# Patient Record
Sex: Female | Born: 1970
Health system: Southern US, Community
[De-identification: ages and names within clinical notes are randomized; demographics above are authoritative.]

## PROBLEM LIST (undated history)

## (undated) DIAGNOSIS — N3281 Overactive bladder: Secondary | ICD-10-CM

## (undated) DIAGNOSIS — K760 Fatty (change of) liver, not elsewhere classified: Secondary | ICD-10-CM

## (undated) DIAGNOSIS — M5136 Other intervertebral disc degeneration, lumbar region: Secondary | ICD-10-CM

## (undated) DIAGNOSIS — K449 Diaphragmatic hernia without obstruction or gangrene: Secondary | ICD-10-CM

## (undated) DIAGNOSIS — J45909 Unspecified asthma, uncomplicated: Secondary | ICD-10-CM

## (undated) DIAGNOSIS — D649 Anemia, unspecified: Secondary | ICD-10-CM

## (undated) DIAGNOSIS — M51369 Other intervertebral disc degeneration, lumbar region without mention of lumbar back pain or lower extremity pain: Secondary | ICD-10-CM

## (undated) DIAGNOSIS — G43909 Migraine, unspecified, not intractable, without status migrainosus: Secondary | ICD-10-CM

## (undated) DIAGNOSIS — Z9289 Personal history of other medical treatment: Secondary | ICD-10-CM

## (undated) DIAGNOSIS — E559 Vitamin D deficiency, unspecified: Secondary | ICD-10-CM

## (undated) DIAGNOSIS — N719 Inflammatory disease of uterus, unspecified: Secondary | ICD-10-CM

## (undated) DIAGNOSIS — F3281 Premenstrual dysphoric disorder: Secondary | ICD-10-CM

## (undated) DIAGNOSIS — M719 Bursopathy, unspecified: Secondary | ICD-10-CM

## (undated) DIAGNOSIS — T7840XA Allergy, unspecified, initial encounter: Secondary | ICD-10-CM

## (undated) DIAGNOSIS — D069 Carcinoma in situ of cervix, unspecified: Secondary | ICD-10-CM

## (undated) DIAGNOSIS — E785 Hyperlipidemia, unspecified: Secondary | ICD-10-CM

## (undated) DIAGNOSIS — N12 Tubulo-interstitial nephritis, not specified as acute or chronic: Secondary | ICD-10-CM

## (undated) DIAGNOSIS — C801 Malignant (primary) neoplasm, unspecified: Secondary | ICD-10-CM

## (undated) DIAGNOSIS — K219 Gastro-esophageal reflux disease without esophagitis: Secondary | ICD-10-CM

## (undated) DIAGNOSIS — Z973 Presence of spectacles and contact lenses: Secondary | ICD-10-CM

## (undated) DIAGNOSIS — Z01419 Encounter for gynecological examination (general) (routine) without abnormal findings: Secondary | ICD-10-CM

## (undated) HISTORY — DX: Gastro-esophageal reflux disease without esophagitis: K21.9

## (undated) HISTORY — DX: Premenstrual dysphoric disorder: F32.81

## (undated) HISTORY — DX: Fatty (change of) liver, not elsewhere classified: K76.0

## (undated) HISTORY — DX: Personal history of other medical treatment: Z92.89

## (undated) HISTORY — DX: Migraine, unspecified, not intractable, without status migrainosus: G43.909

## (undated) HISTORY — PX: LASIK: SHX215

## (undated) HISTORY — PX: FOOT SURGERY: SHX648

## (undated) HISTORY — DX: Overactive bladder: N32.81

## (undated) HISTORY — DX: Presence of spectacles and contact lenses: Z97.3

## (undated) HISTORY — DX: Anemia, unspecified: D64.9

## (undated) HISTORY — DX: Encounter for gynecological examination (general) (routine) without abnormal findings: Z01.419

## (undated) HISTORY — DX: Diaphragmatic hernia without obstruction or gangrene: K44.9

## (undated) HISTORY — PX: CHOLECYSTECTOMY: SHX55

## (undated) HISTORY — DX: Vitamin D deficiency, unspecified: E55.9

## (undated) HISTORY — PX: WISDOM TOOTH EXTRACTION: SHX21

## (undated) HISTORY — PX: LUMBAR DISC SURGERY: SHX700

## (undated) HISTORY — DX: Tubulo-interstitial nephritis, not specified as acute or chronic: N12

## (undated) HISTORY — DX: Unspecified asthma, uncomplicated: J45.909

## (undated) HISTORY — DX: Allergy, unspecified, initial encounter: T78.40XA

## (undated) HISTORY — DX: Other intervertebral disc degeneration, lumbar region without mention of lumbar back pain or lower extremity pain: M51.369

## (undated) HISTORY — DX: Inflammatory disease of uterus, unspecified: N71.9

## (undated) HISTORY — DX: Bursopathy, unspecified: M71.9

## (undated) HISTORY — DX: Hyperlipidemia, unspecified: E78.5

## (undated) HISTORY — DX: Other intervertebral disc degeneration, lumbar region: M51.36

## (undated) HISTORY — PX: NOVASURE ABLATION: SHX5394

---

## 1997-10-18 ENCOUNTER — Other Ambulatory Visit: Admission: RE | Admit: 1997-10-18 | Discharge: 1997-10-18 | Payer: Self-pay | Admitting: Obstetrics and Gynecology

## 1997-12-14 ENCOUNTER — Ambulatory Visit (HOSPITAL_COMMUNITY): Admission: RE | Admit: 1997-12-14 | Discharge: 1997-12-14 | Payer: Self-pay | Admitting: Obstetrics and Gynecology

## 1998-03-01 ENCOUNTER — Ambulatory Visit (HOSPITAL_COMMUNITY): Admission: RE | Admit: 1998-03-01 | Discharge: 1998-03-01 | Payer: Self-pay | Admitting: Obstetrics and Gynecology

## 1998-04-04 ENCOUNTER — Inpatient Hospital Stay (HOSPITAL_COMMUNITY): Admission: AD | Admit: 1998-04-04 | Discharge: 1998-04-06 | Payer: Self-pay | Admitting: Obstetrics and Gynecology

## 1998-04-06 ENCOUNTER — Inpatient Hospital Stay (HOSPITAL_COMMUNITY): Admission: AD | Admit: 1998-04-06 | Discharge: 1998-04-06 | Payer: Self-pay | Admitting: Obstetrics and Gynecology

## 1998-04-27 ENCOUNTER — Inpatient Hospital Stay (HOSPITAL_COMMUNITY): Admission: AD | Admit: 1998-04-27 | Discharge: 1998-04-27 | Payer: Self-pay

## 1998-05-01 ENCOUNTER — Inpatient Hospital Stay (HOSPITAL_COMMUNITY): Admission: AD | Admit: 1998-05-01 | Discharge: 1998-05-01 | Payer: Self-pay | Admitting: Obstetrics and Gynecology

## 1998-05-02 ENCOUNTER — Inpatient Hospital Stay (HOSPITAL_COMMUNITY): Admission: AD | Admit: 1998-05-02 | Discharge: 1998-05-02 | Payer: Self-pay | Admitting: Obstetrics and Gynecology

## 1998-05-02 ENCOUNTER — Inpatient Hospital Stay (HOSPITAL_COMMUNITY): Admission: AD | Admit: 1998-05-02 | Discharge: 1998-05-04 | Payer: Self-pay | Admitting: Obstetrics and Gynecology

## 1998-05-06 ENCOUNTER — Encounter (HOSPITAL_COMMUNITY): Admission: RE | Admit: 1998-05-06 | Discharge: 1998-08-04 | Payer: Self-pay | Admitting: Obstetrics and Gynecology

## 1999-02-06 ENCOUNTER — Ambulatory Visit (HOSPITAL_COMMUNITY): Admission: RE | Admit: 1999-02-06 | Discharge: 1999-02-06 | Payer: Self-pay | Admitting: Family Medicine

## 1999-02-07 ENCOUNTER — Encounter: Payer: Self-pay | Admitting: Family Medicine

## 1999-05-30 ENCOUNTER — Encounter: Payer: Self-pay | Admitting: Emergency Medicine

## 1999-05-30 ENCOUNTER — Emergency Department (HOSPITAL_COMMUNITY): Admission: EM | Admit: 1999-05-30 | Discharge: 1999-05-30 | Payer: Self-pay | Admitting: Emergency Medicine

## 2001-03-29 ENCOUNTER — Other Ambulatory Visit: Admission: RE | Admit: 2001-03-29 | Discharge: 2001-03-29 | Payer: Self-pay | Admitting: Obstetrics and Gynecology

## 2001-06-20 ENCOUNTER — Ambulatory Visit (HOSPITAL_COMMUNITY): Admission: RE | Admit: 2001-06-20 | Discharge: 2001-06-20 | Payer: Self-pay | Admitting: Neurological Surgery

## 2001-06-20 ENCOUNTER — Encounter: Payer: Self-pay | Admitting: Neurological Surgery

## 2001-08-15 ENCOUNTER — Ambulatory Visit (HOSPITAL_COMMUNITY): Admission: RE | Admit: 2001-08-15 | Discharge: 2001-08-15 | Payer: Self-pay | Admitting: Neurological Surgery

## 2001-08-15 ENCOUNTER — Encounter: Payer: Self-pay | Admitting: Neurological Surgery

## 2001-08-25 ENCOUNTER — Ambulatory Visit (HOSPITAL_COMMUNITY): Admission: RE | Admit: 2001-08-25 | Discharge: 2001-08-25 | Payer: Self-pay | Admitting: Neurological Surgery

## 2001-08-25 ENCOUNTER — Encounter: Payer: Self-pay | Admitting: Neurological Surgery

## 2002-01-16 ENCOUNTER — Other Ambulatory Visit: Admission: RE | Admit: 2002-01-16 | Discharge: 2002-01-16 | Payer: Self-pay | Admitting: Obstetrics and Gynecology

## 2003-01-18 ENCOUNTER — Other Ambulatory Visit: Admission: RE | Admit: 2003-01-18 | Discharge: 2003-01-18 | Payer: Self-pay | Admitting: Obstetrics and Gynecology

## 2003-07-17 ENCOUNTER — Emergency Department (HOSPITAL_COMMUNITY): Admission: EM | Admit: 2003-07-17 | Discharge: 2003-07-17 | Payer: Self-pay | Admitting: Emergency Medicine

## 2004-03-12 ENCOUNTER — Other Ambulatory Visit: Admission: RE | Admit: 2004-03-12 | Discharge: 2004-03-12 | Payer: Self-pay | Admitting: Obstetrics and Gynecology

## 2005-01-15 ENCOUNTER — Other Ambulatory Visit: Admission: RE | Admit: 2005-01-15 | Discharge: 2005-01-15 | Payer: Self-pay | Admitting: Obstetrics and Gynecology

## 2006-01-28 ENCOUNTER — Other Ambulatory Visit: Admission: RE | Admit: 2006-01-28 | Discharge: 2006-01-28 | Payer: Self-pay | Admitting: Obstetrics and Gynecology

## 2007-06-04 ENCOUNTER — Ambulatory Visit (HOSPITAL_COMMUNITY): Admission: RE | Admit: 2007-06-04 | Discharge: 2007-06-04 | Payer: Self-pay | Admitting: Neurology

## 2008-03-07 ENCOUNTER — Emergency Department (HOSPITAL_COMMUNITY): Admission: EM | Admit: 2008-03-07 | Discharge: 2008-03-07 | Payer: Self-pay | Admitting: Family Medicine

## 2008-03-17 ENCOUNTER — Ambulatory Visit (HOSPITAL_COMMUNITY): Admission: RE | Admit: 2008-03-17 | Discharge: 2008-03-17 | Payer: Self-pay | Admitting: Family Medicine

## 2008-08-07 ENCOUNTER — Emergency Department (HOSPITAL_COMMUNITY): Admission: EM | Admit: 2008-08-07 | Discharge: 2008-08-07 | Payer: Self-pay | Admitting: Emergency Medicine

## 2008-11-13 ENCOUNTER — Ambulatory Visit: Payer: Self-pay | Admitting: Sports Medicine

## 2008-11-13 DIAGNOSIS — IMO0002 Reserved for concepts with insufficient information to code with codable children: Secondary | ICD-10-CM | POA: Insufficient documentation

## 2008-11-13 DIAGNOSIS — M5137 Other intervertebral disc degeneration, lumbosacral region: Secondary | ICD-10-CM | POA: Insufficient documentation

## 2008-11-14 ENCOUNTER — Ambulatory Visit (HOSPITAL_COMMUNITY): Admission: RE | Admit: 2008-11-14 | Discharge: 2008-11-14 | Payer: Self-pay | Admitting: Sports Medicine

## 2008-11-14 ENCOUNTER — Telehealth: Payer: Self-pay | Admitting: Sports Medicine

## 2008-11-15 ENCOUNTER — Telehealth: Payer: Self-pay | Admitting: Sports Medicine

## 2008-11-27 ENCOUNTER — Encounter: Admission: RE | Admit: 2008-11-27 | Discharge: 2008-11-27 | Payer: Self-pay | Admitting: Internal Medicine

## 2008-12-10 ENCOUNTER — Emergency Department (HOSPITAL_COMMUNITY): Admission: EM | Admit: 2008-12-10 | Discharge: 2008-12-10 | Payer: Self-pay | Admitting: Family Medicine

## 2009-01-30 ENCOUNTER — Ambulatory Visit (HOSPITAL_COMMUNITY): Admission: RE | Admit: 2009-01-30 | Discharge: 2009-01-30 | Payer: Self-pay | Admitting: Family Medicine

## 2009-02-19 ENCOUNTER — Ambulatory Visit (HOSPITAL_COMMUNITY): Admission: RE | Admit: 2009-02-19 | Discharge: 2009-02-19 | Payer: Self-pay | Admitting: Gastroenterology

## 2009-02-28 ENCOUNTER — Ambulatory Visit (HOSPITAL_COMMUNITY): Admission: RE | Admit: 2009-02-28 | Discharge: 2009-02-28 | Payer: Self-pay | Admitting: Gastroenterology

## 2009-04-02 ENCOUNTER — Ambulatory Visit (HOSPITAL_COMMUNITY): Admission: RE | Admit: 2009-04-02 | Discharge: 2009-04-02 | Payer: Self-pay | Admitting: Gastroenterology

## 2009-05-27 ENCOUNTER — Ambulatory Visit (HOSPITAL_COMMUNITY): Admission: RE | Admit: 2009-05-27 | Discharge: 2009-05-27 | Payer: Self-pay | Admitting: Surgery

## 2009-06-01 HISTORY — PX: FOOT SURGERY: SHX648

## 2009-06-01 HISTORY — PX: CHOLECYSTECTOMY: SHX55

## 2009-07-11 ENCOUNTER — Ambulatory Visit (HOSPITAL_COMMUNITY): Admission: RE | Admit: 2009-07-11 | Discharge: 2009-07-11 | Payer: Self-pay | Admitting: Family Medicine

## 2009-08-09 ENCOUNTER — Ambulatory Visit (HOSPITAL_COMMUNITY): Admission: RE | Admit: 2009-08-09 | Discharge: 2009-08-09 | Payer: Self-pay | Admitting: Orthopedic Surgery

## 2009-08-30 ENCOUNTER — Emergency Department (HOSPITAL_COMMUNITY): Admission: EM | Admit: 2009-08-30 | Discharge: 2009-08-30 | Payer: Self-pay | Admitting: Family Medicine

## 2010-02-25 ENCOUNTER — Ambulatory Visit: Payer: Self-pay | Admitting: Sports Medicine

## 2010-06-01 DIAGNOSIS — E785 Hyperlipidemia, unspecified: Secondary | ICD-10-CM

## 2010-06-01 HISTORY — DX: Hyperlipidemia, unspecified: E78.5

## 2010-07-03 NOTE — Assessment & Plan Note (Signed)
Summary: BACK PAIN,MC   History of Present Illness:  Here for follow up of Low back.  Seen over a year ago.  Has DDD of L3-L5.   for past month, has had worsening R sided low back pain with mild redicular symptoms  - worse with sitting for prolonged periods  - also, biggest problem is sleeping at night.  laying down causes a lot of pain.  only getting about 4-5 hours a night.    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    - no trauma or injury at onset of this pain.   Problems Prior to Update: 1)  Degenerative Disc Disease, Lumbar Spine  (ICD-722.52) 2)  Back Pain With Radiculopathy  (ICD-729.2)  Physical Exam  Msk:     Detailed Back/Spine Exam  Lumbosacral Exam:  Inspection-deformity:    Normal Palpation-spinal tenderness:     paraspinal tenderness on R lumbar Squatting:      squatting slowed and painful.  Lying Straight Leg Raise:    Right:  negative    Left:  negative Sitting Straight Leg Raise:    Right:  negative    Left:  negative Reverse Straight Leg Raise:    Right:  negative    Left:  negative Contralateral Straight Leg Raise:    Right:  negative    Left:  negative Toe Walking:    Right:  normal    Left:  normal Heel Walking:    Right:  normal    Left:  normal Fabere Test:    Right:  positive    Left:  negative     restricted ROM on r side with decreased Faber, hamstring flexibility, internal / external rotation.   Reflexes - 2+ S1 bilaterally     Deminished / non-existant L4 (patellar) bilaterally.  slightly flicker on R knee, none on L knee.     Impression & Recommendations:  Problem # 1:  BACK PAIN WITH RADICULOPATHY (ICD-729.2) Assessment Deteriorated 40 yo F with degenerative changes of L3-L5.  New flare of Redicular back pain for past 4-6 weeks.  Patient with no weakness or incontinence.  Strenght testing is appropriate, but deminished  patellar reflexes.  Discussed surgical consideration, and patient would like to pursue further medical management.     Given her muscular restrictions, would like to refer to PT for strenghtening and flexibility of hips, back.  Tramadol for pain, and will start amitryptiline at bedtime for sleep and muscle relaxation.    we should reck after about 8 PT sessions  Complete Medication List: 1)  Tramadol Hcl 50 Mg Tabs (Tramadol hcl) .[name].. 1 by mouth qid as needed pain 2)  Amitriptyline Hcl 10 Mg Tabs (Amitriptyline hcl) .[name].. 1 by mouth at bedtime x 3 days; then 2 by mouth qhs Prescriptions: AMITRIPTYLINE HCL 10 MG TABS (AMITRIPTYLINE HCL) 1 by mouth at bedtime x 3 days; then 2 by mouth qhs  #60 x 2   Entered and Authorized by:   Enid Baas MD   Signed by:   Enid Baas MD on 02/25/2010   Method used:   Electronically to        [facility]* (retail)       [address]       [address] Shipping[address]mailing       [place], [place]  [address]       Ph: [phone]  Fax: [phone]   RxID:   [id number] TRAMADOL HCL 50 MG TABS (TRAMADOL HCL) 1 by mouth qid as needed pain  #100 x 2   Entered and Authorized by:   Enid Baas MD   Signed by:   Enid Baas MD on 02/25/2010   Method used:   Electronically to        [facility]* (retail)       [address]       [address]mail[address]       [place], [place]  [address]       Ph: [phone]       Fax: [phone]   RxID:   [id number]    Referral faxed to [facility] on [address] Rochele Pages, RN

## 2010-08-20 ENCOUNTER — Other Ambulatory Visit (HOSPITAL_COMMUNITY): Payer: Self-pay | Admitting: Orthopedic Surgery

## 2010-08-20 DIAGNOSIS — M79672 Pain in left foot: Secondary | ICD-10-CM

## 2010-08-26 ENCOUNTER — Ambulatory Visit (HOSPITAL_COMMUNITY)
Admission: RE | Admit: 2010-08-26 | Discharge: 2010-08-26 | Disposition: A | Discharge status: Home / Self Care | Payer: Commercial Managed Care - PPO | Source: Ambulatory Visit | Attending: Orthopedic Surgery | Admitting: Orthopedic Surgery

## 2010-08-26 DIAGNOSIS — M79672 Pain in left foot: Secondary | ICD-10-CM

## 2010-08-26 DIAGNOSIS — M79609 Pain in unspecified limb: Secondary | ICD-10-CM | POA: Insufficient documentation

## 2010-08-26 DIAGNOSIS — R609 Edema, unspecified: Secondary | ICD-10-CM | POA: Insufficient documentation

## 2010-09-01 LAB — DIFFERENTIAL
Basophils Absolute: 0 10*3/uL (ref 0.0–0.1)
Eosinophils Absolute: 0 10*3/uL (ref 0.0–0.7)
Eosinophils Relative: 1 % (ref 0–5)
Lymphocytes Relative: 28 % (ref 12–46)
Lymphs Abs: 1.5 10*3/uL (ref 0.7–4.0)
Monocytes Absolute: 0.6 10*3/uL (ref 0.1–1.0)
Neutro Abs: 3.3 10*3/uL (ref 1.7–7.7)
Neutrophils Relative %: 61 % (ref 43–77)

## 2010-09-01 LAB — CBC
MCHC: 34.5 g/dL (ref 30.0–36.0)
Platelets: 209 10*3/uL (ref 150–400)
RDW: 12.6 % (ref 11.5–15.5)

## 2010-09-01 LAB — COMPREHENSIVE METABOLIC PANEL
ALT: 24 U/L (ref 0–35)
Alkaline Phosphatase: 73 U/L (ref 39–117)
BUN: 9 mg/dL (ref 6–23)
GFR calc non Af Amer: >60 mL/min (ref >60)
Total Bilirubin: 0.8 mg/dL (ref 0.3–1.2)
Total Protein: 7.5 g/dL (ref 6.0–8.3)

## 2010-09-01 LAB — URINALYSIS, ROUTINE W REFLEX MICROSCOPIC
Bilirubin Urine: NEGATIVE
Glucose, UA: NEGATIVE mg/dL
Nitrite: NEGATIVE
Protein, ur: NEGATIVE mg/dL
Urobilinogen, UA: 0.2 mg/dL (ref 0.0–1.0)
pH: 6.5 (ref 5.0–8.0)

## 2010-09-01 LAB — URINE MICROSCOPIC-ADD ON

## 2010-09-23 ENCOUNTER — Encounter: Payer: Self-pay | Admitting: *Deleted

## 2010-11-12 ENCOUNTER — Ambulatory Visit (HOSPITAL_COMMUNITY)
Admission: RE | Admit: 2010-11-12 | Discharge: 2010-11-12 | Disposition: A | Discharge status: Home / Self Care | Payer: 59 | Source: Ambulatory Visit | Attending: Orthopedic Surgery | Admitting: Orthopedic Surgery

## 2010-11-12 ENCOUNTER — Encounter (HOSPITAL_COMMUNITY)
Admission: RE | Admit: 2010-11-12 | Discharge: 2010-11-12 | Disposition: A | Discharge status: Home / Self Care | Payer: 59 | Source: Ambulatory Visit | Attending: Orthopedic Surgery | Admitting: Orthopedic Surgery

## 2010-11-12 ENCOUNTER — Other Ambulatory Visit (HOSPITAL_COMMUNITY): Payer: Self-pay | Admitting: Orthopedic Surgery

## 2010-11-12 DIAGNOSIS — R059 Cough, unspecified: Secondary | ICD-10-CM | POA: Insufficient documentation

## 2010-11-12 DIAGNOSIS — Z01810 Encounter for preprocedural cardiovascular examination: Secondary | ICD-10-CM | POA: Insufficient documentation

## 2010-11-12 DIAGNOSIS — R05 Cough: Secondary | ICD-10-CM | POA: Insufficient documentation

## 2010-11-12 DIAGNOSIS — Z01812 Encounter for preprocedural laboratory examination: Secondary | ICD-10-CM | POA: Insufficient documentation

## 2010-11-12 DIAGNOSIS — Z01811 Encounter for preprocedural respiratory examination: Secondary | ICD-10-CM | POA: Insufficient documentation

## 2010-11-12 DIAGNOSIS — G5760 Lesion of plantar nerve, unspecified lower limb: Secondary | ICD-10-CM | POA: Insufficient documentation

## 2010-11-12 DIAGNOSIS — R0789 Other chest pain: Secondary | ICD-10-CM | POA: Insufficient documentation

## 2010-11-12 LAB — COMPREHENSIVE METABOLIC PANEL
ALT: 30 U/L (ref 0–35)
Albumin: 3.7 g/dL (ref 3.5–5.2)
Alkaline Phosphatase: 91 U/L (ref 39–117)
Calcium: 9.1 mg/dL (ref 8.4–10.5)
GFR calc Af Amer: >60 mL/min (ref >60)
Potassium: 4.1 mEq/L (ref 3.5–5.1)
Sodium: 138 mEq/L (ref 135–145)
Total Protein: 6.7 g/dL (ref 6.0–8.3)

## 2010-11-12 LAB — CBC
HCT: 38 % (ref 36.0–46.0)
MCH: 29.9 pg (ref 26.0–34.0)
RBC: 4.38 MIL/uL (ref 3.87–5.11)
RDW: 12.7 % (ref 11.5–15.5)

## 2010-11-12 LAB — SURGICAL PCR SCREEN: Staphylococcus aureus: POSITIVE — AB

## 2010-11-14 ENCOUNTER — Ambulatory Visit (HOSPITAL_COMMUNITY)
Admission: RE | Admit: 2010-11-14 | Discharge: 2010-11-14 | Disposition: A | Discharge status: Home / Self Care | Payer: 59 | Source: Ambulatory Visit | Attending: Orthopedic Surgery | Admitting: Orthopedic Surgery

## 2010-11-14 DIAGNOSIS — Z01812 Encounter for preprocedural laboratory examination: Secondary | ICD-10-CM | POA: Insufficient documentation

## 2010-11-14 DIAGNOSIS — Z01810 Encounter for preprocedural cardiovascular examination: Secondary | ICD-10-CM | POA: Insufficient documentation

## 2010-11-14 DIAGNOSIS — G5760 Lesion of plantar nerve, unspecified lower limb: Secondary | ICD-10-CM | POA: Insufficient documentation

## 2010-12-10 NOTE — Op Note (Signed)
  NAMESHILO, PHILIPSON NO.:  [id number]  MEDICAL RECORD NO.:  [id number]  LOCATION:  [facility]                         FACILITY:  [facility]  PHYSICIAN:  Nadara Mustard, MD     DATE OF BIRTH:  02/26/1971  DATE OF PROCEDURE:  11/14/2010 DATE OF DISCHARGE:                              OPERATIVE REPORT   PREOPERATIVE DIAGNOSIS:  Morton neuroma, left foot third webspace.  POSTOPERATIVE DIAGNOSIS:  Morton neuroma, left foot third webspace.  PROCEDURE:  Excision of Morton neuroma, left foot third webspace.  SURGEON:  Nadara Mustard, MD  ANESTHESIA:  Popliteal block.  ESTIMATED BLOOD LOSS:  Minimal.  ANTIBIOTICS:  2 g of Kefzol.  DRAINS:  None.  COMPLICATIONS:  None.  TOURNIQUET TIME:  None.  DISPOSITION:  To PACU in stable condition.  INDICATIONS FOR PROCEDURE:  The patient is a 40 year old woman with a large neuroma confirmed by MRI scan of left foot third webspace.  She has failed conservative treatment including injections, shoe wear modification, has pain with activities of daily living and presents at this time for surgical intervention.  Risks and benefits were discussed including infection, neurovascular injury, recurrent pain, need for additional surgery.  The patient states she understands and wished to proceed at this time.  DESCRIPTION OF PROCEDURE:  The patient underwent a popliteal block in the holding area and then was brought to OR room 10.  After adequate level of anesthesia was obtained, the patient's left lower extremity was prepped using DuraPrep and draped into a sterile field.  An incision was made dorsally over the third webspace.  Blunt dissection was carried down.  The inner metatarsal ligament was incised.  The neuroma was delivered to the surgical field.  This was grasped with a retractor and the proximal limbs and distal limbs were dissected out and cut.  The patient had a rather large neuroma that was 5 mm in diameter.  The  wound was irrigated with normal saline. Hemostasis was obtained.  The incision was closed using a 3-0 nylon with modified vertical mattress suture.  The wounds were covered with Adaptic, orthopedic sponges, Webril, and Coban.  The patient was then taken to PACU in stable condition.  Prescription for Vicodin for pain. Follow up in the office in 2 weeks.     Nadara Mustard, MD     MVD/MEDQ  D:  11/14/2010  T:  11/15/2010  Job:  [id number]  Electronically Signed by Aldean Baker MD on 12/10/2010 09:38:32 AM

## 2010-12-19 IMAGING — NM NM HEPATO W/GB/PHARM/[PERSON_NAME]
2 series · 7 of 7 positions shown · non-contrast
Comparison: 01/30/2009 study

CLINICAL DATA: History of abdominal pain and nausea.

NUCLEAR MEDICINE HEPATOBILIARY IMAGING WITH GALLBLADDER EF
TECHNIQUE: Sequential images of the abdomen were obtained [DATE]
minutes following intravenous administration of
radiopharmaceutical. After slow intravenous infusion of 1.8 uCg
Cholecystokinin, gallbladder ejection fraction was determined.
Radiopharmaceutical: 5 mCi 8c-YYm Choletec

[he hepatobiliary · 3.43mm/px · 6 of 54 frames shown (1 of 2)]
[frame 5/54]
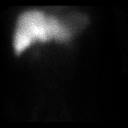
[frame 14/54]
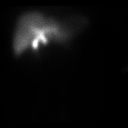
[frame 23/54]
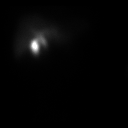
[frame 32/54]
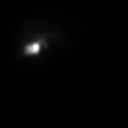
[frame 41/54]
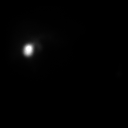
[frame 50/54]
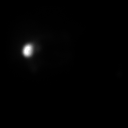

[he hepatobiliary · 1 of 1 slices shown (2 of 2)]
[im 1/1]
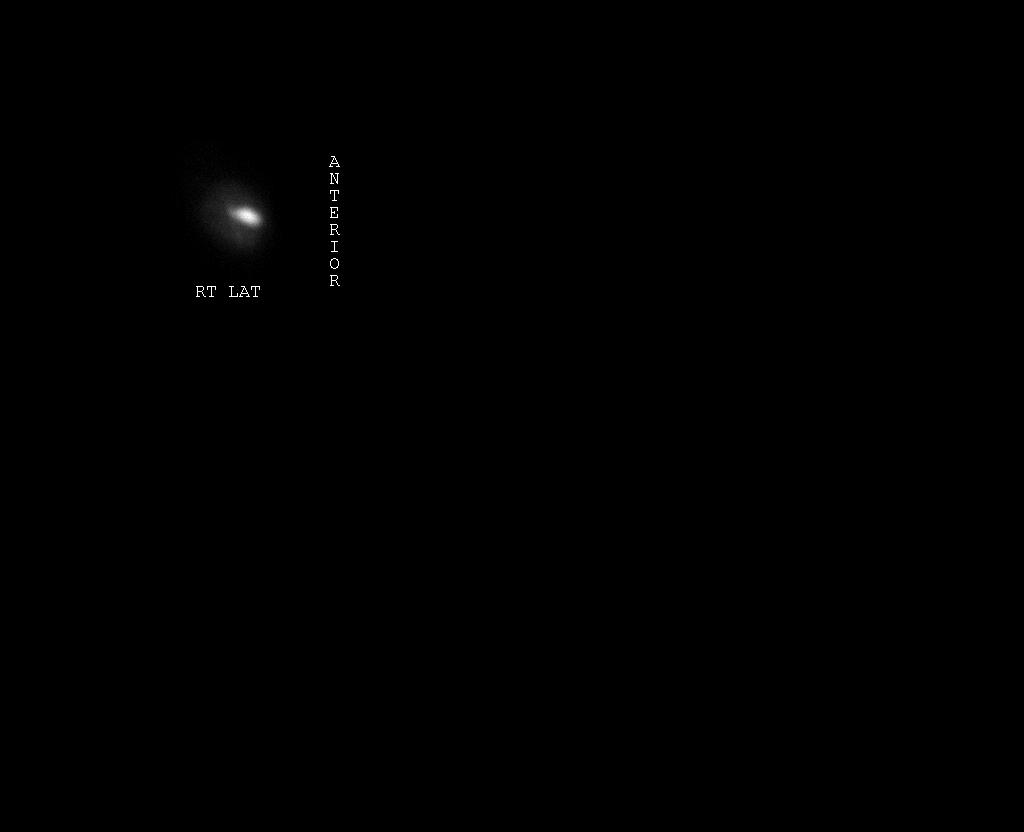

[7 of 7 positions shown; findings below may reference images not displayed]

FINDINGS: There is prompt visualization of hepatic activity. There
is prompt visualization of the common bile duct. Subsequently
intestinal activity was identified.

The gallbladder began to visualize at 15 minutes.

During CCK infusion the gallbladder contracted 15%. 30% or greater
is normal range.

During CCK infusion the patient reported upper abdominal pain.
IMPRESSION: There is demonstration of patency of the common bile duct and the
cystic duct. There is no evidence of cholecystitis.

During CCK infusion the gallbladder contracted 15%.  This is
abnormally low. 30% or greater is normal range.

During CCK infusion the patient reported upper abdominal pain.

## 2010-12-28 IMAGING — CR DG UGI W/ SMALL BOWEL HIGH DENSITY
1 series · 1 of 1 positions shown · IV contrast (agent unspecified)
Comparison: None

CLINICAL DATA: History given of right upper quadrant abdominal pain.
History given of pain in the mid back.  History given of nausea,
vomiting, constipation, and diarrhea.

UPPER GI W/ SMALL BOWEL HIGH DENSITY
TECHNIQUE: Upper GI series performed with high density barium and
effervescent agent. Thin barium also used. Subsequently, serial
images of the small bowel were obtained including spot views of the
terminal ileum.
Fluoroscopy Time: 2.5 minutes
Contrast: Described under technique above .

[t abdomen supine]
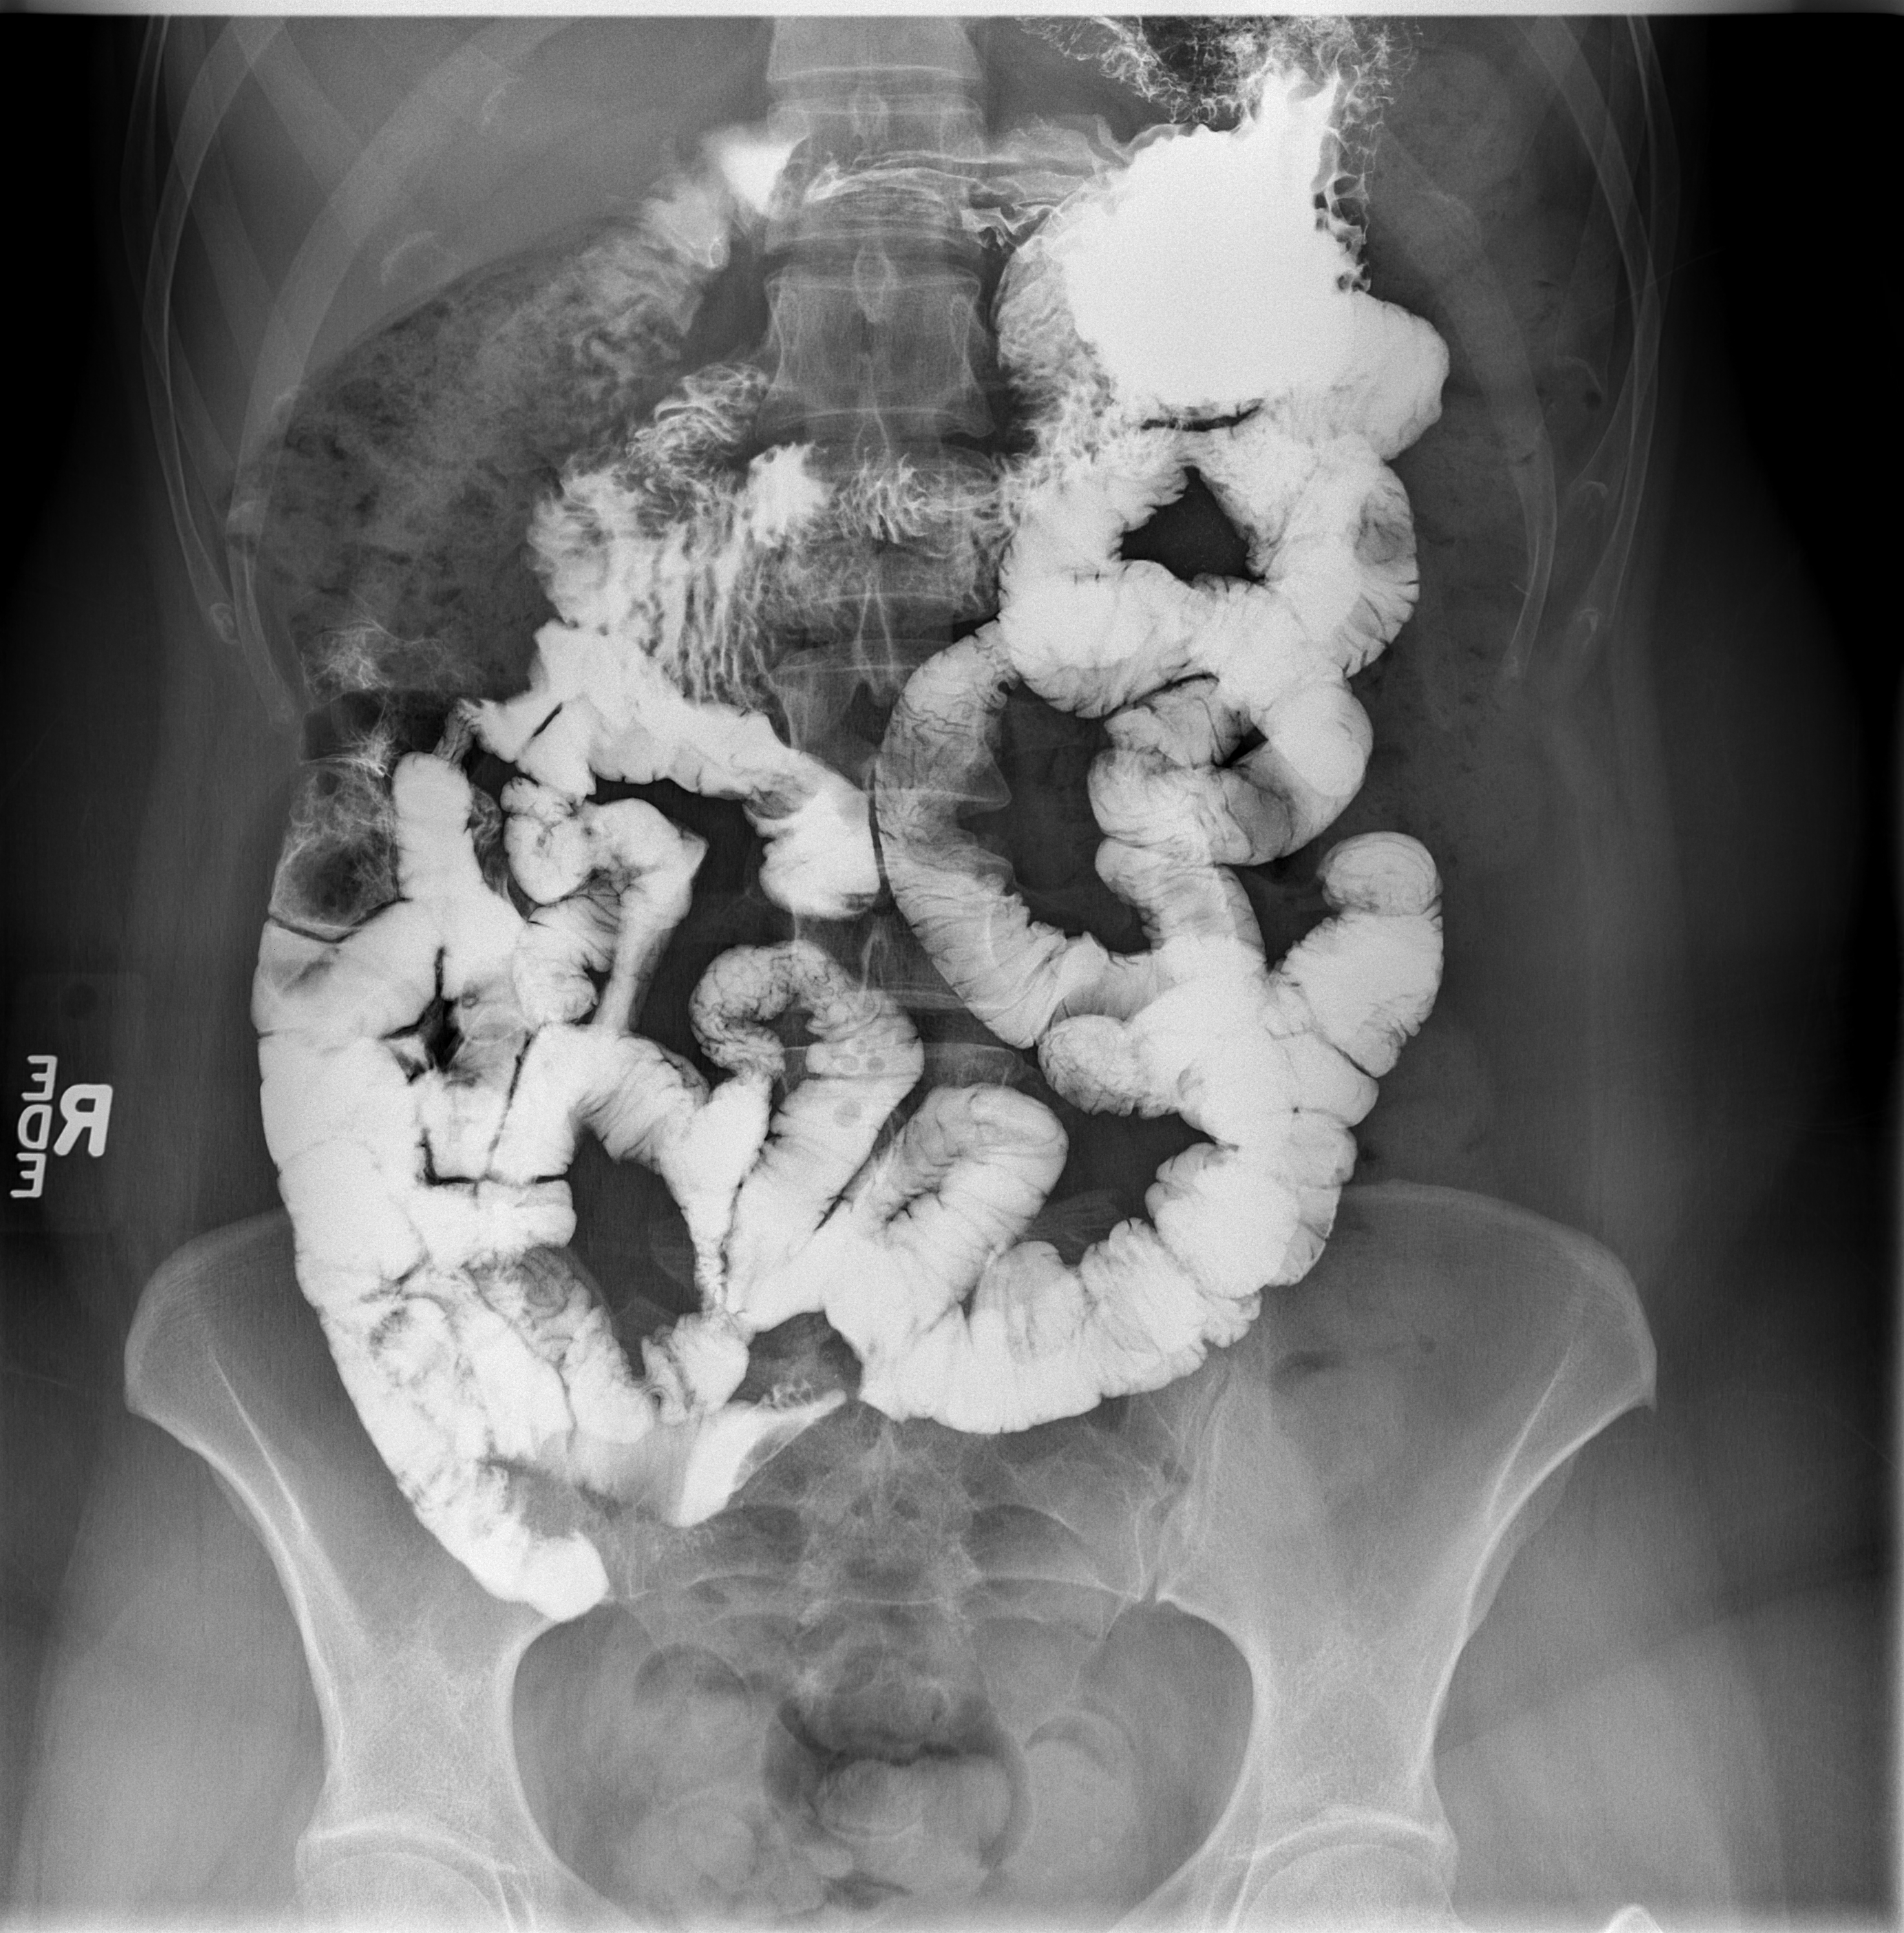

[1 of 1 positions shown; findings below may reference images not displayed]

FINDINGS: The patient had no difficulty initiating swallowing.  No
aspiration occurred.  Esophageal motility appeared normal.  No
esophageal mass, obstruction, hiatal hernia, stricture, reflux, or
mucosal changes of esophagitis were evident.

Stomach and duodenum appeared normal.  No mass, ulceration,
obstruction, or other lesion was seen.

Small bowel transit time was relatively rapid with contrast
reaching the colon at 30 minutes.  Small bowel mucosa appeared
normal.  No small-bowel mass, obstruction, fistula, inflammation,
or other lesion was seen.  The terminal ileum appeared normal.
IMPRESSION: Normal upper GI series.  Rapid small bowel transit time at 30
minutes.  Normal appearance of small bowel.  Normal appearance of
terminal ileum.

## 2011-02-20 ENCOUNTER — Other Ambulatory Visit: Payer: Self-pay | Admitting: Gastroenterology

## 2011-02-20 ENCOUNTER — Ambulatory Visit (HOSPITAL_COMMUNITY)
Admission: RE | Admit: 2011-02-20 | Discharge: 2011-02-20 | Disposition: A | Discharge status: Home / Self Care | Payer: 59 | Source: Ambulatory Visit | Attending: Gastroenterology | Admitting: Gastroenterology

## 2011-02-20 DIAGNOSIS — K449 Diaphragmatic hernia without obstruction or gangrene: Secondary | ICD-10-CM | POA: Insufficient documentation

## 2011-02-20 DIAGNOSIS — R198 Other specified symptoms and signs involving the digestive system and abdomen: Secondary | ICD-10-CM | POA: Insufficient documentation

## 2011-03-03 LAB — POCT RAPID STREP A: Streptococcus, Group A Screen (Direct): NEGATIVE

## 2011-03-24 NOTE — Op Note (Signed)
  NAMEALANNA, STORTI NO.:  [id number]  MEDICAL RECORD NO.:  [id number]  LOCATION:  [facility]                         FACILITY:  [facility]  PHYSICIAN:  Petra Kuba, M.D.    DATE OF BIRTH:  [birth year]  DATE OF PROCEDURE:  02/20/2011 DATE OF DISCHARGE:                              OPERATIVE REPORT   PROCEDURE:  Esophagogastroduodenoscopy with biopsy.  INDICATION:  Upper tract symptoms, not responding to the usual medicines.  Consent was signed.  After risks, benefits, methods, options thoroughly discussed prior to any sedation and in the office multiple times.  MEDICINES USED:  Fentanyl 100 mcg, Versed 10 mg.  PROCEDURE:  Video endoscope was inserted by direct vision.  The proximal midesophagus was normal and distal esophagus with a small hiatal hernia, very minimal reflux, inflammation and possibly one very short segment of Barrett's, which was biopsied as was the distal esophagus just above the GE junction.  The scope was inserted into the stomach and advanced through a normal antrum, normal pylorus into a normal duodenal bulb and around the C-loop to a normal second portion of the duodenum.  Scope was withdrawn back to the bulb and a good look there ruled out abnormalities in that locations.  Scope was withdrawn back to the stomach and retroflexed.  High in the cardia, the hiatal hernia was confirmed.  The fundus, angularis, lesser and greater curve were normal on retroflex visualization.  Straight visualization of the stomach did not reveal any additional findings.  Scope was then slowly withdrawn, confirming above esophageal findings.  The biopsies at GE junction were obtained at this time.  Air was suctioned.  Scope slowly withdrawn.  Again, a good look at the remainder of the esophagus was normal.  Scope was removed.  The patient tolerated the procedure adequately.  There was no obvious immediate complication.  ENDOSCOPIC DIAGNOSES: 1. Small hiatal  hernia. 2. Minimal distal esophagitis and questionably one very short segment     of Barrett's status post biopsy. 3. Otherwise normal esophagogastroduodenoscopy.  PLAN:  Await pathology and increase pump inhibitors to b.i.d.  Call p.r.n., otherwise follow up in 6 weeks to recheck symptoms and make sure no further workup plans are needed.          ______________________________ Petra Kuba, M.D.     MEM/MEDQ  D:  02/20/2011  T:  02/20/2011  Job:  [id number]  Electronically Signed by Vida Rigger M.D. on 03/24/2011 02:25:52 PM

## 2012-06-01 HISTORY — PX: ESOPHAGOGASTRODUODENOSCOPY: SHX1529

## 2012-06-01 HISTORY — PX: COLONOSCOPY: SHX174

## 2012-08-05 ENCOUNTER — Other Ambulatory Visit: Payer: Self-pay | Admitting: Gastroenterology

## 2012-11-03 ENCOUNTER — Encounter: Payer: Self-pay | Admitting: *Deleted

## 2012-11-03 NOTE — Telephone Encounter (Signed)
This encounter was created in error - please disregard.

## 2012-12-14 ENCOUNTER — Ambulatory Visit (HOSPITAL_COMMUNITY)
Admission: RE | Admit: 2012-12-14 | Discharge: 2012-12-14 | Disposition: A | Discharge status: Home / Self Care | Payer: BC Managed Care – PPO | Source: Ambulatory Visit | Attending: Physician Assistant | Admitting: Physician Assistant

## 2012-12-14 ENCOUNTER — Other Ambulatory Visit (HOSPITAL_COMMUNITY): Payer: Self-pay | Admitting: Physician Assistant

## 2012-12-14 DIAGNOSIS — M546 Pain in thoracic spine: Secondary | ICD-10-CM

## 2012-12-14 DIAGNOSIS — R079 Chest pain, unspecified: Secondary | ICD-10-CM | POA: Insufficient documentation

## 2013-01-24 ENCOUNTER — Other Ambulatory Visit (HOSPITAL_COMMUNITY): Payer: Self-pay | Admitting: Obstetrics and Gynecology

## 2013-01-24 ENCOUNTER — Other Ambulatory Visit (HOSPITAL_COMMUNITY): Payer: Self-pay | Admitting: Nurse Practitioner

## 2013-01-24 DIAGNOSIS — Z1231 Encounter for screening mammogram for malignant neoplasm of breast: Secondary | ICD-10-CM

## 2013-01-25 ENCOUNTER — Ambulatory Visit (HOSPITAL_COMMUNITY)
Admission: RE | Admit: 2013-01-25 | Discharge: 2013-01-25 | Disposition: A | Discharge status: Home / Self Care | Payer: BC Managed Care – PPO | Source: Ambulatory Visit | Attending: Nurse Practitioner | Admitting: Nurse Practitioner

## 2013-01-25 DIAGNOSIS — Z1231 Encounter for screening mammogram for malignant neoplasm of breast: Secondary | ICD-10-CM | POA: Insufficient documentation

## 2013-02-11 ENCOUNTER — Ambulatory Visit (INDEPENDENT_AMBULATORY_CARE_PROVIDER_SITE_OTHER): Payer: BC Managed Care – PPO | Admitting: Emergency Medicine

## 2013-02-11 VITALS — BP 128/80 | HR 106 | Temp 99.6°F | Resp 17 | Ht 70.0 in | Wt 226.0 lb

## 2013-02-11 DIAGNOSIS — J0100 Acute maxillary sinusitis, unspecified: Secondary | ICD-10-CM

## 2013-02-11 MED ORDER — FLUCONAZOLE 150 MG PO TABS: 150.0000 mg | ORAL_TABLET | Freq: Once | ORAL | Status: DC

## 2013-02-11 MED ORDER — AMOXICILLIN-POT CLAVULANATE ER 1000-62.5 MG PO TB12: 2.0000 | ORAL_TABLET | Freq: Two times a day (BID) | ORAL | Status: DC

## 2013-02-11 MED ORDER — PSEUDOEPHEDRINE-GUAIFENESIN ER 60-600 MG PO TB12: 1.0000 | ORAL_TABLET | Freq: Two times a day (BID) | ORAL | Status: DC

## 2013-02-11 NOTE — Addendum Note (Signed)
Addended by: Eddie Candle on: 02/11/2013 05:35 PM   Modules accepted: Orders

## 2013-02-11 NOTE — Patient Instructions (Signed)

## 2013-02-11 NOTE — Progress Notes (Signed)
[facility] [address] [place] [address] [phone]  Date:  02/11/2013   Name:  Michelle Griffin   DOB:  December 24, 1970   MRN:  [id number]  PCP:  Bobbie Stack, MD    Chief Complaint: Sinusitis   History of Present Illness:  Michelle Griffin is a 42 y.o. very pleasant female patient who presents with the following:  Ill with purulent nasal drainage and post nasal drip.  No fever or chills. No wheezing or shortness of breath.  No cough, nausea or vomiting.  Pain behind left eye.  No improvement with over the counter medications or other home remedies. Denies other complaint or health concern today.   Patient Active Problem List   Diagnosis Date Noted  . DEGENERATIVE DISC DISEASE, LUMBAR SPINE 11/13/2008  . BACK PAIN WITH RADICULOPATHY 11/13/2008    No past medical history on file.  No past surgical history on file.  History  Substance Use Topics  . Smoking status: Never Smoker   . Smokeless tobacco: Not on file  . Alcohol Use: Not on file    No family history on file.  Allergies  Allergen Reactions  . Sulfa Antibiotics Hives    Medication list has been reviewed and updated.  Current Outpatient Prescriptions on File Prior to Visit  Medication Sig Dispense Refill  . amitriptyline (ELAVIL) 10 MG tablet Take 10 mg by mouth. At bedtime x 3 days; then 2 by mouth qhs       . traMADol (ULTRAM) 50 MG tablet Take 50 mg by mouth. QID as needed pain        No current facility-administered medications on file prior to visit.    Review of Systems:  As per HPI, otherwise negative.    Physical Examination: Filed Vitals:   02/11/13 1536  BP: 128/80  Pulse: 106  Temp: 99.6 F (37.6 C)  Resp: 17   Filed Vitals:   02/11/13 1536  Height: 5\' 10"  (1.778 m)  Weight: 226 lb (102.513 kg)   Body mass index is 32.43 kg/(m^2). Ideal Body Weight: Weight in (lb) to have BMI = 25: 173.9  GEN: WDWN, NAD, Non-toxic, A & O x 3 HEENT: Atraumatic, Normocephalic.  Neck supple. No masses, No LAD. Ears and Nose: No external deformity.  Purulent nasal drainage CV: RRR, No M/G/R. No JVD. No thrill. No extra heart sounds. PULM: CTA B, no wheezes, crackles, rhonchi. No retractions. No resp. distress. No accessory muscle use. ABD: S, NT, ND, +BS. No rebound. No HSM. EXTR: No c/c/e NEURO Normal gait.  PSYCH: Normally interactive. Conversant. Not depressed or anxious appearing.  Calm demeanor.    Assessment and Plan: Maxillary sinusitis mucinex d augmentin Continue nasonex   Signed,  Phillips Odor, MD

## 2013-03-01 DIAGNOSIS — Z9289 Personal history of other medical treatment: Secondary | ICD-10-CM

## 2013-03-01 HISTORY — DX: Personal history of other medical treatment: Z92.89

## 2013-03-16 ENCOUNTER — Other Ambulatory Visit: Payer: Self-pay | Admitting: Emergency Medicine

## 2013-06-01 DIAGNOSIS — N719 Inflammatory disease of uterus, unspecified: Secondary | ICD-10-CM

## 2013-06-01 HISTORY — PX: NOVASURE ABLATION: SHX5394

## 2013-06-01 HISTORY — DX: Inflammatory disease of uterus, unspecified: N71.9

## 2013-06-09 ENCOUNTER — Encounter: Payer: Self-pay | Admitting: Medical

## 2013-06-09 ENCOUNTER — Ambulatory Visit (INDEPENDENT_AMBULATORY_CARE_PROVIDER_SITE_OTHER): Payer: BC Managed Care – PPO | Admitting: Medical

## 2013-06-09 VITALS — BP 118/80 | HR 82 | Temp 98.0°F | Resp 18 | Wt 230.0 lb

## 2013-06-09 DIAGNOSIS — Z20828 Contact with and (suspected) exposure to other viral communicable diseases: Secondary | ICD-10-CM

## 2013-06-09 DIAGNOSIS — R05 Cough: Secondary | ICD-10-CM

## 2013-06-09 DIAGNOSIS — R059 Cough, unspecified: Secondary | ICD-10-CM

## 2013-06-09 DIAGNOSIS — Z79899 Other long term (current) drug therapy: Secondary | ICD-10-CM

## 2013-06-09 DIAGNOSIS — J029 Acute pharyngitis, unspecified: Secondary | ICD-10-CM

## 2013-06-09 DIAGNOSIS — R51 Headache: Secondary | ICD-10-CM

## 2013-06-09 LAB — POC INFLUENZA A&B (BINAX/QUICKVUE)
INFLUENZA A, POC: NEGATIVE
Influenza B, POC: NEGATIVE

## 2013-06-09 LAB — POCT RAPID STREP A (OFFICE): Rapid Strep A Screen: NEGATIVE

## 2013-06-09 MED ORDER — OSELTAMIVIR PHOSPHATE 75 MG PO CAPS: 75.0000 mg | ORAL_CAPSULE | Freq: Two times a day (BID) | ORAL | Status: DC

## 2013-06-09 MED ORDER — VENLAFAXINE HCL 75 MG PO TABS: 75.0000 mg | ORAL_TABLET | Freq: Two times a day (BID) | ORAL | Status: DC

## 2013-06-09 NOTE — Patient Instructions (Signed)
Take Effexor once daily every other day for a week, then every third day for a week, then twice a week then stop.  Begin [place], rest, hydrate well, call or return if worse or not improving.   Influenza, Adult Influenza ("the flu") is a viral infection of the respiratory tract. It causes chills, fever, cough, headache, body aches, and sore throat. Influenza in general will make you feel sicker than when you have a common cold. Symptoms of the illness typically last a few days. Cough and fatigue may continue for as long as 7 to 10 days. Influenza is highly contagious. It spreads easily to others in the droplets from coughs and sneezes. People frequently become infected by touching something that was recently contaminated with the virus and then touch their mouth, nose or eyes. This infection is caused by a virus. Symptoms will not be reduced or improved by taking an antibiotic. Antibiotics are medications that kill bacteria, not viruses. DIAGNOSIS  Diagnosis of influenza is often made based on the history and physical examination as well as the presence of influenza reports occurring in your community. Testing can be done if the diagnosis is not certain. TREATMENT  Since influenza is caused by a virus, antibiotics are not helpful. Your caregiver may prescribe antiviral medicines to shorten the illness and lessen the severity. Your caregiver may also recommend influenza vaccination and/or antiviral medicines for your family members in order to prevent the spread of influenza to them. HOME CARE INSTRUCTIONS  DO NOT GIVE ASPIRIN TO PERSONS WITH INFLUENZA WHO ARE UNDER 36 YEARS OF AGE. This could lead to brain and liver damage (Reye's syndrome). Read the label on over-the-counter medicines.   Stay home from work or school if at all possible until most of your symptoms are gone.   Only take over-the-counter or prescription medicines for pain, discomfort, or fever as directed by your caregiver.   Use a  cool mist humidifier to increase air moisture. This will make breathing easier.   Rest until your temperature is nearly normal: 98.6 F (37 C). This usually takes 3 to 4 days. Be sure you get plenty of rest.   Drink at least eight, eight-ounce glasses of fluids per day. Fluids include water, juice, broth, gelatin, or lemonade.   Cover your mouth and nose when coughing or sneezing and wash your hands often to prevent the spread of this virus to other persons.  PREVENTION  Annual influenza vaccination (flu shots) is the best way to avoid getting influenza. An annual flu shot is now routinely recommended for all adults in the [place] IF:   You develop shortness of breath while resting.   You have a deep cough with production of mucous or chest pain.   You develop nausea (feeling sick to your stomach), vomiting, or diarrhea.  SEEK IMMEDIATE MEDICAL CARE IF:   You have difficulty breathing, become short of breath, or your skin or nails turn bluish.   You develop severe neck pain or stiffness.   You develop a severe headache, facial pain, or earache.   You have a fever.   You develop nausea or vomiting that cannot be controlled.  Document Released: 05/15/2000 Document Revised: 01/28/2011 Document Reviewed: 03/20/2009 [facility] Patient Information 2012 [place].

## 2013-06-09 NOTE — Progress Notes (Signed)
Subjective: Here as a new patient today.  accompanied by her husband today who is a patient here.  Started feeling bad yesterday with cough, hot and cold feeling, headache, sore throat, yellow sputum with cough, sneezing, head congestion.  Feels bad all over.  Has had some effexor Monday.   No fever, no chills.   No NVD.  Does have + flu contact.      Was on Effexor daily for PMS per prior gynecologist. However her NP left that practice, and the other provider they gave her to see wouldn't refill the effexor, deferred to primary care, thus she ran out of it this week.  Past Medical History  Diagnosis Date  . Allergy   . Asthma    ROS as in subjective   Objective: BP 118/80  Pulse 82  Temp(Src) 98 F (36.7 C) (Oral)  Resp 18  Wt 230 lb (104.327 kg)   General: Ill-appearing, well-developed, well-nourished Skin: Hot, dry HEENT: Nose inflamed and congested, clear conjunctiva, TMs pearly, no sinus tenderness, pharynx with erythema, no exudates Neck: Supple, nontender, shotty cervical adenopathy Heart: Regular rate and rhythm, normal S1, S2, no murmurs Lungs: Clear to auscultation bilaterally, no wheezes, rales, rhonchi Abdomen: Nontender non distended Extremities: Mild generalized tenderness   Assessment and Plan: Encounter Diagnoses  Name Primary?  . Cough Yes  . Sore throat   . Exposure to influenza   . Headache(784.0)   . Encounter for long-term (current) use of other medications    Given her symptoms and exam, likely influenza.    Discussed diagnosis of influenza.  prescription given for Tamiflu, discussed risks/benefits of medication.  Discussed supportive care including rest, hydration, OTC Tylenol or NSAID for fever, aches, and malaise.  Discussed period of contagion, self quarantine at home away from others to avoid spread of disease, discussed means of transmission, and possible complications including pneumonia.  If worse or not improving within the next 4-5 days, then  call or return.  Gave note for work.   She was trying to wean off Effexor but given miscommunication at her prior office, ran out of Effexor abruptly. Advised she shouldn't stop Tamiflu cold [place].   Take Effexor once daily every other day for a week, then every third day for a week, then twice a week then stop.

## 2013-06-09 NOTE — Addendum Note (Signed)
Addended by: Armanda Magic on: 06/09/2013 03:46 PM   Modules accepted: Orders

## 2013-06-28 ENCOUNTER — Other Ambulatory Visit (HOSPITAL_COMMUNITY): Payer: Self-pay | Admitting: Physical Medicine and Rehabilitation

## 2013-06-28 ENCOUNTER — Other Ambulatory Visit (HOSPITAL_COMMUNITY): Payer: Self-pay | Admitting: Orthopedic Surgery

## 2013-06-28 ENCOUNTER — Ambulatory Visit: Payer: BC Managed Care – PPO | Admitting: Medical

## 2013-06-28 DIAGNOSIS — M5416 Radiculopathy, lumbar region: Secondary | ICD-10-CM

## 2013-06-29 ENCOUNTER — Ambulatory Visit (HOSPITAL_COMMUNITY)
Admission: RE | Admit: 2013-06-29 | Discharge: 2013-06-29 | Disposition: A | Discharge status: Home / Self Care | Payer: BC Managed Care – PPO | Source: Ambulatory Visit | Attending: Physical Medicine and Rehabilitation | Admitting: Physical Medicine and Rehabilitation

## 2013-06-30 ENCOUNTER — Ambulatory Visit (HOSPITAL_COMMUNITY)
Admit: 2013-06-30 | Discharge: 2013-06-30 | Disposition: A | Discharge status: Home / Self Care | Payer: BC Managed Care – PPO | Attending: Physical Medicine and Rehabilitation | Admitting: Physical Medicine and Rehabilitation

## 2013-06-30 ENCOUNTER — Emergency Department (HOSPITAL_COMMUNITY)
Admission: EM | Admit: 2013-06-30 | Discharge: 2013-06-30 | Disposition: A | Discharge status: Home / Self Care | Payer: BC Managed Care – PPO | Attending: Emergency Medicine | Admitting: Emergency Medicine

## 2013-06-30 ENCOUNTER — Encounter (HOSPITAL_COMMUNITY): Payer: Self-pay | Admitting: Emergency Medicine

## 2013-06-30 DIAGNOSIS — IMO0002 Reserved for concepts with insufficient information to code with codable children: Secondary | ICD-10-CM | POA: Insufficient documentation

## 2013-06-30 DIAGNOSIS — M5416 Radiculopathy, lumbar region: Secondary | ICD-10-CM

## 2013-06-30 DIAGNOSIS — R110 Nausea: Secondary | ICD-10-CM | POA: Insufficient documentation

## 2013-06-30 DIAGNOSIS — M5126 Other intervertebral disc displacement, lumbar region: Secondary | ICD-10-CM | POA: Insufficient documentation

## 2013-06-30 DIAGNOSIS — J45909 Unspecified asthma, uncomplicated: Secondary | ICD-10-CM | POA: Insufficient documentation

## 2013-06-30 DIAGNOSIS — M549 Dorsalgia, unspecified: Secondary | ICD-10-CM

## 2013-06-30 DIAGNOSIS — Z79899 Other long term (current) drug therapy: Secondary | ICD-10-CM | POA: Insufficient documentation

## 2013-06-30 MED ORDER — KETOROLAC TROMETHAMINE 15 MG/ML IJ SOLN
15.0000 mg | Freq: Once | INTRAMUSCULAR | Status: AC
  Administered 2013-06-30: 15 mg via INTRAMUSCULAR
  Filled 2013-06-30: qty 1

## 2013-06-30 MED ORDER — METHOCARBAMOL 500 MG PO TABS: 500.0000 mg | ORAL_TABLET | Freq: Two times a day (BID) | ORAL | Status: DC

## 2013-06-30 MED ORDER — OXYCODONE-ACETAMINOPHEN 5-325 MG PO TABS: 2.0000 | ORAL_TABLET | Freq: Four times a day (QID) | ORAL | Status: DC | PRN

## 2013-06-30 MED ORDER — OXYCODONE-ACETAMINOPHEN 5-325 MG PO TABS
2.0000 | ORAL_TABLET | Freq: Once | ORAL | Status: AC
  Administered 2013-06-30: 2 via ORAL
  Filled 2013-06-30: qty 2

## 2013-06-30 MED ORDER — DIAZEPAM 5 MG PO TABS
5.0000 mg | ORAL_TABLET | Freq: Once | ORAL | Status: AC
  Administered 2013-06-30: 5 mg via ORAL
  Filled 2013-06-30: qty 1

## 2013-06-30 NOTE — ED Provider Notes (Signed)
CSN: [id number]     Arrival date & time 06/30/13  0912 History  This chart was scribed for non-physician practitioner, Montine Circle, PA-C working with Varney Biles, MD by Frederich Balding, ED scribe. This patient was seen in room TR09C/TR09C and the patient's care was started at 9:43 AM.   Chief Complaint  Patient presents with  . Back Pain   The history is provided by the patient. No language interpreter was used.   HPI Comments: Michelle Griffin is a 43 y.o. female who presents to the Emergency Department complaining of worsening back pain that started 2 days ago after she sneezed. This is not a new problem, this it the 3rd time she has had it "flare up." The pain radiates into both legs and she has some subjective numbness in her left foot. She states she is also having nausea due to pain. Pt was seen by ortho and prescribed prednisone, robaxin and tramadol with temporary relief. She has an MRI scheduled for tomorrow. Denies fever, chills, emesis, bowel or bladder incontinence.   Past Medical History  Diagnosis Date  . Allergy   . Asthma    Past Surgical History  Procedure Laterality Date  . Cholecystectomy    . Foot surgery Left    No family history on file. History  Substance Use Topics  . Smoking status: Never Smoker   . Smokeless tobacco: Not on file  . Alcohol Use: Yes   OB History   Grav Para Term Preterm Abortions TAB SAB Ect Mult Living                 Review of Systems  Constitutional: Negative for fever and chills.  Gastrointestinal: Positive for nausea. Negative for vomiting.  Genitourinary:       Negative for bowel or bladder incontinence.  Musculoskeletal: Positive for back pain and myalgias.  Neurological: Positive for numbness.   Allergies  Sulfa antibiotics  Home Medications   Current Outpatient Rx  Name  Route  Sig  Dispense  Refill  . esomeprazole (NEXIUM) 40 MG capsule   Oral   Take 40 mg by mouth daily before breakfast.         .  levocetirizine (XYZAL) 5 MG tablet   Oral   Take 5 mg by mouth every evening.         . loratadine (CLARITIN) 10 MG tablet   Oral   Take 10 mg by mouth daily.         . montelukast (SINGULAIR) 10 MG tablet   Oral   Take 10 mg by mouth at bedtime.         [name] oseltamivir (TAMIFLU) 75 MG capsule   Oral   Take 1 capsule (75 mg total) by mouth 2 (two) times daily.   10 capsule   0   . pravastatin (PRAVACHOL) 40 MG tablet   Oral   Take 40 mg by mouth daily.         [name] tolterodine (DETROL) 2 MG tablet   Oral   Take 2 mg by mouth 2 (two) times daily.         [name] venlafaxine (EFFEXOR) 75 MG tablet   Oral   Take 1 tablet (75 mg total) by mouth 2 (two) times daily.   20 tablet   0    BP 138/84  Pulse 102  Temp(Src) 97.9 F (36.6 C) (Oral)  Resp 20  Ht 5\' 10"  (1.778 m)  Wt 200 lb (90.719 kg)  BMI 28.70 kg/m2  SpO2 100%  LMP 06/09/2013  Physical Exam  Nursing note and vitals reviewed. Constitutional: She is oriented to person, place, and time. She appears well-developed and well-nourished. No distress.  HENT:  Head: Normocephalic and atraumatic.  Eyes: Conjunctivae and EOM are normal. Right eye exhibits no discharge. Left eye exhibits no discharge. No scleral icterus.  Neck: Normal range of motion. Neck supple. No tracheal deviation present.  Cardiovascular: Normal rate, regular rhythm and normal heart sounds.  Exam reveals no gallop and no friction rub.   No murmur heard. Pulmonary/Chest: Effort normal and breath sounds normal. No stridor. No respiratory distress. She has no wheezes.  Abdominal: Soft. She exhibits no distension. There is no tenderness.  Musculoskeletal: Normal range of motion. She exhibits no edema.  Lumbar paraspinal muscles tender to palpation, no bony tenderness, step-offs, or gross abnormality or deformity of spine, patient is able to ambulate, moves all extremities  Bilateral great toe extension intact Bilateral plantar/dorsiflexion intact   Neurological: She is alert and oriented to person, place, and time. She has normal reflexes. No cranial nerve deficit.  Sensation and strength intact bilaterally Symmetrical reflexes  Skin: Skin is warm and dry. She is not diaphoretic.  Psychiatric: She has a normal mood and affect. Her behavior is normal. Judgment and thought content normal.    ED Course  Procedures (including critical care time)  DIAGNOSTIC STUDIES: Oxygen Saturation is 100% on RA, normal by my interpretation.    COORDINATION OF CARE: 9:49 AM-Discussed treatment plan which includes pain medication with pt at bedside and pt agreed to plan.   Labs Review Labs Reviewed - No data to display Imaging Review No results found.  EKG Interpretation   None       MDM   1. Back pain   2. Lumbar radiculopathy    Patient with back pain.  No neurological deficits and normal neuro exam.  Patient can walk but states is painful.  No loss of bowel or bladder control.  No concern for cauda equina.  No fever, night sweats, weight loss, h/o cancer, IVDU.  RICE protocol and pain medicine indicated and discussed with patient.    Patient with back pain. Radiates to left leg, with subjective numbness and tingling of the left leg. This is subjective only. Strength and range of motion is 5/5. She is able to ambulate. Reflexes are normal. No signs of cauda equina. This is not a new problem. She's had this several times in the past.  Patient is requesting that an MRI be done today. I discussed this with Dr. Kathrynn Humble, who states that in the absence of any neurologic deficit or signs of cauda equina, she can followup tomorrow morning and have her MRI done on an outpatient basis tomorrow. She has friends work and MRI, and has called them. They say that they're able to get her into today. I will discharge her from the ED, and send her to radiology for the MRI. Patient understands and agrees with the plan. Return precautions are given. Patient  is stable and ready for discharge.   Patient asks if she will get the results back today, I told her that I was uncertain, but that I would watch for them and call if they come back.  I personally performed the services described in this documentation, which was scribed in my presence. The recorded information has been reviewed and is accurate.  1:54 PM Reviewed the MRI results.  No central cord compression.  MRI  results are unchanged from prior in 2011.  I called the patient and left a message informing her of these results.  Encouraged her to follow-up with her orthopedic/spine doctor.  Montine Circle, PA-C 06/30/13 [place], PA-C 06/30/13 1356

## 2013-06-30 NOTE — ED Notes (Signed)
Pt standing in room, crying. States she started 2 days ago with severe lower back pain after sneezing. Went to orthopedist, put on Prednisone and Ultram. States started today with "severe burning" down left leg. Hx of DDD.

## 2013-06-30 NOTE — ED Notes (Signed)
Pt c/o back pain that occurred when she sneezed on Wednesday. Pt seen by ortho Dr and given prescribed medications without relief. Pt has MRI scheduled for tomorrow.

## 2013-06-30 NOTE — Discharge Instructions (Signed)
Back Pain, Adult Low back pain is very common. About 1 in 5 people have back pain.The cause of low back pain is rarely dangerous. The pain often gets better over time.About half of people with a sudden onset of back pain feel better in just 2 weeks. About 8 in 10 people feel better by 6 weeks.  CAUSES Some common causes of back pain include:  Strain of the muscles or ligaments supporting the spine.  Wear and tear (degeneration) of the spinal discs.  Arthritis.  Direct injury to the back. DIAGNOSIS Most of the time, the direct cause of low back pain is not known.However, back pain can be treated effectively even when the exact cause of the pain is unknown.Answering your caregiver's questions about your overall health and symptoms is one of the most accurate ways to make sure the cause of your pain is not dangerous. If your caregiver needs more information, he or she may order lab work or imaging tests (X-rays or MRIs).However, even if imaging tests show changes in your back, this usually does not require surgery. HOME CARE INSTRUCTIONS For many people, back pain returns.Since low back pain is rarely dangerous, it is often a condition that people can learn to manageon their own.   Remain active. It is stressful on the back to sit or stand in one place. Do not sit, drive, or stand in one place for more than 30 minutes at a time. Take short walks on level surfaces as soon as pain allows.Try to increase the length of time you walk each day.  Do not stay in bed.Resting more than 1 or 2 days can delay your recovery.  Do not avoid exercise or work.Your body is made to move.It is not dangerous to be active, even though your back may hurt.Your back will likely heal faster if you return to being active before your pain is gone.  Pay attention to your body when you bend and lift. Many people have less discomfortwhen lifting if they bend their knees, keep the load close to their bodies,and  avoid twisting. Often, the most comfortable positions are those that put less stress on your recovering back.  Find a comfortable position to sleep. Use a firm mattress and lie on your side with your knees slightly bent. If you lie on your back, put a pillow under your knees.  Only take over-the-counter or prescription medicines as directed by your caregiver. Over-the-counter medicines to reduce pain and inflammation are often the most helpful.Your caregiver may prescribe muscle relaxant drugs.These medicines help dull your pain so you can more quickly return to your normal activities and healthy exercise.  Put ice on the injured area.  Put ice in a plastic bag.  Place a towel between your skin and the bag.  Leave the ice on for 15-20 minutes, 03-04 times a day for the first 2 to 3 days. After that, ice and heat may be alternated to reduce pain and spasms.  Ask your caregiver about trying back exercises and gentle massage. This may be of some benefit.  Avoid feeling anxious or stressed.Stress increases muscle tension and can worsen back pain.It is important to recognize when you are anxious or stressed and learn ways to manage it.Exercise is a great option. SEEK MEDICAL CARE IF:  You have pain that is not relieved with rest or medicine.  You have pain that does not improve in 1 week.  You have new symptoms.  You are generally not feeling well. SEEK   IMMEDIATE MEDICAL CARE IF:   You have pain that radiates from your back into your legs.  You develop new bowel or bladder control problems.  You have unusual weakness or numbness in your arms or legs.  You develop nausea or vomiting.  You develop abdominal pain.  You feel faint. Document Released: 05/18/2005 Document Revised: 11/17/2011 Document Reviewed: 10/06/2010 Exit[employer] Patient Information 2014 [employer].  

## 2013-07-01 ENCOUNTER — Ambulatory Visit (HOSPITAL_BASED_OUTPATIENT_CLINIC_OR_DEPARTMENT_OTHER): Payer: BC Managed Care – PPO

## 2013-07-01 NOTE — ED Provider Notes (Signed)
Medical screening examination/treatment/procedure(s) were performed by non-physician practitioner and as supervising physician I was immediately available for consultation/collaboration.  EKG Interpretation   None        Varney Biles, MD 07/01/13 [phone]

## 2013-07-02 HISTORY — PX: LUMBAR DISC SURGERY: SHX700

## 2013-07-11 ENCOUNTER — Ambulatory Visit (HOSPITAL_COMMUNITY): Admission: RE | Admit: 2013-07-11 | Payer: BC Managed Care – PPO | Source: Ambulatory Visit

## 2013-07-12 ENCOUNTER — Encounter: Payer: BC Managed Care – PPO | Admitting: Medical

## 2013-08-09 ENCOUNTER — Ambulatory Visit (INDEPENDENT_AMBULATORY_CARE_PROVIDER_SITE_OTHER): Payer: BC Managed Care – PPO | Admitting: Medical

## 2013-08-09 ENCOUNTER — Encounter: Payer: Self-pay | Admitting: Medical

## 2013-08-09 VITALS — BP 122/80 | HR 72 | Temp 97.8°F | Resp 16 | Ht 71.0 in | Wt 227.0 lb

## 2013-08-09 DIAGNOSIS — E785 Hyperlipidemia, unspecified: Secondary | ICD-10-CM

## 2013-08-09 DIAGNOSIS — E669 Obesity, unspecified: Secondary | ICD-10-CM

## 2013-08-09 DIAGNOSIS — Z0000 Encounter for general adult medical examination without abnormal findings: Secondary | ICD-10-CM

## 2013-08-09 DIAGNOSIS — F3281 Premenstrual dysphoric disorder: Secondary | ICD-10-CM

## 2013-08-09 DIAGNOSIS — N943 Premenstrual tension syndrome: Secondary | ICD-10-CM

## 2013-08-09 LAB — POCT URINALYSIS DIPSTICK
Bilirubin, UA: NEGATIVE
GLUCOSE UA: NEGATIVE
Ketones, UA: NEGATIVE
Nitrite, UA: NEGATIVE
SPEC GRAV UA: 1.02
Urobilinogen, UA: NEGATIVE
pH, UA: 5

## 2013-08-09 LAB — CBC
HCT: 38.3 % (ref 36.0–46.0)
Hemoglobin: 12.5 g/dL (ref 12.0–15.0)
MCH: 25.4 pg — ABNORMAL LOW (ref 26.0–34.0)
MCHC: 32.6 g/dL (ref 30.0–36.0)
MCV: 77.8 fL — AB (ref 78.0–100.0)
PLATELETS: 324 10*3/uL (ref 150–400)
RBC: 4.92 MIL/uL (ref 3.87–5.11)
RDW: 16.5 % — AB (ref 11.5–15.5)
WBC: 5.9 10*3/uL (ref 4.0–10.5)

## 2013-08-09 LAB — COMPREHENSIVE METABOLIC PANEL
ALK PHOS: 90 U/L (ref 39–117)
ALT: 39 U/L — ABNORMAL HIGH (ref 0–35)
AST: 30 U/L (ref 0–37)
Albumin: 4.2 g/dL (ref 3.5–5.2)
BUN: 12 mg/dL (ref 6–23)
CALCIUM: 9.4 mg/dL (ref 8.4–10.5)
CO2: 28 mEq/L (ref 19–32)
Chloride: 106 mEq/L (ref 96–112)
Creat: 0.82 mg/dL (ref 0.50–1.10)
GLUCOSE: 76 mg/dL (ref 70–99)
POTASSIUM: 4.1 meq/L (ref 3.5–5.3)
SODIUM: 141 meq/L (ref 135–145)
Total Bilirubin: 0.3 mg/dL (ref 0.2–1.2)
Total Protein: 7.1 g/dL (ref 6.0–8.3)

## 2013-08-09 LAB — LIPID PANEL
CHOL/HDL RATIO: 3.7 ratio
Cholesterol: 230 mg/dL — ABNORMAL HIGH (ref 0–200)
HDL: 63 mg/dL (ref >39)
LDL CALC: 143 mg/dL — AB (ref 0–99)
TRIGLYCERIDES: 119 mg/dL (ref <150)
VLDL: 24 mg/dL (ref 0–40)

## 2013-08-09 MED ORDER — SERTRALINE HCL 25 MG PO TABS: 25.0000 mg | ORAL_TABLET | Freq: Every day | ORAL | Status: DC

## 2013-08-09 MED ORDER — ALBUTEROL SULFATE HFA 108 (90 BASE) MCG/ACT IN AERS: 2.0000 | INHALATION_SPRAY | Freq: Four times a day (QID) | RESPIRATORY_TRACT | Status: DC | PRN

## 2013-08-09 NOTE — Patient Instructions (Signed)

## 2013-08-09 NOTE — Progress Notes (Signed)
Subjective:   HPI  Michelle Griffin is a 43 y.o. female who presents for a complete physical.  Medical care team includes:  Dr. Willis Modena - [facility]  [name], Allergist  Dr. Jolayne Panther, Dentist  [name] - Back specialist/Orthopedist  Dorothea Ogle, PA-C here for primary care   Preventative care: Last ophthalmology visit:yes Dr. Joaquim Lai Last dental visit:yes Dr. Jolayne Panther Last colonoscopy:few years ago Last mammogram:03/2013 Last gynecological exam:2012/2013 Last EKG:yes Last labs:?  Prior vaccinations: TD or Tdap:5 years ago Influenza:03/2013 Pneumococcal:n/a Shingles/Zostavax:n/a  Advanced directive:n/a Health care power of attorney:n/a Living will:n/a  Concerns: After last visit weaned off Effexor.  Hx/o PMDD - in the past has been on Effexor, Sertraline, Prozac.  Was curoius about [place].    Obesity - wants help with weight loss.  Recently had back surgery, has gained back some weight.  Reviewed their medical, surgical, family, social, medication, and allergy history and updated chart as appropriate.  Past Medical History  Diagnosis Date  . Allergy   . Asthma   . Hyperlipidemia 2012    was on pravastatin prior  . Anemia     hx/o iron deficiency due to heavy periods  . Migraine     prior eval with neurologist, Dr. Melton Alar  . GERD (gastroesophageal reflux disease)   . Pyelonephritis     age 29yo  . DDD (degenerative disc disease), lumbar   . Hiatal hernia     small per EGD, [name]  . H/O mammogram 03/2013  . Routine gynecological examination     pap 2013, [facility]    Past Surgical History  Procedure Laterality Date  . Cholecystectomy    . Foot surgery Left     morton's neuroma  . Lasik      [name]  . Wisdom tooth extraction    . Lumbar disc surgery  07/2013    due to herniated disc, [name]  . Colonoscopy  2014    [name], baseline  . Esophagogastroduodenoscopy      [name]    History    Social History  . Marital Status: Married    Spouse Name: N/A    Number of Children: N/A  . Years of Education: N/A   Occupational History  . Not on file.   Social History Main Topics  . Smoking status: Never Smoker   . Smokeless tobacco: Not on file  . Alcohol Use: Yes     Comment: socially  . Drug Use: No  . Sexual Activity: Not on file   Other Topics Concern  . Not on file   Social History Narrative   Married, 2 children 15yo and 20yo, dog, Darrick Meigs, involved in a motorcycle club;  Xray tech at [employer]    Family History  Problem Relation Age of Onset  . Lupus Mother   . Hypertension Mother   . Hyperlipidemia Mother   . Migraines Mother   . Hypertension Father   . Diabetes Father   . Hyperlipidemia Father   . Obesity Sister   . Hypertension Sister   . Diabetes Sister   . Heart disease Paternal Uncle     CABG  . Hyperlipidemia Paternal Uncle   . Cancer Neg Hx   . Stroke Neg Hx     Current outpatient prescriptions:EPINEPHrine (EPI-PEN) 0.3 mg/0.3 mL SOAJ injection, Inject 0.3 mg into the muscle once., Disp: , Rfl: ;  esomeprazole (NEXIUM) 40 MG capsule, Take 40 mg by mouth daily before breakfast.,  Disp: , Rfl: ;  ibuprofen (ADVIL,MOTRIN) 200 MG tablet, Take 400 mg by mouth every 6 (six) hours as needed for moderate pain., Disp: , Rfl: ;  levocetirizine (XYZAL) 5 MG tablet, Take 5 mg by mouth every evening., Disp: , Rfl:  loratadine (CLARITIN) 10 MG tablet, Take 10 mg by mouth daily., Disp: , Rfl: ;  Melatonin 10 MG CAPS, Take by mouth., Disp: , Rfl: ;  montelukast (SINGULAIR) 10 MG tablet, Take 10 mg by mouth at bedtime., Disp: , Rfl: ;  tolterodine (DETROL) 2 MG tablet, Take 2 mg by mouth at bedtime. , Disp: , Rfl:   Allergies  Allergen Reactions  . Sulfa Antibiotics Hives       Review of Systems Constitutional: -fever, -chills, -sweats, +unexpected weight change, -decreased appetite, -fatigue Allergy: -sneezing, -itching, -congestion Dermatology:  -changing moles, --rash, -lumps ENT: -runny nose, -ear pain, -sore throat, -hoarseness, -sinus pain, -teeth pain, - ringing in ears, -hearing loss, -nosebleeds Cardiology: -chest pain, -palpitations, -swelling, -difficulty breathing when lying flat, -waking up short of breath Respiratory: -cough, -shortness of breath, -difficulty breathing with exercise or exertion, -wheezing, -coughing up blood Gastroenterology: -abdominal pain, -nausea, -vomiting, -diarrhea, -constipation, -blood in stool, -changes in bowel movement, -difficulty swallowing or eating Hematology: -bleeding, -bruising  Musculoskeletal: -joint aches, -muscle aches, -joint swelling, -back pain, -neck pain, -cramping, -changes in gait Ophthalmology: denies vision changes, eye redness, itching, discharge Urology: -burning with urination, -difficulty urinating, -blood in urine, -urinary frequency, -urgency, -incontinence Neurology: -headache, -weakness, -tingling, -numbness, -memory loss, -falls, -dizziness Psychology: -depressed mood, -agitation, -sleep problems     Objective:   Physical Exam  BP 122/80  Pulse 72  Temp(Src) 97.8 F (36.6 C) (Oral)  Resp 16  Ht 5\' 11"  (1.803 m)  Wt 227 lb (102.967 kg)  BMI 31.67 kg/m2  General appearance: alert, no distress, WD/WN, white female Skin: scattered benign appearing macules, no worrisome lesions HEENT: normocephalic, conjunctiva/corneas normal, sclerae anicteric, PERRLA, EOMi, nares patent, no discharge or erythema, pharynx normal Oral cavity: MMM, tongue normal, teeth in good repair Neck: supple, no lymphadenopathy, no thyromegaly, no masses, normal ROM, no bruits Chest: non tender, normal shape and expansion Heart: RRR, normal S1, S2, no murmurs Lungs: CTA bilaterally, no wheezes, rhonchi, or rales Abdomen: +bs, soft, few small RUQ surgical scars, non tender, non distended, no masses, no hepatomegaly, no splenomegaly, no bruits Back: non tender, lumbar surgical scar, new,  healing appropriately, still in mild pain from recent surgery Musculoskeletal: upper extremities non tender, no obvious deformity, normal ROM throughout, lower extremities non tender, no obvious deformity, normal ROM throughout Extremities: no edema, no cyanosis, no clubbing Pulses: 2+ symmetric, upper and lower extremities, normal cap refill Neurological: alert, oriented x 3, CN2-12 intact, strength normal upper extremities and lower extremities, sensation normal throughout, DTRs 2+ throughout, no cerebellar signs, gait normal Psychiatric: normal affect, behavior normal, pleasant  Breast/gyn/rectal - deferred to gynecology   Assessment and Plan :    Encounter Diagnoses  Name Primary?  . Routine general medical examination at a health care facility Yes  . PMDD (premenstrual dysphoric disorder)   . Obesity, unspecified   . Hyperlipidemia      Physical exam - discussed healthy lifestyle, diet, exercise, preventative care, vaccinations, and addressed their concerns.  Handout given. PMDD - discussed concerns, prior medications, begin back on Zoloft 25mg  daily.  discussed risks/benefits of medication Obesity - work on healthy lifestyle, f/u pending labs hyperlipidemia - not currently on medication, been out.  Labs today. Follow-up pending  labs.

## 2013-08-10 LAB — TSH: TSH: 2.697 u[IU]/mL (ref 0.350–4.500)

## 2013-08-14 NOTE — Progress Notes (Signed)
LMOM TO CB. CLS 

## 2013-08-15 ENCOUNTER — Other Ambulatory Visit: Payer: Self-pay | Admitting: Family Medicine

## 2013-08-15 ENCOUNTER — Telehealth: Payer: Self-pay | Admitting: Family Medicine

## 2013-08-15 MED ORDER — PRAVASTATIN SODIUM 40 MG PO TABS: 40.0000 mg | ORAL_TABLET | Freq: Every day | ORAL | Status: DC

## 2013-08-15 NOTE — Telephone Encounter (Signed)
The question is whether she really needs to be on medication or not?  Current lipid panel shows high total cholesterol but based on risk factors and rest of labs, I'm on the fence of whether benefits of the medication outweighs the risks and costs.    If she has been on it, tolerated well, then its reasonable to go back on the medication indefinitely if insurer won't cover the NMR test.  See what she wants to do?

## 2013-08-15 NOTE — Telephone Encounter (Signed)
I spoke with the patient and the patient states that she will go back on the medication. She said she will try it for 6 months and come back in for a follow up to have her lab work done again. She said her daughter is 41 years old and she has high cholesterol. I look on her old medication list and saw that she was on Pravastatin 40 mg so I sent the medication in to the pharmacy with refills. CLS

## 2013-08-15 NOTE — Telephone Encounter (Signed)
Patient called and said that she check with her insurance company and insurance will not cover NMR lipoprotein panel. She said if you need her to start back on pravastatin to just send it to her pharmacy. CLS

## 2013-08-16 ENCOUNTER — Other Ambulatory Visit: Payer: Self-pay | Admitting: Medical

## 2013-08-25 ENCOUNTER — Telehealth: Payer: Self-pay | Admitting: Medical

## 2013-08-25 ENCOUNTER — Encounter: Payer: Self-pay | Admitting: Medical

## 2013-08-25 NOTE — Telephone Encounter (Signed)
Have her use a food diary for 3 days, or log these into My fitness PAL or  [employer] on the Smart phone, right now what exercise she is doing, and have her come in for visit to discuss the medication options and a plan.

## 2013-08-28 NOTE — Telephone Encounter (Signed)
Advised pt of Shane's instructions and pt made follow up appt for [facility] 4/2

## 2013-08-31 ENCOUNTER — Ambulatory Visit (INDEPENDENT_AMBULATORY_CARE_PROVIDER_SITE_OTHER): Payer: BC Managed Care – PPO | Admitting: Medical

## 2013-08-31 ENCOUNTER — Encounter: Payer: Self-pay | Admitting: Medical

## 2013-08-31 VITALS — BP 116/70 | HR 72 | Temp 98.1°F | Resp 18 | Ht 71.0 in | Wt 236.0 lb

## 2013-08-31 DIAGNOSIS — E669 Obesity, unspecified: Secondary | ICD-10-CM

## 2013-08-31 MED ORDER — PHENTERMINE-TOPIRAMATE ER 3.75-23 MG PO CP24: 1.0000 | ORAL_CAPSULE | ORAL | Status: DC

## 2013-08-31 MED ORDER — PHENTERMINE-TOPIRAMATE ER 7.5-46 MG PO CP24: 1.0000 | ORAL_CAPSULE | ORAL | Status: DC

## 2013-08-31 NOTE — Progress Notes (Signed)
   Subjective:   Michelle Griffin is a 43 y.o. female presenting on 08/31/2013 with concern for obesity  Up until the last few years she has never had a problem with her weight.  When she hit 37 things seem to change.  She is currently the heaviest she has ever been. In recent months she did undergo back surgery.  That went well but in the last couple weeks she has been having neck pain and is currently on a course of prednisone which has caused her to gain 10 pounds.  We started her on 25 mg of Zoloft recently which she seems to be tolerating okay.  Currently she is walking for exercise.  She is trying to use careful diet. However in the last week she did have one cheeseburger and fries meal, she went to eat [place] food today and overdid it a little.  Her weaknesses have been bread and diet [facility].  Her sister is morbidly obese, over 400lb.    Prior treatment for obesity has included Adipex for a few months without side effects . She lost a lot of weight with this.  Until her recent back surgery she was walking regularly. She plans to get a Physiological scientist.  No other complaint.  Review of Systems ROS as in subjective      Objective:     Filed Vitals:   08/31/13 1430  BP: 116/70  Pulse: 72  Temp: 98.1 F (36.7 C)  Resp: 18    General appearance: alert, no distress, WD/WN Heart: RRR, normal S1, S2, no murmurs Lungs: CTA bilaterally, no wheezes, rhonchi, or rales Pulses: 2+ symmetric, upper and lower extremities, normal cap refill Ext: no edema      Assessment: Encounter Diagnosis  Name Primary?  . Obesity, unspecified Yes     Plan: We discussed her symptoms, concerns, medication options, discussed risk and benefits, and she will begin Qsymia.  We discussed diet changes, exercise, limiting to 1800 calories per day. Recheck in one month  Michelle Griffin was seen today for weight loss.  Diagnoses and associated orders for this visit:  Obesity, unspecified  Other  Orders - Phentermine-Topiramate (QSYMIA) 3.75-23 MG CP24; Take 1 capsule by mouth every morning. - Phentermine-Topiramate (QSYMIA) 7.5-46 MG CP24; Take 1 capsule by mouth every morning.     Return 1-86mo.

## 2013-08-31 NOTE — Patient Instructions (Signed)
  Thank you for giving me the opportunity to serve you today.    Your diagnosis today includes: Encounter Diagnosis  Name Primary?  . Obesity, unspecified Yes     Specific recommendations today include:  Begin Qsymia low dose once daily in the morning for 2 weeks  Then increase to the higher dose of Qsymia once daily in the morning  Use My Fitness Pal or Livestrong app for the smartphone to track your calories, exercise and progress  Exercise most days of the week for 45-60 minutes such as combination of walking, elliptical machine, swimming, calisthenics, circuit training  We can refer to nutritionist if desired  Try and limit your calories to 1500-1800 per day  Return 1-89mo.    I have included other useful information below for your review.   I recommend a healthy diet.    Do's:   whole grains such as whole grain pasta, rice, whole grains breads and whole grain cereals.  Use small quantities such as 1/2 cup per serving or 2 slices of bread per serving.    Eat 3-5 fruits daily  Eat beans at least once daily  Eat almonds in small quantities at least 3 days per week    If they eat meat, I recommend small portions of lean meats such as chicken, fish, and [place].  Eat as much NON potato vegetables as they like, particularly raw or steamed  Drink several large glasses of water daily  Cautions:  Limit red meat  Limit corn and potatoes  Limit sweets, cake, pie, candy  Limit beer and alcohol  Avoid fried food, fast food, large portions  Avoid sugary drinks such as regular soda and sweet tea

## 2013-09-11 ENCOUNTER — Ambulatory Visit (HOSPITAL_COMMUNITY): Payer: BC Managed Care – PPO

## 2013-09-12 ENCOUNTER — Ambulatory Visit (HOSPITAL_COMMUNITY)
Admission: RE | Admit: 2013-09-12 | Discharge: 2013-09-12 | Disposition: A | Discharge status: Home / Self Care | Payer: BC Managed Care – PPO | Source: Ambulatory Visit | Attending: Orthopedic Surgery | Admitting: Orthopedic Surgery

## 2013-09-12 DIAGNOSIS — M542 Cervicalgia: Secondary | ICD-10-CM | POA: Insufficient documentation

## 2013-09-12 DIAGNOSIS — M5416 Radiculopathy, lumbar region: Secondary | ICD-10-CM

## 2013-09-12 DIAGNOSIS — M5412 Radiculopathy, cervical region: Secondary | ICD-10-CM | POA: Insufficient documentation

## 2013-10-10 ENCOUNTER — Telehealth: Payer: Self-pay | Admitting: Medical

## 2013-10-11 ENCOUNTER — Other Ambulatory Visit: Payer: Self-pay | Admitting: Medical

## 2013-10-11 MED ORDER — SERTRALINE HCL 25 MG PO TABS: 25.0000 mg | ORAL_TABLET | Freq: Every day | ORAL | Status: DC

## 2013-10-11 NOTE — Telephone Encounter (Signed)
done

## 2013-10-31 ENCOUNTER — Encounter: Payer: Self-pay | Admitting: Medical

## 2013-10-31 ENCOUNTER — Ambulatory Visit (INDEPENDENT_AMBULATORY_CARE_PROVIDER_SITE_OTHER): Payer: BC Managed Care – PPO | Admitting: Medical

## 2013-10-31 VITALS — BP 122/80 | HR 80 | Temp 98.6°F | Resp 16 | Wt 228.0 lb

## 2013-10-31 DIAGNOSIS — E669 Obesity, unspecified: Secondary | ICD-10-CM

## 2013-10-31 DIAGNOSIS — N943 Premenstrual tension syndrome: Secondary | ICD-10-CM

## 2013-10-31 DIAGNOSIS — F3281 Premenstrual dysphoric disorder: Secondary | ICD-10-CM

## 2013-10-31 MED ORDER — SERTRALINE HCL 100 MG PO TABS: 100.0000 mg | ORAL_TABLET | Freq: Every day | ORAL | Status: DC

## 2013-10-31 MED ORDER — PHENTERMINE HCL 37.5 MG PO TABS: 37.5000 mg | ORAL_TABLET | Freq: Every day | ORAL | Status: DC

## 2013-10-31 NOTE — Progress Notes (Signed)
  Subjective:   Michelle Griffin is a 43 y.o. female presenting on 10/31/2013 for recheck.  Here for recheck on PMDD.  Taking zoloft 25mg  with no change, used husband's zoloft 50mg  x 2+ weeks with no improvement.  Prior did ok on Effexor, but thought this causing too much fatigue.   In general gets depressed mood during menstrual periods, horrible cramping, irritability.     Here for recheck on obesity.  Did the first 2wk of Qsymia, but can't afford the regular dose.  Up until the last few years she has never had a problem with her weight.  When she hit 37 things seem to change.  She is currently the heaviest she has ever been. In recent months she did undergo back surgery.   Currently she is walking for exercise.  She is trying to use careful diet. Her weaknesses have been bread and diet [facility].  Her sister is morbidly obese, over 400lb.  Prior treatment for obesity has included Adipex for a few months without side effects . She lost a lot of weight with this.  Until her recent back surgery she was walking regularly. She plans to get a Physiological scientist.  No other complaint.  Review of Systems ROS as in subjective      Objective:    Filed Vitals:   10/31/13 1531  BP: 122/80  Pulse: 80  Temp: 98.6 F (37 C)  Resp: 16    General appearance: alert, no distress, WD/WN Heart: RRR, normal S1, S2, no murmurs Lungs: CTA bilaterally, no wheezes, rhonchi, or rales Pulses: 2+ symmetric, upper and lower extremities, normal cap refill Ext: no edema      Assessment: Encounter Diagnoses  Name Primary?  [name] PMDD (premenstrual dysphoric disorder) Yes  . Obesity, unspecified      Plan: PMDD - increase to Zoloft 100mg .  Had already failed 25mg  and husband's 50mg  x last 3 weeks.  If not improving in 2-3 wk, change back to Effexor.  Obesity - unable to afford Qsymia, but through diet and exercise has lost weight since last visit.   Begin Phentermine, discussed risk and benefits, c/t healthy  diet, exercise, limiting to 1800 calories per day. Recheck in 1-69mo.  Michelle Griffin was seen today for follow-up.  Diagnoses and associated orders for this visit:  PMDD (premenstrual dysphoric disorder)  Obesity, unspecified  Other Orders - phentermine (ADIPEX-P) 37.5 MG tablet; Take 1 tablet (37.5 mg total) by mouth daily before breakfast. - sertraline (ZOLOFT) 100 MG tablet; Take 1 tablet (100 mg total) by mouth daily.    Return 1-2 mo.

## 2013-12-20 ENCOUNTER — Telehealth: Payer: Self-pay | Admitting: Medical

## 2013-12-20 ENCOUNTER — Ambulatory Visit (INDEPENDENT_AMBULATORY_CARE_PROVIDER_SITE_OTHER): Payer: BC Managed Care – PPO | Admitting: Medical

## 2013-12-20 ENCOUNTER — Encounter: Payer: Self-pay | Admitting: Medical

## 2013-12-20 VITALS — BP 122/80 | HR 80 | Temp 98.1°F | Resp 16 | Wt 219.0 lb

## 2013-12-20 DIAGNOSIS — R109 Unspecified abdominal pain: Secondary | ICD-10-CM

## 2013-12-20 DIAGNOSIS — M5020 Other cervical disc displacement, unspecified cervical region: Secondary | ICD-10-CM

## 2013-12-20 DIAGNOSIS — E669 Obesity, unspecified: Secondary | ICD-10-CM

## 2013-12-20 DIAGNOSIS — F3281 Premenstrual dysphoric disorder: Secondary | ICD-10-CM

## 2013-12-20 DIAGNOSIS — E785 Hyperlipidemia, unspecified: Secondary | ICD-10-CM

## 2013-12-20 DIAGNOSIS — N943 Premenstrual tension syndrome: Secondary | ICD-10-CM

## 2013-12-20 LAB — LIPID PANEL
CHOLESTEROL: 177 mg/dL (ref 0–200)
HDL: 85 mg/dL (ref >39)
LDL Cholesterol: 71 mg/dL (ref 0–99)
Total CHOL/HDL Ratio: 2.1 Ratio
Triglycerides: 105 mg/dL (ref <150)
VLDL: 21 mg/dL (ref 0–40)

## 2013-12-20 LAB — POCT URINALYSIS DIPSTICK
Bilirubin, UA: NEGATIVE
Glucose, UA: NEGATIVE
KETONES UA: NEGATIVE
Nitrite, UA: NEGATIVE
PH UA: 7
Protein, UA: NEGATIVE
Spec Grav, UA: <=1.005
Urobilinogen, UA: NEGATIVE

## 2013-12-20 LAB — HEPATIC FUNCTION PANEL
ALBUMIN: 4.2 g/dL (ref 3.5–5.2)
ALT: 62 U/L — ABNORMAL HIGH (ref 0–35)
AST: 49 U/L — ABNORMAL HIGH (ref 0–37)
Alkaline Phosphatase: 105 U/L (ref 39–117)
Bilirubin, Direct: 0.1 mg/dL (ref 0.0–0.3)
Indirect Bilirubin: 0.2 mg/dL (ref 0.2–1.2)
TOTAL PROTEIN: 7.2 g/dL (ref 6.0–8.3)
Total Bilirubin: 0.3 mg/dL (ref 0.2–1.2)

## 2013-12-20 LAB — BASIC METABOLIC PANEL
BUN: 9 mg/dL (ref 6–23)
CHLORIDE: 104 meq/L (ref 96–112)
CO2: 27 meq/L (ref 19–32)
Calcium: 9 mg/dL (ref 8.4–10.5)
Creat: 0.71 mg/dL (ref 0.50–1.10)
GLUCOSE: 75 mg/dL (ref 70–99)
POTASSIUM: 4.4 meq/L (ref 3.5–5.3)
Sodium: 140 mEq/L (ref 135–145)

## 2013-12-20 LAB — CK: CK TOTAL: 87 U/L (ref 7–177)

## 2013-12-20 MED ORDER — METHYLPREDNISOLONE (PAK) 4 MG PO TABS: ORAL_TABLET | ORAL | Status: DC

## 2013-12-20 MED ORDER — PHENTERMINE HCL 37.5 MG PO TABS: 37.5000 mg | ORAL_TABLET | Freq: Every day | ORAL | Status: DC

## 2013-12-20 NOTE — Telephone Encounter (Signed)
I called out the Phentermine to her pharmacy per Chana Bode PAC. CLS

## 2013-12-20 NOTE — Progress Notes (Signed)
q 

## 2013-12-20 NOTE — Progress Notes (Signed)
   Subjective:    Patient ID: Basil H Alfred, female    DOB: Cheryll 29, 1972, 43 y.o.   MRN: [id number]  HPI  Patient is presenting for follow up on weight loss and PMDD from 2 months ago.  PMDD - taking Zoloft 100mg  daily. Denies mood swings, n/v, decreased libido. Reports that she's doing well with Zoloft.  Obesity - started Adipex 2 months ago. She has noticed intermittent RUQ pain since starting phentermine. Has had a lap chole 0623, without complications. Cannot recall any precipitating factors apart from having the pain in the afternoon and evenings. Pain not associated with meals. She is concerned about her RUQ pain and would like to have her lft's checked. She also thought this may be related to her cholesterol med, pravastatin, which she has been taking since 07/2013. Diet consists of more fruit, water, less bread, opts for whole grains. Is trying to exercise more, has done walking/treadmill 3x a week. Is taking a 1/2 pill of phentermine, feels jumpy when she takes the whole pill. Otherwise denies sob, heart racing, palpitations. No vaginal or urinary c/o.   DDD - patients states she was scheduled to have her first steroid injection this week for cervical DDD but cancelled this d/t changing locations with her work. Would like to have a dose pack in the meantime just in case she has flare up of neck pain/inflammation.  Going out of town, wants medrol Dosepak in case neck flares up.  No other aggravating or relieving factors.  Review of Systems As in subjective.    Objective:   Physical Exam  BP 122/80  Pulse 80  Temp(Src) 98.1 F (36.7 C) (Oral)  Resp 16  Wt 219 lb (99.338 kg)  Wt Readings from Last 3 Encounters:  12/20/13 219 lb (99.338 kg)  10/31/13 228 lb (103.42 kg)  08/31/13 236 lb (107.049 kg)   General appearance: alert, no distress, WD/WN, overweight. Heart: RRR, normal S1, s2, no murmurs Lungs clear abdomen +bs, soft, nontender, no mass, no organomegaly Back  nontender Ext: no edema MSK: nontender UE and LE     Assessment & Plan:   Encounter Diagnoses  Name Primary?  [name] PMDD (premenstrual dysphoric disorder) Yes  . Obesity, unspecified   . Flank pain   . Displacement of intervertebral disc of cervical region   . Hyperlipidemia    PMDD - doing better on Zoloft 100mg  daily. C/t same medicsation  Obesity - c/t to see improvements, c/t 1/2 tablet daily c/t exercise and healthy diet  Flank pain - labs and UA today  Medrol dose pak sent in the event of C spine flare up while on vacation  hyperlipidemia - recheck labs today.  F/u pending labs.   Patient was seen in conjunction with PA student Jaynee Eagles, and I have also evaluated and examined patient, agree with student's notes, student supervised by me.

## 2013-12-20 NOTE — Telephone Encounter (Signed)
pls call out phentermine refill and results UA

## 2014-01-23 ENCOUNTER — Other Ambulatory Visit: Payer: BC Managed Care – PPO

## 2014-01-23 DIAGNOSIS — E785 Hyperlipidemia, unspecified: Secondary | ICD-10-CM

## 2014-01-24 LAB — HEPATIC FUNCTION PANEL
ALT: 41 U/L — ABNORMAL HIGH (ref 0–35)
AST: 36 U/L (ref 0–37)
Albumin: 4.4 g/dL (ref 3.5–5.2)
Alkaline Phosphatase: 93 U/L (ref 39–117)
BILIRUBIN TOTAL: 0.3 mg/dL (ref 0.2–1.2)
Bilirubin, Direct: 0.1 mg/dL (ref 0.0–0.3)
Indirect Bilirubin: 0.2 mg/dL (ref 0.2–1.2)
Total Protein: 7.1 g/dL (ref 6.0–8.3)

## 2014-01-26 ENCOUNTER — Other Ambulatory Visit: Payer: Self-pay | Admitting: Family Medicine

## 2014-01-26 DIAGNOSIS — R1011 Right upper quadrant pain: Secondary | ICD-10-CM

## 2014-01-26 DIAGNOSIS — R748 Abnormal levels of other serum enzymes: Secondary | ICD-10-CM

## 2014-01-30 ENCOUNTER — Ambulatory Visit (HOSPITAL_COMMUNITY)
Admission: RE | Admit: 2014-01-30 | Discharge: 2014-01-30 | Disposition: A | Discharge status: Home / Self Care | Payer: BC Managed Care – PPO | Source: Ambulatory Visit | Attending: Medical | Admitting: Medical

## 2014-01-30 DIAGNOSIS — K7689 Other specified diseases of liver: Secondary | ICD-10-CM | POA: Diagnosis not present

## 2014-01-30 DIAGNOSIS — R1011 Right upper quadrant pain: Secondary | ICD-10-CM | POA: Diagnosis not present

## 2014-01-30 DIAGNOSIS — Z9089 Acquired absence of other organs: Secondary | ICD-10-CM | POA: Insufficient documentation

## 2014-01-30 DIAGNOSIS — R748 Abnormal levels of other serum enzymes: Secondary | ICD-10-CM | POA: Insufficient documentation

## 2014-02-01 ENCOUNTER — Other Ambulatory Visit: Payer: Self-pay

## 2014-02-02 ENCOUNTER — Other Ambulatory Visit (INDEPENDENT_AMBULATORY_CARE_PROVIDER_SITE_OTHER): Payer: BC Managed Care – PPO

## 2014-02-02 ENCOUNTER — Other Ambulatory Visit: Payer: Self-pay | Admitting: Medical

## 2014-02-02 DIAGNOSIS — R109 Unspecified abdominal pain: Secondary | ICD-10-CM

## 2014-02-02 LAB — POCT URINALYSIS DIPSTICK
BILIRUBIN UA: NEGATIVE
Blood, UA: NEGATIVE
GLUCOSE UA: NEGATIVE
Ketones, UA: NEGATIVE
Leukocytes, UA: NEGATIVE
NITRITE UA: NEGATIVE
SPEC GRAV UA: 1.01
Urobilinogen, UA: NEGATIVE
pH, UA: 5

## 2014-02-03 LAB — AFP TUMOR MARKER: AFP-Tumor Marker: 4.6 ng/mL (ref <6.1)

## 2014-02-03 LAB — HEPATITIS PANEL, ACUTE
HCV AB: NEGATIVE
HEP A IGM: NONREACTIVE
HEP B C IGM: NONREACTIVE
HEP B S AG: NEGATIVE

## 2014-02-03 LAB — IRON AND TIBC
%SAT: 6 % — ABNORMAL LOW (ref 20–55)
Iron: 23 ug/dL — ABNORMAL LOW (ref 42–145)
TIBC: 403 ug/dL (ref 250–470)
UIBC: 380 ug/dL (ref 125–400)

## 2014-02-13 ENCOUNTER — Telehealth: Payer: Self-pay | Admitting: Medical

## 2014-02-13 NOTE — Telephone Encounter (Signed)
I spoke to patient.  She will begin OTC iron and multivitamin.  She will return in October regarding low iron, repeat CBC, obesity /phentermine f/u, flank pain, and elevated LFTs.

## 2014-02-28 ENCOUNTER — Telehealth: Payer: Self-pay | Admitting: Internal Medicine

## 2014-02-28 ENCOUNTER — Other Ambulatory Visit: Payer: Self-pay | Admitting: Medical

## 2014-02-28 MED ORDER — SERTRALINE HCL 100 MG PO TABS: 100.0000 mg | ORAL_TABLET | Freq: Every day | ORAL | Status: DC

## 2014-02-28 NOTE — Telephone Encounter (Signed)
Refill request for sertraline 100mg  to [facility]

## 2014-04-09 ENCOUNTER — Encounter (HOSPITAL_BASED_OUTPATIENT_CLINIC_OR_DEPARTMENT_OTHER): Payer: Self-pay | Admitting: *Deleted

## 2014-04-09 ENCOUNTER — Emergency Department (HOSPITAL_BASED_OUTPATIENT_CLINIC_OR_DEPARTMENT_OTHER): Payer: BC Managed Care – PPO

## 2014-04-09 ENCOUNTER — Emergency Department (HOSPITAL_BASED_OUTPATIENT_CLINIC_OR_DEPARTMENT_OTHER)
Admission: EM | Admit: 2014-04-09 | Discharge: 2014-04-09 | Disposition: A | Discharge status: Home / Self Care | Payer: BC Managed Care – PPO | Attending: Emergency Medicine | Admitting: Emergency Medicine

## 2014-04-09 DIAGNOSIS — Z8739 Personal history of other diseases of the musculoskeletal system and connective tissue: Secondary | ICD-10-CM | POA: Insufficient documentation

## 2014-04-09 DIAGNOSIS — R079 Chest pain, unspecified: Secondary | ICD-10-CM | POA: Insufficient documentation

## 2014-04-09 DIAGNOSIS — Z862 Personal history of diseases of the blood and blood-forming organs and certain disorders involving the immune mechanism: Secondary | ICD-10-CM | POA: Diagnosis not present

## 2014-04-09 DIAGNOSIS — G43909 Migraine, unspecified, not intractable, without status migrainosus: Secondary | ICD-10-CM | POA: Insufficient documentation

## 2014-04-09 DIAGNOSIS — K219 Gastro-esophageal reflux disease without esophagitis: Secondary | ICD-10-CM | POA: Insufficient documentation

## 2014-04-09 DIAGNOSIS — J45909 Unspecified asthma, uncomplicated: Secondary | ICD-10-CM | POA: Insufficient documentation

## 2014-04-09 DIAGNOSIS — E785 Hyperlipidemia, unspecified: Secondary | ICD-10-CM | POA: Insufficient documentation

## 2014-04-09 DIAGNOSIS — Z87448 Personal history of other diseases of urinary system: Secondary | ICD-10-CM | POA: Diagnosis not present

## 2014-04-09 DIAGNOSIS — Z79899 Other long term (current) drug therapy: Secondary | ICD-10-CM | POA: Insufficient documentation

## 2014-04-09 DIAGNOSIS — R0789 Other chest pain: Secondary | ICD-10-CM

## 2014-04-09 DIAGNOSIS — Z7952 Long term (current) use of systemic steroids: Secondary | ICD-10-CM | POA: Insufficient documentation

## 2014-04-09 LAB — CBC WITH DIFFERENTIAL/PLATELET
Basophils Absolute: 0.1 10*3/uL (ref 0.0–0.1)
Basophils Relative: 1 % (ref 0–1)
EOS ABS: 0 10*3/uL (ref 0.0–0.7)
Eosinophils Relative: 0 % (ref 0–5)
HCT: 37.8 % (ref 36.0–46.0)
HEMOGLOBIN: 12.1 g/dL (ref 12.0–15.0)
Lymphocytes Relative: 20 % (ref 12–46)
Lymphs Abs: 1.4 10*3/uL (ref 0.7–4.0)
MCH: 25.4 pg — AB (ref 26.0–34.0)
MCHC: 32 g/dL (ref 30.0–36.0)
MCV: 79.2 fL (ref 78.0–100.0)
MONO ABS: 0.8 10*3/uL (ref 0.1–1.0)
MONOS PCT: 12 % (ref 3–12)
NEUTROS ABS: 4.8 10*3/uL (ref 1.7–7.7)
Neutrophils Relative %: 67 % (ref 43–77)
Platelets: 296 10*3/uL (ref 150–400)
RBC: 4.77 MIL/uL (ref 3.87–5.11)
RDW: 15.6 % — ABNORMAL HIGH (ref 11.5–15.5)
WBC: 7.1 10*3/uL (ref 4.0–10.5)

## 2014-04-09 LAB — BASIC METABOLIC PANEL
ANION GAP: 14 (ref 5–15)
BUN: 13 mg/dL (ref 6–23)
CO2: 26 meq/L (ref 19–32)
CREATININE: 0.8 mg/dL (ref 0.50–1.10)
Calcium: 9.5 mg/dL (ref 8.4–10.5)
Chloride: 100 mEq/L (ref 96–112)
GFR calc non Af Amer: >90 mL/min (ref >90)
Glucose, Bld: 110 mg/dL — ABNORMAL HIGH (ref 70–99)
POTASSIUM: 4.2 meq/L (ref 3.7–5.3)
Sodium: 140 mEq/L (ref 137–147)

## 2014-04-09 LAB — TROPONIN I
Troponin I: <0.3 ng/mL (ref <0.30)
Troponin I: <0.3 ng/mL (ref <0.30)

## 2014-04-09 MED ORDER — TRAMADOL HCL 50 MG PO TABS: 50.0000 mg | ORAL_TABLET | Freq: Four times a day (QID) | ORAL | Status: DC | PRN

## 2014-04-09 NOTE — ED Provider Notes (Signed)
5:00 PM Patient signed out to me at shift change. Patient was evaluated for short lived chest pain. EKG and chest x-ray were normal. First troponin was negative. Patient has a history of elevated cholesterol. She is currently awaiting delta troponin that is to be drawn at 5:45 PM. Patient is currently chest pain-free. Previous provider did not suspect PE, PERC negative/low risk Wells.  6:37 PM patient seen, informed of results. She has not had return of symptoms. She is feeling well and is ready to go home. She states that she is able to follow-up with PCP and will call tomorrow. She appears well and wants to go home. No SOB.   6:38 PM Patient was counseled to return with severe chest pain, especially if the pain is crushing or pressure-like and spreads to the arms, back, neck, or jaw, or if they have sweating, nausea, or shortness of breath with the pain. They were encouraged to call 911 with these symptoms.   They were also told to return if their chest pain gets worse and does not go away with rest, they have an attack of chest pain lasting longer than usual despite rest and treatment with the medications their caregiver has prescribed, if they wake from sleep with chest pain or shortness of breath, if they feel dizzy or faint, if they have chest pain not typical of their usual pain, or if they have any other emergent concerns regarding their health.  The patient verbalized understanding and agreed.     Carlisle Cater, PA-C 04/09/14 [place], MD 04/12/14 1728

## 2014-04-09 NOTE — ED Notes (Signed)
MD at bedside discussing plan of care.  Pt will be staying for second troponin.  Pt OK with this plan.

## 2014-04-09 NOTE — ED Notes (Signed)
Patient transported to X-ray 

## 2014-04-09 NOTE — Discharge Instructions (Signed)
Please read and follow all provided instructions.  Your diagnoses today include:  1. Atypical chest pain   2. Chest pain     Tests performed today include:  An EKG of your heart  A chest x-ray  Cardiac enzymes - a blood test for heart muscle damage  Blood counts and electrolytes  Vital signs. See below for your results today.   Medications prescribed:   Tramadol - narcotic-like pain medication  DO NOT drive or perform any activities that require you to be awake and alert because this medicine can make you drowsy.   Take any prescribed medications only as directed.  Follow-up instructions: Please follow-up with your primary care provider as soon as you can for further evaluation of your symptoms.   Return instructions:  SEEK IMMEDIATE MEDICAL ATTENTION IF:  You have severe chest pain, especially if the pain is crushing or pressure-like and spreads to the arms, back, neck, or jaw, or if you have sweating, nausea (feeling sick to your stomach), or shortness of breath. THIS IS AN EMERGENCY. Don't wait to see if the pain will go away. Get medical help at once. Call 911 or [phone] (operator). DO NOT drive yourself to the hospital.   Your chest pain gets worse and does not go away with rest.   You have an attack of chest pain lasting longer than usual, despite rest and treatment with the medications your caregiver has prescribed.   You wake from sleep with chest pain or shortness of breath.  You feel dizzy or faint.  You have chest pain not typical of your usual pain for which you originally saw your caregiver.   You have any other emergent concerns regarding your health.  Additional Information: Chest pain comes from many different causes. Your caregiver has diagnosed you as having chest pain that is not specific for one problem, but does not require admission.  You are at low risk for an acute heart condition or other serious illness.   Your vital signs today were: BP 133/65  mmHg   Pulse 100   Temp(Src) 98.2 F (36.8 C) (Oral)   Resp 16   Ht 5\' 10"  (7.322 m)   Wt 219 lb (99.338 kg)   BMI 31.42 kg/m2   SpO2 98%   LMP 04/05/2014 If your blood pressure (BP) was elevated above 135/85 this visit, please have this repeated by your doctor within one month. --------------

## 2014-04-09 NOTE — ED Notes (Signed)
Pain in the center of her chest into the left side of her neck while at work today. States her BP has been elevated for the past few weeks.

## 2014-04-09 NOTE — ED Provider Notes (Signed)
CSN: [id number]     Arrival date & time 04/09/14  1327 History   First MD Initiated Contact with Patient 04/09/14 1447     Chief Complaint  Patient presents with  . Chest Pain     (Consider location/radiation/quality/duration/timing/severity/associated sxs/prior Treatment) HPI Comments: Pt comes in today with substernal cp that started about 2 hours ago. Pt states that the pain has radiated up the left and right side of the neck intermittently. Pt states that the pain has decreased. No sob. Pt was crushing initially and is now dull. Was associated with diaphoresis and nausea. Has a history of elevated cholesterol.  The history is provided by the patient. No language interpreter was used.    Past Medical History  Diagnosis Date  . Allergy   . Asthma   . Hyperlipidemia 2012    was on pravastatin prior  . Anemia     hx/o iron deficiency due to heavy periods  . Migraine     prior eval with neurologist, Dr. Melton Alar  . GERD (gastroesophageal reflux disease)   . Pyelonephritis     age 86yo  . DDD (degenerative disc disease), lumbar   . Hiatal hernia     small per EGD, [name]  . H/O mammogram 03/2013  . Routine gynecological examination     pap 2013, [facility]  . Overactive bladder   . PMDD (premenstrual dysphoric disorder)    Past Surgical History  Procedure Laterality Date  . Cholecystectomy    . Foot surgery Left     morton's neuroma  . Lasik      [name]  . Wisdom tooth extraction    . Lumbar disc surgery  07/2013    due to herniated disc, [name]  . Colonoscopy  2014    [name], baseline  . Esophagogastroduodenoscopy      [name]   Family History  Problem Relation Age of Onset  . Lupus Mother   . Hypertension Mother   . Hyperlipidemia Mother   . Migraines Mother   . Hypertension Father   . Diabetes Father   . Hyperlipidemia Father   . Obesity Sister   . Hypertension Sister   . Diabetes Sister   . Heart disease Paternal Uncle    CABG  . Hyperlipidemia Paternal Uncle   . Cancer Neg Hx   . Stroke Neg Hx    History  Substance Use Topics  . Smoking status: Never Smoker   . Smokeless tobacco: Not on file  . Alcohol Use: Yes     Comment: socially   OB History    No data available     Review of Systems  All other systems reviewed and are negative.     Allergies  Sulfa antibiotics  Home Medications   Prior to Admission medications   Medication Sig Start Date End Date Taking? Authorizing Provider  albuterol (PROVENTIL HFA;VENTOLIN HFA) 108 (90 BASE) MCG/ACT inhaler Inhale 2 puffs into the lungs every 6 (six) hours as needed for wheezing or shortness of breath. 08/09/13   Camelia Eng Tysinger, PA-C  azelastine (ASTELIN) 137 MCG/SPRAY nasal spray  07/03/13   Historical Provider, MD  diazepam (VALIUM) 5 MG tablet  07/25/13   Historical Provider, MD  EPINEPHrine (EPI-PEN) 0.3 mg/0.3 mL SOAJ injection Inject 0.3 mg into the muscle once.    Historical Provider, MD  esomeprazole (NEXIUM) 40 MG capsule Take 40 mg by mouth daily before breakfast.    Historical Provider, MD  fluticasone Asencion Islam)  50 MCG/ACT nasal spray Place into both nostrils daily.    Historical Provider, MD  ibuprofen (ADVIL,MOTRIN) 200 MG tablet Take 400 mg by mouth every 6 (six) hours as needed for moderate pain.    Historical Provider, MD  levocetirizine (XYZAL) 5 MG tablet Take 5 mg by mouth every evening.    Historical Provider, MD  loratadine (CLARITIN) 10 MG tablet Take 10 mg by mouth daily.    Historical Provider, MD  Melatonin 10 MG CAPS Take by mouth.    Historical Provider, MD  methylPREDNIsolone (MEDROL DOSPACK) 4 MG tablet follow package directions 12/20/13   Camelia Eng Tysinger, PA-C  montelukast (SINGULAIR) 10 MG tablet Take 10 mg by mouth at bedtime.    Historical Provider, MD  phentermine (ADIPEX-P) 37.5 MG tablet Take 1 tablet (37.5 mg total) by mouth daily before breakfast. Only taking half 12/20/13   Camelia Eng Tysinger, PA-C  pravastatin  (PRAVACHOL) 40 MG tablet Take 1 tablet (40 mg total) by mouth daily. 08/15/13   Camelia Eng Tysinger, PA-C  sertraline (ZOLOFT) 100 MG tablet Take 1 tablet (100 mg total) by mouth daily. 02/28/14   Camelia Eng Tysinger, PA-C  tolterodine (DETROL) 2 MG tablet Take 2 mg by mouth at bedtime.     Historical Provider, MD  traMADol (ULTRAM) 50 MG tablet  06/28/13   Historical Provider, MD   BP 138/88 mmHg  Pulse 81  Temp(Src) 98.2 F (36.8 C) (Oral)  Resp 20  Ht 5\' 10"  (1.778 m)  Wt 219 lb (99.338 kg)  BMI 31.42 kg/m2  SpO2 100%  LMP 04/05/2014 Physical Exam  Constitutional: She appears well-developed and well-nourished.  HENT:  Head: Normocephalic and atraumatic.  Cardiovascular: Normal rate and regular rhythm.   Pulmonary/Chest: Effort normal and breath sounds normal.  Tender with palpation  Abdominal: Soft. Bowel sounds are normal. There is no tenderness.  Neurological: She is alert.  Skin: Skin is warm and dry.  Psychiatric: She has a normal mood and affect.  Nursing note and vitals reviewed.   ED Course  Procedures (including critical care time) Labs Review Labs Reviewed  CBC WITH DIFFERENTIAL - Abnormal; Notable for the following:    MCH 25.4 (*)    RDW 15.6 (*)    All other components within normal limits  BASIC METABOLIC PANEL - Abnormal; Notable for the following:    Glucose, Bld 110 (*)    All other components within normal limits  TROPONIN I    Imaging Review Dg Chest 2 View  04/09/2014   CLINICAL DATA:  Sudden onset chest pain radiating into neck.  EXAM: CHEST  2 VIEW  COMPARISON:  12/14/2012  FINDINGS: The heart size and mediastinal contours are within normal limits. Both lungs are clear. The visualized skeletal structures are unremarkable.  IMPRESSION: No active cardiopulmonary disease.   Electronically Signed   By: Marin Olp M.D.   On: 04/09/2014 15:31     Date: 04/09/2014  Rate: 79  Rhythm: normal sinus rhythm  QRS Axis: normal  Intervals: normal  ST/T Wave  abnormalities: normal  Conduction Disutrbances:none  Narrative Interpretation:   Old EKG Reviewed: unchanged    MDM   Final diagnoses:  Chest pain  Atypical chest pain    Considered pe pt perc and wells negative. Pt is pain free at this time without any intervention  Pt left with Alecia Lemming PA for second trop  Gl[place]ell Docker, NP 04/09/14 [place], MD 04/09/14 1749

## 2014-04-09 NOTE — ED Provider Notes (Signed)
Complains of anterior chest pain rating to her neck onset 12:45 PM today pain was dull, crushing accompanied by shortness of breath nausea and sweatinessshortness of breath and nausea. Pain worse with raising her arms above her head presently pain is mild, without treatment. On exam no distress lungs clear auscultation heart regular rate and rhythm no murmurs abdomen nondistended nontender extremities without edema. Doubt pulmonary embolism. Low clinical suspicion. Wells score 0, Perc negative.heart score equals1- 2 based on story,and one cardiac risk factor i.e. Highly hypercholesterolemia  Orlie Dakin, MD 04/09/14 [id number]

## 2014-04-11 ENCOUNTER — Other Ambulatory Visit (HOSPITAL_COMMUNITY): Payer: Self-pay | Admitting: Obstetrics and Gynecology

## 2014-04-11 DIAGNOSIS — Z1231 Encounter for screening mammogram for malignant neoplasm of breast: Secondary | ICD-10-CM

## 2014-04-20 ENCOUNTER — Ambulatory Visit (HOSPITAL_COMMUNITY)
Admission: RE | Admit: 2014-04-20 | Discharge: 2014-04-20 | Disposition: A | Discharge status: Home / Self Care | Payer: BC Managed Care – PPO | Source: Ambulatory Visit | Attending: Obstetrics and Gynecology | Admitting: Obstetrics and Gynecology

## 2014-04-20 DIAGNOSIS — Z1231 Encounter for screening mammogram for malignant neoplasm of breast: Secondary | ICD-10-CM | POA: Insufficient documentation

## 2014-05-11 ENCOUNTER — Telehealth: Payer: Self-pay | Admitting: Internal Medicine

## 2014-05-11 NOTE — Telephone Encounter (Signed)
Pt called stating his GI docotor will not refill nexium 40mg  anymore and wants to know if you will refill it and send it to [facility]

## 2014-05-14 NOTE — Telephone Encounter (Signed)
She is due for f/u appt, possibly a physical.  Please schedule and we can review Nexium/GERD.

## 2014-05-14 NOTE — Telephone Encounter (Signed)
L/m for pt to call. Needs appt per shane.

## 2014-05-23 ENCOUNTER — Ambulatory Visit (INDEPENDENT_AMBULATORY_CARE_PROVIDER_SITE_OTHER): Payer: BC Managed Care – PPO | Admitting: Medical

## 2014-05-23 ENCOUNTER — Encounter: Payer: Self-pay | Admitting: Medical

## 2014-05-23 VITALS — BP 100/80 | HR 81 | Temp 98.1°F | Resp 16 | Wt 210.0 lb

## 2014-05-23 DIAGNOSIS — E785 Hyperlipidemia, unspecified: Secondary | ICD-10-CM

## 2014-05-23 DIAGNOSIS — Z79899 Other long term (current) drug therapy: Secondary | ICD-10-CM

## 2014-05-23 DIAGNOSIS — R945 Abnormal results of liver function studies: Principal | ICD-10-CM

## 2014-05-23 DIAGNOSIS — R10A0 Flank pain, unspecified side: Secondary | ICD-10-CM

## 2014-05-23 DIAGNOSIS — K760 Fatty (change of) liver, not elsewhere classified: Secondary | ICD-10-CM

## 2014-05-23 DIAGNOSIS — D500 Iron deficiency anemia secondary to blood loss (chronic): Secondary | ICD-10-CM

## 2014-05-23 DIAGNOSIS — R7989 Other specified abnormal findings of blood chemistry: Secondary | ICD-10-CM

## 2014-05-23 DIAGNOSIS — K219 Gastro-esophageal reflux disease without esophagitis: Secondary | ICD-10-CM

## 2014-05-23 DIAGNOSIS — R109 Unspecified abdominal pain: Secondary | ICD-10-CM

## 2014-05-23 DIAGNOSIS — N920 Excessive and frequent menstruation with regular cycle: Secondary | ICD-10-CM

## 2014-05-23 DIAGNOSIS — E669 Obesity, unspecified: Secondary | ICD-10-CM

## 2014-05-23 LAB — COMPREHENSIVE METABOLIC PANEL
ALBUMIN: 4.1 g/dL (ref 3.5–5.2)
ALK PHOS: 81 U/L (ref 39–117)
ALT: 29 U/L (ref 0–35)
AST: 28 U/L (ref 0–37)
BILIRUBIN TOTAL: 0.3 mg/dL (ref 0.2–1.2)
BUN: 12 mg/dL (ref 6–23)
CO2: 23 meq/L (ref 19–32)
Calcium: 9.2 mg/dL (ref 8.4–10.5)
Chloride: 103 mEq/L (ref 96–112)
Creat: 0.73 mg/dL (ref 0.50–1.10)
Glucose, Bld: 90 mg/dL (ref 70–99)
POTASSIUM: 4.4 meq/L (ref 3.5–5.3)
Sodium: 139 mEq/L (ref 135–145)
TOTAL PROTEIN: 6.9 g/dL (ref 6.0–8.3)

## 2014-05-23 LAB — LIPID PANEL
Cholesterol: 220 mg/dL — ABNORMAL HIGH (ref 0–200)
HDL: 78 mg/dL (ref >39)
LDL Cholesterol: 129 mg/dL — ABNORMAL HIGH (ref 0–99)
Total CHOL/HDL Ratio: 2.8 Ratio
Triglycerides: 64 mg/dL (ref <150)
VLDL: 13 mg/dL (ref 0–40)

## 2014-05-23 LAB — IRON: Iron: 26 ug/dL — ABNORMAL LOW (ref 42–145)

## 2014-05-23 MED ORDER — ESOMEPRAZOLE MAGNESIUM 40 MG PO CPDR: 40.0000 mg | DELAYED_RELEASE_CAPSULE | Freq: Every day | ORAL | Status: DC

## 2014-05-23 NOTE — Progress Notes (Signed)
Subjective: Here for follow-up on several issues  At her last visit in July, we discussed several things at that time, she had blood work at that time. Her liver tests were elevated, and after additional labs to evaluate this iron was found to be low. The abdominal ultrasound in September showed fatty liver disease.  Iron deficiency-since last visit had tried multivitamin with iron, but this constipates her so bad she can't take the iron.  Has had longstanding constipation.  Has heavy periods, sees gynecology, considering ablation  Obesity-at last visit was taking Phentermine, working on exercise regularly and healthy diet.  Hasn't taken the phentermine in 2-3 months given some fluctuating BPs.  Ended up going to the hospital for chest pain, had negative cardiac markers.   Was stress related. Since last visit has changed jobs.  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    Hyperlipidemia - she was having flank pain at her last visit and attributes this to her cholesterol medication. Given the liver tests being elevated she is also worried about the cholesterol medications. She was on pravastatin prior to this  GERD - wants me to take over this medication, Nexium.   She is aware of risks/benefits and diet trigger avoidance.  ROS as in subjective  Objective: BP 100/80 mmHg  Pulse 81  Temp(Src) 98.1 F (36.7 C) (Oral)  Resp 16  Wt 210 lb (95.255 kg)  Wt Readings from Last 3 Encounters:  05/23/14 210 lb (95.255 kg)  04/09/14 219 lb (99.338 kg)  12/20/13 219 lb (99.338 kg)    General appearance: alert, no distress, WD/WN Neck: supple, no lymphadenopathy, no thyromegaly, no masses Heart: RRR, normal S1, S2, no murmurs Lungs: CTA bilaterally, no wheezes, rhonchi, or rales Abdomen: +bs, soft, non tender, non distended, no masses, no hepatomegaly, no splenomegaly Pulses: 2+ symmetric, upper and lower extremities, normal cap refill Ext: no  edema   Assessment: Encounter Diagnoses  Name Primary?  . Elevated LFTs Yes  . Obesity   . Fatty liver disease, nonalcoholic   . Hyperlipidemia   . Iron deficiency anemia due to chronic blood loss   . Menorrhagia with regular cycle   . High risk medication use   . Gastroesophageal reflux disease without esophagitis   . Flank pain    Plan: Elevated liver tests most likely due to fatty liver disease, recheck labs today Obesity-she will consider restarting phentermine but wants to hold off at this time, has lost 10 pounds since last visit, continues to work on healthy diet and exercise Fatty liver disease-discussed diagnosis, possible complications, and continue efforts at weight loss and healthy diet Hyperlipidemia-currently not taking medication, recheck lab today Iron deficiency-most likely due to chronic blood loss heavy periods, doesn't tolerate oral iron, recheck iron level at her request today Heavy periods, menorrhagia, advise she follow-up with gynecology concerning this High risk medication use, GERD-discussed risk and benefits of medication including risks of osteoporosis. Avoid diet triggers, continue Nexium Flank pain - resolved from last visit

## 2014-05-24 ENCOUNTER — Other Ambulatory Visit: Payer: Self-pay | Admitting: Medical

## 2014-05-24 LAB — HEMOGLOBIN A1C
Hgb A1c MFr Bld: 5.8 % — ABNORMAL HIGH (ref <5.7)
MEAN PLASMA GLUCOSE: 120 mg/dL — AB (ref <117)

## 2014-05-24 LAB — VITAMIN D 25 HYDROXY (VIT D DEFICIENCY, FRACTURES): Vit D, 25-Hydroxy: 10 ng/mL — ABNORMAL LOW (ref 30–100)

## 2014-05-24 MED ORDER — FERROUS FUM-IRON POLYSACCH 162-115.2 MG PO CAPS: 1.0000 | ORAL_CAPSULE | Freq: Every day | ORAL | Status: DC

## 2014-05-24 MED ORDER — VITAMIN D (ERGOCALCIFEROL) 1.25 MG (50000 UNIT) PO CAPS: 50000.0000 [IU] | ORAL_CAPSULE | ORAL | Status: DC

## 2014-05-29 ENCOUNTER — Ambulatory Visit (INDEPENDENT_AMBULATORY_CARE_PROVIDER_SITE_OTHER): Payer: BC Managed Care – PPO | Admitting: Family Medicine

## 2014-05-29 ENCOUNTER — Encounter: Payer: Self-pay | Admitting: Family Medicine

## 2014-05-29 VITALS — BP 114/82 | HR 83 | Wt 215.0 lb

## 2014-05-29 DIAGNOSIS — H109 Unspecified conjunctivitis: Secondary | ICD-10-CM

## 2014-05-29 MED ORDER — ERYTHROMYCIN 5 MG/GM OP OINT: 1.0000 "application " | TOPICAL_OINTMENT | Freq: Three times a day (TID) | OPHTHALMIC | Status: DC

## 2014-05-29 NOTE — Progress Notes (Signed)
   Subjective:    Patient ID: Michelle Griffin, female    DOB: 1970/08/19, 44 y.o.   MRN: [id number]  HPI She woke up 2 days ago with crusting from both eyes of a greenish yellow material. During the day she had low difficulty with that. She again woke up this morning with crusting in both eyes.   Review of Systems     Objective:   Physical Exam Exam of both eyes shows the conjunctiva to be normal with no surrounding erythema or drainage. TMs normal. Neck is supple without adenopathy.       Assessment & Plan:  Bilateral conjunctivitis - Plan: erythromycin (ROMYCIN) ophthalmic ointment  I will go ahead and treat her since she symptomatically has what appears to be a bacterial conjunctivitis. Talked about good hand washing as well.

## 2014-06-04 ENCOUNTER — Other Ambulatory Visit: Payer: Self-pay | Admitting: Medical

## 2014-06-04 ENCOUNTER — Telehealth: Payer: Self-pay | Admitting: Internal Medicine

## 2014-06-04 MED ORDER — NEOMYCIN-POLYMYXIN-HC 3.5-10000-1 OP SUSP: 3.0000 [drp] | Freq: Four times a day (QID) | OPHTHALMIC | Status: DC

## 2014-06-04 NOTE — Telephone Encounter (Signed)
Pt states that her medicine for her eye is not working that she is still having some itching and its still crusting over. Wants to know if you will send something in to [facility]

## 2014-06-08 ENCOUNTER — Telehealth: Payer: Self-pay | Admitting: Family Medicine

## 2014-06-08 NOTE — Telephone Encounter (Signed)
I have used too good medicines on this without much success. Help her get into see an ophthalmologist

## 2014-06-08 NOTE — Telephone Encounter (Signed)
Pt called and stated that medication she was given for her eye is making it worse. Pt was offered an appt but declined. Pt is requesting something else be called in. This needs to go to a [facility]. Please send to [facility]. Pt can be reached at [phone].

## 2014-06-08 NOTE — Telephone Encounter (Signed)
Told pt that she would need to go see an eye doctor for this. Gave pt a few contacts to call to get an appt with

## 2014-07-10 ENCOUNTER — Encounter (HOSPITAL_COMMUNITY): Payer: Self-pay | Admitting: *Deleted

## 2014-07-10 ENCOUNTER — Emergency Department (INDEPENDENT_AMBULATORY_CARE_PROVIDER_SITE_OTHER)
Admission: EM | Admit: 2014-07-10 | Discharge: 2014-07-10 | Disposition: A | Discharge status: Home / Self Care | Payer: 59 | Source: Home / Self Care | Attending: Family Medicine | Admitting: Family Medicine

## 2014-07-10 DIAGNOSIS — J069 Acute upper respiratory infection, unspecified: Secondary | ICD-10-CM

## 2014-07-10 LAB — POCT RAPID STREP A: STREPTOCOCCUS, GROUP A SCREEN (DIRECT): NEGATIVE

## 2014-07-10 MED ORDER — IPRATROPIUM BROMIDE 0.06 % NA SOLN: 2.0000 | Freq: Four times a day (QID) | NASAL | Status: DC

## 2014-07-10 NOTE — ED Provider Notes (Signed)
CSN: [id number]     Arrival date & time 07/10/14  1356 History   First MD Initiated Contact with Patient 07/10/14 1457     Chief Complaint  Patient presents with  . Sore Throat   (Consider location/radiation/quality/duration/timing/severity/associated sxs/prior Treatment) Patient is a 44 y.o. female presenting with URI.  URI Presenting symptoms: congestion, cough, rhinorrhea and sore throat   Presenting symptoms: no fever   Severity:  Mild Onset quality:  Gradual Duration:  1 week Progression:  Unchanged Chronicity:  New Relieved by:  None tried Worsened by:  Nothing tried Ineffective treatments:  None tried   Past Medical History  Diagnosis Date  . Allergy   . Asthma   . Hyperlipidemia 2012    was on pravastatin prior  . Anemia     hx/o iron deficiency due to heavy periods  . Migraine     prior eval with neurologist, Dr. Melton Alar  . GERD (gastroesophageal reflux disease)   . Pyelonephritis     age 9yo  . DDD (degenerative disc disease), lumbar   . Hiatal hernia     small per EGD, [name]  . H/O mammogram 03/2013  . Routine gynecological examination     pap 2013, [facility]  . Overactive bladder   . PMDD (premenstrual dysphoric disorder)    Past Surgical History  Procedure Laterality Date  . Cholecystectomy    . Foot surgery Left     morton's neuroma  . Lasik      [name]  . Wisdom tooth extraction    . Lumbar disc surgery  07/2013    due to herniated disc, [name]  . Colonoscopy  2014    [name], baseline  . Esophagogastroduodenoscopy      [name]   Family History  Problem Relation Age of Onset  . Lupus Mother   . Hypertension Mother   . Hyperlipidemia Mother   . Migraines Mother   . Hypertension Father   . Diabetes Father   . Hyperlipidemia Father   . Obesity Sister   . Hypertension Sister   . Diabetes Sister   . Heart disease Paternal Uncle     CABG  . Hyperlipidemia Paternal Uncle   . Cancer Neg Hx   . Stroke Neg Hx     History  Substance Use Topics  . Smoking status: Never Smoker   . Smokeless tobacco: Not on file  . Alcohol Use: Yes     Comment: socially   OB History    No data available     Review of Systems  Constitutional: Negative.  Negative for fever.  HENT: Positive for congestion, postnasal drip, rhinorrhea and sore throat.   Respiratory: Positive for cough.   Cardiovascular: Negative.   Genitourinary: Negative.     Allergies  Sulfa antibiotics  Home Medications   Prior to Admission medications   Medication Sig Start Date End Date Taking? Authorizing Provider  albuterol (PROVENTIL HFA;VENTOLIN HFA) 108 (90 BASE) MCG/ACT inhaler Inhale 2 puffs into the lungs every 6 (six) hours as needed for wheezing or shortness of breath. 08/09/13   Camelia Eng Tysinger, PA-C  EPINEPHrine (EPI-PEN) 0.3 mg/0.3 mL SOAJ injection Inject 0.3 mg into the muscle once.    Historical Provider, MD  erythromycin [facility]) ophthalmic ointment Place 1 application into both eyes 3 (three) times daily. 05/29/14   Denita Lung, MD  esomeprazole (NEXIUM) 40 MG capsule Take 1 capsule (40 mg total) by mouth daily before breakfast. 05/23/14  Camelia Eng Tysinger, PA-C  ferrous fumarate-iron polysaccharide complex (TANDEM) 162-115.2 MG CAPS Take 1 capsule by mouth daily with breakfast. 05/24/14   Camelia Eng Tysinger, PA-C  fluticasone (FLONASE) 50 MCG/ACT nasal spray Place into both nostrils daily.    Historical Provider, MD  ibuprofen (ADVIL,MOTRIN) 200 MG tablet Take 400 mg by mouth every 6 (six) hours as needed for moderate pain.    Historical Provider, MD  ipratropium (ATROVENT) 0.06 % nasal spray Place 2 sprays into both nostrils 4 (four) times daily. 07/10/14   Billy Fischer, MD  levocetirizine (XYZAL) 5 MG tablet Take 5 mg by mouth every evening.    Historical Provider, MD  loratadine (CLARITIN) 10 MG tablet Take 10 mg by mouth daily.    Historical Provider, MD  montelukast (SINGULAIR) 10 MG tablet Take 10 mg by mouth at  bedtime.    Historical Provider, MD  neomycin-polymyxin-hydrocortisone (CORTISPORIN) 3.5-10000-1 ophthalmic suspension Place 3 drops into both eyes 4 (four) times daily. 06/04/14   Denita Lung, MD  sertraline (ZOLOFT) 100 MG tablet TAKE 1 TABLET BY MOUTH ONCE DAILY 06/04/14   Carlena Hurl, PA-C  tolterodine (DETROL) 2 MG tablet Take 2 mg by mouth at bedtime.     Historical Provider, MD  Vitamin D, Ergocalciferol, (DRISDOL) 50000 UNITS CAPS capsule Take 1 capsule (50,000 Units total) by mouth every 7 (seven) days. 05/24/14   Carlena Hurl, PA-C   LMP 06/22/2014 Physical Exam  Constitutional: She is oriented to person, place, and time. She appears well-developed and well-nourished. No distress.  HENT:  Head: Normocephalic.  Right Ear: External ear normal.  Left Ear: External ear normal.  Nose: Mucosal edema and rhinorrhea present.  Mouth/Throat: Oropharynx is clear and moist.  Eyes: Pupils are equal, round, and reactive to light.  Neck: Normal range of motion. Neck supple.  Cardiovascular: Normal rate, regular rhythm, normal heart sounds and intact distal pulses.   Pulmonary/Chest: Effort normal and breath sounds normal.  Lymphadenopathy:    She has no cervical adenopathy.  Neurological: She is alert and oriented to person, place, and time.  Skin: Skin is warm and dry.  Nursing note and vitals reviewed.   ED Course  Procedures (including critical care time) Labs Review Labs Reviewed  POCT RAPID STREP A ([facility] ONLY)   Strep neg. Imaging Review No results found.   MDM   1. URI (upper respiratory infection)        Billy Fischer, MD 07/10/14 [phone]

## 2014-07-10 NOTE — Discharge Instructions (Signed)
Drink plenty of fluids as discussed, use medicine as prescribed, and mucinex or delsym for cough. Return or see your doctor if further problems °

## 2014-07-10 NOTE — ED Notes (Signed)
C/o   sorethroat     And  Earache   For  sev  Days

## 2014-07-12 LAB — CULTURE, GROUP A STREP

## 2014-08-21 ENCOUNTER — Encounter: Payer: Self-pay | Admitting: Medical

## 2014-08-21 ENCOUNTER — Ambulatory Visit (INDEPENDENT_AMBULATORY_CARE_PROVIDER_SITE_OTHER): Payer: 59 | Admitting: Medical

## 2014-08-21 ENCOUNTER — Other Ambulatory Visit: Payer: Self-pay | Admitting: Medical

## 2014-08-21 VITALS — BP 120/80 | HR 92 | Resp 15 | Wt 221.0 lb

## 2014-08-21 DIAGNOSIS — R7989 Other specified abnormal findings of blood chemistry: Secondary | ICD-10-CM | POA: Diagnosis not present

## 2014-08-21 DIAGNOSIS — J309 Allergic rhinitis, unspecified: Secondary | ICD-10-CM

## 2014-08-21 DIAGNOSIS — E559 Vitamin D deficiency, unspecified: Secondary | ICD-10-CM

## 2014-08-21 DIAGNOSIS — R945 Abnormal results of liver function studies: Secondary | ICD-10-CM

## 2014-08-21 DIAGNOSIS — R7301 Impaired fasting glucose: Secondary | ICD-10-CM

## 2014-08-21 DIAGNOSIS — L259 Unspecified contact dermatitis, unspecified cause: Secondary | ICD-10-CM

## 2014-08-21 DIAGNOSIS — D509 Iron deficiency anemia, unspecified: Secondary | ICD-10-CM | POA: Diagnosis not present

## 2014-08-21 DIAGNOSIS — K219 Gastro-esophageal reflux disease without esophagitis: Secondary | ICD-10-CM

## 2014-08-21 LAB — HEPATIC FUNCTION PANEL
ALT: 27 U/L (ref 0–35)
AST: 24 U/L (ref 0–37)
Albumin: 4.3 g/dL (ref 3.5–5.2)
Alkaline Phosphatase: 96 U/L (ref 39–117)
BILIRUBIN DIRECT: 0.1 mg/dL (ref 0.0–0.3)
BILIRUBIN TOTAL: 0.3 mg/dL (ref 0.2–1.2)
Indirect Bilirubin: 0.2 mg/dL (ref 0.2–1.2)
Total Protein: 7.1 g/dL (ref 6.0–8.3)

## 2014-08-21 LAB — HEMOGLOBIN A1C
HEMOGLOBIN A1C: 5.7 % — AB (ref <5.7)
MEAN PLASMA GLUCOSE: 117 mg/dL — AB (ref <117)

## 2014-08-21 LAB — IRON: Iron: 34 ug/dL — ABNORMAL LOW (ref 42–145)

## 2014-08-21 MED ORDER — FAMOTIDINE 40 MG PO TABS: 40.0000 mg | ORAL_TABLET | Freq: Every day | ORAL | Status: DC

## 2014-08-21 MED ORDER — TRIAMCINOLONE ACETONIDE 0.1 % EX CREA: 1.0000 "application " | TOPICAL_CREAM | Freq: Two times a day (BID) | CUTANEOUS | Status: DC

## 2014-08-21 NOTE — Progress Notes (Signed)
Subjective: Here for f/u on vit d and iron deficiency, compliant what tandem for iron but gets some constipation.   Compliant with ergocalciferol 50000 weekly.   Eating healthy exercising.  Takes Nexium 40mg  due to GERD, but was on BID prior by GI.    Started using natural deodorant few days ago and now has rash in bilat axilla.  Has listed diagnosis of hyperlipidemia.  Stopped taking cholesterol medication this past year due to risks of liver damage.  Patient's cardiac risk factors are: family history of premature cardiovascular disease and obesity (BMI >= 30 kg/m2).   Objective; BP 120/80 mmHg  Pulse 92  Resp 15  Wt 221 lb (100.245 kg)   Gen: wdwn,nad Skin: pink raised urticarial patches in bilat axilla Lungs clear Heart rrr, normal s1, s2, no murmurs Pulses norma   Assessment: Encounter Diagnoses  Name Primary?  . Gastroesophageal reflux disease without esophagitis Yes  . Allergic rhinitis, unspecified allergic rhinitis type   . Contact dermatitis   . Vitamin D deficiency   . Anemia, iron deficiency   . Elevated LFTs   . Impaired fasting blood sugar    Plan GERD - trial of H2 blocker instead of PPI given long term risks of PPI and deficiencies, avoid GERD triggers, discussed preventative measures allergi rhinitis - followed by allergist contact dermatitis - stop new deodorant, use triamcinolone topical short term, then go back to prior normal deodorant Vit D deficiency - completed 3 mo of ergocalciferol 50000 weekly.  Lab today Iron deficiency - compliant with tandem, heavy periods, does get some constipation Elevated LFTs from last yeawr, repeat lab today impaired  Glucose - c/t efforts at health diet, exercise, and recheck HgbA1C at her request today

## 2014-08-22 ENCOUNTER — Other Ambulatory Visit: Payer: Self-pay | Admitting: Medical

## 2014-08-22 LAB — VITAMIN D 25 HYDROXY (VIT D DEFICIENCY, FRACTURES): Vit D, 25-Hydroxy: 22 ng/mL — ABNORMAL LOW (ref 30–100)

## 2014-08-22 MED ORDER — FERROUS FUM-IRON POLYSACCH 162-115.2 MG PO CAPS: 1.0000 | ORAL_CAPSULE | Freq: Every day | ORAL | Status: DC

## 2014-08-22 MED ORDER — VITAMIN D (ERGOCALCIFEROL) 1.25 MG (50000 UNIT) PO CAPS: 50000.0000 [IU] | ORAL_CAPSULE | ORAL | Status: DC

## 2014-11-16 ENCOUNTER — Other Ambulatory Visit: Payer: Self-pay | Admitting: Medical

## 2014-12-06 ENCOUNTER — Encounter: Payer: Self-pay | Admitting: Medical

## 2014-12-06 ENCOUNTER — Ambulatory Visit (INDEPENDENT_AMBULATORY_CARE_PROVIDER_SITE_OTHER): Payer: 59 | Admitting: Medical

## 2014-12-06 VITALS — BP 118/78 | HR 74 | Temp 97.9°F | Resp 16 | Wt 233.0 lb

## 2014-12-06 DIAGNOSIS — I889 Nonspecific lymphadenitis, unspecified: Secondary | ICD-10-CM | POA: Diagnosis not present

## 2014-12-06 DIAGNOSIS — N951 Menopausal and female climacteric states: Secondary | ICD-10-CM | POA: Diagnosis not present

## 2014-12-06 DIAGNOSIS — R232 Flushing: Secondary | ICD-10-CM

## 2014-12-06 LAB — CBC WITH DIFFERENTIAL/PLATELET
Basophils Absolute: 0.1 10*3/uL (ref 0.0–0.1)
Basophils Relative: 1 % (ref 0–1)
EOS ABS: 0.1 10*3/uL (ref 0.0–0.7)
EOS PCT: 1 % (ref 0–5)
HEMATOCRIT: 43.5 % (ref 36.0–46.0)
HEMOGLOBIN: 14.9 g/dL (ref 12.0–15.0)
LYMPHS ABS: 1.5 10*3/uL (ref 0.7–4.0)
LYMPHS PCT: 27 % (ref 12–46)
MCH: 31 pg (ref 26.0–34.0)
MCHC: 34.3 g/dL (ref 30.0–36.0)
MCV: 90.6 fL (ref 78.0–100.0)
MONOS PCT: 14 % — AB (ref 3–12)
MPV: 8.9 fL (ref 8.6–12.4)
Monocytes Absolute: 0.8 10*3/uL (ref 0.1–1.0)
Neutro Abs: 3.2 10*3/uL (ref 1.7–7.7)
Neutrophils Relative %: 57 % (ref 43–77)
Platelets: 263 10*3/uL (ref 150–400)
RBC: 4.8 MIL/uL (ref 3.87–5.11)
RDW: 14.1 % (ref 11.5–15.5)
WBC: 5.7 10*3/uL (ref 4.0–10.5)

## 2014-12-06 NOTE — Progress Notes (Signed)
Subjective: Here for 2 issues  She notes starting allergy shots a little over a month ago.  Since then has started noticing tender lymph nodes in left neck behind the ear.   This started after allergy shots.  otherwise hasn't been sick, no recent fever, chills, sweats or weight loss, no other lymph nodes enlarged or sore, no rash.  Using nothing for this.   She got worried as an acquaintance's mother just passed away of cancer after first discovering lymph node enlarged.  No other aggravating or relieving factors. No other complaint.  She has had some hot flashes . Sees gyn.  Not on birth control, still gets periods regularly.  Wants hormone level checked.    Past Medical History  Diagnosis Date  . Allergy   . Asthma   . Hyperlipidemia 2012    was on pravastatin prior  . Anemia     hx/o iron deficiency due to heavy periods  . Migraine     prior eval with neurologist, Dr. Melton Alar  . GERD (gastroesophageal reflux disease)   . Pyelonephritis     age 44yo  . DDD (degenerative disc disease), lumbar   . Hiatal hernia     small per EGD, [name]  . H/O mammogram 03/2013  . Routine gynecological examination     pap 2013, [facility]  . Overactive bladder   . PMDD (premenstrual dysphoric disorder)     Objective: BP 118/78 mmHg  Pulse 74  Temp(Src) 97.9 F (36.6 C) (Oral)  Resp 16  Wt 233 lb (105.688 kg)  LMP 12/06/2014 (Approximate)   General appearance: alert, no distress, WD/WN HEENT: normocephalic, sclerae anicteric, TMs pearly, nares patent, no discharge or erythema, pharynx normal Oral cavity: MMM, no lesions Neck: left posterior neck just posterior to ear with tender shoddy node, not enlarged, otherwise neck supple, no lymphadenopathy, no thyromegaly, no masses, specifically no supraclavicular nodes Heart: RRR, normal S1, S2, no murmurs Lungs: CTA bilaterally, no wheezes, rhonchi, or rales    Assessment: Encounter Diagnoses  Name Primary?  . Lymphadenitis Yes   . Hot flashes     Plan: Lymphadenitis - discussed that the area doesn't seem to have enlargement of lymph node but rather tender small inflamed node.   Advised some OTC NSAID, hydrate well, and CBC today to reassure/further eval.  Hot flashes - hormone level today at her request . She is still having regular periods.  No other c/o related to this.

## 2014-12-07 LAB — FSH/LH
FSH: 8.5 m[IU]/mL
LH: 5.8 m[IU]/mL

## 2015-01-02 ENCOUNTER — Encounter: Payer: Self-pay | Admitting: Medical

## 2015-01-03 ENCOUNTER — Encounter: Payer: Self-pay | Admitting: Family Medicine

## 2015-01-03 ENCOUNTER — Other Ambulatory Visit: Payer: Self-pay | Admitting: Medical

## 2015-01-03 DIAGNOSIS — R591 Generalized enlarged lymph nodes: Secondary | ICD-10-CM

## 2015-01-04 ENCOUNTER — Ambulatory Visit (HOSPITAL_BASED_OUTPATIENT_CLINIC_OR_DEPARTMENT_OTHER)
Admission: RE | Admit: 2015-01-04 | Discharge: 2015-01-04 | Disposition: A | Discharge status: Home / Self Care | Payer: 59 | Source: Ambulatory Visit | Attending: Medical | Admitting: Medical

## 2015-01-04 DIAGNOSIS — R591 Generalized enlarged lymph nodes: Secondary | ICD-10-CM

## 2015-01-04 DIAGNOSIS — R599 Enlarged lymph nodes, unspecified: Secondary | ICD-10-CM | POA: Diagnosis not present

## 2015-01-05 ENCOUNTER — Encounter: Payer: 59 | Attending: Medical | Admitting: Skilled Nursing Facility1

## 2015-01-05 VITALS — Ht 69.0 in | Wt 233.0 lb

## 2015-01-05 DIAGNOSIS — Z713 Dietary counseling and surveillance: Secondary | ICD-10-CM | POA: Diagnosis not present

## 2015-01-05 NOTE — Progress Notes (Signed)
Subjective:     Patient ID: Farha H Mickley, female   DOB: 04-Nov-1970, 44 y.o.   MRN: [id number]  HPI   Review of Systems     Objective:   Physical Exam     Assessment:     .[email or web address]  Patient was seen on 01/05/15 for the Weight Loss Class at the [facility]. The following learning objectives were met by the patient during this class:   Describe healthy choices in each food group  Describe portion size of foods  Use plate method for meal planning  Demonstrate how to read Nutrition Facts food label  Set realistic goals for weight loss, diet changes, and physical activity.   Goals:  1. Make healthy food choices in each food group.  2. Reduce portion size of foods.  3. Increase fruit and vegetable intake.  4. Use plate method for meal planning.  5. Increase physical activity.    Handouts given:   1. Nutrition Strategies for Weight Loss   2. Meal plan/portion card   3. MyPlate Planner   4. Weight Management Recipe Resources   5. Efraim Kaufmann      Plan:

## 2015-01-24 ENCOUNTER — Telehealth: Payer: Self-pay

## 2015-01-24 NOTE — Telephone Encounter (Signed)
Pt called saying the left side of her back was hurting and she wants a steroid dose pack and mobic for pain and swelling.

## 2015-01-25 ENCOUNTER — Other Ambulatory Visit: Payer: Self-pay | Admitting: Medical

## 2015-01-25 MED ORDER — MELOXICAM 15 MG PO TABS: 15.0000 mg | ORAL_TABLET | Freq: Every day | ORAL | Status: DC

## 2015-01-25 NOTE — Telephone Encounter (Signed)
I can't prescribe a steroid without seeing her.   I did send Mobic assuming she pulled a muscle or had a good reason to have musculoskeletal back pain.  If she thinks something else is going on such as UTI or worried of some other cause of back pain, then come in for eval.

## 2015-01-25 NOTE — Telephone Encounter (Signed)
Left word for word message on pt preferred #

## 2015-01-28 ENCOUNTER — Ambulatory Visit: Payer: 59 | Admitting: Medical

## 2015-02-08 ENCOUNTER — Other Ambulatory Visit: Payer: Self-pay | Admitting: Medical

## 2015-02-08 NOTE — Telephone Encounter (Signed)
Is this okay to refill? 

## 2015-02-13 ENCOUNTER — Ambulatory Visit (INDEPENDENT_AMBULATORY_CARE_PROVIDER_SITE_OTHER): Payer: 59 | Admitting: Medical

## 2015-02-13 ENCOUNTER — Encounter: Payer: Self-pay | Admitting: Medical

## 2015-02-13 VITALS — BP 118/90 | HR 80 | Temp 98.2°F | Resp 18 | Ht 69.0 in | Wt 237.4 lb

## 2015-02-13 DIAGNOSIS — K219 Gastro-esophageal reflux disease without esophagitis: Secondary | ICD-10-CM | POA: Diagnosis not present

## 2015-02-13 DIAGNOSIS — J4530 Mild persistent asthma, uncomplicated: Secondary | ICD-10-CM | POA: Diagnosis not present

## 2015-02-13 DIAGNOSIS — N943 Premenstrual tension syndrome: Secondary | ICD-10-CM

## 2015-02-13 DIAGNOSIS — Z0000 Encounter for general adult medical examination without abnormal findings: Secondary | ICD-10-CM

## 2015-02-13 DIAGNOSIS — K760 Fatty (change of) liver, not elsewhere classified: Secondary | ICD-10-CM | POA: Diagnosis not present

## 2015-02-13 DIAGNOSIS — E785 Hyperlipidemia, unspecified: Secondary | ICD-10-CM | POA: Diagnosis not present

## 2015-02-13 DIAGNOSIS — Z13220 Encounter for screening for lipoid disorders: Secondary | ICD-10-CM | POA: Insufficient documentation

## 2015-02-13 DIAGNOSIS — D509 Iron deficiency anemia, unspecified: Secondary | ICD-10-CM

## 2015-02-13 DIAGNOSIS — L237 Allergic contact dermatitis due to plants, except food: Secondary | ICD-10-CM | POA: Diagnosis not present

## 2015-02-13 DIAGNOSIS — J309 Allergic rhinitis, unspecified: Secondary | ICD-10-CM | POA: Diagnosis not present

## 2015-02-13 DIAGNOSIS — R7301 Impaired fasting glucose: Secondary | ICD-10-CM

## 2015-02-13 DIAGNOSIS — N3281 Overactive bladder: Secondary | ICD-10-CM

## 2015-02-13 DIAGNOSIS — E559 Vitamin D deficiency, unspecified: Secondary | ICD-10-CM | POA: Diagnosis not present

## 2015-02-13 DIAGNOSIS — F3281 Premenstrual dysphoric disorder: Secondary | ICD-10-CM | POA: Insufficient documentation

## 2015-02-13 DIAGNOSIS — F1010 Alcohol abuse, uncomplicated: Secondary | ICD-10-CM

## 2015-02-13 LAB — CBC
HCT: 42.6 % (ref 36.0–46.0)
Hemoglobin: 15.1 g/dL — ABNORMAL HIGH (ref 12.0–15.0)
MCH: 32 pg (ref 26.0–34.0)
MCHC: 35.4 g/dL (ref 30.0–36.0)
MCV: 90.3 fL (ref 78.0–100.0)
MPV: 9 fL (ref 8.6–12.4)
PLATELETS: 216 10*3/uL (ref 150–400)
RBC: 4.72 MIL/uL (ref 3.87–5.11)
RDW: 13.1 % (ref 11.5–15.5)
WBC: 6.7 10*3/uL (ref 4.0–10.5)

## 2015-02-13 LAB — POCT URINALYSIS DIPSTICK
BILIRUBIN UA: NEGATIVE
Blood, UA: NEGATIVE
Glucose, UA: NEGATIVE
KETONES UA: NEGATIVE
NITRITE UA: NEGATIVE
PROTEIN UA: NEGATIVE
Spec Grav, UA: 1.02
Urobilinogen, UA: NEGATIVE
pH, UA: 6

## 2015-02-13 MED ORDER — NALTREXONE HCL 50 MG PO TABS: 25.0000 mg | ORAL_TABLET | Freq: Every day | ORAL | Status: DC

## 2015-02-13 MED ORDER — SERTRALINE HCL 100 MG PO TABS: 100.0000 mg | ORAL_TABLET | Freq: Every day | ORAL | Status: DC

## 2015-02-13 MED ORDER — MELOXICAM 15 MG PO TABS: 15.0000 mg | ORAL_TABLET | Freq: Every day | ORAL | Status: DC

## 2015-02-13 MED ORDER — ESOMEPRAZOLE MAGNESIUM 40 MG PO CPDR: 40.0000 mg | DELAYED_RELEASE_CAPSULE | Freq: Every day | ORAL | Status: DC

## 2015-02-13 MED ORDER — METHYLPREDNISOLONE SODIUM SUCC 125 MG IJ SOLR: 125.0000 mg | Freq: Once | INTRAMUSCULAR | Status: DC

## 2015-02-13 NOTE — Progress Notes (Signed)
Subjective:   HPI  Michelle Griffin is a 44 y.o. female who presents for a complete physical.  Medical care team includes:  [facility]  [name], Allergist  Dr. Jolayne Panther, Dentist  [name] - Back specialist/Orthopedist  Dorothea Ogle, PA-C here for primary care   Preventative care: Last ophthalmology visit:yes Dr. Joaquim Lai Last dental visit:yes Dr. Jolayne Panther Last colonoscopy:few years ago Last mammogram: 2015 Last gynecological exam:2016  Prior vaccinations: TD or Tdap: 6 years ago Influenza:yearly at work Pneumococcal:n/a Shingles/Zostavax:n/a  Concerns: Got into some poison ivy, and can't get rid of it despite triamcinolone cream.  Itching like crazy.  Reviewed their medical, surgical, family, social, medication, and allergy history and updated chart as appropriate.  Past Medical History  Diagnosis Date  . Asthma   . Hyperlipidemia 2012    was on pravastatin prior  . Anemia     hx/o iron deficiency due to heavy periods  . Migraine     prior eval with neurologist, Dr. Melton Alar  . Pyelonephritis     age 24yo  . DDD (degenerative disc disease), lumbar   . Hiatal hernia     small per EGD, [name]  . H/O mammogram 03/2013  . Routine gynecological examination     [place], [name], NP  . Overactive bladder   . PMDD (premenstrual dysphoric disorder)   . Allergy     sees [name]  . GERD (gastroesophageal reflux disease)     [name]  . Vitamin D deficiency   . Fatty liver   . OAB (overactive bladder)   . Wears glasses     Past Surgical History  Procedure Laterality Date  . Cholecystectomy    . Foot surgery Left     morton's neuroma  . Lasik      [name]  . Wisdom tooth extraction    . Lumbar disc surgery  07/2013    due to herniated disc, [name]  . Colonoscopy  2014    repeat 5 years; [name], baseline  . Esophagogastroduodenoscopy  2014    [name]    Social History   Social History  . Marital Status:  Married    Spouse Name: N/A  . Number of Children: N/A  . Years of Education: N/A   Occupational History  . Not on file.   Social History Main Topics  . Smoking status: Never Smoker   . Smokeless tobacco: Not on file  . Alcohol Use: 8.4 oz/week    14 Cans of beer per week     Comment: socially  . Drug Use: No  . Sexual Activity: Not on file   Other Topics Concern  . 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    Family History  Problem Relation Age of Onset  . Lupus Mother   . Hypertension Mother   . Hyperlipidemia Mother   . Migraines Mother   . Hypertension Father   . Diabetes Father   . Hyperlipidemia Father   . Obesity Sister   . Hypertension Sister   . Diabetes Sister   . Heart disease Paternal Uncle     CABG  . Hyperlipidemia Paternal Uncle   . Stroke Neg Hx   . Cancer Other     colon     Current outpatient prescriptions:  .  albuterol (PROVENTIL HFA;VENTOLIN HFA) 108 (90 BASE) MCG/ACT inhaler, Inhale 2 puffs into the lungs every 6 (six) hours as needed for wheezing or shortness of breath., Disp: 1 Inhaler, Rfl: 0 .  EPINEPHrine (EPI-PEN) 0.3 mg/0.3 mL SOAJ injection, Inject 0.3 mg into the muscle once., Disp: , Rfl:  .  esomeprazole (NEXIUM) 40 MG capsule, Take 1 capsule (40 mg total) by mouth daily before breakfast., Disp: 90 capsule, Rfl: 1 .  famotidine (PEPCID) 40 MG tablet, TAKE 1 TABLET BY MOUTH DAILY., Disp: 30 tablet, Rfl: 11 .  fluticasone (FLONASE) 50 MCG/ACT nasal spray, Place into both nostrils daily., Disp: , Rfl:  .  ipratropium (ATROVENT) 0.06 % nasal spray, Place 2 sprays into both nostrils 4 (four) times daily., Disp: 15 mL, Rfl: 1 .  levocetirizine (XYZAL) 5 MG tablet, Take 5 mg by mouth every evening., Disp: , Rfl:  .  loratadine (CLARITIN) 10 MG tablet, Take 10 mg by mouth daily.,  Disp: , Rfl:  .  meloxicam (MOBIC) 15 MG tablet, Take 1 tablet (15 mg total) by mouth daily., Disp: 90 tablet, Rfl: 3 .  montelukast (SINGULAIR) 10 MG tablet, Take 10 mg by mouth at bedtime., Disp: , Rfl:  .  sertraline (ZOLOFT) 100 MG tablet, Take 1 tablet (100 mg total) by mouth daily., Disp: 90 tablet, Rfl: 1 .  tolterodine (DETROL) 2 MG tablet, Take 2 mg by mouth at bedtime. , Disp: , Rfl:  .  Vitamin D, Ergocalciferol, (DRISDOL) 50000 UNITS CAPS capsule, Take 1 capsule (50,000 Units total) by mouth every 7 (seven) days., Disp: 12 capsule, Rfl: 3 .  ferrous fumarate-iron polysaccharide complex (TANDEM) 162-115.2 MG CAPS, Take 1 capsule by mouth daily with breakfast. (Patient not taking: Reported on 02/13/2015), Disp: 90 capsule, Rfl: 3 .  methylPREDNISolone sodium succinate (SOLU-MEDROL) 125 mg/2 mL injection, Inject 2 mLs (125 mg total) into the muscle once., Disp: 1 each, Rfl: 0 .  naltrexone (DEPADE) 50 MG tablet, Take 0.5 tablets (25 mg total) by mouth daily., Disp: 30 tablet, Rfl: 1  Allergies  Allergen Reactions  . Sulfa Antibiotics Hives       Review of Systems Constitutional: -fever, -chills, -sweats, +unexpected weight change, -decreased appetite, -fatigue Allergy: -sneezing, +itching, -congestion Dermatology: -changing moles, +rash, -lumps ENT: -runny nose, -ear pain, -sore throat, -hoarseness, -sinus pain, -teeth pain, - ringing in ears, -hearing loss, -nosebleeds Cardiology: -chest pain, -palpitations, -swelling, -difficulty breathing when lying flat, -waking up short of breath Respiratory: -cough, -shortness of breath, -difficulty breathing with exercise or exertion, -wheezing, -coughing up blood Gastroenterology: -abdominal pain, -nausea, -vomiting, -diarrhea, -constipation, -blood in stool, -changes in bowel movement, -difficulty swallowing or eating Hematology: -bleeding, -bruising  Musculoskeletal: -joint aches, -muscle aches, -joint swelling, -back pain, -neck pain,  -cramping, -changes in gait Ophthalmology: denies vision changes, eye redness, itching, discharge Urology: -burning with urination, -difficulty urinating, -blood in urine, -urinary frequency, -urgency, -incontinence Neurology: -headache, -weakness, -tingling, -numbness, -memory loss, -falls, -dizziness Psychology: -depressed mood, -agitation, -sleep problems     Objective:   Physical Exam  BP 118/90 mmHg  Pulse 80  Temp(Src) 98.2 F (36.8 C) (Oral)  Resp 18  Ht 5\' 9"  (1.753 m)  Wt 237 lb 6.4 oz (107.684 kg)  BMI 35.04 kg/m2  General appearance: alert, no distress, WD/WN, white female Skin: few scattered raised urticarial round and linear lesions of bilat lower legs c/w poison ivy dermatitis, otherwise scattered benign appearing macules, no worrisome lesions HEENT: normocephalic, conjunctiva/corneas normal, sclerae anicteric, PERRLA, EOMi, nares patent, no  discharge or erythema, pharynx normal Oral cavity: MMM, tongue normal, teeth in good repair Neck: supple, no lymphadenopathy, no thyromegaly, no masses, normal ROM, no bruits Chest: non tender, normal shape and expansion Heart: RRR, normal S1, S2, no murmurs Lungs: CTA bilaterally, no wheezes, rhonchi, or rales Abdomen: +bs, soft, few small RUQ surgical scars, non tender, non distended, no masses, no hepatomegaly, no splenomegaly, no bruits Back: non tender, lumbar surgical scar Musculoskeletal: upper extremities non tender, no obvious deformity, normal ROM throughout, lower extremities non tender, no obvious deformity, normal ROM throughout Extremities: no edema, no cyanosis, no clubbing Pulses: 2+ symmetric, upper and lower extremities, normal cap refill Neurological: alert, oriented x 3, CN2-12 intact, strength normal upper extremities and lower extremities, sensation normal throughout, DTRs 2+ throughout, no cerebellar signs, gait normal Psychiatric: normal affect, behavior normal, pleasant  Breast/gyn/rectal - deferred to  gynecology   Assessment and Plan :    Encounter Diagnoses  Name Primary?  . Routine general medical examination at a health care facility Yes  . Asthma, mild persistent, uncomplicated   . Allergic rhinitis, unspecified allergic rhinitis type   . Anemia, iron deficiency   . Gastroesophageal reflux disease without esophagitis   . OAB (overactive bladder)   . Hyperlipidemia   . PMDD (premenstrual dysphoric disorder)   . Vitamin D deficiency   . Fatty liver   . Impaired fasting blood sugar   . Poison ivy dermatitis   . Excessive drinking alcohol     Physical exam - discussed healthy lifestyle, diet, exercise, preventative care, vaccinations, and addressed their concerns.  Handout given. Solumedrol 125mg  IM in office today for poison ivy dermatitis. Obesity - work on healthy lifestyle, f/u pending labs counsled on alcohol use and advised she start to cut back significantly over the next week.  Consider counseling. See your eye doctor yearly for routine vision care. See your dentist yearly for routine dental care including hygiene visits twice yearly. See your gynecologist yearly for routine gynecological care. Follow-up pending labs.

## 2015-02-14 ENCOUNTER — Other Ambulatory Visit: Payer: Self-pay | Admitting: Medical

## 2015-02-14 LAB — LIPID PANEL
CHOL/HDL RATIO: 3.2 ratio (ref <=5.0)
Cholesterol: 221 mg/dL — ABNORMAL HIGH (ref 125–200)
HDL: 70 mg/dL (ref >=46)
LDL CALC: 128 mg/dL (ref <130)
Triglycerides: 117 mg/dL (ref <150)
VLDL: 23 mg/dL (ref <30)

## 2015-02-14 LAB — COMPREHENSIVE METABOLIC PANEL
ALT: 27 U/L (ref 6–29)
AST: 21 U/L (ref 10–30)
Albumin: 4.4 g/dL (ref 3.6–5.1)
Alkaline Phosphatase: 81 U/L (ref 33–115)
BUN: 10 mg/dL (ref 7–25)
CHLORIDE: 103 mmol/L (ref 98–110)
CO2: 23 mmol/L (ref 20–31)
Calcium: 9.3 mg/dL (ref 8.6–10.2)
Creat: 0.62 mg/dL (ref 0.50–1.10)
GLUCOSE: 88 mg/dL (ref 65–99)
POTASSIUM: 4.3 mmol/L (ref 3.5–5.3)
Sodium: 136 mmol/L (ref 135–146)
Total Bilirubin: 0.4 mg/dL (ref 0.2–1.2)
Total Protein: 7.4 g/dL (ref 6.1–8.1)

## 2015-02-14 LAB — HEMOGLOBIN A1C
HEMOGLOBIN A1C: 5.6 % (ref <5.7)
Mean Plasma Glucose: 114 mg/dL (ref <117)

## 2015-02-14 LAB — IRON AND TIBC
%SAT: 29 % (ref 11–50)
Iron: 97 ug/dL (ref 40–190)
TIBC: 339 ug/dL (ref 250–450)
UIBC: 242 ug/dL (ref 125–400)

## 2015-02-14 LAB — VITAMIN D 25 HYDROXY (VIT D DEFICIENCY, FRACTURES): Vit D, 25-Hydroxy: 32 ng/mL (ref 30–100)

## 2015-02-14 MED ORDER — MELOXICAM 15 MG PO TABS: 15.0000 mg | ORAL_TABLET | Freq: Every day | ORAL | Status: DC

## 2015-02-18 MED ORDER — METHYLPREDNISOLONE SODIUM SUCC 125 MG IJ SOLR: 125.0000 mg | Freq: Once | INTRAMUSCULAR | Status: DC

## 2015-02-18 MED ORDER — SODIUM CHLORIDE 0.9 % IV SOLN: 125.0000 mg | Freq: Once | INTRAVENOUS | Status: DC

## 2015-02-18 MED ORDER — METHYLPREDNISOLONE SODIUM SUCC 125 MG IJ SOLR
125.0000 mg | Freq: Once | INTRAMUSCULAR | Status: AC
  Administered 2015-02-13: 125 mg via INTRAMUSCULAR

## 2015-02-18 NOTE — Addendum Note (Signed)
Addended by: Patience Musca F on: 02/18/2015 09:45 AM   Modules accepted: Orders, Medications

## 2015-02-18 NOTE — Addendum Note (Signed)
Addended by: Patience Musca F on: 02/18/2015 09:03 AM   Modules accepted: Orders

## 2015-02-19 ENCOUNTER — Other Ambulatory Visit: Payer: Self-pay | Admitting: Medical

## 2015-02-19 ENCOUNTER — Telehealth: Payer: Self-pay | Admitting: Medical

## 2015-02-19 MED ORDER — METHYLPREDNISOLONE 4 MG PO TABS: 4.0000 mg | ORAL_TABLET | Freq: Every day | ORAL | Status: DC

## 2015-02-19 MED ORDER — TRIAMCINOLONE ACETONIDE 0.1 % EX CREA: 1.0000 "application " | TOPICAL_CREAM | Freq: Two times a day (BID) | CUTANEOUS | Status: DC

## 2015-02-19 NOTE — Telephone Encounter (Signed)
I sent topical cream and oral medrol to pharmacy

## 2015-02-19 NOTE — Telephone Encounter (Signed)
Called pt reached voice mail and left message of same

## 2015-02-19 NOTE — Telephone Encounter (Signed)
Please call patient states cream for poison oak not helping. Possibly spreading  Pt will be in meeting 1-5   If Rx sent in TODAY send to [facility]

## 2015-03-20 ENCOUNTER — Ambulatory Visit (INDEPENDENT_AMBULATORY_CARE_PROVIDER_SITE_OTHER): Payer: 59 | Admitting: Medical

## 2015-03-20 ENCOUNTER — Ambulatory Visit: Payer: 59 | Admitting: Family Medicine

## 2015-03-20 ENCOUNTER — Other Ambulatory Visit: Payer: Self-pay | Admitting: Medical

## 2015-03-20 ENCOUNTER — Encounter: Payer: Self-pay | Admitting: Medical

## 2015-03-20 VITALS — BP 120/68 | HR 92 | Temp 98.0°F

## 2015-03-20 DIAGNOSIS — L989 Disorder of the skin and subcutaneous tissue, unspecified: Secondary | ICD-10-CM

## 2015-03-20 DIAGNOSIS — L819 Disorder of pigmentation, unspecified: Secondary | ICD-10-CM | POA: Diagnosis not present

## 2015-03-20 DIAGNOSIS — L570 Actinic keratosis: Secondary | ICD-10-CM

## 2015-03-20 DIAGNOSIS — L814 Other melanin hyperpigmentation: Secondary | ICD-10-CM

## 2015-03-20 NOTE — Progress Notes (Signed)
Subjective: Chief Complaint  Patient presents with  . Mole removal    4 on lt arm. one on each leg. no other concerns   Here for skin procedures, skin lesions of concern, changing lesions, crusting lesions - bilat UE and LE   Objective: BP 120/68 mmHg  Pulse 92  Temp(Src) 98 F (36.7 C)  LMP 03/16/2015  Gen: wd, wn, nad  Skin:  #1 - left upper arm 8mm diameter pink some inflamed raised lesion with faint central umbilication #2 - left forearm distal to #2 with 27mm x 80mm rectangular brown and pink slightly crusted lesion solar lentigo vs AK #3 - left anterior forearm distal to #3 lesion with round 65mm diameter slightly raised pink  brown lesion solar lentigo vs AK #4 - right anterior forearm with raised 54mm diameter crusted lesion  #5 - left leg just proximal to knee medially with 37mm x 63mm elongated brown lesion with raised crusted central horn projection #6,7 - right lateral lower leg just distal to knee with 2 similar lesions about 4cm apart from each other.   Both lesions are flat yellow brown but somewhat crusted 3-23mm diameter each   Assessment: Encounter Diagnoses  Name Primary?  . Solar lentigo Yes  . Actinic keratoses   . Changing skin lesion      Plan: discussed lesions of concern.  The methods of destruction with cryotherapy and shave biopsy as below   For lesions #2, 3, 4, 6, 7 - used Verruca freeze for cryotherapy to those lesions.  Patient tolerated procedure well.  Covered these with band aid and bacitracin ointment.    For lesion #1, cleaned and prepped lesion in normal sterile fashion.  Used 1% lidocaine with epi for local anesthesia, used sterile razor for shave biopsy.   Sent lesion to pathology.   Used silver nitrate for hemostasis.   Covered with sterile bandage  For lesion #5,  cleaned and prepped lesion in normal sterile fashion.  Used 1% lidocaine with epi for local anesthesia, used sterile razor for shave biopsy.   Sent lesion to pathology.   Used  silver nitrate for hemostasis.   Covered with sterile bandage  Discussed wound care, will call with pathologic results.   Call or return if signs of infection or other concerns.

## 2015-03-20 NOTE — Progress Notes (Deleted)
   Subjective:    Patient ID: Michelle Griffin, female    DOB: 02/01/1971, 44 y.o.   MRN: [id number]  HPI    Review of Systems     Objective:   Physical Exam  Skin:       Gen: wd, wn, nad Skin: #1 - left upper arm 65mm diameter pink some inlfamed raised lesoin with faint central umbiliciation #2 - left anterior forearm with triangular 85mm x 59mm brown lesion solar lentigo vs AK #3 - left forearm distal to #2 with 101mm x 58mm rectangular brown and pink slightly crusted lesion solar lentigo vs AK #4 - left anterior forearm distal to #3 lesion with triangular 41mm x 94mm brown lesion solar lentigo vs AK #5 - right anterior forearm with raised 59mm crusted lesion         Assessment & Plan:

## 2015-04-16 ENCOUNTER — Other Ambulatory Visit: Payer: Self-pay | Admitting: Oncology

## 2015-04-20 ENCOUNTER — Other Ambulatory Visit: Payer: Self-pay | Admitting: Oncology

## 2015-05-02 ENCOUNTER — Ambulatory Visit (INDEPENDENT_AMBULATORY_CARE_PROVIDER_SITE_OTHER): Payer: 59 | Admitting: Medical

## 2015-05-02 ENCOUNTER — Encounter: Payer: Self-pay | Admitting: Medical

## 2015-05-02 VITALS — BP 120/90 | HR 90 | Wt 240.0 lb

## 2015-05-02 DIAGNOSIS — M5442 Lumbago with sciatica, left side: Secondary | ICD-10-CM

## 2015-05-02 DIAGNOSIS — W19XXXA Unspecified fall, initial encounter: Secondary | ICD-10-CM

## 2015-05-02 DIAGNOSIS — S80211A Abrasion, right knee, initial encounter: Secondary | ICD-10-CM

## 2015-05-02 DIAGNOSIS — S5001XA Contusion of right elbow, initial encounter: Secondary | ICD-10-CM | POA: Diagnosis not present

## 2015-05-02 DIAGNOSIS — M79605 Pain in left leg: Secondary | ICD-10-CM

## 2015-05-02 DIAGNOSIS — S40211A Abrasion of right shoulder, initial encounter: Secondary | ICD-10-CM

## 2015-05-02 MED ORDER — METAXALONE 800 MG PO TABS: ORAL_TABLET | ORAL | Status: DC

## 2015-05-02 MED ORDER — TRAMADOL HCL 50 MG PO TABS: 50.0000 mg | ORAL_TABLET | Freq: Four times a day (QID) | ORAL | Status: DC | PRN

## 2015-05-02 MED ORDER — PREDNISONE 10 MG PO TABS: ORAL_TABLET | ORAL | Status: DC

## 2015-05-02 NOTE — Progress Notes (Signed)
Subjective: Chief Complaint  Patient presents with  . Back Pain    and knee pain. states she can not sit down. fell outside of work on tuesday. thinks she may have herniated a disc. been on heating pad ibuprofen. tried vicodan and thats not working. asked for a tramadol shot. going through both butt cheeks down to her knees on the same side she herniated before. had back surgery almost 2 years ago   Here for fall, back pain, abrasions.  She was outside on 04/29/15 walking on pavement while she and a friend were looking for a Mink that someone spotted outside.   She was walking on broken pavement in her work dress shoes, stumbled on the broken pavement and fell on her right side.   At the time she landed on her side and knee, ended up with abrasion of right knee and shoulder and elbow.  She broke her glasses in the process.  She denies head injury or LOC.  Other than a little bruised and achy didn't fell all that bad until the next morning awoke with lots of pain in low back, radiating down into left leg and buttock, down to knee.    This pain continued that day, the next day, and still no better as of now.  She has back surgery in 2015, and is worried she ruptured a disc.  She denies numbness, tingling, weakness, saddle anesthesia, or incontinence.  No fever.   Used some old Valium and Vicodin she had left over from prior surgery.  Still in a lot of pain without relief.   Has taken nothing this morning since she called to be worked in.   Husband had to help her get her pants on this morning.   Pain is constant.  Motion in general, certainly flexion and extension, stairs and walking all aggravate the pain.  No other aggravating or relieving factors. No other complaint.  ROS as in subjective   Objective: BP 120/90 mmHg  Pulse 90  Wt 240 lb (108.863 kg)  General appearance: alert, in pain, grimacing at times, uncomfortable appearing HEENT: unremarkable, no facial bruising or tenderness Oral cavity:  MMM, no lesions Neck: supple, no lymphadenopathy, no thyromegaly, no masses, normal ROM Abdomen: soft, non tender, non distended, no masses, no hepatomegaly, no splenomegaly Back: +bilateral lumbar tenderness, ROM limited due to pain, lumbar surgical scar Musculoskeletal: tender left buttock otherwise non tender, no swelling, no obvious deformity, +pain with left leg with SLR.  Mild tenderness over right shoulder and knee in general Skin: mild abrasion of right anterior shoulder, anterior knee, and posterior forearm just distal to elbow Extremities: no edema, no cyanosis, no clubbing Pulses: 2+ symmetric, upper and lower extremities, normal cap refill Neurological:  CN2-12 intact, A&O x 3, unable to fully assess strength and sensation of legs due to pain.   She did ambulate slowly today albeit with pain   Assessment: Encounter Diagnoses  Name Primary?  . Bilateral low back pain with left-sided sciatica Yes  . Fall, initial encounter   . Leg pain, left   . Shoulder abrasion, right, initial encounter   . Knee abrasion, right, initial encounter   . Elbow contusion, right, initial encounter     Plan: Begin short term Ultram for pain, Skelaxin 1/2-1 tablet q6 hours prn spasm, prednisone does pack.   Advised rest, avoid reinjury, and recheck in 1 week.  Discussed wound care regarding abrasions.  Recheck sooner if worse or not improving . discussed signs of  cauda equina or other serious symptoms that would prompt immediate recheck.    Keyanni was seen today for back pain.  Diagnoses and all orders for this visit:  Bilateral low back pain with left-sided sciatica  Fall, initial encounter  Leg pain, left  Shoulder abrasion, right, initial encounter  Knee abrasion, right, initial encounter  Elbow contusion, right, initial encounter  Other orders -     traMADol (ULTRAM) 50 MG tablet; Take 1 tablet (50 mg total) by mouth every 6 (six) hours as needed. -     predniSONE (DELTASONE) 10 MG  tablet; 6/5/4/3/2/1 taper -     metaxalone (SKELAXIN) 800 MG tablet; 1/2-1 tablet as needed q8 hours prn

## 2015-06-04 MED FILL — SERTRALINE HCL 100 MG TAB: 100 | 30 days supply | Qty: 30 | Fill #2 | Status: TO

## 2015-06-07 DIAGNOSIS — R87613 High grade squamous intraepithelial lesion on cytologic smear of cervix (HGSIL): Secondary | ICD-10-CM | POA: Diagnosis not present

## 2015-06-07 DIAGNOSIS — N920 Excessive and frequent menstruation with regular cycle: Secondary | ICD-10-CM | POA: Diagnosis not present

## 2015-06-13 MED FILL — FAMOTIDINE 40 MG TABLET: 40 | 30 days supply | Qty: 30 | Fill #2 | Status: TO

## 2015-06-19 DIAGNOSIS — H5201 Hypermetropia, right eye: Secondary | ICD-10-CM | POA: Diagnosis not present

## 2015-06-19 DIAGNOSIS — H52223 Regular astigmatism, bilateral: Secondary | ICD-10-CM | POA: Diagnosis not present

## 2015-06-19 DIAGNOSIS — H524 Presbyopia: Secondary | ICD-10-CM | POA: Diagnosis not present

## 2015-07-03 MED FILL — SERTRALINE HCL 100 MG TAB: 100 | 30 days supply | Qty: 30 | Fill #0

## 2015-07-04 ENCOUNTER — Encounter: Payer: 59 | Attending: Medical | Admitting: Dietician

## 2015-07-04 DIAGNOSIS — Z713 Dietary counseling and surveillance: Secondary | ICD-10-CM | POA: Insufficient documentation

## 2015-07-04 NOTE — Progress Notes (Signed)
Notes from [facility] employee "self referral" nutrition session: Start time: [place]  Met with employee to discuss his/her nutritional concerns and diet history. The employee's questions/concerns were also addressed.  We discussed the following topics:  Healthy Eating  Exercise  Decreasing risk of diabetes  Weight Concerns  I also provided the following handouts as reinforcement of the educational session:  Food Guide Plate  Additional Comments: Patient has made positive changes in eating habits such as decreasing soda and fried foods and not eating after 7:00pm Discussed balance at meals of carbohydrate, protein and non-starchy vegetables. Use food guide plate as visual. Also discussed strategies verses will power such as keeping fruit and vegetables at work to add to sandwiches verses chips. Discussed examples of small meals that don't require a lot of preparation.  Goals: 1. To increase intake of fruits and vegetables with goal of 5 servings per day.  2.To include 2-4 servings of carbohydrate at meals spreading 12-13 servings over 3 meals and 1-2 snacks.  3. To balance meals with carbohydrate  (in amounts indicated above) , 1-3 oz. protein and non-starchy vegetables.

## 2015-07-04 NOTE — Patient Instructions (Signed)
Goals: 1. To increase intake of fruits and vegetables with goal of 5 servings per day.  2.To include 2-4 servings of carbohydrate at meals spreading 12-13 servings over 3 meals and 1-2 snacks.  3. To balance meals with carbohydrate  (in amounts indicated above) , 1-3 oz. protein and non-starchy vegetables.

## 2015-07-08 MED FILL — FAMOTIDINE 40 MG TABLET: 40 | 30 days supply | Qty: 30 | Fill #0

## 2015-07-12 MED FILL — AZELASTINE HCL 137 MCG SPRY: 0.1 | 30 days supply | Qty: 30 | Fill #3

## 2015-07-12 MED FILL — FLUTICASONE PROP 50 MCG SPR: 50 | 60 days supply | Qty: 16 | Fill #0

## 2015-07-12 MED FILL — TOLTERODINE TARTRATE 2 MG T: 2 | 30 days supply | Qty: 60 | Fill #0

## 2015-07-12 MED FILL — NALTREXONE 50 MG TABLET: 50 | 60 days supply | Qty: 30 | Fill #1

## 2015-07-19 MED FILL — miSOPROStol 200 MCG TABS: 200 | 1 days supply | Qty: 2 | Fill #0

## 2015-07-23 ENCOUNTER — Telehealth: Payer: Self-pay

## 2015-07-23 MED FILL — MONTELUKAST SOD 10 MG TAB: 10 | 30 days supply | Qty: 30 | Fill #0

## 2015-07-23 NOTE — Telephone Encounter (Signed)
Sent an e-mail to pt.

## 2015-07-24 DIAGNOSIS — R87613 High grade squamous intraepithelial lesion on cytologic smear of cervix (HGSIL): Secondary | ICD-10-CM | POA: Diagnosis not present

## 2015-07-24 DIAGNOSIS — Z3202 Encounter for pregnancy test, result negative: Secondary | ICD-10-CM | POA: Diagnosis not present

## 2015-07-24 DIAGNOSIS — N920 Excessive and frequent menstruation with regular cycle: Secondary | ICD-10-CM | POA: Diagnosis not present

## 2015-07-24 DIAGNOSIS — N870 Mild cervical dysplasia: Secondary | ICD-10-CM | POA: Diagnosis not present

## 2015-07-24 DIAGNOSIS — N871 Moderate cervical dysplasia: Secondary | ICD-10-CM | POA: Diagnosis not present

## 2015-07-24 MED FILL — HYDROCODON-APAP 5-325: 5-325 | 1 days supply | Qty: 10 | Fill #0

## 2015-07-26 DIAGNOSIS — R509 Fever, unspecified: Secondary | ICD-10-CM | POA: Diagnosis not present

## 2015-07-26 MED FILL — CIPROFLOXACIN HCL 500 MG TA: 500 | 7 days supply | Qty: 14 | Fill #0

## 2015-07-26 MED FILL — FLUCONAZOLE 150 MG TABLET: 150 | 2 days supply | Qty: 2 | Fill #0

## 2015-07-26 MED FILL — metroNIDAZOLE 500 MG TABS: 500 | 7 days supply | Qty: 14 | Fill #0

## 2015-07-26 MED FILL — OSELTAMIVIR PHOS 75 MG CAP: 75 | 5 days supply | Qty: 10 | Fill #0

## 2015-07-27 ENCOUNTER — Encounter (HOSPITAL_COMMUNITY): Payer: Self-pay | Admitting: *Deleted

## 2015-07-27 ENCOUNTER — Inpatient Hospital Stay (HOSPITAL_COMMUNITY): Payer: 59

## 2015-07-27 ENCOUNTER — Inpatient Hospital Stay (HOSPITAL_COMMUNITY)
Admission: EM | Admit: 2015-07-27 | Discharge: 2015-07-30 | DRG: 864 | Disposition: A | Discharge status: Home / Self Care | Payer: 59 | Source: Ambulatory Visit | Attending: Obstetrics and Gynecology | Admitting: Obstetrics and Gynecology

## 2015-07-27 DIAGNOSIS — N3281 Overactive bladder: Secondary | ICD-10-CM | POA: Diagnosis not present

## 2015-07-27 DIAGNOSIS — N719 Inflammatory disease of uterus, unspecified: Secondary | ICD-10-CM | POA: Diagnosis not present

## 2015-07-27 DIAGNOSIS — E785 Hyperlipidemia, unspecified: Secondary | ICD-10-CM | POA: Diagnosis present

## 2015-07-27 DIAGNOSIS — D259 Leiomyoma of uterus, unspecified: Secondary | ICD-10-CM | POA: Diagnosis present

## 2015-07-27 DIAGNOSIS — D72821 Monocytosis (symptomatic): Secondary | ICD-10-CM | POA: Diagnosis not present

## 2015-07-27 DIAGNOSIS — Z9889 Other specified postprocedural states: Secondary | ICD-10-CM | POA: Diagnosis not present

## 2015-07-27 DIAGNOSIS — F419 Anxiety disorder, unspecified: Secondary | ICD-10-CM | POA: Diagnosis present

## 2015-07-27 DIAGNOSIS — J45909 Unspecified asthma, uncomplicated: Secondary | ICD-10-CM | POA: Diagnosis not present

## 2015-07-27 DIAGNOSIS — K219 Gastro-esophageal reflux disease without esophagitis: Secondary | ICD-10-CM | POA: Diagnosis present

## 2015-07-27 DIAGNOSIS — N879 Dysplasia of cervix uteri, unspecified: Secondary | ICD-10-CM | POA: Diagnosis present

## 2015-07-27 DIAGNOSIS — R509 Fever, unspecified: Secondary | ICD-10-CM | POA: Diagnosis not present

## 2015-07-27 DIAGNOSIS — R5082 Postprocedural fever: Principal | ICD-10-CM | POA: Diagnosis present

## 2015-07-27 DIAGNOSIS — R740 Nonspecific elevation of levels of transaminase and lactic acid dehydrogenase [LDH]: Secondary | ICD-10-CM | POA: Diagnosis not present

## 2015-07-27 DIAGNOSIS — N920 Excessive and frequent menstruation with regular cycle: Secondary | ICD-10-CM | POA: Diagnosis present

## 2015-07-27 DIAGNOSIS — Z87410 Personal history of cervical dysplasia: Secondary | ICD-10-CM

## 2015-07-27 DIAGNOSIS — R8299 Other abnormal findings in urine: Secondary | ICD-10-CM | POA: Diagnosis not present

## 2015-07-27 LAB — URINALYSIS, ROUTINE W REFLEX MICROSCOPIC
Glucose, UA: NEGATIVE mg/dL
Ketones, ur: 15 mg/dL — AB
NITRITE: NEGATIVE
PH: 6.5 (ref 5.0–8.0)
Protein, ur: 30 mg/dL — AB
SPECIFIC GRAVITY, URINE: 1.01 (ref 1.005–1.030)

## 2015-07-27 LAB — CBC
HEMATOCRIT: 42 % (ref 36.0–46.0)
Hemoglobin: 14.4 g/dL (ref 12.0–15.0)
MCH: 29.1 pg (ref 26.0–34.0)
MCHC: 34.3 g/dL (ref 30.0–36.0)
MCV: 85 fL (ref 78.0–100.0)
Platelets: 180 10*3/uL (ref 150–400)
RBC: 4.94 MIL/uL (ref 3.87–5.11)
RDW: 13.9 % (ref 11.5–15.5)
WBC: 6.9 10*3/uL (ref 4.0–10.5)

## 2015-07-27 LAB — COMPREHENSIVE METABOLIC PANEL
ALBUMIN: 4 g/dL (ref 3.5–5.0)
ALT: 57 U/L — AB (ref 14–54)
AST: 65 U/L — AB (ref 15–41)
Alkaline Phosphatase: 111 U/L (ref 38–126)
Anion gap: 8 (ref 5–15)
BILIRUBIN TOTAL: 0.8 mg/dL (ref 0.3–1.2)
BUN: 6 mg/dL (ref 6–20)
CHLORIDE: 101 mmol/L (ref 101–111)
CO2: 25 mmol/L (ref 22–32)
CREATININE: 0.95 mg/dL (ref 0.44–1.00)
Calcium: 9 mg/dL (ref 8.9–10.3)
GFR calc Af Amer: >60 mL/min (ref >60)
GLUCOSE: 146 mg/dL — AB (ref 65–99)
Potassium: 3.6 mmol/L (ref 3.5–5.1)
Sodium: 134 mmol/L — ABNORMAL LOW (ref 135–145)
TOTAL PROTEIN: 7.5 g/dL (ref 6.5–8.1)

## 2015-07-27 LAB — URINE MICROSCOPIC-ADD ON

## 2015-07-27 LAB — INFLUENZA PANEL BY PCR (TYPE A & B)
H1N1 flu by pcr: NOT DETECTED
INFLAPCR: NEGATIVE
Influenza B By PCR: NEGATIVE

## 2015-07-27 MED ORDER — ACETAMINOPHEN 325 MG PO TABS
650.0000 mg | ORAL_TABLET | ORAL | Status: DC | PRN
  Administered 2015-07-27 – 2015-07-28 (×3): 650 mg via ORAL
  Filled 2015-07-27 (×3): qty 2

## 2015-07-27 MED ORDER — IOHEXOL 300 MG/ML  SOLN
50.0000 mL | INTRAMUSCULAR | Status: AC
  Administered 2015-07-27 (×2): 50 mL via ORAL

## 2015-07-27 MED ORDER — IBUPROFEN 600 MG PO TABS
600.0000 mg | ORAL_TABLET | Freq: Four times a day (QID) | ORAL | Status: DC | PRN
  Administered 2015-07-27 – 2015-07-29 (×4): 600 mg via ORAL
  Filled 2015-07-27 (×4): qty 1

## 2015-07-27 MED ORDER — LACTATED RINGERS IV SOLN
INTRAVENOUS | Status: DC
  Administered 2015-07-27 – 2015-07-28 (×3): via INTRAVENOUS

## 2015-07-27 MED ORDER — MONTELUKAST SODIUM 10 MG PO TABS
10.0000 mg | ORAL_TABLET | Freq: Every day | ORAL | Status: DC
  Administered 2015-07-27 – 2015-07-29 (×3): 10 mg via ORAL
  Filled 2015-07-27 (×4): qty 1

## 2015-07-27 MED ORDER — LORATADINE 10 MG PO TABS
10.0000 mg | ORAL_TABLET | Freq: Every day | ORAL | Status: DC
  Administered 2015-07-28 – 2015-07-30 (×3): 10 mg via ORAL
  Filled 2015-07-27 (×5): qty 1

## 2015-07-27 MED ORDER — FAMOTIDINE 20 MG PO TABS
40.0000 mg | ORAL_TABLET | Freq: Every day | ORAL | Status: DC
  Administered 2015-07-28 – 2015-07-30 (×3): 40 mg via ORAL
  Filled 2015-07-27 (×3): qty 2

## 2015-07-27 MED ORDER — FLUTICASONE PROPIONATE 50 MCG/ACT NA SUSP
2.0000 | Freq: Every day | NASAL | Status: DC
  Administered 2015-07-27 – 2015-07-29 (×3): 2 via NASAL
  Filled 2015-07-27: qty 16

## 2015-07-27 MED ORDER — DOCUSATE SODIUM 100 MG PO CAPS
100.0000 mg | ORAL_CAPSULE | Freq: Two times a day (BID) | ORAL | Status: DC
  Administered 2015-07-27 – 2015-07-30 (×6): 100 mg via ORAL
  Filled 2015-07-27 (×6): qty 1

## 2015-07-27 MED ORDER — ONDANSETRON HCL 4 MG/2ML IJ SOLN
4.0000 mg | Freq: Four times a day (QID) | INTRAMUSCULAR | Status: DC | PRN
Start: 2015-07-27 — End: 2015-07-30
  Administered 2015-07-27 – 2015-07-28 (×2): 4 mg via INTRAVENOUS
  Filled 2015-07-27 (×2): qty 2

## 2015-07-27 MED ORDER — IOHEXOL 300 MG/ML  SOLN
100.0000 mL | Freq: Once | INTRAMUSCULAR | Status: AC | PRN
  Administered 2015-07-27: 100 mL via INTRAVENOUS

## 2015-07-27 MED ORDER — TOLTERODINE TARTRATE 2 MG PO TABS
2.0000 mg | ORAL_TABLET | Freq: Two times a day (BID) | ORAL | Status: DC
  Administered 2015-07-27: 2 mg via ORAL

## 2015-07-27 MED ORDER — ONDANSETRON HCL 4 MG PO TABS
4.0000 mg | ORAL_TABLET | Freq: Four times a day (QID) | ORAL | Status: DC | PRN
  Administered 2015-07-29: 4 mg via ORAL
  Filled 2015-07-27: qty 1

## 2015-07-27 MED ORDER — SERTRALINE HCL 100 MG PO TABS
100.0000 mg | ORAL_TABLET | Freq: Every day | ORAL | Status: DC
  Administered 2015-07-28 – 2015-07-30 (×3): 100 mg via ORAL
  Filled 2015-07-27 (×5): qty 1

## 2015-07-27 MED ORDER — LACTATED RINGERS IV SOLN
INTRAVENOUS | Status: DC
  Administered 2015-07-27: 07:00:00 via INTRAVENOUS

## 2015-07-27 MED ORDER — DEXTROSE 5 % IV SOLN
500.0000 mg | INTRAVENOUS | Status: DC
  Administered 2015-07-27 – 2015-07-29 (×3): 500 mg via INTRAVENOUS
  Filled 2015-07-27 (×4): qty 500

## 2015-07-27 MED ORDER — PIPERACILLIN-TAZOBACTAM 3.375 G IVPB
3.3750 g | Freq: Three times a day (TID) | INTRAVENOUS | Status: DC
  Administered 2015-07-27 – 2015-07-30 (×10): 3.375 g via INTRAVENOUS
  Filled 2015-07-27 (×12): qty 50

## 2015-07-27 MED ORDER — LEVOCETIRIZINE DIHYDROCHLORIDE 5 MG PO TABS
5.0000 mg | ORAL_TABLET | Freq: Every evening | ORAL | Status: DC
  Administered 2015-07-27: 5 mg via ORAL

## 2015-07-27 NOTE — MAU Note (Signed)
Had LEEP and novasure Thurs. About 1800 THurs started having fever, hurt all over, joint pain. Vomited this am once and had abd cramping since throwing up. No diarrhea.

## 2015-07-27 NOTE — Progress Notes (Signed)
Report to [facility] for patient admission.  Pt to be admitted to room 307 after going to radiology.

## 2015-07-27 NOTE — MAU Provider Note (Signed)
H&P Pt comes in for fever following a Novasure ablation and LEEP  in-office 07/23/14.  Pt had procedure and felt fine following it and until the evening of 07/24/14 whenin the late PM she started experiencing body aches and low grade temperature.  She was seen in the office 07/26/15 and noted to have a temperature of 99.4 and abdominal exam was relatively benign.  She did have some flank pain.  CBC was normal with WBC 10K.  She went home with cipro and flagyl and temperature broke with motrin and she began to feel better. Temperature then returned late last PM to 101+ and she had an episode of N/V that awoke her.  I advised her to come in for further evaluation.  Last took Motrin 600mg  at 3AM  Past Medical HX Asthma Migraines Allergies  Past Surgical Hx Novasure/LEEP 07/24/15 Back Surgery Cholecystectomy  PastGynHx Menorrhagia HGSIL pap  Past OB Hx NSVD x 2  Allergy Sulfa  Temp 100.7  125/64 110 CV regular rhythm, mildly tachycardic Lungs CTA  Abdomen soft ND, NT Pelvic Uterine tenderness noted and suprapubic tenderness            Scant brown d/c               Pt with onset of fever s/p Novasure procedure 3 days ago.  One episode of N/V this AM, but no abdominal pain otherwise. Has had some mild cramping following the vomiting, but nothing significant.  Cipro and flagyl given in office yesterday, pt has taken 2 doses.    Will check flu swab just to r/o Will treat as endometritis following Novasure with IV zosyn after d/w pharmacy Check CBC/CMET--was WNL yesterday Plan a CT scan with contrast to r/o any occult injury.  Pt does not have an acute abdomen. Admit to observation.

## 2015-07-27 NOTE — Progress Notes (Addendum)
Patient ID: Michelle Griffin, female   DOB: 1971-03-20, 45 y.o.   MRN: [place]:[id number]  Admitted with fever after Novasure CT WNL, IV hydration, Flu neg Ur Cx pending at [facility]  F = 101.6 VSS gen NAD, uncomf CV RRR Lungs CTAB Abd soft, NT, ND, +BS Ext sym, NT  Will get blood cultures CBC and CMP in AM Continue Zosyn

## 2015-07-27 NOTE — Progress Notes (Signed)
Patient ID: Michelle Griffin, female   DOB: [birth year], 45 y.o.   MRN: [place]:[id number] Pt CT Scan unremarkable.  No evidence of uterine perforation or bowel injury.  Uterus with a low level attenuation adjacent to the cavity, but has a known fibroid there.  WBC normal at 6K.  LFT's slightly elevated.  D/w pt and husband this clinically just does not seem like a pelvic infection with lack of white count and lack of pelvic pain.  Will continue zosyn while awaiting flu test results.  Allow pt to eat.

## 2015-07-27 NOTE — Progress Notes (Signed)
Bimanual only  

## 2015-07-28 LAB — BASIC METABOLIC PANEL
Anion gap: 6 (ref 5–15)
BUN: 5 mg/dL — AB (ref 6–20)
CALCIUM: 8.6 mg/dL — AB (ref 8.9–10.3)
CO2: 28 mmol/L (ref 22–32)
Chloride: 103 mmol/L (ref 101–111)
Creatinine, Ser: 0.78 mg/dL (ref 0.44–1.00)
GFR calc Af Amer: >60 mL/min (ref >60)
GLUCOSE: 112 mg/dL — AB (ref 65–99)
Potassium: 3.9 mmol/L (ref 3.5–5.1)
Sodium: 137 mmol/L (ref 135–145)

## 2015-07-28 LAB — CBC
HEMATOCRIT: 38.4 % (ref 36.0–46.0)
Hemoglobin: 12.8 g/dL (ref 12.0–15.0)
MCH: 28.6 pg (ref 26.0–34.0)
MCHC: 33.3 g/dL (ref 30.0–36.0)
MCV: 85.7 fL (ref 78.0–100.0)
Platelets: 138 10*3/uL — ABNORMAL LOW (ref 150–400)
RBC: 4.48 MIL/uL (ref 3.87–5.11)
RDW: 14.1 % (ref 11.5–15.5)
WBC: 4.7 10*3/uL (ref 4.0–10.5)

## 2015-07-28 MED ORDER — LEVOCETIRIZINE DIHYDROCHLORIDE 5 MG PO TABS
5.0000 mg | ORAL_TABLET | Freq: Every day | ORAL | Status: DC
  Administered 2015-07-28 – 2015-07-29 (×2): 5 mg via ORAL
  Filled 2015-07-28: qty 1

## 2015-07-28 MED ORDER — TOLTERODINE TARTRATE 2 MG PO TABS
2.0000 mg | ORAL_TABLET | Freq: Every day | ORAL | Status: DC
  Administered 2015-07-28 – 2015-07-29 (×2): 2 mg via ORAL
  Filled 2015-07-28: qty 1

## 2015-07-28 NOTE — Progress Notes (Signed)
Patient ID: Michelle Griffin, female   DOB: 10/01/1970, 45 y.o.   MRN: [place]:[id number] Pt improved today but just very fatigued.  Appetite increasing No pain, no nausea  Tmax 99.5 in last 24 hours  Abdomen soft NT  Pt with resolving fever of unknown etiology. Unclear if was uterine infection or viral syndrome.  Will test for mono, flu negative Continue zosyn this PM and if no further temp spikes change to oral Urine cx in office was GBS only Blood cx prelim neg

## 2015-07-28 NOTE — Progress Notes (Signed)
AM labs drawn.

## 2015-07-28 NOTE — Progress Notes (Signed)
Fever after Novasure/LEEP  Subjective: Patient reports tolerating PO and no problems voiding  Some cramping, but feeling better.    Objective: I have reviewed patient's vital signs, intake and output, medications and microbiology.  General: alert and no distress Resp: clear to auscultation bilaterally Cardio: regular rate and rhythm GI: soft, non-tender; bowel sounds normal; no masses,  no organomegaly Extremities: extremities normal, atraumatic, no cyanosis or edema   Assessment/Plan: 45yo s/p Novasure/LEEP 2/23 in office with fever (neg CT, neg flu) D/C with Augmentin to complete 7-10day course and Zmax Likely d/c in AM Ur Cx P, Bld cx P, Path from LEEP P   LOS: 1 day    Bovard-Stuckert, Rakia Frayne 07/28/2015, 9:33 AM

## 2015-07-29 ENCOUNTER — Other Ambulatory Visit: Payer: Self-pay

## 2015-07-29 DIAGNOSIS — Z9889 Other specified postprocedural states: Secondary | ICD-10-CM | POA: Diagnosis not present

## 2015-07-29 DIAGNOSIS — D259 Leiomyoma of uterus, unspecified: Secondary | ICD-10-CM | POA: Diagnosis present

## 2015-07-29 DIAGNOSIS — E785 Hyperlipidemia, unspecified: Secondary | ICD-10-CM | POA: Diagnosis present

## 2015-07-29 DIAGNOSIS — D72821 Monocytosis (symptomatic): Secondary | ICD-10-CM

## 2015-07-29 DIAGNOSIS — J3089 Other allergic rhinitis: Secondary | ICD-10-CM | POA: Diagnosis not present

## 2015-07-29 DIAGNOSIS — R8299 Other abnormal findings in urine: Secondary | ICD-10-CM | POA: Diagnosis not present

## 2015-07-29 DIAGNOSIS — N719 Inflammatory disease of uterus, unspecified: Secondary | ICD-10-CM | POA: Diagnosis present

## 2015-07-29 DIAGNOSIS — J3081 Allergic rhinitis due to animal (cat) (dog) hair and dander: Secondary | ICD-10-CM | POA: Diagnosis not present

## 2015-07-29 DIAGNOSIS — J301 Allergic rhinitis due to pollen: Secondary | ICD-10-CM | POA: Diagnosis not present

## 2015-07-29 DIAGNOSIS — R740 Nonspecific elevation of levels of transaminase and lactic acid dehydrogenase [LDH]: Secondary | ICD-10-CM | POA: Diagnosis not present

## 2015-07-29 DIAGNOSIS — J45909 Unspecified asthma, uncomplicated: Secondary | ICD-10-CM | POA: Diagnosis present

## 2015-07-29 DIAGNOSIS — N879 Dysplasia of cervix uteri, unspecified: Secondary | ICD-10-CM | POA: Diagnosis present

## 2015-07-29 DIAGNOSIS — N920 Excessive and frequent menstruation with regular cycle: Secondary | ICD-10-CM | POA: Diagnosis present

## 2015-07-29 DIAGNOSIS — F419 Anxiety disorder, unspecified: Secondary | ICD-10-CM | POA: Diagnosis present

## 2015-07-29 DIAGNOSIS — R509 Fever, unspecified: Secondary | ICD-10-CM

## 2015-07-29 DIAGNOSIS — N3281 Overactive bladder: Secondary | ICD-10-CM | POA: Diagnosis present

## 2015-07-29 DIAGNOSIS — K219 Gastro-esophageal reflux disease without esophagitis: Secondary | ICD-10-CM | POA: Diagnosis present

## 2015-07-29 DIAGNOSIS — R5082 Postprocedural fever: Secondary | ICD-10-CM | POA: Diagnosis not present

## 2015-07-29 DIAGNOSIS — Z87410 Personal history of cervical dysplasia: Secondary | ICD-10-CM | POA: Diagnosis not present

## 2015-07-29 LAB — COMPREHENSIVE METABOLIC PANEL
ALK PHOS: 102 U/L (ref 38–126)
ALT: 64 U/L — AB (ref 14–54)
AST: 38 U/L (ref 15–41)
Albumin: 3.3 g/dL — ABNORMAL LOW (ref 3.5–5.0)
Anion gap: 7 (ref 5–15)
BUN: 5 mg/dL — AB (ref 6–20)
CALCIUM: 7.9 mg/dL — AB (ref 8.9–10.3)
CO2: 23 mmol/L (ref 22–32)
CREATININE: 0.74 mg/dL (ref 0.44–1.00)
Chloride: 103 mmol/L (ref 101–111)
GFR calc non Af Amer: >60 mL/min (ref >60)
Glucose, Bld: 102 mg/dL — ABNORMAL HIGH (ref 65–99)
Potassium: 3.4 mmol/L — ABNORMAL LOW (ref 3.5–5.1)
SODIUM: 133 mmol/L — AB (ref 135–145)
Total Bilirubin: 0.6 mg/dL (ref 0.3–1.2)
Total Protein: 6.6 g/dL (ref 6.5–8.1)

## 2015-07-29 LAB — CBC
HCT: 35.9 % — ABNORMAL LOW (ref 36.0–46.0)
HEMOGLOBIN: 11.9 g/dL — AB (ref 12.0–15.0)
MCH: 28.1 pg (ref 26.0–34.0)
MCHC: 33.1 g/dL (ref 30.0–36.0)
MCV: 84.9 fL (ref 78.0–100.0)
Platelets: 159 10*3/uL (ref 150–400)
RBC: 4.23 MIL/uL (ref 3.87–5.11)
RDW: 14 % (ref 11.5–15.5)
WBC: 7.9 10*3/uL (ref 4.0–10.5)

## 2015-07-29 LAB — DIFFERENTIAL
BASOS ABS: 0 10*3/uL (ref 0.0–0.1)
BASOS PCT: 0 %
EOS ABS: 0.1 10*3/uL (ref 0.0–0.7)
EOS PCT: 2 %
Lymphocytes Relative: 20 %
Lymphs Abs: 1.6 10*3/uL (ref 0.7–4.0)
Monocytes Absolute: 1.4 10*3/uL — ABNORMAL HIGH (ref 0.1–1.0)
Monocytes Relative: 17 %
Neutro Abs: 4.9 10*3/uL (ref 1.7–7.7)
Neutrophils Relative %: 61 %

## 2015-07-29 LAB — MONONUCLEOSIS SCREEN: Mono Screen: NEGATIVE

## 2015-07-29 MED ORDER — ZOLPIDEM TARTRATE 5 MG PO TABS
5.0000 mg | ORAL_TABLET | Freq: Every evening | ORAL | Status: DC | PRN
  Administered 2015-07-29 (×2): 5 mg via ORAL
  Filled 2015-07-29 (×2): qty 1

## 2015-07-29 NOTE — Progress Notes (Signed)
Patient ID: Michelle Griffin, female   DOB: 1971-02-03, 45 y.o.   MRN: [place]:[id number]  Pt febrile to 100.8, at 2am Had chest achiness - nl EKG, some anxiety Given Motrin/Ambien slept the remainder of night.

## 2015-07-29 NOTE — Consult Note (Signed)
[facility] for Infectious Disease    Date of Admission:  07/27/2015   Total days of antibiotics 4        Day 3 piperacillin tazobactam        Day 3 azithromycin             Reason for Consult: Unexplained fever    Referring Physician: Dr. Paula Compton  Principal Problem:   Fever postop Active Problems:   Cervical dysplasia   Menorrhagia   Uterine fibroid   . azithromycin  500 mg Intravenous Q24H  . docusate sodium  100 mg Oral BID  . famotidine  40 mg Oral Daily  . fluticasone  2 spray Each Nare Daily  . levocetirizine  5 mg Oral QHS  . loratadine  10 mg Oral Daily  . montelukast  10 mg Oral QHS  . piperacillin-tazobactam (ZOSYN)  IV  3.375 g Intravenous 3 times per day  . sertraline  100 mg Oral Daily  . tolterodine  2 mg Oral QHS    Recommendations: 1. Continue current antibiotics 2. EBV and CMV serologies   Assessment: The cause of her recent fevers is unclear. Although she has some pyuria and a positive urine culture her clinical illness does not suggest a symptomatic urinary tract infection. The onset of fever 24 hours after her ablation suggest some possibility that she has mild, early endometritis. It is also possible she could have a mononucleosis syndrome given the elevation of her liver transaminases and an absolute monocytosis. I will check EBV and CMV serologies and follow-up tomorrow.    HPI: Michelle Griffin is a 45 y.o. female with history of uterine fibroids, menorrhagia and cervical dysplasia or who underwent uterine ablation and LEEP on 07/25/2015. She had some very mild uterine cramping that afternoon. The following afternoon she had sudden onset of extreme fatigue, muscle aches and headache. She then developed fever. She was seen in the office the following day with a temperature of 99.4. She had some bilateral, aching back pain. Her white blood cell count was 10,000. Her exam was not suggestive of endometritis. A urine culture was  obtained and she was started on ciprofloxacin and metronidazole. The following day she had persistent fever with a temperature of over 101 leading to admission. She was started on empiric piperacillin tazobactam and azithromycin. Blood cultures were obtained and remained negative. A CT scan showed her uterine fibroids but no obvious infection. UA revealed some microscopic pyuria and hematuria. Her liver transaminases have been slightly elevated. Her white blood cell count is normal but she does have an absolute monocytosis. Influenza PCR's were negative. Monospot was negative. Her office urine culture grew group B strep.   Review of Systems: Review of Systems  Constitutional: Positive for fever, chills, malaise/fatigue and diaphoresis. Negative for weight loss.  HENT: Negative for sore throat.   Eyes: Negative for redness.  Respiratory: Negative for cough, sputum production and shortness of breath.   Cardiovascular: Negative for chest pain.  Gastrointestinal: Positive for nausea, vomiting and constipation. Negative for abdominal pain and diarrhea.  Genitourinary: Negative for dysuria, urgency, frequency and flank pain.  Musculoskeletal: Positive for myalgias and joint pain.  Skin: Negative for rash.  Neurological: Positive for weakness and headaches. Negative for dizziness.  Psychiatric/Behavioral: Negative for depression.    Past Medical History  Diagnosis Date  . Asthma   . Hyperlipidemia 2012    was on pravastatin prior  . Anemia  hx/o iron deficiency due to heavy periods  . Migraine     prior eval with neurologist, Dr. Melton Alar  . Pyelonephritis     age 29yo  . DDD (degenerative disc disease), lumbar   . Hiatal hernia     small per EGD, [name]  . H/O mammogram 03/2013  . Routine gynecological examination     [place], [name], NP  . Overactive bladder   . PMDD (premenstrual dysphoric disorder)   . Allergy     sees [name]  . GERD (gastroesophageal  reflux disease)     [name]  . Vitamin D deficiency   . Fatty liver   . OAB (overactive bladder)   . Wears glasses     Social History  Substance Use Topics  . Smoking status: Never Smoker   . Smokeless tobacco: None  . Alcohol Use: 8.4 oz/week    14 Cans of beer per week     Comment: socially    Family History  Problem Relation Age of Onset  . Lupus Mother   . Hypertension Mother   . Hyperlipidemia Mother   . Migraines Mother   . Hypertension Father   . Diabetes Father   . Hyperlipidemia Father   . Obesity Sister   . Hypertension Sister   . Diabetes Sister   . Heart disease Paternal Uncle     CABG  . Hyperlipidemia Paternal Uncle   . Stroke Neg Hx   . Cancer Other     colon   Allergies  Allergen Reactions  . Sulfa Antibiotics Hives    OBJECTIVE: Blood pressure 110/85, pulse 75, temperature 97.9 F (36.6 C), temperature source Oral, resp. rate 18, height 5\' 10"  (1.778 m), weight 234 lb 12.8 oz (106.505 kg), SpO2 97 %.  Physical Exam  Constitutional: She is oriented to person, place, and time. No distress.  She looks worn out. Her husband is with her.  HENT:  Mouth/Throat: No oropharyngeal exudate.  Eyes: Conjunctivae are normal.  Neck: Neck supple.  Small, stable left posterior cervical nodes that were noted on CT scan last year.  Cardiovascular: Normal rate and regular rhythm.   No murmur heard. Pulmonary/Chest: Effort normal and breath sounds normal. She has no wheezes. She has no rales.  Abdominal: Soft. Bowel sounds are normal. She exhibits no mass. There is no tenderness.  Musculoskeletal: Normal range of motion. She exhibits no edema.  Lymphadenopathy:       Head (right side): No submental and no submandibular adenopathy present.       Head (left side): No submental and no submandibular adenopathy present.    She has no cervical adenopathy.    She has no axillary adenopathy.  Neurological: She is alert and oriented to person, place, and time.    Skin: No rash noted.  Psychiatric: Mood and affect normal.    Lab Results Lab Results  Component Value Date   WBC 7.9 07/29/2015   HGB 11.9* 07/29/2015   HCT 35.9* 07/29/2015   MCV 84.9 07/29/2015   PLT 159 07/29/2015    Lab Results  Component Value Date   CREATININE 0.74 07/29/2015   BUN 5* 07/29/2015   NA 133* 07/29/2015   K 3.4* 07/29/2015   CL 103 07/29/2015   CO2 23 07/29/2015    Lab Results  Component Value Date   ALT 64* 07/29/2015   AST 38 07/29/2015   ALKPHOS 102 07/29/2015   BILITOT 0.6 07/29/2015     Microbiology: Recent  Results (from the past 240 hour(s))  Culture, blood (routine x 2)     Status: None (Preliminary result)   Collection Time: 07/27/15  1:01 PM  Result Value Ref Range Status   Specimen Description BLOOD RIGHT ANTECUBITAL  Final   Special Requests BOTTLES DRAWN AEROBIC AND ANAEROBIC 5CC  Final   Culture   Final    NO GROWTH < 24 HOURS Performed at [facility]    Report Status PENDING  Incomplete  Culture, blood (routine x 2)     Status: None (Preliminary result)   Collection Time: 07/27/15  1:11 PM  Result Value Ref Range Status   Specimen Description BLOOD RIGHT HAND  Final   Special Requests BOTTLES DRAWN AEROBIC AND ANAEROBIC 5CC  Final   Culture   Final    NO GROWTH < 24 HOURS Performed at [facility]    Report Status PENDING  Incomplete    Michel Bickers, MD [place] [facility] [phone] pager   [phone] cell 07/29/2015, 12:24 PM

## 2015-07-29 NOTE — Progress Notes (Addendum)
Patient ID: Michelle Griffin, female   DOB: 03-15-1971, 45 y.o.   MRN: [place]:[id number] HD #3 Pt still feels fatigued and lethargic. Pt had an episode of chest tightness last pm, EKG WNL  Resolved quickly with rest No abdominal pain at all, occasional nausea and headache.   Still sleepy but received ambien at 100am. No BM since admission,  eating without N/V but small meals due to decreased appetite  Tmax 100.8 (2207pm) Tnow 98.2 Abdomen soft NT no uterine tenderness on deep palpation  WBC 7.9 today  Prior days 4.7 / 6.9 LFT's stable-mild elevation (h/o fatty liver) CT scan negative 2/25 for any intrapelvic abscess, no evidence bowel injury or uterine injury from ablation procedure Urine cx (in office)  07/26/15  GBS >100K Blood cx negative thus far Flu swab negative/mono negative  Zosyn Day 3 (8am/4pm/12am dosing) /Azithromycin Day 3 (2200pm dosing)  Pt with fever of unknown etiology, s/p Novasure ablation/LEEP 07/24/15 Fever improved but not resolved.  Last ibuprofen 0130am.     Pt clinically has never had abdominal pain  that was significant, mostly just tired and lethargic feeling. I have asked for ID consult from Dr. Megan Salon to make sure no other possible etiologies we are missing.

## 2015-07-29 NOTE — Progress Notes (Signed)
Patient complaint of "nagging pain in my chest like a toothache." Vital signs taken and recorded.  Patient alert and oriented.  OOB to bathroom with stand by assist, stable gait and void without difficulty.  Patient not in distress. Dr. Melba Coon updated.  EKG ordered and done by RT.  Result called to Dr. Melba Coon.

## 2015-07-30 DIAGNOSIS — R5082 Postprocedural fever: Principal | ICD-10-CM

## 2015-07-30 LAB — CBC WITH DIFFERENTIAL/PLATELET
Basophils Absolute: 0 10*3/uL (ref 0.0–0.1)
Basophils Relative: 0 %
EOS PCT: 1 %
Eosinophils Absolute: 0.1 10*3/uL (ref 0.0–0.7)
HCT: 36.7 % (ref 36.0–46.0)
Hemoglobin: 12.8 g/dL (ref 12.0–15.0)
LYMPHS PCT: 19 %
Lymphs Abs: 1.8 10*3/uL (ref 0.7–4.0)
MCH: 29.3 pg (ref 26.0–34.0)
MCHC: 34.9 g/dL (ref 30.0–36.0)
MCV: 84 fL (ref 78.0–100.0)
MONO ABS: 1.5 10*3/uL — AB (ref 0.1–1.0)
Monocytes Relative: 15 %
Neutro Abs: 6.3 10*3/uL (ref 1.7–7.7)
Neutrophils Relative %: 65 %
PLATELETS: 182 10*3/uL (ref 150–400)
RBC: 4.37 MIL/uL (ref 3.87–5.11)
RDW: 13.9 % (ref 11.5–15.5)
WBC: 9.7 10*3/uL (ref 4.0–10.5)

## 2015-07-30 LAB — COMPREHENSIVE METABOLIC PANEL
ALT: 55 U/L — AB (ref 14–54)
AST: 26 U/L (ref 15–41)
Albumin: 3.4 g/dL — ABNORMAL LOW (ref 3.5–5.0)
Alkaline Phosphatase: 114 U/L (ref 38–126)
Anion gap: 7 (ref 5–15)
BILIRUBIN TOTAL: 0.8 mg/dL (ref 0.3–1.2)
BUN: 7 mg/dL (ref 6–20)
CALCIUM: 8.8 mg/dL — AB (ref 8.9–10.3)
CO2: 25 mmol/L (ref 22–32)
Chloride: 103 mmol/L (ref 101–111)
Creatinine, Ser: 0.91 mg/dL (ref 0.44–1.00)
GFR calc Af Amer: >60 mL/min (ref >60)
Glucose, Bld: 102 mg/dL — ABNORMAL HIGH (ref 65–99)
Potassium: 3.6 mmol/L (ref 3.5–5.1)
Sodium: 135 mmol/L (ref 135–145)
Total Protein: 6.7 g/dL (ref 6.5–8.1)

## 2015-07-30 LAB — EPSTEIN-BARR VIRUS EARLY D ANTIGEN ANTIBODY, IGG: EBV EARLY ANTIGEN AB, IGG: 29.1 U/mL — AB (ref 0.0–8.9)

## 2015-07-30 LAB — EBV AB TO VIRAL CAPSID AG PNL, IGG+IGM: EBV VCA IgG: 213 U/mL — ABNORMAL HIGH (ref 0.0–17.9)

## 2015-07-30 LAB — CMV IGM

## 2015-07-30 MED ORDER — DOXYCYCLINE HYCLATE 100 MG PO TABS
100.0000 mg | ORAL_TABLET | Freq: Two times a day (BID) | ORAL | Status: DC
  Administered 2015-07-30: 100 mg via ORAL
  Filled 2015-07-30 (×2): qty 1

## 2015-07-30 MED ORDER — DOXYCYCLINE HYCLATE 100 MG PO TABS
100.0000 mg | ORAL_TABLET | Freq: Two times a day (BID) | ORAL | Status: DC
Start: 2015-07-30 — End: 2016-02-17

## 2015-07-30 NOTE — Discharge Summary (Signed)
Physician Discharge Summary  Patient ID: Michelle Griffin MRN: [id number] DOB/AGE: 09/13/1970 45 y.o.  Admit date: 07/27/2015 Discharge date: 07/30/2015  Admission Diagnoses:  Fever  Discharge Diagnoses:  Principal Problem:   Fever postop Active Problems:   Cervical dysplasia   Menorrhagia   Uterine fibroid   Discharged Condition: good  Hospital Course: Pt admitted when 45 hours after a Novasure ablation and LEEP procedure she developed a high fever to 102 and general malaise and chills.  Her clinical picture was not consistent with a severe intraabdominal process as her exam was benign abdominally.  Given the timing following ablation, a CT scan with contrast was performed 07/27/15 and showed no evidence of uterine perforation or bowel injury.  WBC was normal on admission and remained normal throughout her stay with a slight monocytosis.  She was treated for a presumed endometritis following ablation with zosyn and azithromycin. Given her perplexing clinical presentation of little abdominal or uterine pain, other infectious causes were pursued and r/o.  She had a negative flu swab, negative CMV, monospot, and EBV titres.  An ID consult was obtained to look for these less common etiologies.  Her fever improved by HD #2 but did not completely resolve until 07/29/15 and at d/c she was afebrile 24 hours with no motrin or tylenol given in > 36 hours.  She felt improved, though still a bit fatigued.  She began her menses day of d/c and was lighter than typically she has--c/w with her recent ablation.  She was d/c home on doxycycline x 5 more days.  Consults: ID  Significant Diagnostic Studies: labs: CBC CMET Infectious titers                                                         CT scan with contrast                                                        Blood cultures  Treatments: antibiotics: Zosyn and azithromycin  Discharge Exam: Blood pressure 99/50, pulse 77, temperature 98.2 F (36.8 C),  temperature source Oral, resp. rate 18, height 5\' 10"  (1.778 m), weight 106.505 kg (234 lb 12.8 oz), SpO2 97 %. General appearance: alert and cooperative  Abdomen soft and NT   Disposition: 01-Home or Self Care  Discharge Instructions    Diet - low sodium heart healthy    Complete by:  As directed      Discharge instructions    Complete by:  As directed   Nothing in vagina x 2 weeks.  Call for temperature higher than 99.5.     Increase activity slowly    Complete by:  As directed             Medication List    STOP taking these medications        CIPRO 500 MG tablet  Generic drug:  ciprofloxacin     esomeprazole 40 MG capsule  Commonly known as:  NEXIUM     meloxicam 15 MG tablet  Commonly known as:  MOBIC     metaxalone 800 MG tablet  Commonly known as:  SKELAXIN  methylPREDNISolone 4 MG tablet  Commonly known as:  MEDROL     metroNIDAZOLE 500 MG tablet  Commonly known as:  FLAGYL     naltrexone 50 MG tablet  Commonly known as:  DEPADE     predniSONE 10 MG tablet  Commonly known as:  DELTASONE     traMADol 50 MG tablet  Commonly known as:  ULTRAM     triamcinolone cream 0.1 %  Commonly known as:  KENALOG      TAKE these medications        albuterol 108 (90 Base) MCG/ACT inhaler  Commonly known as:  PROVENTIL HFA;VENTOLIN HFA  Inhale 2 puffs into the lungs every 6 (six) hours as needed for wheezing or shortness of breath.     Biotin 10 MG Caps  Take by mouth.     doxycycline 100 MG tablet  Commonly known as:  VIBRA-TABS  Take 1 tablet (100 mg total) by mouth every 12 (twelve) hours.     EPINEPHrine 0.3 mg/0.3 mL Soaj injection  Commonly known as:  EPI-PEN  Inject 0.3 mg into the muscle once.     famotidine 40 MG tablet  Commonly known as:  PEPCID  TAKE 1 TABLET BY MOUTH DAILY.     FLONASE 50 MCG/ACT nasal spray  Generic drug:  fluticasone  Place into both nostrils daily.     ipratropium 0.06 % nasal spray  Commonly known as:  ATROVENT   Place 2 sprays into both nostrils 4 (four) times daily.     levocetirizine 5 MG tablet  Commonly known as:  XYZAL  Take 5 mg by mouth every evening.     loratadine 10 MG tablet  Commonly known as:  CLARITIN  Take 10 mg by mouth daily.     montelukast 10 MG tablet  Commonly known as:  SINGULAIR  Take 10 mg by mouth at bedtime.     sertraline 100 MG tablet  Commonly known as:  ZOLOFT  Take 1 tablet (100 mg total) by mouth daily.     tolterodine 2 MG tablet  Commonly known as:  DETROL  Take 2 mg by mouth at bedtime.           Follow-up Information    Follow up with Logan Bores, MD. Schedule an appointment as soon as possible for a visit in 2 weeks.   Specialty:  Obstetrics and Gynecology   Why:  recheck LEEP    Contact information:   [address] [place] [address] [phone]       Signed: Logan Bores 07/30/2015, 6:40 PM

## 2015-07-30 NOTE — Progress Notes (Signed)
Patient ID: Michelle Griffin, female   DOB: August 10, 1970, 45 y.o.   MRN: [id number]         [facility]    Date of Admission:  07/27/2015   Total days of antibiotics 5        Day 4 piperacillin tazobactam         Day 4 azithromycin         Principal Problem:   Fever postop Active Problems:   Cervical dysplasia   Menorrhagia   Uterine fibroid   . azithromycin  500 mg Intravenous Q24H  . docusate sodium  100 mg Oral BID  . famotidine  40 mg Oral Daily  . fluticasone  2 spray Each Nare Daily  . levocetirizine  5 mg Oral QHS  . loratadine  10 mg Oral Daily  . montelukast  10 mg Oral QHS  . piperacillin-tazobactam (ZOSYN)  IV  3.375 g Intravenous 3 times per day  . sertraline  100 mg Oral Daily  . tolterodine  2 mg Oral QHS    SUBJECTIVE: She states that she is beginning to feel a little bit better. She is still quite fatigued but feeling stronger and not having as many myalgias.  Review of Systems: Review of Systems  Constitutional: Positive for malaise/fatigue. Negative for fever, chills, weight loss and diaphoresis.  HENT: Negative for sore throat.   Respiratory: Negative for cough, sputum production and shortness of breath.   Cardiovascular: Negative for chest pain.  Gastrointestinal: Negative for nausea, vomiting and diarrhea.  Genitourinary: Negative for dysuria and frequency.  Musculoskeletal: Positive for myalgias. Negative for joint pain.  Skin: Negative for rash.  Neurological: Positive for weakness. Negative for dizziness and headaches.    Past Medical History  Diagnosis Date  . Asthma   . Hyperlipidemia 2012    was on pravastatin prior  . Anemia     hx/o iron deficiency due to heavy periods  . Migraine     prior eval with neurologist, Dr. Melton Alar  . Pyelonephritis     age 79yo  . DDD (degenerative disc disease), lumbar   . Hiatal hernia     small per EGD, [name]  . H/O mammogram 03/2013  . Routine gynecological examination     [place], [name], NP  . Overactive bladder   . PMDD (premenstrual dysphoric disorder)   . Allergy     sees [name]  . GERD (gastroesophageal reflux disease)     [name]  . Vitamin D deficiency   . Fatty liver   . OAB (overactive bladder)   . Wears glasses     Social History  Substance Use Topics  . Smoking status: Never Smoker   . Smokeless tobacco: None  . Alcohol Use: 8.4 oz/week    14 Cans of beer per week     Comment: socially    Family History  Problem Relation Age of Onset  . Lupus Mother   . Hypertension Mother   . Hyperlipidemia Mother   . Migraines Mother   . Hypertension Father   . Diabetes Father   . Hyperlipidemia Father   . Obesity Sister   . Hypertension Sister   . Diabetes Sister   . Heart disease Paternal Uncle     CABG  . Hyperlipidemia Paternal Uncle   . Stroke Neg Hx   . Cancer Other     colon   Allergies  Allergen Reactions  . Sulfa Antibiotics Hives  OBJECTIVE: Filed Vitals:   07/29/15 1856 07/29/15 2122 07/30/15 0532 07/30/15 1200  BP:  96/54 106/48 99/50  Pulse:  83 75 77  Temp: 98.7 F (37.1 C) 98.5 F (36.9 C) 98.5 F (36.9 C) 98.8 F (37.1 C)  TempSrc: Oral Oral Oral Oral  Resp:    18  Height:      Weight:      SpO2:  99% 94% 97%   Body mass index is 33.69 kg/(m^2).  Physical Exam  Constitutional: She is oriented to person, place, and time.  She was sleeping but arouses easily. She still looks worn out but otherwise in no distress.  HENT:  Mouth/Throat: No oropharyngeal exudate.  Eyes: Conjunctivae are normal.  Neck: Neck supple.  Cardiovascular: Normal rate and regular rhythm.   No murmur heard. Pulmonary/Chest: Breath sounds normal.  Abdominal: Soft. She exhibits no mass. There is no tenderness.  Musculoskeletal: Normal range of motion.  Neurological: She is alert and oriented to person, place, and time.  Skin: No rash noted.  Psychiatric: Mood and affect normal.    Lab Results Lab  Results  Component Value Date   WBC 9.7 07/30/2015   HGB 12.8 07/30/2015   HCT 36.7 07/30/2015   MCV 84.0 07/30/2015   PLT 182 07/30/2015    Lab Results  Component Value Date   CREATININE 0.91 07/30/2015   BUN 7 07/30/2015   NA 135 07/30/2015   K 3.6 07/30/2015   CL 103 07/30/2015   CO2 25 07/30/2015    Lab Results  Component Value Date   ALT 55* 07/30/2015   AST 26 07/30/2015   ALKPHOS 114 07/30/2015   BILITOT 0.8 07/30/2015    CMV IgM 07/29/2015: Negative Epstein-Barr virus serology 07/29/2015: Compatible with remote, inactive infection  Microbiology: Recent Results (from the past 240 hour(s))  Culture, blood (routine x 2)     Status: None (Preliminary result)   Collection Time: 07/27/15  1:01 PM  Result Value Ref Range Status   Specimen Description BLOOD RIGHT ANTECUBITAL  Final   Special Requests BOTTLES DRAWN AEROBIC AND ANAEROBIC 5CC  Final   Culture   Final    NO GROWTH 2 DAYS Performed at [facility]    Report Status PENDING  Incomplete  Culture, blood (routine x 2)     Status: None (Preliminary result)   Collection Time: 07/27/15  1:11 PM  Result Value Ref Range Status   Specimen Description BLOOD RIGHT HAND  Final   Special Requests BOTTLES DRAWN AEROBIC AND ANAEROBIC 5CC  Final   Culture   Final    NO GROWTH 2 DAYS Performed at [facility]    Report Status PENDING  Incomplete     ASSESSMENT: I suspect that her recent fevers were related in some way to her recent uterine ablation. She does not have any evidence of active EBV or CMV infections. I think it is reasonable to consider discharge home this afternoon. I would consider another 5 days of doxycycline 100 mg twice a day.  PLAN: 1. Recommend discharge home on 5 more days of doxycycline 2. I will sign off now.  Michel Bickers, MD [facility] [phone] pager   [phone] cell 07/30/2015, 1:55 PM

## 2015-07-30 NOTE — Progress Notes (Signed)
Patient ID: Bryan H Hank, female   DOB: 04-20-1971, 45 y.o.   MRN: [id number] Pt feeling better.  Has eaten better today and been up and ambulated.   Occasional chill but afebrile all day.  She has had no pain or body aches. VB has been lighter, second day is usually heaviest  afeb x 24 hours abd soft NT  Per ID will d/c home on Doxycycline F/u prn or in 2 weeks to check LEEP bed

## 2015-07-30 NOTE — Progress Notes (Signed)
Patient A/O x4. No c/o pain. Vital signs WNL. Reviewed patients discharge instructions and questions answered. Patients home meds given and accounted for. Belongings accounted for. Patient escorted by nursing staff to vehicle.

## 2015-07-30 NOTE — Progress Notes (Signed)
Patient ID: Michelle Griffin, female   DOB: Apr 16, 1971, 45 y.o.   MRN: [id number] HD #4 Pt still feels fatigued.  Began a period last night. Still no BM since admission.  Ambulated short distances yesterday.  Tmax 99.8 (1800 07/29/15)  Tnow 98.5 Abdomen soft NT  WBC today 9.7 (monos 1.5)  Prior days 4.7 / 6.9/7.9 LFT's slowly normalizing CT scan negative 2/25 for any intrapelvic abscess, no evidence bowel injury or uterine injury from ablation procedure Urine cx (in office) 07/26/15 GBS >100K Blood cx negative  Flu swab negative/mono negative CMV negative EBV pending  Zosyn Day 4 (8am/4pm/12am dosing) /Azithromycin Day 4 (2200pm dosing)  Pt with fever of unknown etiology, s/p Novasure ablation/LEEP 07/24/15 Last fever 99.8 (1800 07/29/15)  Last ibuprofen 0130am 07/29/15  D/w pt seems to be turning the corner on the fever, but would like to see her feeling a bit better before d/c. Encouraged to walk and eat more today. Will await Dr, Megan Salon input and final on EBV, but if afebrile today possible d/c this PM.  If no clear etiology discovered would lean towards d/c home on po antibiotics. Has flagyl at home and could add doxycycline or augmentin?

## 2015-07-31 MED FILL — DOXYCYCLINE HYCLATE 100 MG: 100 | 5 days supply | Qty: 10 | Fill #0

## 2015-08-01 LAB — CULTURE, BLOOD (ROUTINE X 2)
Culture: NO GROWTH
Culture: NO GROWTH

## 2015-08-01 MED FILL — FLUCONAZOLE 150 MG TABLET: 150 | 5 days supply | Qty: 5 | Fill #0

## 2015-08-12 MED FILL — SERTRALINE HCL 100 MG TAB: 100 | 30 days supply | Qty: 30 | Fill #1

## 2015-08-12 MED FILL — FAMOTIDINE 40 MG TABLET: 40 | 30 days supply | Qty: 30 | Fill #1

## 2015-08-13 DIAGNOSIS — Z09 Encounter for follow-up examination after completed treatment for conditions other than malignant neoplasm: Secondary | ICD-10-CM | POA: Diagnosis not present

## 2015-08-20 DIAGNOSIS — J3081 Allergic rhinitis due to animal (cat) (dog) hair and dander: Secondary | ICD-10-CM | POA: Diagnosis not present

## 2015-08-20 DIAGNOSIS — J3089 Other allergic rhinitis: Secondary | ICD-10-CM | POA: Diagnosis not present

## 2015-08-20 DIAGNOSIS — J301 Allergic rhinitis due to pollen: Secondary | ICD-10-CM | POA: Diagnosis not present

## 2015-08-20 MED FILL — LEVOCETIRIZINE 5 MG TABLET: 5 | 30 days supply | Qty: 30 | Fill #0

## 2015-08-20 MED FILL — TOLTERODINE TARTRATE 2 MG T: 2 | 30 days supply | Qty: 60 | Fill #1

## 2015-08-20 MED FILL — MONTELUKAST SOD 10 MG TAB: 10 | 30 days supply | Qty: 30 | Fill #0

## 2015-08-22 DIAGNOSIS — J3081 Allergic rhinitis due to animal (cat) (dog) hair and dander: Secondary | ICD-10-CM | POA: Diagnosis not present

## 2015-08-22 DIAGNOSIS — J3089 Other allergic rhinitis: Secondary | ICD-10-CM | POA: Diagnosis not present

## 2015-08-22 DIAGNOSIS — J301 Allergic rhinitis due to pollen: Secondary | ICD-10-CM | POA: Diagnosis not present

## 2015-08-28 DIAGNOSIS — J301 Allergic rhinitis due to pollen: Secondary | ICD-10-CM | POA: Diagnosis not present

## 2015-08-28 DIAGNOSIS — J3089 Other allergic rhinitis: Secondary | ICD-10-CM | POA: Diagnosis not present

## 2015-09-04 DIAGNOSIS — J3081 Allergic rhinitis due to animal (cat) (dog) hair and dander: Secondary | ICD-10-CM | POA: Diagnosis not present

## 2015-09-04 DIAGNOSIS — J3089 Other allergic rhinitis: Secondary | ICD-10-CM | POA: Diagnosis not present

## 2015-09-04 DIAGNOSIS — J301 Allergic rhinitis due to pollen: Secondary | ICD-10-CM | POA: Diagnosis not present

## 2015-09-09 MED FILL — SERTRALINE HCL 100 MG TAB: 100 | 30 days supply | Qty: 30 | Fill #2

## 2015-09-10 MED FILL — EPINEPHRINE 0.3 MG AUTO-INJ: 0.3 | 30 days supply | Qty: 2 | Fill #0

## 2015-09-10 MED FILL — FAMOTIDINE 40 MG TABLET: 40 | 30 days supply | Qty: 30 | Fill #2

## 2015-09-12 DIAGNOSIS — J3081 Allergic rhinitis due to animal (cat) (dog) hair and dander: Secondary | ICD-10-CM | POA: Diagnosis not present

## 2015-09-12 DIAGNOSIS — J3089 Other allergic rhinitis: Secondary | ICD-10-CM | POA: Diagnosis not present

## 2015-09-12 DIAGNOSIS — J301 Allergic rhinitis due to pollen: Secondary | ICD-10-CM | POA: Diagnosis not present

## 2015-09-18 DIAGNOSIS — J3089 Other allergic rhinitis: Secondary | ICD-10-CM | POA: Diagnosis not present

## 2015-09-18 DIAGNOSIS — J301 Allergic rhinitis due to pollen: Secondary | ICD-10-CM | POA: Diagnosis not present

## 2015-09-18 DIAGNOSIS — J3081 Allergic rhinitis due to animal (cat) (dog) hair and dander: Secondary | ICD-10-CM | POA: Diagnosis not present

## 2015-09-19 MED FILL — LEVOCETIRIZINE 5 MG TABLET: 5 | 30 days supply | Qty: 30 | Fill #1

## 2015-09-25 MED FILL — MONTELUKAST SOD 10 MG TAB: 10 | 30 days supply | Qty: 30 | Fill #0

## 2015-09-26 DIAGNOSIS — J3081 Allergic rhinitis due to animal (cat) (dog) hair and dander: Secondary | ICD-10-CM | POA: Diagnosis not present

## 2015-09-26 DIAGNOSIS — J301 Allergic rhinitis due to pollen: Secondary | ICD-10-CM | POA: Diagnosis not present

## 2015-09-26 DIAGNOSIS — J3089 Other allergic rhinitis: Secondary | ICD-10-CM | POA: Diagnosis not present

## 2015-10-01 DIAGNOSIS — J3089 Other allergic rhinitis: Secondary | ICD-10-CM | POA: Diagnosis not present

## 2015-10-01 DIAGNOSIS — J3081 Allergic rhinitis due to animal (cat) (dog) hair and dander: Secondary | ICD-10-CM | POA: Diagnosis not present

## 2015-10-01 DIAGNOSIS — J301 Allergic rhinitis due to pollen: Secondary | ICD-10-CM | POA: Diagnosis not present

## 2015-10-04 DIAGNOSIS — H1045 Other chronic allergic conjunctivitis: Secondary | ICD-10-CM | POA: Diagnosis not present

## 2015-10-04 DIAGNOSIS — J4520 Mild intermittent asthma, uncomplicated: Secondary | ICD-10-CM | POA: Diagnosis not present

## 2015-10-04 DIAGNOSIS — J3089 Other allergic rhinitis: Secondary | ICD-10-CM | POA: Diagnosis not present

## 2015-10-04 DIAGNOSIS — J301 Allergic rhinitis due to pollen: Secondary | ICD-10-CM | POA: Diagnosis not present

## 2015-10-04 DIAGNOSIS — J3081 Allergic rhinitis due to animal (cat) (dog) hair and dander: Secondary | ICD-10-CM | POA: Diagnosis not present

## 2015-10-04 MED FILL — AZELASTINE HCL 0.05% DROPS: 0.05 | 30 days supply | Qty: 6 | Fill #0

## 2015-10-04 MED FILL — FLUTICASONE PROP 50 MCG SPR: 50 | 30 days supply | Qty: 16 | Fill #0

## 2015-10-04 MED FILL — AZELASTINE 0.1% (137 MCG) S: 0.1 | 30 days supply | Qty: 30 | Fill #0

## 2015-10-09 DIAGNOSIS — J3081 Allergic rhinitis due to animal (cat) (dog) hair and dander: Secondary | ICD-10-CM | POA: Diagnosis not present

## 2015-10-09 DIAGNOSIS — J301 Allergic rhinitis due to pollen: Secondary | ICD-10-CM | POA: Diagnosis not present

## 2015-10-09 DIAGNOSIS — J3089 Other allergic rhinitis: Secondary | ICD-10-CM | POA: Diagnosis not present

## 2015-10-09 MED FILL — FAMOTIDINE 40 MG TABLET: 40 | 30 days supply | Qty: 30 | Fill #3

## 2015-10-09 MED FILL — SERTRALINE HCL 100 MG TAB: 100 | 30 days supply | Qty: 30 | Fill #3 | Status: TO

## 2015-10-15 DIAGNOSIS — J301 Allergic rhinitis due to pollen: Secondary | ICD-10-CM | POA: Diagnosis not present

## 2015-10-15 DIAGNOSIS — J3089 Other allergic rhinitis: Secondary | ICD-10-CM | POA: Diagnosis not present

## 2015-10-15 DIAGNOSIS — J3081 Allergic rhinitis due to animal (cat) (dog) hair and dander: Secondary | ICD-10-CM | POA: Diagnosis not present

## 2015-10-17 DIAGNOSIS — Z1272 Encounter for screening for malignant neoplasm of vagina: Secondary | ICD-10-CM | POA: Diagnosis not present

## 2015-10-17 DIAGNOSIS — N871 Moderate cervical dysplasia: Secondary | ICD-10-CM | POA: Diagnosis not present

## 2015-10-17 DIAGNOSIS — J3081 Allergic rhinitis due to animal (cat) (dog) hair and dander: Secondary | ICD-10-CM | POA: Diagnosis not present

## 2015-10-17 DIAGNOSIS — N888 Other specified noninflammatory disorders of cervix uteri: Secondary | ICD-10-CM | POA: Diagnosis not present

## 2015-10-17 DIAGNOSIS — J301 Allergic rhinitis due to pollen: Secondary | ICD-10-CM | POA: Diagnosis not present

## 2015-10-17 DIAGNOSIS — J3089 Other allergic rhinitis: Secondary | ICD-10-CM | POA: Diagnosis not present

## 2015-10-22 MED FILL — MONTELUKAST SOD 10 MG TAB: 10 | 30 days supply | Qty: 30 | Fill #0

## 2015-10-22 MED FILL — LEVOCETIRIZINE 5 MG TABLET: 5 | 30 days supply | Qty: 30 | Fill #0

## 2015-10-23 DIAGNOSIS — J301 Allergic rhinitis due to pollen: Secondary | ICD-10-CM | POA: Diagnosis not present

## 2015-10-23 DIAGNOSIS — J3089 Other allergic rhinitis: Secondary | ICD-10-CM | POA: Diagnosis not present

## 2015-10-23 DIAGNOSIS — J3081 Allergic rhinitis due to animal (cat) (dog) hair and dander: Secondary | ICD-10-CM | POA: Diagnosis not present

## 2015-10-30 DIAGNOSIS — J301 Allergic rhinitis due to pollen: Secondary | ICD-10-CM | POA: Diagnosis not present

## 2015-10-30 DIAGNOSIS — J3089 Other allergic rhinitis: Secondary | ICD-10-CM | POA: Diagnosis not present

## 2015-10-30 DIAGNOSIS — J3081 Allergic rhinitis due to animal (cat) (dog) hair and dander: Secondary | ICD-10-CM | POA: Diagnosis not present

## 2015-11-05 DIAGNOSIS — J3089 Other allergic rhinitis: Secondary | ICD-10-CM | POA: Diagnosis not present

## 2015-11-05 DIAGNOSIS — J301 Allergic rhinitis due to pollen: Secondary | ICD-10-CM | POA: Diagnosis not present

## 2015-11-05 DIAGNOSIS — J3081 Allergic rhinitis due to animal (cat) (dog) hair and dander: Secondary | ICD-10-CM | POA: Diagnosis not present

## 2015-11-06 ENCOUNTER — Other Ambulatory Visit: Payer: Self-pay | Admitting: Medical

## 2015-11-06 MED FILL — SERTRALINE HCL 100 MG TAB: 100 | 30 days supply | Qty: 30 | Fill #0 | Status: TO

## 2015-11-06 MED FILL — NALTREXONE 50 MG TABLET: 50 | 60 days supply | Qty: 30 | Fill #0

## 2015-11-06 MED FILL — TOLTERODINE TARTRATE 2 MG T: 2 | 30 days supply | Qty: 60 | Fill #2 | Status: TO

## 2015-11-06 NOTE — Telephone Encounter (Signed)
Is this ok to refill? Not on current med list

## 2015-11-07 DIAGNOSIS — J3089 Other allergic rhinitis: Secondary | ICD-10-CM | POA: Diagnosis not present

## 2015-11-07 DIAGNOSIS — J301 Allergic rhinitis due to pollen: Secondary | ICD-10-CM | POA: Diagnosis not present

## 2015-11-07 DIAGNOSIS — J3081 Allergic rhinitis due to animal (cat) (dog) hair and dander: Secondary | ICD-10-CM | POA: Diagnosis not present

## 2015-11-12 DIAGNOSIS — J3081 Allergic rhinitis due to animal (cat) (dog) hair and dander: Secondary | ICD-10-CM | POA: Diagnosis not present

## 2015-11-12 DIAGNOSIS — J301 Allergic rhinitis due to pollen: Secondary | ICD-10-CM | POA: Diagnosis not present

## 2015-11-12 DIAGNOSIS — J3089 Other allergic rhinitis: Secondary | ICD-10-CM | POA: Diagnosis not present

## 2015-11-13 ENCOUNTER — Other Ambulatory Visit: Payer: Self-pay | Admitting: Medical

## 2015-11-13 MED FILL — FAMOTIDINE 40 MG TABLET: 40 | 30 days supply | Qty: 30 | Fill #0

## 2015-11-18 MED FILL — LEVOCETIRIZINE 5 MG TABLET: 5 | 30 days supply | Qty: 30 | Fill #1

## 2015-11-18 MED FILL — MONTELUKAST SOD 10 MG TAB: 10 | 30 days supply | Qty: 30 | Fill #1

## 2015-11-19 DIAGNOSIS — J3081 Allergic rhinitis due to animal (cat) (dog) hair and dander: Secondary | ICD-10-CM | POA: Diagnosis not present

## 2015-11-19 DIAGNOSIS — J3089 Other allergic rhinitis: Secondary | ICD-10-CM | POA: Diagnosis not present

## 2015-11-19 DIAGNOSIS — J301 Allergic rhinitis due to pollen: Secondary | ICD-10-CM | POA: Diagnosis not present

## 2015-11-25 DIAGNOSIS — J301 Allergic rhinitis due to pollen: Secondary | ICD-10-CM | POA: Diagnosis not present

## 2015-11-25 DIAGNOSIS — J3089 Other allergic rhinitis: Secondary | ICD-10-CM | POA: Diagnosis not present

## 2015-11-25 DIAGNOSIS — J3081 Allergic rhinitis due to animal (cat) (dog) hair and dander: Secondary | ICD-10-CM | POA: Diagnosis not present

## 2015-11-27 DIAGNOSIS — J301 Allergic rhinitis due to pollen: Secondary | ICD-10-CM | POA: Diagnosis not present

## 2015-11-27 DIAGNOSIS — J3081 Allergic rhinitis due to animal (cat) (dog) hair and dander: Secondary | ICD-10-CM | POA: Diagnosis not present

## 2015-11-27 DIAGNOSIS — J3089 Other allergic rhinitis: Secondary | ICD-10-CM | POA: Diagnosis not present

## 2015-12-04 DIAGNOSIS — J3089 Other allergic rhinitis: Secondary | ICD-10-CM | POA: Diagnosis not present

## 2015-12-04 DIAGNOSIS — J301 Allergic rhinitis due to pollen: Secondary | ICD-10-CM | POA: Diagnosis not present

## 2015-12-04 DIAGNOSIS — J3081 Allergic rhinitis due to animal (cat) (dog) hair and dander: Secondary | ICD-10-CM | POA: Diagnosis not present

## 2015-12-09 MED FILL — FAMOTIDINE 40 MG TABLET: 40 | 30 days supply | Qty: 30 | Fill #1 | Status: TO

## 2015-12-09 MED FILL — SERTRALINE HCL 100 MG TAB: 100 | 30 days supply | Qty: 30 | Fill #0 | Status: TO

## 2015-12-09 MED FILL — TOLTERODINE TARTRATE 2 MG T: 2 | 30 days supply | Qty: 60 | Fill #0 | Status: TO

## 2015-12-10 DIAGNOSIS — J3081 Allergic rhinitis due to animal (cat) (dog) hair and dander: Secondary | ICD-10-CM | POA: Diagnosis not present

## 2015-12-10 DIAGNOSIS — J301 Allergic rhinitis due to pollen: Secondary | ICD-10-CM | POA: Diagnosis not present

## 2015-12-10 DIAGNOSIS — J3089 Other allergic rhinitis: Secondary | ICD-10-CM | POA: Diagnosis not present

## 2015-12-18 DIAGNOSIS — J3089 Other allergic rhinitis: Secondary | ICD-10-CM | POA: Diagnosis not present

## 2015-12-18 DIAGNOSIS — J301 Allergic rhinitis due to pollen: Secondary | ICD-10-CM | POA: Diagnosis not present

## 2015-12-18 DIAGNOSIS — J3081 Allergic rhinitis due to animal (cat) (dog) hair and dander: Secondary | ICD-10-CM | POA: Diagnosis not present

## 2015-12-18 MED FILL — LEVOCETIRIZINE 5 MG TABLET: 5 | 30 days supply | Qty: 30 | Fill #2 | Status: TO

## 2015-12-18 MED FILL — MONTELUKAST SOD 10 MG TAB: 10 | 30 days supply | Qty: 30 | Fill #2 | Status: TO

## 2015-12-25 DIAGNOSIS — J3089 Other allergic rhinitis: Secondary | ICD-10-CM | POA: Diagnosis not present

## 2015-12-25 DIAGNOSIS — J301 Allergic rhinitis due to pollen: Secondary | ICD-10-CM | POA: Diagnosis not present

## 2015-12-25 DIAGNOSIS — J3081 Allergic rhinitis due to animal (cat) (dog) hair and dander: Secondary | ICD-10-CM | POA: Diagnosis not present

## 2015-12-30 DIAGNOSIS — J3081 Allergic rhinitis due to animal (cat) (dog) hair and dander: Secondary | ICD-10-CM | POA: Diagnosis not present

## 2015-12-30 DIAGNOSIS — J3089 Other allergic rhinitis: Secondary | ICD-10-CM | POA: Diagnosis not present

## 2015-12-30 DIAGNOSIS — J301 Allergic rhinitis due to pollen: Secondary | ICD-10-CM | POA: Diagnosis not present

## 2016-01-02 DIAGNOSIS — M722 Plantar fascial fibromatosis: Secondary | ICD-10-CM | POA: Diagnosis not present

## 2016-01-02 DIAGNOSIS — M6702 Short Achilles tendon (acquired), left ankle: Secondary | ICD-10-CM | POA: Diagnosis not present

## 2016-01-06 DIAGNOSIS — J301 Allergic rhinitis due to pollen: Secondary | ICD-10-CM | POA: Diagnosis not present

## 2016-01-06 DIAGNOSIS — J3089 Other allergic rhinitis: Secondary | ICD-10-CM | POA: Diagnosis not present

## 2016-01-06 DIAGNOSIS — J3081 Allergic rhinitis due to animal (cat) (dog) hair and dander: Secondary | ICD-10-CM | POA: Diagnosis not present

## 2016-01-07 ENCOUNTER — Other Ambulatory Visit: Payer: Self-pay | Admitting: Medical

## 2016-01-07 MED FILL — TOLTERODINE TARTRATE 2 MG T: 2 | 30 days supply | Qty: 60 | Fill #0 | Status: TO

## 2016-01-07 MED FILL — SERTRALINE HCL 100 MG TAB: 100 | 30 days supply | Qty: 30 | Fill #0 | Status: TO

## 2016-01-07 MED FILL — NALTREXONE 50 MG TABLET: 50 | 60 days supply | Qty: 30 | Fill #1

## 2016-01-07 MED FILL — FAMOTIDINE 40 MG TABLET: 40 | 30 days supply | Qty: 30 | Fill #0 | Status: TO

## 2016-01-07 NOTE — Telephone Encounter (Signed)
Is this okay to refill? 

## 2016-01-14 DIAGNOSIS — J301 Allergic rhinitis due to pollen: Secondary | ICD-10-CM | POA: Diagnosis not present

## 2016-01-14 DIAGNOSIS — J3089 Other allergic rhinitis: Secondary | ICD-10-CM | POA: Diagnosis not present

## 2016-01-14 DIAGNOSIS — J3081 Allergic rhinitis due to animal (cat) (dog) hair and dander: Secondary | ICD-10-CM | POA: Diagnosis not present

## 2016-01-16 MED FILL — MONTELUKAST SOD 10 MG TAB: 10 | 30 days supply | Qty: 30 | Fill #0

## 2016-01-16 MED FILL — LEVOCETIRIZINE 5 MG TABLET: 5 | 30 days supply | Qty: 30 | Fill #0

## 2016-01-16 MED FILL — AZELASTINE 0.1% (137 MCG) S: 0.1 | 30 days supply | Qty: 30 | Fill #1

## 2016-01-16 MED FILL — FLUTICASONE PROP 50 MCG SPR: 50 | 30 days supply | Qty: 16 | Fill #1

## 2016-01-24 DIAGNOSIS — J3089 Other allergic rhinitis: Secondary | ICD-10-CM | POA: Diagnosis not present

## 2016-01-24 DIAGNOSIS — J301 Allergic rhinitis due to pollen: Secondary | ICD-10-CM | POA: Diagnosis not present

## 2016-01-24 DIAGNOSIS — J3081 Allergic rhinitis due to animal (cat) (dog) hair and dander: Secondary | ICD-10-CM | POA: Diagnosis not present

## 2016-01-28 DIAGNOSIS — J3081 Allergic rhinitis due to animal (cat) (dog) hair and dander: Secondary | ICD-10-CM | POA: Diagnosis not present

## 2016-01-28 DIAGNOSIS — J301 Allergic rhinitis due to pollen: Secondary | ICD-10-CM | POA: Diagnosis not present

## 2016-01-28 DIAGNOSIS — J3089 Other allergic rhinitis: Secondary | ICD-10-CM | POA: Diagnosis not present

## 2016-01-31 MED FILL — SERTRALINE HCL 100 MG TAB: 100 | 30 days supply | Qty: 30 | Fill #0

## 2016-02-06 DIAGNOSIS — J3089 Other allergic rhinitis: Secondary | ICD-10-CM | POA: Diagnosis not present

## 2016-02-06 DIAGNOSIS — J3081 Allergic rhinitis due to animal (cat) (dog) hair and dander: Secondary | ICD-10-CM | POA: Diagnosis not present

## 2016-02-06 DIAGNOSIS — J301 Allergic rhinitis due to pollen: Secondary | ICD-10-CM | POA: Diagnosis not present

## 2016-02-12 DIAGNOSIS — J3081 Allergic rhinitis due to animal (cat) (dog) hair and dander: Secondary | ICD-10-CM | POA: Diagnosis not present

## 2016-02-12 DIAGNOSIS — J3089 Other allergic rhinitis: Secondary | ICD-10-CM | POA: Diagnosis not present

## 2016-02-12 DIAGNOSIS — J301 Allergic rhinitis due to pollen: Secondary | ICD-10-CM | POA: Diagnosis not present

## 2016-02-13 MED FILL — FAMOTIDINE 40 MG TABLET: 40 | 30 days supply | Qty: 30 | Fill #0

## 2016-02-17 ENCOUNTER — Ambulatory Visit (INDEPENDENT_AMBULATORY_CARE_PROVIDER_SITE_OTHER): Payer: 59 | Admitting: Medical

## 2016-02-17 ENCOUNTER — Encounter: Payer: Self-pay | Admitting: Medical

## 2016-02-17 VITALS — BP 122/72 | HR 88 | Ht 69.75 in | Wt 225.0 lb

## 2016-02-17 DIAGNOSIS — D259 Leiomyoma of uterus, unspecified: Secondary | ICD-10-CM

## 2016-02-17 DIAGNOSIS — M51379 Other intervertebral disc degeneration, lumbosacral region without mention of lumbar back pain or lower extremity pain: Secondary | ICD-10-CM

## 2016-02-17 DIAGNOSIS — N3281 Overactive bladder: Secondary | ICD-10-CM

## 2016-02-17 DIAGNOSIS — R300 Dysuria: Secondary | ICD-10-CM

## 2016-02-17 DIAGNOSIS — M5137 Other intervertebral disc degeneration, lumbosacral region: Secondary | ICD-10-CM

## 2016-02-17 DIAGNOSIS — E785 Hyperlipidemia, unspecified: Secondary | ICD-10-CM

## 2016-02-17 DIAGNOSIS — D509 Iron deficiency anemia, unspecified: Secondary | ICD-10-CM | POA: Diagnosis not present

## 2016-02-17 DIAGNOSIS — N879 Dysplasia of cervix uteri, unspecified: Secondary | ICD-10-CM

## 2016-02-17 DIAGNOSIS — Z0000 Encounter for general adult medical examination without abnormal findings: Secondary | ICD-10-CM | POA: Diagnosis not present

## 2016-02-17 DIAGNOSIS — K760 Fatty (change of) liver, not elsewhere classified: Secondary | ICD-10-CM | POA: Diagnosis not present

## 2016-02-17 DIAGNOSIS — J4530 Mild persistent asthma, uncomplicated: Secondary | ICD-10-CM | POA: Diagnosis not present

## 2016-02-17 DIAGNOSIS — Z23 Encounter for immunization: Secondary | ICD-10-CM | POA: Diagnosis not present

## 2016-02-17 DIAGNOSIS — J309 Allergic rhinitis, unspecified: Secondary | ICD-10-CM

## 2016-02-17 DIAGNOSIS — K219 Gastro-esophageal reflux disease without esophagitis: Secondary | ICD-10-CM | POA: Diagnosis not present

## 2016-02-17 DIAGNOSIS — F3281 Premenstrual dysphoric disorder: Secondary | ICD-10-CM

## 2016-02-17 DIAGNOSIS — R7301 Impaired fasting glucose: Secondary | ICD-10-CM

## 2016-02-17 DIAGNOSIS — E559 Vitamin D deficiency, unspecified: Secondary | ICD-10-CM | POA: Diagnosis not present

## 2016-02-17 DIAGNOSIS — E669 Obesity, unspecified: Secondary | ICD-10-CM

## 2016-02-17 DIAGNOSIS — F1010 Alcohol abuse, uncomplicated: Secondary | ICD-10-CM

## 2016-02-17 LAB — CBC
HCT: 43.3 % (ref 35.0–45.0)
Hemoglobin: 14.8 g/dL (ref 11.7–15.5)
MCH: 30.1 pg (ref 27.0–33.0)
MCHC: 34.2 g/dL (ref 32.0–36.0)
MCV: 88.2 fL (ref 80.0–100.0)
MPV: 9.1 fL (ref 7.5–12.5)
PLATELETS: 222 10*3/uL (ref 140–400)
RBC: 4.91 MIL/uL (ref 3.80–5.10)
RDW: 13.5 % (ref 11.0–15.0)
WBC: 6 10*3/uL (ref 4.0–10.5)

## 2016-02-17 LAB — POCT URINALYSIS DIPSTICK
Bilirubin, UA: NEGATIVE
Blood, UA: NEGATIVE
GLUCOSE UA: NEGATIVE
Ketones, UA: NEGATIVE
Nitrite, UA: NEGATIVE
PROTEIN UA: NEGATIVE
Spec Grav, UA: >=1.03
UROBILINOGEN UA: NEGATIVE
pH, UA: 6

## 2016-02-17 LAB — TSH: TSH: 2.63 m[IU]/L

## 2016-02-17 MED ORDER — NALTREXONE-BUPROPION HCL ER 8-90 MG PO TB12: ORAL_TABLET | ORAL | 1 refills | Status: DC

## 2016-02-17 MED ORDER — FAMOTIDINE 40 MG PO TABS: 40.0000 mg | ORAL_TABLET | Freq: Every day | ORAL | 3 refills | Status: DC

## 2016-02-17 MED ORDER — SERTRALINE HCL 100 MG PO TABS: 100.0000 mg | ORAL_TABLET | Freq: Every day | ORAL | 3 refills | Status: DC

## 2016-02-17 MED FILL — LEVOCETIRIZINE 5 MG TABLET: 5 | 30 days supply | Qty: 30 | Fill #1

## 2016-02-17 NOTE — Addendum Note (Signed)
Addended by: Minette Headland A on: 02/17/2016 09:16 AM   Modules accepted: Orders

## 2016-02-17 NOTE — Progress Notes (Signed)
Subjective:   HPI  Michelle Griffin is a 45 y.o. female who presents for a complete physical.  Medical care team includes:  [facility] OB/Gynecology  [name], Allergist  Dr. Jolayne Panther, Dentist  [name] - Back specialist/Orthopedist  [name], GI  Dorothea Ogle, PA-C here for primary care   Concerns: Possible UTI.  Recent urinary frequency, urine odor, urine cloudy, some urgency.  No fever, no blood in urine, no vaginal c/o.    Had last UTI months ago.  Reviewed their medical, surgical, family, social, medication, and allergy history and updated chart as appropriate.  Past Medical History:  Diagnosis Date  . Allergy    sees [name]  . Anemia    hx/o iron deficiency due to heavy periods  . Asthma   . DDD (degenerative disc disease), lumbar   . Fatty liver   . GERD (gastroesophageal reflux disease)    [name]  . H/O mammogram 03/2013  . Hiatal hernia    small per EGD, [name]  . Hyperlipidemia 2012   was on pravastatin prior  . Migraine    prior eval with neurologist, Dr. Melton Alar  . OAB (overactive bladder)   . Overactive bladder   . PMDD (premenstrual dysphoric disorder)   . Pyelonephritis    age 78yo  . Routine gynecological examination    [place], [name], NP  . Vitamin D deficiency   . Wears glasses     Past Surgical History:  Procedure Laterality Date  . CHOLECYSTECTOMY    . COLONOSCOPY  2014   repeat 5 years; [name], baseline  . ESOPHAGOGASTRODUODENOSCOPY  2014   [name]  . FOOT SURGERY Left    morton's neuroma  . LASIK     [name]  . LUMBAR DISC SURGERY  07/2013   due to herniated disc, [name]  . NOVASURE ABLATION    . WISDOM TOOTH EXTRACTION      Social History   Social History  . Marital status: Married    Spouse name: N/A  . Number of children: N/A  . Years of education: N/A   Occupational History  . Not on file.   Social History Main Topics  . Smoking status: Never Smoker  . Smokeless  tobacco: Not on file  . Alcohol use 8.4 oz/week    14 Cans of beer per week     Comment: socially  . Drug use: No  . Sexual activity: Not on file   Other Topics Concern  . 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    Family History  Problem Relation Age of Onset  . Lupus Mother   . Hypertension Mother   . Hyperlipidemia Mother   . Migraines Mother   . Hypertension Father   . Diabetes Father   . Hyperlipidemia Father   . Obesity Sister   . Hypertension Sister   . Diabetes Sister   . Heart disease Paternal Uncle     CABG  . Hyperlipidemia Paternal Uncle   . Stroke Neg Hx   . Cancer Other     colon     Current Outpatient Prescriptions:  .  albuterol (PROVENTIL HFA;VENTOLIN HFA) 108 (90 BASE) MCG/ACT inhaler, Inhale 2 puffs into the lungs every 6 (six) hours as needed for wheezing or shortness of breath., Disp:  1 Inhaler, Rfl: 0 .  Biotin 10 MG CAPS, Take by mouth., Disp: , Rfl:  .  EPINEPHrine (EPI-PEN) 0.3 mg/0.3 mL SOAJ injection, Inject 0.3 mg into the muscle once., Disp: , Rfl:  .  famotidine (PEPCID) 40 MG tablet, TAKE 1 TABLET BY MOUTH DAILY., Disp: 30 tablet, Rfl: 1 .  fluticasone (FLONASE) 50 MCG/ACT nasal spray, Place into both nostrils daily., Disp: , Rfl:  .  ipratropium (ATROVENT) 0.06 % nasal spray, Place 2 sprays into both nostrils 4 (four) times daily., Disp: 15 mL, Rfl: 1 .  levocetirizine (XYZAL) 5 MG tablet, Take 5 mg by mouth every evening., Disp: , Rfl:  .  loratadine (CLARITIN) 10 MG tablet, Take 10 mg by mouth daily., Disp: , Rfl:  .  montelukast (SINGULAIR) 10 MG tablet, Take 10 mg by mouth at bedtime., Disp: , Rfl:  .  naltrexone (DEPADE) 50 MG tablet, TAKE 1/2 TABLET BY MOUTH ONCE DAILY, Disp: 30 tablet, Rfl: PRN .  sertraline (ZOLOFT) 100 MG tablet, Take 1 tablet (100 mg total) by mouth daily.,  Disp: 90 tablet, Rfl: 1 .  tolterodine (DETROL) 2 MG tablet, Take 2 mg by mouth at bedtime. , Disp: , Rfl:   Allergies  Allergen Reactions  . Sulfa Antibiotics Hives    Review of Systems Constitutional: -fever, -chills, -sweats, -unexpected weight change, -decreased appetite, -fatigue Allergy: -sneezing, -itching, -congestion Dermatology: -changing moles, -rash, -lumps ENT: -runny nose, -ear pain, -sore throat, -hoarseness, -sinus pain, -teeth pain, - ringing in ears, -hearing loss, -nosebleeds Cardiology: -chest pain, -palpitations, -swelling, -difficulty breathing when lying flat, -waking up short of breath Respiratory: -cough, -shortness of breath, -difficulty breathing with exercise or exertion, -wheezing, -coughing up blood Gastroenterology: -abdominal pain, -nausea, -vomiting, -diarrhea, -constipation, -blood in stool, -changes in bowel movement, -difficulty swallowing or eating Hematology: -bleeding, -bruising  Musculoskeletal: -joint aches, -muscle aches, -joint swelling, -back pain, -neck pain, -cramping, -changes in gait Ophthalmology: denies vision changes, eye redness, itching, discharge Urology: -burning with urination, -difficulty urinating, -blood in urine, +urinary frequency, +urgency, -incontinence Neurology: -headache, -weakness, -tingling, -numbness, -memory loss, -falls, -dizziness Psychology: -depressed mood, -agitation, -sleep problems     Objective:   Physical Exam  BP 122/72   Pulse 88   Ht 5' 9.75" (1.772 m)   Wt 225 lb (102.1 kg)   BMI 32.52 kg/m   General appearance: alert, no distress, WD/WN, white female Skin: scattered benign appearing macules, left forearm posteriorly with 2 prior shave biopsy scars, right forearm posteriorly with linear burn scar, no worrisome lesions HEENT: normocephalic, conjunctiva/corneas normal, sclerae anicteric, PERRLA, EOMi, nares patent, no discharge or erythema, pharynx normal Oral cavity: MMM, tongue normal, teeth in  good repair Neck: supple, no lymphadenopathy, no thyromegaly, no masses, normal ROM, no bruits Chest: non tender, normal shape and expansion Heart: RRR, normal S1, S2, no murmurs Lungs: CTA bilaterally, no wheezes, rhonchi, or rales Abdomen: +bs, soft, few small RUQ surgical scars, non tender, non distended, no masses, no hepatomegaly, no splenomegaly, no bruits Back: non tender, lumbar surgical scar Musculoskeletal: upper extremities non tender, no obvious deformity, normal ROM throughout, lower extremities non tender, no obvious deformity, normal ROM throughout Extremities: no edema, no cyanosis, no clubbing Pulses: 2+ symmetric, upper and lower extremities, normal cap refill Neurological: alert, oriented x 3, CN2-12 intact, strength normal upper extremities and lower extremities, sensation normal throughout, DTRs 2+ throughout, no cerebellar signs, gait normal Psychiatric: normal affect, behavior normal, pleasant  Breast/gyn/rectal - deferred to gynecology   Assessment and Plan :  Encounter Diagnoses  Name Primary?  . Encounter for health maintenance examination in adult Yes  . Allergic rhinitis, unspecified allergic rhinitis type   . Asthma, mild persistent, uncomplicated   . Fatty liver   . Gastroesophageal reflux disease without esophagitis   . Impaired fasting blood sugar   . DEGENERATIVE DISC DISEASE, LUMBAR SPINE   . OAB (overactive bladder)   . Cervical dysplasia   . Uterine leiomyoma, unspecified location   . Vitamin D deficiency   . PMDD (premenstrual dysphoric disorder)   . Hyperlipidemia   . Anemia, iron deficiency   . Excessive drinking alcohol   . Need for prophylactic vaccination against Streptococcus pneumoniae (pneumococcus)     Physical exam - discussed healthy lifestyle, diet, exercise, preventative care, vaccinations, and addressed their concerns.  PMDD - c/t Zoloft, doing fine on this Anemia history - labs today Vit D deficiency - counseled on dietary  and supplant intake, labs today OAB - on detrol per gyn Obesity - work on healthy lifestyle,work on weight loss, f/u pending labs Glad to hear she has cut back on alcohol.   Using naltrexone regularly.  Change to Contrave for weight loss and reducing alcohol cravings See your eye doctor yearly for routine vision care. See your dentist yearly for routine dental care including hygiene visits twice yearly. See your gynecologist yearly for routine gynecological care. See your allergist regularly Counseled on the pneumococcal vaccine.  Vaccine information sheet given.  Pneumococcal vaccine PPSV23 given after consent obtained. Follow-up pending labs.  Ralphine was seen today for cpe..  Diagnoses and all orders for this visit:  Encounter for health maintenance examination in adult -     CBC -     Comprehensive metabolic panel -     Lipid panel -     VITAMIN D 25 Hydroxy (Vit-D Deficiency, Fractures) -     Hemoglobin A1c -     TSH  Allergic rhinitis, unspecified allergic rhinitis type  Asthma, mild persistent, uncomplicated  Fatty liver  Gastroesophageal reflux disease without esophagitis  Impaired fasting blood sugar -     Hemoglobin A1c  DEGENERATIVE DISC DISEASE, LUMBAR SPINE  OAB (overactive bladder)  Cervical dysplasia  Uterine leiomyoma, unspecified location  Vitamin D deficiency -     VITAMIN D 25 Hydroxy (Vit-D Deficiency, Fractures)  PMDD (premenstrual dysphoric disorder)  Hyperlipidemia -     Lipid panel  Anemia, iron deficiency -     CBC  Excessive drinking alcohol  Need for prophylactic vaccination against Streptococcus pneumoniae (pneumococcus)  Obesity  Dysuria -     Urine culture  Other orders -     sertraline (ZOLOFT) 100 MG tablet; Take 1 tablet (100 mg total) by mouth daily. -     famotidine (PEPCID) 40 MG tablet; Take 1 tablet (40 mg total) by mouth daily. -     Naltrexone-Bupropion HCl ER 8-90 MG TB12; 1 tablet po x 5 days, then 1 tablet BID  for 5 days, then 2 tablets in the morning, 1 in evening x 5 days, then 2 tablets BID

## 2016-02-17 NOTE — Addendum Note (Signed)
Addended by: Minette Headland A on: 02/17/2016 09:14 AM   Modules accepted: Orders

## 2016-02-18 ENCOUNTER — Other Ambulatory Visit: Payer: Self-pay | Admitting: Medical

## 2016-02-18 LAB — COMPREHENSIVE METABOLIC PANEL
ALBUMIN: 4.3 g/dL (ref 3.6–5.1)
ALT: 19 U/L (ref 6–29)
AST: 17 U/L (ref 10–30)
Alkaline Phosphatase: 81 U/L (ref 33–115)
BILIRUBIN TOTAL: 0.5 mg/dL (ref 0.2–1.2)
BUN: 12 mg/dL (ref 7–25)
CALCIUM: 9.4 mg/dL (ref 8.6–10.2)
CO2: 24 mmol/L (ref 20–31)
Chloride: 104 mmol/L (ref 98–110)
Creat: 0.72 mg/dL (ref 0.50–1.10)
Glucose, Bld: 92 mg/dL (ref 65–99)
POTASSIUM: 4 mmol/L (ref 3.5–5.3)
SODIUM: 136 mmol/L (ref 135–146)
Total Protein: 7.3 g/dL (ref 6.1–8.1)

## 2016-02-18 LAB — LIPID PANEL
CHOL/HDL RATIO: 2.5 ratio (ref <=5.0)
Cholesterol: 198 mg/dL (ref 125–200)
HDL: 78 mg/dL (ref >=46)
LDL CALC: 96 mg/dL (ref <130)
TRIGLYCERIDES: 118 mg/dL (ref <150)
VLDL: 24 mg/dL (ref <30)

## 2016-02-18 LAB — HEMOGLOBIN A1C
Hgb A1c MFr Bld: 5.4 % (ref <5.7)
Mean Plasma Glucose: 108 mg/dL

## 2016-02-18 LAB — VITAMIN D 25 HYDROXY (VIT D DEFICIENCY, FRACTURES): Vit D, 25-Hydroxy: 23 ng/mL — ABNORMAL LOW (ref 30–100)

## 2016-02-18 LAB — URINE CULTURE: ORGANISM ID, BACTERIA: NO GROWTH

## 2016-02-18 MED ORDER — VITAMIN D (ERGOCALCIFEROL) 1.25 MG (50000 UNIT) PO CAPS: 50000.0000 [IU] | ORAL_CAPSULE | ORAL | 3 refills | Status: DC

## 2016-02-18 MED FILL — VIT D2 1.25 MG (50,000 UNIT: 1.25 MG | 84 days supply | Qty: 12 | Fill #0

## 2016-02-19 DIAGNOSIS — J301 Allergic rhinitis due to pollen: Secondary | ICD-10-CM | POA: Diagnosis not present

## 2016-02-19 DIAGNOSIS — J3081 Allergic rhinitis due to animal (cat) (dog) hair and dander: Secondary | ICD-10-CM | POA: Diagnosis not present

## 2016-02-19 DIAGNOSIS — J3089 Other allergic rhinitis: Secondary | ICD-10-CM | POA: Diagnosis not present

## 2016-02-22 ENCOUNTER — Telehealth: Payer: Self-pay | Admitting: Medical

## 2016-02-22 NOTE — Telephone Encounter (Signed)
P.A. CONTRAVE  

## 2016-02-24 DIAGNOSIS — J301 Allergic rhinitis due to pollen: Secondary | ICD-10-CM | POA: Diagnosis not present

## 2016-02-24 DIAGNOSIS — J3081 Allergic rhinitis due to animal (cat) (dog) hair and dander: Secondary | ICD-10-CM | POA: Diagnosis not present

## 2016-02-24 DIAGNOSIS — J3089 Other allergic rhinitis: Secondary | ICD-10-CM | POA: Diagnosis not present

## 2016-02-29 NOTE — Telephone Encounter (Signed)
Recv'd denial stating incorrect information, so resubmitted P.A.

## 2016-03-02 MED FILL — SERTRALINE HCL 100 MG TAB: 100 | 90 days supply | Qty: 90 | Fill #0

## 2016-03-02 MED FILL — CONTRAVE ER 8-90 MG TABLET: 8-90 | 30 days supply | Qty: 120 | Fill #0

## 2016-03-02 MED FILL — MONTELUKAST SOD 10 MG TAB: 10 | 30 days supply | Qty: 30 | Fill #1

## 2016-03-03 DIAGNOSIS — J3081 Allergic rhinitis due to animal (cat) (dog) hair and dander: Secondary | ICD-10-CM | POA: Diagnosis not present

## 2016-03-03 DIAGNOSIS — J301 Allergic rhinitis due to pollen: Secondary | ICD-10-CM | POA: Diagnosis not present

## 2016-03-03 DIAGNOSIS — J3089 Other allergic rhinitis: Secondary | ICD-10-CM | POA: Diagnosis not present

## 2016-03-07 NOTE — Telephone Encounter (Signed)
P.A. Approved til 06/30/16, left message for pt

## 2016-03-10 DIAGNOSIS — J3089 Other allergic rhinitis: Secondary | ICD-10-CM | POA: Diagnosis not present

## 2016-03-10 DIAGNOSIS — J301 Allergic rhinitis due to pollen: Secondary | ICD-10-CM | POA: Diagnosis not present

## 2016-03-10 DIAGNOSIS — J3081 Allergic rhinitis due to animal (cat) (dog) hair and dander: Secondary | ICD-10-CM | POA: Diagnosis not present

## 2016-03-17 DIAGNOSIS — J3081 Allergic rhinitis due to animal (cat) (dog) hair and dander: Secondary | ICD-10-CM | POA: Diagnosis not present

## 2016-03-17 DIAGNOSIS — J3089 Other allergic rhinitis: Secondary | ICD-10-CM | POA: Diagnosis not present

## 2016-03-17 DIAGNOSIS — J301 Allergic rhinitis due to pollen: Secondary | ICD-10-CM | POA: Diagnosis not present

## 2016-03-20 MED FILL — FAMOTIDINE 40 MG TABLET: 40 | 90 days supply | Qty: 90 | Fill #0

## 2016-03-20 MED FILL — LEVOCETIRIZINE 5 MG TABLET: 5 | 30 days supply | Qty: 30 | Fill #2

## 2016-03-24 DIAGNOSIS — J3081 Allergic rhinitis due to animal (cat) (dog) hair and dander: Secondary | ICD-10-CM | POA: Diagnosis not present

## 2016-03-24 DIAGNOSIS — J301 Allergic rhinitis due to pollen: Secondary | ICD-10-CM | POA: Diagnosis not present

## 2016-03-24 DIAGNOSIS — J3089 Other allergic rhinitis: Secondary | ICD-10-CM | POA: Diagnosis not present

## 2016-03-30 MED FILL — MONTELUKAST SOD 10 MG TAB: 10 | 30 days supply | Qty: 30 | Fill #2

## 2016-03-30 MED FILL — FLUTICASONE PROP 50 MCG SPR: 50 | 30 days supply | Qty: 16 | Fill #2

## 2016-03-31 DIAGNOSIS — J3081 Allergic rhinitis due to animal (cat) (dog) hair and dander: Secondary | ICD-10-CM | POA: Diagnosis not present

## 2016-03-31 DIAGNOSIS — J301 Allergic rhinitis due to pollen: Secondary | ICD-10-CM | POA: Diagnosis not present

## 2016-03-31 DIAGNOSIS — J3089 Other allergic rhinitis: Secondary | ICD-10-CM | POA: Diagnosis not present

## 2016-04-07 ENCOUNTER — Encounter: Payer: Self-pay | Admitting: Medical

## 2016-04-07 ENCOUNTER — Ambulatory Visit (INDEPENDENT_AMBULATORY_CARE_PROVIDER_SITE_OTHER): Payer: 59 | Admitting: Medical

## 2016-04-07 VITALS — BP 110/70 | HR 89 | Wt 223.4 lb

## 2016-04-07 DIAGNOSIS — E669 Obesity, unspecified: Secondary | ICD-10-CM | POA: Diagnosis not present

## 2016-04-07 DIAGNOSIS — K219 Gastro-esophageal reflux disease without esophagitis: Secondary | ICD-10-CM

## 2016-04-07 DIAGNOSIS — R7301 Impaired fasting glucose: Secondary | ICD-10-CM

## 2016-04-07 DIAGNOSIS — J3089 Other allergic rhinitis: Secondary | ICD-10-CM | POA: Diagnosis not present

## 2016-04-07 DIAGNOSIS — K760 Fatty (change of) liver, not elsewhere classified: Secondary | ICD-10-CM | POA: Diagnosis not present

## 2016-04-07 DIAGNOSIS — M5137 Other intervertebral disc degeneration, lumbosacral region: Secondary | ICD-10-CM | POA: Diagnosis not present

## 2016-04-07 DIAGNOSIS — J3081 Allergic rhinitis due to animal (cat) (dog) hair and dander: Secondary | ICD-10-CM | POA: Diagnosis not present

## 2016-04-07 DIAGNOSIS — J301 Allergic rhinitis due to pollen: Secondary | ICD-10-CM | POA: Diagnosis not present

## 2016-04-07 MED ORDER — NALTREXONE-BUPROPION HCL ER 8-90 MG PO TB12: 2.0000 | ORAL_TABLET | Freq: Two times a day (BID) | ORAL | 2 refills | Status: DC

## 2016-04-07 MED FILL — CONTRAVE ER 8-90 MG TABLET: 8-90 | 30 days supply | Qty: 120 | Fill #0

## 2016-04-07 NOTE — Progress Notes (Signed)
Subjective: Chief Complaint  Patient presents with  . follow up    follow up from new meds    Here for f/u on weight loss and beginning Contrave last visit.   She has been on Contrave about a month now.   Sees decreased appetite and cravings, not having any side effects.  Is using 2 tablets in the morning, 1 tablet at night of Contrave.   Has lost a pants size.    No chest pain, insomnia, pareshtieas, SOB. Gave up her morning bo jangles, eating boiled egg and protein bar most days.  Drinks water and diet soda.   No other aggravating or relieving factors. No other complaint.  Past Medical History:  Diagnosis Date  . Allergy    sees [name]  . Anemia    hx/o iron deficiency due to heavy periods  . Asthma   . DDD (degenerative disc disease), lumbar   . Fatty liver   . GERD (gastroesophageal reflux disease)    [name]  . H/O mammogram   . Hiatal hernia    small per EGD, [name]  . Hyperlipidemia 2012   was on pravastatin prior  . Migraine    prior eval with neurologist, Dr. Melton Alar  . Overactive bladder   . PMDD (premenstrual dysphoric disorder)   . Pyelonephritis    age 45yo  . Routine gynecological examination    [place]at[place], [name], NP  . Uterine infection 07/2015   s/p novasure ablation  . Vitamin D deficiency   . Wears glasses    Current Outpatient Prescriptions on File Prior to Visit  Medication Sig Dispense Refill  . albuterol (PROVENTIL HFA;VENTOLIN HFA) 108 (90 BASE) MCG/ACT inhaler Inhale 2 puffs into the lungs every 6 (six) hours as needed for wheezing or shortness of breath. 1 Inhaler 0  . Biotin 10 MG CAPS Take by mouth.    . EPINEPHrine (EPI-PEN) 0.3 mg/0.3 mL SOAJ injection Inject 0.3 mg into the muscle once.    . famotidine (PEPCID) 40 MG tablet Take 1 tablet (40 mg total) by mouth daily. 90 tablet 3  . fluticasone (FLONASE) 50 MCG/ACT nasal spray Place into both nostrils daily.    [name] ipratropium (ATROVENT) 0.06 % nasal spray Place 2 sprays  into both nostrils 4 (four) times daily. 15 mL 1  . levocetirizine (XYZAL) 5 MG tablet Take 5 mg by mouth every evening.    . loratadine (CLARITIN) 10 MG tablet Take 10 mg by mouth daily.    . montelukast (SINGULAIR) 10 MG tablet Take 10 mg by mouth at bedtime.    . sertraline (ZOLOFT) 100 MG tablet Take 1 tablet (100 mg total) by mouth daily. 90 tablet 3  . tolterodine (DETROL) 2 MG tablet Take 2 mg by mouth at bedtime.     . Vitamin D, Ergocalciferol, (DRISDOL) 50000 units CAPS capsule Take 1 capsule (50,000 Units total) by mouth every 7 (seven) days. 12 capsule 3   No current facility-administered medications on file prior to visit.    ROS as in subjective    Objective: BP 110/70   Pulse 89   Wt 223 lb 6.4 oz (101.3 kg)   SpO2 97%   BMI 32.28 kg/m   Wt Readings from Last 3 Encounters:  04/07/16 223 lb 6.4 oz (101.3 kg)  02/17/16 225 lb (102.1 kg)  07/29/15 234 lb 12.8 oz (106.5 kg)   Gen: wd, wn ,nad    Assessment Encounter Diagnoses  Name Primary?  [name]  Obesity with serious comorbidity, unspecified classification, unspecified obesity type Yes  . Fatty liver   . Gastroesophageal reflux disease without esophagitis   . Impaired fasting blood sugar   . DEGENERATIVE DISC DISEASE, LUMBAR SPINE     Plan: Seeing some improvement.   C/t Contrave, but go to 2 tablets BID, c/t exercise daily, work on additional diet modifications as we discussed, set goals, monitor weights at home.    Plan to return in 2 months for weight check or call back with weight readings.   Glad to hear she is seeing pant side lower and less cravings.    F/u 31mo.

## 2016-04-14 DIAGNOSIS — J301 Allergic rhinitis due to pollen: Secondary | ICD-10-CM | POA: Diagnosis not present

## 2016-04-14 DIAGNOSIS — J3081 Allergic rhinitis due to animal (cat) (dog) hair and dander: Secondary | ICD-10-CM | POA: Diagnosis not present

## 2016-04-14 DIAGNOSIS — J3089 Other allergic rhinitis: Secondary | ICD-10-CM | POA: Diagnosis not present

## 2016-04-21 DIAGNOSIS — J3081 Allergic rhinitis due to animal (cat) (dog) hair and dander: Secondary | ICD-10-CM | POA: Diagnosis not present

## 2016-04-21 DIAGNOSIS — J3089 Other allergic rhinitis: Secondary | ICD-10-CM | POA: Diagnosis not present

## 2016-04-21 DIAGNOSIS — J301 Allergic rhinitis due to pollen: Secondary | ICD-10-CM | POA: Diagnosis not present

## 2016-04-21 MED FILL — LEVOCETIRIZINE 5 MG TABLET: 5 | 30 days supply | Qty: 30 | Fill #3

## 2016-04-29 MED FILL — MONTELUKAST SOD 10 MG TAB: 10 | 30 days supply | Qty: 30 | Fill #3

## 2016-04-29 MED FILL — CONTRAVE ER 8-90 MG TABLET: 8-90 | 30 days supply | Qty: 120 | Fill #1

## 2016-04-29 MED FILL — VIT D2 1.25 MG (50,000 UNIT: 1.25 MG | 84 days supply | Qty: 12 | Fill #1

## 2016-04-29 MED FILL — TOLTERODINE TARTRATE 2 MG T: 2 | 30 days supply | Qty: 60 | Fill #0

## 2016-04-30 DIAGNOSIS — Z1389 Encounter for screening for other disorder: Secondary | ICD-10-CM | POA: Diagnosis not present

## 2016-04-30 DIAGNOSIS — J3081 Allergic rhinitis due to animal (cat) (dog) hair and dander: Secondary | ICD-10-CM | POA: Diagnosis not present

## 2016-04-30 DIAGNOSIS — J301 Allergic rhinitis due to pollen: Secondary | ICD-10-CM | POA: Diagnosis not present

## 2016-04-30 DIAGNOSIS — Z124 Encounter for screening for malignant neoplasm of cervix: Secondary | ICD-10-CM | POA: Diagnosis not present

## 2016-04-30 DIAGNOSIS — Z1231 Encounter for screening mammogram for malignant neoplasm of breast: Secondary | ICD-10-CM | POA: Diagnosis not present

## 2016-04-30 DIAGNOSIS — Z1151 Encounter for screening for human papillomavirus (HPV): Secondary | ICD-10-CM | POA: Diagnosis not present

## 2016-04-30 DIAGNOSIS — J3089 Other allergic rhinitis: Secondary | ICD-10-CM | POA: Diagnosis not present

## 2016-04-30 DIAGNOSIS — Z6831 Body mass index (BMI) 31.0-31.9, adult: Secondary | ICD-10-CM | POA: Diagnosis not present

## 2016-04-30 DIAGNOSIS — Z130 Encounter for screening for diseases of the blood and blood-forming organs and certain disorders involving the immune mechanism: Secondary | ICD-10-CM | POA: Diagnosis not present

## 2016-04-30 DIAGNOSIS — Z01419 Encounter for gynecological examination (general) (routine) without abnormal findings: Secondary | ICD-10-CM | POA: Diagnosis not present

## 2016-05-01 DIAGNOSIS — Z124 Encounter for screening for malignant neoplasm of cervix: Secondary | ICD-10-CM | POA: Diagnosis not present

## 2016-05-05 DIAGNOSIS — J3081 Allergic rhinitis due to animal (cat) (dog) hair and dander: Secondary | ICD-10-CM | POA: Diagnosis not present

## 2016-05-05 DIAGNOSIS — J301 Allergic rhinitis due to pollen: Secondary | ICD-10-CM | POA: Diagnosis not present

## 2016-05-05 DIAGNOSIS — J3089 Other allergic rhinitis: Secondary | ICD-10-CM | POA: Diagnosis not present

## 2016-05-06 ENCOUNTER — Other Ambulatory Visit: Payer: Self-pay | Admitting: Obstetrics and Gynecology

## 2016-05-06 DIAGNOSIS — R928 Other abnormal and inconclusive findings on diagnostic imaging of breast: Secondary | ICD-10-CM

## 2016-05-07 ENCOUNTER — Ambulatory Visit
Admission: RE | Admit: 2016-05-07 | Discharge: 2016-05-07 | Disposition: A | Discharge status: Home / Self Care | Payer: 59 | Source: Ambulatory Visit | Attending: Obstetrics and Gynecology | Admitting: Obstetrics and Gynecology

## 2016-05-07 DIAGNOSIS — R928 Other abnormal and inconclusive findings on diagnostic imaging of breast: Secondary | ICD-10-CM

## 2016-05-07 DIAGNOSIS — R921 Mammographic calcification found on diagnostic imaging of breast: Secondary | ICD-10-CM | POA: Diagnosis not present

## 2016-05-16 ENCOUNTER — Telehealth: Payer: 59 | Admitting: Physician Assistant

## 2016-05-16 DIAGNOSIS — H109 Unspecified conjunctivitis: Secondary | ICD-10-CM

## 2016-05-16 NOTE — Progress Notes (Signed)
Based on what you shared with me it looks like you have a serious condition that should be evaluated in a face to face office visit.  If you are currently on antibiotics without improvement in symptoms, you need a good physical examination to determine the cause of symptoms so the correct medication can be given.    NOTE: Even if you have entered your credit card information for this eVisit, you will not be charged.   If you are having a true medical emergency please call 911.  If you need an urgent face to face visit, [facility] has four urgent care centers for your convenience.  If you need care fast and have a high deductible or no insurance consider:   [email or web address]  [phone]  [address], [place], [place] [address] 8 am to 8 pm Monday-Friday 10 am to 4 pm Saturday-Sunday   The following sites will take your  insurance:    . [facility] [place] a Provider at this Location  [address] [place], [place] [address] . 10 am to 8 pm Monday-Friday . 12 pm to 8 pm Saturday-Sunday   . [facility] at [place] a Provider at this Location  [address] [place], [place], [place] [address] . 8 am to 8 pm Monday-Friday . 9 am to 6 pm Saturday . 11 am to 6 pm Sunday   . [facility] at [place] Get Driving Directions  [id number] [place], [place] [address] . 8 am to 8 pm Monday-Friday . 8 am to 4 pm Saturday-Sunday   . [facility] (walk-ins welcome, or call for a scheduled time)  [phone]  Get Driving Directions Find a Provider at this Location  [place], [place] [address] . 8 am to 8:30 pm Monday-Thursday . 8 am to 6 pm Friday . 8 am to 4 pm Saturday-Sunday   Your e-visit answers were reviewed by a board certified advanced  clinical practitioner to complete your personal care plan.  Thank you for using e-Visits.

## 2016-05-18 ENCOUNTER — Telehealth: Payer: Self-pay | Admitting: Medical

## 2016-05-18 ENCOUNTER — Other Ambulatory Visit: Payer: Self-pay | Admitting: Medical

## 2016-05-18 MED ORDER — ERYTHROMYCIN 5 MG/GM OP OINT: 1.0000 "application " | TOPICAL_OINTMENT | Freq: Four times a day (QID) | OPHTHALMIC | 0 refills | Status: DC

## 2016-05-18 NOTE — Telephone Encounter (Signed)
Med sent.

## 2016-05-18 NOTE — Telephone Encounter (Signed)
ALERT PT NEEDS ANY MED CALL INTO A DIFFERENT PHARMACY. Pt called and stated that she is stuck in [place] at the air[place] after a trip to [place]. She states that for over a week she has had issues with her eyes. She states that they are red, itchy and crusty. Mainly the issue is first thing in morning. Pt is requesting something be called in for her to [place]. PLEASE SEND TO [facility] ON [address]. Please advise me if something sent in as pt would like me to call her and advise. Pt can be reached on her cell phone.

## 2016-05-18 NOTE — Telephone Encounter (Signed)
Pt advised.

## 2016-05-19 ENCOUNTER — Encounter: Payer: Self-pay | Admitting: Medical

## 2016-05-19 ENCOUNTER — Ambulatory Visit: Payer: 59 | Admitting: Medical

## 2016-05-20 DIAGNOSIS — H10413 Chronic giant papillary conjunctivitis, bilateral: Secondary | ICD-10-CM | POA: Diagnosis not present

## 2016-05-20 MED FILL — LOTEMAX 0.5% GEL: 0.5 | 7 days supply | Qty: 5 | Fill #0

## 2016-05-26 DIAGNOSIS — J301 Allergic rhinitis due to pollen: Secondary | ICD-10-CM | POA: Diagnosis not present

## 2016-05-26 DIAGNOSIS — J3089 Other allergic rhinitis: Secondary | ICD-10-CM | POA: Diagnosis not present

## 2016-05-26 DIAGNOSIS — J3081 Allergic rhinitis due to animal (cat) (dog) hair and dander: Secondary | ICD-10-CM | POA: Diagnosis not present

## 2016-06-02 MED FILL — MONTELUKAST SOD 10 MG TAB: 10 | 30 days supply | Qty: 30 | Fill #0

## 2016-06-02 MED FILL — LEVOCETIRIZINE 5 MG TABLET: 5 | 30 days supply | Qty: 30 | Fill #0

## 2016-06-02 MED FILL — SERTRALINE HCL 100 MG TAB: 100 | 90 days supply | Qty: 90 | Fill #1

## 2016-06-08 MED FILL — TOLTERODINE TARTRATE 2 MG T: 2 | 30 days supply | Qty: 60 | Fill #0

## 2016-06-08 MED FILL — AZELASTINE HCL 0.05% DROPS: 0.05 | 30 days supply | Qty: 6 | Fill #0

## 2016-06-08 MED FILL — CONTRAVE ER 8-90 MG TABLET: 8-90 | 30 days supply | Qty: 120 | Fill #2

## 2016-06-08 MED FILL — FLUTICASONE PROP 50 MCG SPR: 50 | 30 days supply | Qty: 16 | Fill #3

## 2016-06-09 DIAGNOSIS — J3089 Other allergic rhinitis: Secondary | ICD-10-CM | POA: Diagnosis not present

## 2016-06-09 DIAGNOSIS — J3081 Allergic rhinitis due to animal (cat) (dog) hair and dander: Secondary | ICD-10-CM | POA: Diagnosis not present

## 2016-06-09 DIAGNOSIS — J301 Allergic rhinitis due to pollen: Secondary | ICD-10-CM | POA: Diagnosis not present

## 2016-06-12 ENCOUNTER — Telehealth: Payer: Self-pay | Admitting: Medical

## 2016-06-12 NOTE — Telephone Encounter (Signed)
I received a form for her to be cleared for exercise program.  Her chart history does show back pain, leg numbness.  Does she feel she has any limitation with regard to exercises, running, or other strenuous exercise?

## 2016-06-15 NOTE — Telephone Encounter (Signed)
Spoke with pt- she started exercise program on Friday and has no limitations to exercising. She would like [name] to fax form to number listed on form when it is complete. Michelle Griffin

## 2016-06-16 NOTE — Telephone Encounter (Signed)
done

## 2016-06-16 NOTE — Telephone Encounter (Signed)
Fax form back.

## 2016-06-23 DIAGNOSIS — J301 Allergic rhinitis due to pollen: Secondary | ICD-10-CM | POA: Diagnosis not present

## 2016-06-23 DIAGNOSIS — J3081 Allergic rhinitis due to animal (cat) (dog) hair and dander: Secondary | ICD-10-CM | POA: Diagnosis not present

## 2016-06-23 DIAGNOSIS — J3089 Other allergic rhinitis: Secondary | ICD-10-CM | POA: Diagnosis not present

## 2016-06-29 ENCOUNTER — Telehealth: Payer: Self-pay | Admitting: Medical

## 2016-06-29 NOTE — Telephone Encounter (Signed)
Insurance is denying coverage for Contrave.  Have her call insurance for alternate cheaper or covered options.

## 2016-06-30 DIAGNOSIS — J3089 Other allergic rhinitis: Secondary | ICD-10-CM | POA: Diagnosis not present

## 2016-06-30 DIAGNOSIS — J3081 Allergic rhinitis due to animal (cat) (dog) hair and dander: Secondary | ICD-10-CM | POA: Diagnosis not present

## 2016-06-30 DIAGNOSIS — J301 Allergic rhinitis due to pollen: Secondary | ICD-10-CM | POA: Diagnosis not present

## 2016-06-30 NOTE — Telephone Encounter (Signed)
Called and l/m for her to check with her insurance and call us back.

## 2016-07-01 DIAGNOSIS — H5201 Hypermetropia, right eye: Secondary | ICD-10-CM | POA: Diagnosis not present

## 2016-07-01 DIAGNOSIS — J301 Allergic rhinitis due to pollen: Secondary | ICD-10-CM | POA: Diagnosis not present

## 2016-07-01 DIAGNOSIS — H5212 Myopia, left eye: Secondary | ICD-10-CM | POA: Diagnosis not present

## 2016-07-01 DIAGNOSIS — J3081 Allergic rhinitis due to animal (cat) (dog) hair and dander: Secondary | ICD-10-CM | POA: Diagnosis not present

## 2016-07-01 DIAGNOSIS — J3089 Other allergic rhinitis: Secondary | ICD-10-CM | POA: Diagnosis not present

## 2016-07-01 MED FILL — LEVOCETIRIZINE 5 MG TABLET: 5 | 30 days supply | Qty: 30 | Fill #1

## 2016-07-01 MED FILL — MONTELUKAST SOD 10 MG TAB: 10 | 30 days supply | Qty: 30 | Fill #1

## 2016-07-01 MED FILL — FAMOTIDINE 40 MG TABLET: 40 | 90 days supply | Qty: 90 | Fill #1

## 2016-07-03 ENCOUNTER — Telehealth: Payer: Self-pay

## 2016-07-03 NOTE — Telephone Encounter (Signed)
Pt called back about her insurance didn't recommend anything else for the contrave, she said that insurance has faxed a form to feel out for this. Check with laura she said that we didn't get a form .

## 2016-07-06 ENCOUNTER — Other Ambulatory Visit: Payer: Self-pay | Admitting: Medical

## 2016-07-06 MED FILL — TOLTERODINE TARTRATE 2 MG T: 2 | 30 days supply | Qty: 60 | Fill #1

## 2016-07-06 MED FILL — FLUTICASONE PROP 50 MCG SPR: 50 | 30 days supply | Qty: 16 | Fill #4

## 2016-07-07 MED FILL — CONTRAVE ER 8-90 MG TABLET: 8-90 | 30 days supply | Qty: 120 | Fill #0

## 2016-07-08 ENCOUNTER — Ambulatory Visit (HOSPITAL_COMMUNITY)
Admission: EM | Admit: 2016-07-08 | Discharge: 2016-07-08 | Disposition: A | Discharge status: Home / Self Care | Payer: PRIVATE HEALTH INSURANCE | Attending: Family Medicine | Admitting: Family Medicine

## 2016-07-08 ENCOUNTER — Ambulatory Visit (INDEPENDENT_AMBULATORY_CARE_PROVIDER_SITE_OTHER): Payer: PRIVATE HEALTH INSURANCE

## 2016-07-08 ENCOUNTER — Encounter (HOSPITAL_COMMUNITY): Payer: Self-pay | Admitting: Family Medicine

## 2016-07-08 DIAGNOSIS — S6292XA Unspecified fracture of left wrist and hand, initial encounter for closed fracture: Secondary | ICD-10-CM

## 2016-07-08 MED ORDER — TRAMADOL HCL 50 MG PO TABS: 50.0000 mg | ORAL_TABLET | Freq: Four times a day (QID) | ORAL | 0 refills | Status: DC | PRN

## 2016-07-08 NOTE — ED Provider Notes (Addendum)
[facility]    CSN: [id number] Arrival date & time: 07/08/16  1704     History   Chief Complaint Chief Complaint  Patient presents with  . Hand Injury    HPI Michelle Griffin is a 46 y.o. female.   Patient reports hitting dorsal aspect of left hand on corner of a sharp table. States she is unable to flex or extend finger without severe pain. 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      Past Medical History:  Diagnosis Date  . Allergy    sees [name]  . Anemia    hx/o iron deficiency due to heavy periods  . Asthma   . DDD (degenerative disc disease), lumbar   . Fatty liver   . GERD (gastroesophageal reflux disease)    [name]  . H/O mammogram   . Hiatal hernia    small per EGD, [name]  . Hyperlipidemia 2012   was on pravastatin prior  . Migraine    prior eval with neurologist, Dr. Melton Alar  . Overactive bladder   . PMDD (premenstrual dysphoric disorder)   . Pyelonephritis    age 44yo  . Routine gynecological examination    [place], [name], NP  . Uterine infection 07/2015   s/p novasure ablation  . Vitamin D deficiency   . Wears glasses     Patient Active Problem List   Diagnosis Date Noted  . Encounter for health maintenance examination in adult 02/17/2016  . Need for prophylactic vaccination against Streptococcus pneumoniae (pneumococcus) 02/17/2016  . Obesity 02/17/2016  . Dysuria 02/17/2016  . Cervical dysplasia 07/29/2015  . Menorrhagia 07/29/2015  . Uterine fibroid 07/29/2015  . Fever postop 07/27/2015  . Fatty liver 02/13/2015  . Vitamin D deficiency 02/13/2015  . PMDD (premenstrual dysphoric disorder) 02/13/2015  . Hyperlipidemia 02/13/2015  . OAB (overactive bladder) 02/13/2015  . Gastroesophageal reflux disease without esophagitis 02/13/2015  . Anemia, iron deficiency 02/13/2015  . Rhinitis, allergic 02/13/2015  . Asthma, mild persistent 02/13/2015  . Routine general medical  examination at a health care facility 02/13/2015  . Impaired fasting blood sugar 02/13/2015  . Excessive drinking alcohol 02/13/2015  . DEGENERATIVE DISC DISEASE, LUMBAR SPINE 11/13/2008  . BACK PAIN WITH RADICULOPATHY 11/13/2008    Past Surgical History:  Procedure Laterality Date  . CHOLECYSTECTOMY    . COLONOSCOPY  2014   repeat 5 years; [name], baseline  . ESOPHAGOGASTRODUODENOSCOPY  2014   [name]  . FOOT SURGERY Left    morton's neuroma  . LASIK     [name]  . LUMBAR DISC SURGERY  07/2013   due to herniated disc, [name]  . NOVASURE ABLATION    . WISDOM TOOTH EXTRACTION      OB History    No data available       Home Medications    Prior to Admission medications   Medication Sig Start Date End Date Taking? Authorizing Provider  albuterol (PROVENTIL HFA;VENTOLIN HFA) 108 (90 BASE) MCG/ACT inhaler Inhale 2 puffs into the lungs every 6 (six) hours as needed for wheezing or shortness of breath. 08/09/13   Camelia Eng Tysinger, PA-C  Biotin 10 MG CAPS Take by mouth.    Historical Provider, MD  CONTRAVE 8-90 MG TB12 TAKE 2 TABLETS BY MOUTH 2 TIMES DAILY 07/07/16   Camelia Eng Tysinger, PA-C  EPINEPHrine (EPI-PEN) 0.3 mg/0.3 mL SOAJ injection Inject 0.3 mg into the muscle once.  Historical Provider, MD  erythromycin [facility]) ophthalmic ointment Place 1 application into both eyes 4 (four) times daily. 05/18/16   Camelia Eng Tysinger, PA-C  famotidine (PEPCID) 40 MG tablet Take 1 tablet (40 mg total) by mouth daily. 02/17/16   Camelia Eng Tysinger, PA-C  fluticasone (FLONASE) 50 MCG/ACT nasal spray Place into both nostrils daily.    Historical Provider, MD  ipratropium (ATROVENT) 0.06 % nasal spray Place 2 sprays into both nostrils 4 (four) times daily. 07/10/14   Billy Fischer, MD  levocetirizine (XYZAL) 5 MG tablet Take 5 mg by mouth every evening.    Historical Provider, MD  loratadine (CLARITIN) 10 MG tablet Take 10 mg by mouth daily.    Historical Provider, MD  montelukast  (SINGULAIR) 10 MG tablet Take 10 mg by mouth at bedtime.    Historical Provider, MD  sertraline (ZOLOFT) 100 MG tablet Take 1 tablet (100 mg total) by mouth daily. 02/17/16   Camelia Eng Tysinger, PA-C  tolterodine (DETROL) 2 MG tablet Take 2 mg by mouth at bedtime.     Historical Provider, MD  traMADol (ULTRAM) 50 MG tablet Take 1 tablet (50 mg total) by mouth every 6 (six) hours as needed. 07/08/16   Robyn Haber, MD  Vitamin D, Ergocalciferol, (DRISDOL) 50000 units CAPS capsule Take 1 capsule (50,000 Units total) by mouth every 7 (seven) days. 02/18/16   Carlena Hurl, PA-C    Family History Family History  Problem Relation Age of Onset  . Lupus Mother   . Hypertension Mother   . Hyperlipidemia Mother   . Migraines Mother   . Hypertension Father   . Diabetes Father   . Hyperlipidemia Father   . Obesity Sister   . Hypertension Sister   . Diabetes Sister   . Heart disease Paternal Uncle     CABG  . Hyperlipidemia Paternal Uncle   . Cancer Paternal Uncle     multiple with colon cancer  . Cancer Cousin     breast  . Cancer Cousin     ovarian  . Stroke Neg Hx     Social History Social History  Substance Use Topics  . Smoking status: Never Smoker  . Smokeless tobacco: Never Used  . Alcohol use 1.8 oz/week    3 Cans of beer per week     Comment: socially     Allergies   Sulfa antibiotics   Review of Systems Review of Systems  Constitutional: Negative.   Musculoskeletal: Positive for joint swelling.     Physical Exam Triage Vital Signs ED Triage Vitals  Enc Vitals Group     BP      Pulse      Resp      Temp      Temp src      SpO2      Weight      Height      Head Circumference      Peak Flow      Pain Score      Pain Loc      Pain Edu?      Excl. in [place]?    No data found.   Updated Vital Signs BP 118/71 (BP Location: Right Arm)   Pulse 98   Temp 98 F (36.7 C) (Oral)   Resp 17   SpO2 100%    Physical Exam  Constitutional: She is oriented  to person, place, and time. She appears well-developed and well-nourished.  HENT:  Head: Normocephalic.  Right Ear: External ear normal.  Left Ear: External ear normal.  Mouth/Throat: Oropharynx is clear and moist.  Eyes: Conjunctivae and EOM are normal.  Neck: Normal range of motion. Neck supple.  Pulmonary/Chest: Effort normal.  Musculoskeletal: She exhibits no deformity.  Very faint transverse fracture of the middle metacarpal on the left hand best seen on the PA view of the x-ray.  Neurological: She is alert and oriented to person, place, and time.  Skin: Skin is warm and dry.  Nursing note and vitals reviewed.    UC Treatments / Results  Labs (all labs ordered are listed, but only abnormal results are displayed) Labs Reviewed - No data to display  EKG  EKG Interpretation None       Radiology Dg Hand Complete Left  Result Date: 07/08/2016 CLINICAL DATA:  Injury with pain at the third metacarpal EXAM: LEFT HAND - COMPLETE 3+ VIEW COMPARISON:  None. FINDINGS: There is no evidence of fracture or dislocation. There is no evidence of arthropathy or other focal bone abnormality. Soft tissues are unremarkable. IMPRESSION: Negative. Electronically Signed   By: Donavan Foil M.D.   On: 07/08/2016 18:15    Procedures Procedures (including critical care time)  Medications Ordered in UC Medications - No data to display   Initial Impression / Assessment and Plan / UC Course  I have reviewed the triage vital signs and the nursing notes.  Pertinent labs & imaging results that were available during my care of the patient were reviewed by me and considered in my medical decision making (see chart for details).     Final Clinical Impressions(s) / UC Diagnoses   Final diagnoses:  Closed left hand fracture, initial encounter    New Prescriptions New Prescriptions   TRAMADOL (ULTRAM) 50 MG TABLET    Take 1 tablet (50 mg total) by mouth every 6 (six) hours as needed.  Ibuprofen  tonight. Splint applied. Follow-up with orthopedics   Robyn Haber, MD 07/08/16 1842    Robyn Haber, MD 07/08/16 [phone]

## 2016-07-08 NOTE — Progress Notes (Signed)
Orthopedic Tech Progress Note Patient Details:  Michelle Griffin [birth year] [place]:[id number]  Ortho Devices Type of Ortho Device: Ace wrap, Volar splint Ortho Device/Splint Location: LUE Ortho Device/Splint Interventions: Ordered, Application   Braulio Bosch 07/08/2016, 6:43 PM

## 2016-07-08 NOTE — ED Triage Notes (Addendum)
Patient reports hitting posterior aspect of left hand on corner of a sharp table. States she is unable to flex or extend finger without sever pain. Patient with numbness and tingling to pinky and ring finger.

## 2016-07-08 NOTE — ED Notes (Signed)
orhto tech notified

## 2016-07-08 NOTE — ED Notes (Signed)
Patient brought a "[facility] " form to this visit.  Attending completed form, returned completed form to patient and made a copy for scanning into medical record

## 2016-07-08 NOTE — ED Notes (Signed)
Paged ortho tech 

## 2016-07-08 NOTE — Discharge Instructions (Signed)
You have a very faint transverse metacarpal fracture of the middle finger which is nondisplaced and will require some ongoing monitoring for a month to insure proper healing.  Please call your orthopedist or Dr. Amedeo Plenty in the morning for follow-up.

## 2016-07-08 NOTE — ED Notes (Signed)
Patient requested non-narcotic pain medicine just in case she needed pain medicine

## 2016-07-14 DIAGNOSIS — J3081 Allergic rhinitis due to animal (cat) (dog) hair and dander: Secondary | ICD-10-CM | POA: Diagnosis not present

## 2016-07-14 DIAGNOSIS — J3089 Other allergic rhinitis: Secondary | ICD-10-CM | POA: Diagnosis not present

## 2016-07-14 DIAGNOSIS — J301 Allergic rhinitis due to pollen: Secondary | ICD-10-CM | POA: Diagnosis not present

## 2016-07-17 ENCOUNTER — Telehealth: Payer: Self-pay | Admitting: Medical

## 2016-07-18 NOTE — Telephone Encounter (Signed)
Pt called yesterday stating ins now requiring P.A. CONTRAVE.

## 2016-07-18 NOTE — Telephone Encounter (Signed)
Initiated P.A. CONTRAVE thru [employer]

## 2016-07-21 DIAGNOSIS — J301 Allergic rhinitis due to pollen: Secondary | ICD-10-CM | POA: Diagnosis not present

## 2016-07-21 DIAGNOSIS — J3081 Allergic rhinitis due to animal (cat) (dog) hair and dander: Secondary | ICD-10-CM | POA: Diagnosis not present

## 2016-07-21 DIAGNOSIS — J3089 Other allergic rhinitis: Secondary | ICD-10-CM | POA: Diagnosis not present

## 2016-07-23 NOTE — Telephone Encounter (Signed)
Recv'd call from [employer] t# [phone] PA# [id number] called back and had to leave a message.

## 2016-07-28 DIAGNOSIS — J301 Allergic rhinitis due to pollen: Secondary | ICD-10-CM | POA: Diagnosis not present

## 2016-07-28 DIAGNOSIS — J3089 Other allergic rhinitis: Secondary | ICD-10-CM | POA: Diagnosis not present

## 2016-07-28 DIAGNOSIS — J3081 Allergic rhinitis due to animal (cat) (dog) hair and dander: Secondary | ICD-10-CM | POA: Diagnosis not present

## 2016-07-28 MED FILL — VIT D2 1.25 MG (50,000 UNIT: 1.25 MG | 84 days supply | Qty: 12 | Fill #2

## 2016-07-28 MED FILL — MONTELUKAST SOD 10 MG TAB: 10 | 30 days supply | Qty: 30 | Fill #2

## 2016-07-28 MED FILL — LEVOCETIRIZINE 5 MG TABLET: 5 | 30 days supply | Qty: 30 | Fill #2

## 2016-07-30 NOTE — Telephone Encounter (Signed)
P.A approved, left message for pt, called pharmacy & too soon can't try to run til tomorrow

## 2016-08-06 DIAGNOSIS — J3081 Allergic rhinitis due to animal (cat) (dog) hair and dander: Secondary | ICD-10-CM | POA: Diagnosis not present

## 2016-08-06 DIAGNOSIS — J3089 Other allergic rhinitis: Secondary | ICD-10-CM | POA: Diagnosis not present

## 2016-08-06 DIAGNOSIS — J301 Allergic rhinitis due to pollen: Secondary | ICD-10-CM | POA: Diagnosis not present

## 2016-08-11 DIAGNOSIS — J3089 Other allergic rhinitis: Secondary | ICD-10-CM | POA: Diagnosis not present

## 2016-08-11 DIAGNOSIS — J301 Allergic rhinitis due to pollen: Secondary | ICD-10-CM | POA: Diagnosis not present

## 2016-08-11 DIAGNOSIS — J3081 Allergic rhinitis due to animal (cat) (dog) hair and dander: Secondary | ICD-10-CM | POA: Diagnosis not present

## 2016-08-17 MED FILL — CONTRAVE ER 8-90 MG TABLET: 8-90 | 30 days supply | Qty: 120 | Fill #1

## 2016-08-18 DIAGNOSIS — J3081 Allergic rhinitis due to animal (cat) (dog) hair and dander: Secondary | ICD-10-CM | POA: Diagnosis not present

## 2016-08-18 DIAGNOSIS — J301 Allergic rhinitis due to pollen: Secondary | ICD-10-CM | POA: Diagnosis not present

## 2016-08-18 DIAGNOSIS — J3089 Other allergic rhinitis: Secondary | ICD-10-CM | POA: Diagnosis not present

## 2016-08-25 DIAGNOSIS — J301 Allergic rhinitis due to pollen: Secondary | ICD-10-CM | POA: Diagnosis not present

## 2016-08-25 DIAGNOSIS — J3081 Allergic rhinitis due to animal (cat) (dog) hair and dander: Secondary | ICD-10-CM | POA: Diagnosis not present

## 2016-08-25 DIAGNOSIS — J3089 Other allergic rhinitis: Secondary | ICD-10-CM | POA: Diagnosis not present

## 2016-08-26 MED FILL — LEVOCETIRIZINE 5 MG TABLET: 5 | 30 days supply | Qty: 30 | Fill #0

## 2016-08-26 MED FILL — MONTELUKAST SOD 10 MG TAB: 10 | 30 days supply | Qty: 30 | Fill #0

## 2016-09-02 ENCOUNTER — Encounter (INDEPENDENT_AMBULATORY_CARE_PROVIDER_SITE_OTHER): Payer: Self-pay | Admitting: Orthopedic Surgery

## 2016-09-02 ENCOUNTER — Ambulatory Visit (INDEPENDENT_AMBULATORY_CARE_PROVIDER_SITE_OTHER): Payer: 59 | Admitting: Orthopedic Surgery

## 2016-09-02 VITALS — Ht 69.0 in | Wt 223.0 lb

## 2016-09-02 DIAGNOSIS — M6701 Short Achilles tendon (acquired), right ankle: Secondary | ICD-10-CM | POA: Diagnosis not present

## 2016-09-02 DIAGNOSIS — M7661 Achilles tendinitis, right leg: Secondary | ICD-10-CM | POA: Insufficient documentation

## 2016-09-02 DIAGNOSIS — M6702 Short Achilles tendon (acquired), left ankle: Secondary | ICD-10-CM

## 2016-09-02 DIAGNOSIS — M722 Plantar fascial fibromatosis: Secondary | ICD-10-CM | POA: Diagnosis not present

## 2016-09-02 MED ORDER — NITROGLYCERIN 0.2 MG/HR TD PT24: 0.2000 mg | MEDICATED_PATCH | Freq: Every day | TRANSDERMAL | 12 refills | Status: DC

## 2016-09-02 MED FILL — NITROGLYCERIN 0.2 MG/HR PTC: 0.2 | 30 days supply | Qty: 30 | Fill #0 | Status: TO

## 2016-09-02 NOTE — Progress Notes (Signed)
Office Visit Note   Patient: Michelle Griffin           Date of Birth: [birth year]           MRN: [id number] Visit Date: 09/02/2016              Requested by: Carlena Hurl, PA-C [address] [place], [place] [address] PCP: Crisoforo Oxford, PA-C  Chief Complaint  Patient presents with  . Left Foot - Pain  . Right Foot - Pain      HPI: Patient presents with heel cord contracture bilaterally left worse than right with persistent plantar fasciitis on the left and insertional Achilles tendinitis on the right with swelling. Patient complains of pain with activities of daily living she states she's been working on heel cord stretching.  Assessment & Plan: Visit Diagnoses:  1. Achilles tendinitis, right leg   2. Plantar fasciitis, left   3. Achilles tendon contracture, bilateral     Plan: Patient was given instructions to demonstrate heel cord stretching she is to do this 5 times a day minute of time. We will call in a prescription for nitroglycerin patch to use on the Achilles tendinitis on the right. She will change the patch location daily. Patient was given a prescription for ultrasound ionophoresis for the insertional Achilles tendinitis on the right. Recommended stiffer so old shoes with the over-the-counter orthotics to unload pressure off the plantar fascia. Follow-up as needed. Discussed that if she is not better in a month to call and we will set her up for an injection at the insertion of the Achilles retrocalcaneal bursa on the right.  Follow-Up Instructions: Return if symptoms worsen or fail to improve.   Ortho Exam  Patient is alert, oriented, no adenopathy, well-dressed, normal affect, normal respiratory effort. Examination patient has a normal gait. She has a good dorsalis pedis pulse bilaterally. Examination the left lower extremity she has dorsiflexion with her knee extended about 10 short of neutral with significant heel cord contracture she is tender to  palpation along the entire Achilles tendon lateral compression the calcaneus is nontender there are no nodular changes. Examination of right foot she does have swelling at the insertion of the right Achilles there is no redness no cellulitis no signs of infection. The Achilles tendon is intact she has dorsiflexion to neutral with her knee extended. She is exquisitely tender to palpation insertion of the Achilles on the right.  Imaging: No results found.  Labs: Lab Results  Component Value Date   HGBA1C 5.4 02/17/2016   HGBA1C 5.6 02/13/2015   HGBA1C 5.7 (H) 08/21/2014   REPTSTATUS 08/01/2015 FINAL 07/27/2015   CULT  07/27/2015    NO GROWTH 5 DAYS Performed at [facility] NO GROWTH 02/17/2016    Orders:  No orders of the defined types were placed in this encounter.  Meds ordered this encounter  Medications  . nitroGLYCERIN (NITRODUR - DOSED IN MG/24 HR) 0.2 mg/hr patch    Sig: Place 1 patch (0.2 mg total) onto the skin daily.    Dispense:  30 patch    Refill:  12     Procedures: No procedures performed  Clinical Data: No additional findings.  ROS:  All other systems negative, except as noted in the HPI. Review of Systems  Objective: Vital Signs: Ht 5\' 9"  (1.753 m)   Wt 223 lb (101.2 kg)   BMI 32.93 kg/m   Specialty Comments:  No specialty comments  available.  PMFS History: Patient Active Problem List   Diagnosis Date Noted  . Achilles tendinitis, right leg 09/02/2016  . Plantar fasciitis, left 09/02/2016  . Achilles tendon contracture, bilateral 09/02/2016  . Encounter for health maintenance examination in adult 02/17/2016  . Need for prophylactic vaccination against Streptococcus pneumoniae (pneumococcus) 02/17/2016  . Obesity 02/17/2016  . Dysuria 02/17/2016  . Cervical dysplasia 07/29/2015  . Menorrhagia 07/29/2015  . Uterine fibroid 07/29/2015  . Fever postop 07/27/2015  . Fatty liver 02/13/2015  . Vitamin D deficiency 02/13/2015    . PMDD (premenstrual dysphoric disorder) 02/13/2015  . Hyperlipidemia 02/13/2015  . OAB (overactive bladder) 02/13/2015  . Gastroesophageal reflux disease without esophagitis 02/13/2015  . Anemia, iron deficiency 02/13/2015  . Rhinitis, allergic 02/13/2015  . Asthma, mild persistent 02/13/2015  . Routine general medical examination at a health care facility 02/13/2015  . Impaired fasting blood sugar 02/13/2015  . Excessive drinking alcohol 02/13/2015  . DEGENERATIVE DISC DISEASE, LUMBAR SPINE 11/13/2008  . BACK PAIN WITH RADICULOPATHY 11/13/2008   Past Medical History:  Diagnosis Date  . Allergy    sees [name]  . Anemia    hx/o iron deficiency due to heavy periods  . Asthma   . DDD (degenerative disc disease), lumbar   . Fatty liver   . GERD (gastroesophageal reflux disease)    [name]  . H/O mammogram   . Hiatal hernia    small per EGD, [name]  . Hyperlipidemia 2012   was on pravastatin prior  . Migraine    prior eval with neurologist, Dr. Melton Alar  . Overactive bladder   . PMDD (premenstrual dysphoric disorder)   . Pyelonephritis    age 5yo  . Routine gynecological examination    [place], [name], NP  . Uterine infection 07/2015   s/p novasure ablation  . Vitamin D deficiency   . Wears glasses     Family History  Problem Relation Age of Onset  . Lupus Mother   . Hypertension Mother   . Hyperlipidemia Mother   . Migraines Mother   . Hypertension Father   . Diabetes Father   . Hyperlipidemia Father   . Obesity Sister   . Hypertension Sister   . Diabetes Sister   . Heart disease Paternal Uncle     CABG  . Hyperlipidemia Paternal Uncle   . Cancer Paternal Uncle     multiple with colon cancer  . Cancer Cousin     breast  . Cancer Cousin     ovarian  . Stroke Neg Hx     Past Surgical History:  Procedure Laterality Date  . CHOLECYSTECTOMY    . COLONOSCOPY  2014   repeat 5 years; [name], baseline  . ESOPHAGOGASTRODUODENOSCOPY   2014   [name]  . FOOT SURGERY Left    morton's neuroma  . LASIK     [name]  . LUMBAR DISC SURGERY  07/2013   due to herniated disc, [name]  . NOVASURE ABLATION    . WISDOM TOOTH EXTRACTION     Social History   Occupational History  . Not on file.   Social History Main Topics  . Smoking status: Never Smoker  . Smokeless tobacco: Never Used  . Alcohol use 1.8 oz/week    3 Cans of beer per week     Comment: socially  . Drug use: No  . Sexual activity: Not on file

## 2016-09-07 DIAGNOSIS — J301 Allergic rhinitis due to pollen: Secondary | ICD-10-CM | POA: Diagnosis not present

## 2016-09-07 DIAGNOSIS — J3089 Other allergic rhinitis: Secondary | ICD-10-CM | POA: Diagnosis not present

## 2016-09-07 DIAGNOSIS — J3081 Allergic rhinitis due to animal (cat) (dog) hair and dander: Secondary | ICD-10-CM | POA: Diagnosis not present

## 2016-09-09 ENCOUNTER — Telehealth (INDEPENDENT_AMBULATORY_CARE_PROVIDER_SITE_OTHER): Payer: Self-pay | Admitting: Radiology

## 2016-09-09 DIAGNOSIS — J3089 Other allergic rhinitis: Secondary | ICD-10-CM | POA: Diagnosis not present

## 2016-09-09 DIAGNOSIS — J3081 Allergic rhinitis due to animal (cat) (dog) hair and dander: Secondary | ICD-10-CM | POA: Diagnosis not present

## 2016-09-09 DIAGNOSIS — J301 Allergic rhinitis due to pollen: Secondary | ICD-10-CM | POA: Diagnosis not present

## 2016-09-09 MED FILL — traMADol HCL 50 MG TABS: 50 | 3 days supply | Qty: 15 | Fill #0

## 2016-09-09 NOTE — Telephone Encounter (Signed)
Patient called about the nitro patches, said that she got a massive headache when using them.  I advised her to use 1/4 of one and see if she had a headaches, then go up to 1/2.  She will try this and if she still has headaches, she will call us back to discuss other option for treatment.

## 2016-09-10 ENCOUNTER — Encounter: Payer: Self-pay | Admitting: Physical Therapy

## 2016-09-10 ENCOUNTER — Ambulatory Visit: Payer: 59 | Attending: Orthopedic Surgery | Admitting: Physical Therapy

## 2016-09-10 DIAGNOSIS — R2241 Localized swelling, mass and lump, right lower limb: Secondary | ICD-10-CM | POA: Diagnosis not present

## 2016-09-10 DIAGNOSIS — R262 Difficulty in walking, not elsewhere classified: Secondary | ICD-10-CM | POA: Insufficient documentation

## 2016-09-10 DIAGNOSIS — M25571 Pain in right ankle and joints of right foot: Secondary | ICD-10-CM | POA: Diagnosis not present

## 2016-09-10 NOTE — Therapy (Signed)
[place], [place], [address] Phone: [phone]   Fax:  [phone]  Physical Therapy Evaluation  Patient Details  Name: Michelle Griffin MRN: [id number] Date of Birth: Jun 11, 1970 Referring Provider: Sharol Given  Encounter Date: 09/10/2016      PT End of Session - 09/10/16 1002    Visit Number 1   PT Start Time 0924   PT Stop Time 1015   PT Time Calculation (min) 51 min   Activity Tolerance Patient tolerated treatment well   Behavior During Therapy [facility] for tasks assessed/performed      Past Medical History:  Diagnosis Date  . Allergy    sees [name]  . Anemia    hx/o iron deficiency due to heavy periods  . Asthma   . DDD (degenerative disc disease), lumbar   . Fatty liver   . GERD (gastroesophageal reflux disease)    [name]  . H/O mammogram   . Hiatal hernia    small per EGD, [name]  . Hyperlipidemia 2012   was on pravastatin prior  . Migraine    prior eval with neurologist, Dr. Melton Alar  . Overactive bladder   . PMDD (premenstrual dysphoric disorder)   . Pyelonephritis    age 30yo  . Routine gynecological examination    [place], [name], NP  . Uterine infection 07/2015   s/p novasure ablation  . Vitamin D deficiency   . Wears glasses     Past Surgical History:  Procedure Laterality Date  . CHOLECYSTECTOMY    . COLONOSCOPY  2014   repeat 5 years; [name], baseline  . ESOPHAGOGASTRODUODENOSCOPY  2014   [name]  . FOOT SURGERY Left    morton's neuroma  . LASIK     [name]  . LUMBAR DISC SURGERY  07/2013   due to herniated disc, [name]  . NOVASURE ABLATION    . WISDOM TOOTH EXTRACTION      There were no vitals filed for this visit.       Subjective Assessment - 09/10/16 0931    Subjective Patient reports that she has had right heel pain for 3-4 years, does not remember an injury, has been diagnosed with achilles tendonitis/bursitis.   She has a large knot on the right achilles., she does report some pain in the plantar fascia bilaterally   Limitations Walking;Standing   Patient Stated Goals have less pain and no difficulty walking   Currently in Pain? Yes   Pain Score 3    Pain Location Ankle   Pain Orientation Right;Left   Pain Descriptors / Indicators Aching   Pain Type Chronic pain   Pain Onset More than a month ago   Pain Frequency Constant   Aggravating Factors  standing, as the day goes on, first thing in the AM pain up to 9/10 this happens daily now   Pain Relieving Factors with rest  pain at best a 3/10            [facility] PT Assessment - 09/10/16 0001      Assessment   Medical Diagnosis right achilles tendonitis, bilateral plantar fascitis   Referring Provider Sharol Given   Onset Date/Surgical Date 08/27/16   Prior Therapy no     Precautions   Precautions None     Balance Screen   Has the patient fallen in the past 6 months No   Has the patient had a decrease in activity  level because of a fear of falling?  No   Is the patient reluctant to leave their home because of a fear of falling?  No     Home Environment   Additional Comments has stairs into the home,      Prior Function   Level of Independence Independent   Vocation Full time employment   Vocation Requirements mostly sitting at a desk   Leisure was going to the gym until about 3 weeks ago     Observation/Other Assessments-Edema    Edema Circumferential     Circumferential Edema   Circumferential - Right has a large knot in the achilles area 4cmx4cm     ROM / Strength   AROM / PROM / Strength AROM;PROM;Strength     AROM   AROM Assessment Site Ankle   Right/Left Ankle Right   Right Ankle Dorsiflexion -4  4 degrees from neutral   Right Ankle Plantar Flexion 45   Right Ankle Inversion 25   Right Ankle Eversion 13     PROM   Overall PROM Comments PROM of the right ankle to 0 degrees DF     Strength   Overall Strength Comments  WFL's except for PF was 3+/5 due to pain in the achilles to 7/10     Flexibility   Soft Tissue Assessment /Muscle Length --  very tight PF, HS and calves     Palpation   Palpation comment very tender in the achilles, she is very tight and some tenderness in the PF bilaterally     Ambulation/Gait   Gait Comments Patient no device, antalgic on the right, decreased toe                    [facility] Adult PT Treatment/Exercise - 09/10/16 0001      Modalities   Modalities Ultrasound;Iontophoresis     Ultrasound   Ultrasound Location right achilles   Ultrasound Parameters 3.3MHz 1.5 w/cm2   Ultrasound Goals Pain;Edema     Iontophoresis   Type of Iontophoresis Dexamethasone   Location right achilles   Dose 47mA   Time 4 hour patch#1                PT Education - 09/10/16 1002    Education provided Yes   Education Details calf stretch, plantar fascia frozen water bottle rolling   Person(s) Educated Patient   Methods Explanation;Demonstration   Comprehension Verbalized understanding          PT Short Term Goals - 09/10/16 1005      PT SHORT TERM GOAL #1   Title independent with initial HEP   Time 2   Period Weeks   Status New           PT Long Term Goals - 09/10/16 1005      PT LONG TERM GOAL #1   Title decrease pain 50%   Time 8   Period Weeks   Status New     PT LONG TERM GOAL #2   Title increase DF of the right ankle to 5 degrees   Time 8   Period Weeks   Status New     PT LONG TERM GOAL #3   Title walk all distances with minimal deviation   Time 8   Period Weeks   Status New     PT LONG TERM GOAL #4   Title increase PF strength to 4+/5 without pain   Time 8   Period Weeks  Status New               Plan - 09/10/16 1002    Clinical Impression Statement Patient with right achilles tendonits/bursitis, also with bilateral plantar fascitis.  She reports some right heel pain for 4 years, has a large and very tender knot  here that is 4cmx4cm.  Has veryt tight PF, calves and HS, high arch.  Poor gait with antalgic on the right and decreased stance phase/toe off.   Rehab Potential Good   PT Frequency 1x / week   PT Duration 8 weeks   PT Treatment/Interventions ADLs/Self Care Home Management;Cryotherapy;Electrical Stimulation;Iontophoresis 4mg /ml Dexamethasone;Stair training;Gait training;Ultrasound;Moist Heat;Therapeutic activities;Therapeutic exercise;Balance training;Neuromuscular re-education;Patient/family education;Manual techniques;Taping   PT Next Visit Plan continue with the US/ionto, assure stretches   Consulted and Agree with Plan of Care Patient      Patient will benefit from skilled therapeutic intervention in order to improve the following deficits and impairments:  Abnormal gait, Decreased activity tolerance, Decreased balance, Decreased mobility, Decreased strength, Increased edema, Impaired flexibility, Pain, Decreased range of motion, Difficulty walking  Visit Diagnosis: Pain in right ankle and joints of right foot - Plan: PT plan of care cert/re-cert  Difficulty in walking, not elsewhere classified - Plan: PT plan of care cert/re-cert  Localized swelling, mass and lump, right lower limb - Plan: PT plan of care cert/re-cert     Problem List Patient Active Problem List   Diagnosis Date Noted  . Achilles tendinitis, right leg 09/02/2016  . Plantar fasciitis, left 09/02/2016  . Achilles tendon contracture, bilateral 09/02/2016  . Encounter for health maintenance examination in adult 02/17/2016  . Need for prophylactic vaccination against Streptococcus pneumoniae (pneumococcus) 02/17/2016  . Obesity 02/17/2016  . Dysuria 02/17/2016  . Cervical dysplasia 07/29/2015  . Menorrhagia 07/29/2015  . Uterine fibroid 07/29/2015  . Fever postop 07/27/2015  . Fatty liver 02/13/2015  . Vitamin D deficiency 02/13/2015  . PMDD (premenstrual dysphoric disorder) 02/13/2015  . Hyperlipidemia  02/13/2015  . OAB (overactive bladder) 02/13/2015  . Gastroesophageal reflux disease without esophagitis 02/13/2015  . Anemia, iron deficiency 02/13/2015  . Rhinitis, allergic 02/13/2015  . Asthma, mild persistent 02/13/2015  . Routine general medical examination at a health care facility 02/13/2015  . Impaired fasting blood sugar 02/13/2015  . Excessive drinking alcohol 02/13/2015  . DEGENERATIVE DISC DISEASE, LUMBAR SPINE 11/13/2008  . BACK PAIN WITH RADICULOPATHY 11/13/2008    Sumner Boast., PT 09/10/2016, 10:08 AM  [facility] [address] [place], [place], [address] Phone: [phone]   Fax:  [phone]  Name: Michelle Griffin MRN: [id number] Date of Birth: 09-12-1970

## 2016-09-14 MED FILL — CONTRAVE ER 8-90 MG TABLET: 8-90 | 30 days supply | Qty: 120 | Fill #2

## 2016-09-16 ENCOUNTER — Ambulatory Visit: Payer: 59 | Admitting: Physical Therapy

## 2016-09-16 ENCOUNTER — Encounter: Payer: Self-pay | Admitting: Physical Therapy

## 2016-09-16 DIAGNOSIS — J3089 Other allergic rhinitis: Secondary | ICD-10-CM | POA: Diagnosis not present

## 2016-09-16 DIAGNOSIS — R262 Difficulty in walking, not elsewhere classified: Secondary | ICD-10-CM | POA: Diagnosis not present

## 2016-09-16 DIAGNOSIS — M25571 Pain in right ankle and joints of right foot: Secondary | ICD-10-CM | POA: Diagnosis not present

## 2016-09-16 DIAGNOSIS — J3081 Allergic rhinitis due to animal (cat) (dog) hair and dander: Secondary | ICD-10-CM | POA: Diagnosis not present

## 2016-09-16 DIAGNOSIS — J301 Allergic rhinitis due to pollen: Secondary | ICD-10-CM | POA: Diagnosis not present

## 2016-09-16 DIAGNOSIS — R2241 Localized swelling, mass and lump, right lower limb: Secondary | ICD-10-CM | POA: Diagnosis not present

## 2016-09-16 NOTE — Therapy (Signed)
[place], [place], [address] Phone: [phone]   Fax:  [phone]  Physical Therapy Treatment  Patient Details  Name: Michelle Griffin MRN: [id number] Date of Birth: March 19, 1971 Referring Provider: Sharol Given  Encounter Date: 09/16/2016      PT End of Session - 09/16/16 0933    Visit Number 2   PT Start Time 0850   PT Stop Time 0930   PT Time Calculation (min) 40 min   Activity Tolerance Patient tolerated treatment well   Behavior During Therapy [facility] for tasks assessed/performed      Past Medical History:  Diagnosis Date  . Allergy    sees [name]  . Anemia    hx/o iron deficiency due to heavy periods  . Asthma   . DDD (degenerative disc disease), lumbar   . Fatty liver   . GERD (gastroesophageal reflux disease)    [name]  . H/O mammogram   . Hiatal hernia    small per EGD, [name]  . Hyperlipidemia 2012   was on pravastatin prior  . Migraine    prior eval with neurologist, Dr. Melton Alar  . Overactive bladder   . PMDD (premenstrual dysphoric disorder)   . Pyelonephritis    age 46yo  . Routine gynecological examination    [place], [name], NP  . Uterine infection 07/2015   s/p novasure ablation  . Vitamin D deficiency   . Wears glasses     Past Surgical History:  Procedure Laterality Date  . CHOLECYSTECTOMY    . COLONOSCOPY  2014   repeat 5 years; [name], baseline  . ESOPHAGOGASTRODUODENOSCOPY  2014   [name]  . FOOT SURGERY Left    morton's neuroma  . LASIK     [name]  . LUMBAR DISC SURGERY  07/2013   due to herniated disc, [name]  . NOVASURE ABLATION    . WISDOM TOOTH EXTRACTION      There were no vitals filed for this visit.      Subjective Assessment - 09/16/16 0849    Subjective Patient reports no pain just stiffness, and feel a lot better and has been doing her stretches at home.    Currently in Pain? No/denies                          [facility] Adult PT Treatment/Exercise - 09/16/16 0001      Exercises   Exercises Ankle     Iontophoresis   Type of Iontophoresis Dexamethasone   Location right achilles   Dose 97mA   Time 4 hour patch#1     Manual Therapy   Manual Therapy Soft tissue mobilization;Passive ROM   Manual therapy comments Ice prob to achilles, PROM of ankle, massage up through gastroc     Ankle Exercises: Stretches   Soleus Stretch 3 reps;20 seconds   Gastroc Stretch 3 reps;20 seconds     Ankle Exercises: Aerobic   Stationary Bike L2 x 6 min                  PT Short Term Goals - 09/10/16 1005      PT SHORT TERM GOAL #1   Title independent with initial HEP   Time 2   Period Weeks   Status New           PT Long Term Goals - 09/10/16 1005  PT LONG TERM GOAL #1   Title decrease pain 50%   Time 8   Period Weeks   Status New     PT LONG TERM GOAL #2   Title increase DF of the right ankle to 5 degrees   Time 8   Period Weeks   Status New     PT LONG TERM GOAL #3   Title walk all distances with minimal deviation   Time 8   Period Weeks   Status New     PT LONG TERM GOAL #4   Title increase PF strength to 4+/5 without pain   Time 8   Period Weeks   Status New               Plan - 09/16/16 0934    Clinical Impression Statement Patient reported that her pain in her right achilles had turned into tightness and had no pain. Bursa has decreased in size. She has very tight PF, calves. She reported that she has been doing her stretches at home. You can feel the roughness in her right calf during massage. The patient also stated that the ice massage to the achilles felt very good and especially on the medial border of the bursa.    PT Next Visit Plan Continue with massage and ionto. Check size of bursa.       Patient will benefit from skilled therapeutic intervention in order to improve the following deficits and impairments:   Abnormal gait, Decreased activity tolerance, Decreased balance, Decreased mobility, Decreased strength, Increased edema, Impaired flexibility, Pain, Decreased range of motion, Difficulty walking  Visit Diagnosis: Pain in right ankle and joints of right foot  Difficulty in walking, not elsewhere classified  Localized swelling, mass and lump, right lower limb     Problem List Patient Active Problem List   Diagnosis Date Noted  . Achilles tendinitis, right leg 09/02/2016  . Plantar fasciitis, left 09/02/2016  . Achilles tendon contracture, bilateral 09/02/2016  . Encounter for health maintenance examination in adult 02/17/2016  . Need for prophylactic vaccination against Streptococcus pneumoniae (pneumococcus) 02/17/2016  . Obesity 02/17/2016  . Dysuria 02/17/2016  . Cervical dysplasia 07/29/2015  . Menorrhagia 07/29/2015  . Uterine fibroid 07/29/2015  . Fever postop 07/27/2015  . Fatty liver 02/13/2015  . Vitamin D deficiency 02/13/2015  . PMDD (premenstrual dysphoric disorder) 02/13/2015  . Hyperlipidemia 02/13/2015  . OAB (overactive bladder) 02/13/2015  . Gastroesophageal reflux disease without esophagitis 02/13/2015  . Anemia, iron deficiency 02/13/2015  . Rhinitis, allergic 02/13/2015  . Asthma, mild persistent 02/13/2015  . Routine general medical examination at a health care facility 02/13/2015  . Impaired fasting blood sugar 02/13/2015  . Excessive drinking alcohol 02/13/2015  . DEGENERATIVE DISC DISEASE, LUMBAR SPINE 11/13/2008  . BACK PAIN WITH RADICULOPATHY 11/13/2008    Alan Mulder SPTA 09/16/2016, 9:42 AM  [place] [address] [place], [place], [address] Phone: [phone]   Fax:  [phone]  Name: Michelle Griffin MRN: [id number] Date of Birth: 1970-10-18

## 2016-09-23 ENCOUNTER — Ambulatory Visit: Payer: 59 | Admitting: Physical Therapy

## 2016-09-23 ENCOUNTER — Encounter: Payer: Self-pay | Admitting: Physical Therapy

## 2016-09-23 DIAGNOSIS — R262 Difficulty in walking, not elsewhere classified: Secondary | ICD-10-CM | POA: Diagnosis not present

## 2016-09-23 DIAGNOSIS — R2241 Localized swelling, mass and lump, right lower limb: Secondary | ICD-10-CM

## 2016-09-23 DIAGNOSIS — M25571 Pain in right ankle and joints of right foot: Secondary | ICD-10-CM

## 2016-09-23 NOTE — Therapy (Signed)
[place], [place], [address] Phone: [phone]   Fax:  [phone]  Physical Therapy Treatment  Patient Details  Name: Michelle Griffin MRN: [id number] Date of Birth: November 05, 1970 Referring Provider: Sharol Given  Encounter Date: 09/23/2016      PT End of Session - 09/23/16 1056    Visit Number 3   PT Start Time 1016   PT Stop Time 1056   PT Time Calculation (min) 40 min   Activity Tolerance Patient tolerated treatment well   Behavior During Therapy [facility] for tasks assessed/performed      Past Medical History:  Diagnosis Date  . Allergy    sees [name]  . Anemia    hx/o iron deficiency due to heavy periods  . Asthma   . DDD (degenerative disc disease), lumbar   . Fatty liver   . GERD (gastroesophageal reflux disease)    [name]  . H/O mammogram   . Hiatal hernia    small per EGD, [name]  . Hyperlipidemia 2012   was on pravastatin prior  . Migraine    prior eval with neurologist, Dr. Melton Alar  . Overactive bladder   . PMDD (premenstrual dysphoric disorder)   . Pyelonephritis    age 89yo  . Routine gynecological examination    [place], [name], NP  . Uterine infection 07/2015   s/p novasure ablation  . Vitamin D deficiency   . Wears glasses     Past Surgical History:  Procedure Laterality Date  . CHOLECYSTECTOMY    . COLONOSCOPY  2014   repeat 5 years; [name], baseline  . ESOPHAGOGASTRODUODENOSCOPY  2014   [name]  . FOOT SURGERY Left    morton's neuroma  . LASIK     [name]  . LUMBAR DISC SURGERY  07/2013   due to herniated disc, [name]  . NOVASURE ABLATION    . WISDOM TOOTH EXTRACTION      There were no vitals filed for this visit.      Subjective Assessment - 09/23/16 1021    Subjective Patient reports no pain just stiffness and has been doing her stretches at home   Currently in Pain? No/denies                          [facility] Adult PT Treatment/Exercise - 09/23/16 0001      Iontophoresis   Type of Iontophoresis Dexamethasone   Location right achilles   Dose 37mA   Time 4 hour patch#1     Manual Therapy   Manual Therapy Soft tissue mobilization;Passive ROM   Manual therapy comments Ice prob to achilles, PROM of ankle, massage up through gastroc     Ankle Exercises: Aerobic   Stationary Bike L2 x 6 min     Ankle Exercises: Stretches   Gastroc Stretch 3 reps;20 seconds                  PT Short Term Goals - 09/10/16 1005      PT SHORT TERM GOAL #1   Title independent with initial HEP   Time 2   Period Weeks   Status New           PT Long Term Goals - 09/10/16 1005      PT LONG TERM GOAL #1   Title decrease pain 50%   Time 8   Period Weeks  Status New     PT LONG TERM GOAL #2   Title increase DF of the right ankle to 5 degrees   Time 8   Period Weeks   Status New     PT LONG TERM GOAL #3   Title walk all distances with minimal deviation   Time 8   Period Weeks   Status New     PT LONG TERM GOAL #4   Title increase PF strength to 4+/5 without pain   Time 8   Period Weeks   Status New               Plan - 09/23/16 1057    Clinical Impression Statement Patient reported that she has no pain and it is just stiffness. She reported she has been doing her stretches at home. She expressed that she'd like to do [name] and the soft tissue work together to see how helpful that would be.  The patient is going to schedule for 2 weeks out to see how she does over that time.    PT Next Visit Plan [name] and continue with massage and ionto.       Patient will benefit from skilled therapeutic intervention in order to improve the following deficits and impairments:  Abnormal gait, Decreased activity tolerance, Decreased balance, Decreased mobility, Decreased strength, Increased edema, Impaired flexibility, Pain, Decreased range of motion,  Difficulty walking  Visit Diagnosis: Pain in right ankle and joints of right foot  Difficulty in walking, not elsewhere classified  Localized swelling, mass and lump, right lower limb     Problem List Patient Active Problem List   Diagnosis Date Noted  . Achilles tendinitis, right leg 09/02/2016  . Plantar fasciitis, left 09/02/2016  . Achilles tendon contracture, bilateral 09/02/2016  . Encounter for health maintenance examination in adult 02/17/2016  . Need for prophylactic vaccination against Streptococcus pneumoniae (pneumococcus) 02/17/2016  . Obesity 02/17/2016  . Dysuria 02/17/2016  . Cervical dysplasia 07/29/2015  . Menorrhagia 07/29/2015  . Uterine fibroid 07/29/2015  . Fever postop 07/27/2015  . Fatty liver 02/13/2015  . Vitamin D deficiency 02/13/2015  . PMDD (premenstrual dysphoric disorder) 02/13/2015  . Hyperlipidemia 02/13/2015  . OAB (overactive bladder) 02/13/2015  . Gastroesophageal reflux disease without esophagitis 02/13/2015  . Anemia, iron deficiency 02/13/2015  . Rhinitis, allergic 02/13/2015  . Asthma, mild persistent 02/13/2015  . Routine general medical examination at a health care facility 02/13/2015  . Impaired fasting blood sugar 02/13/2015  . Excessive drinking alcohol 02/13/2015  . DEGENERATIVE DISC DISEASE, LUMBAR SPINE 11/13/2008  . BACK PAIN WITH RADICULOPATHY 11/13/2008    Alan Mulder SPTA 09/23/2016, 11:01 AM  [place] [address] [place], [place], [address] Phone: [phone]   Fax:  [phone]  Name: Michelle Griffin MRN: [id number] Date of Birth: 02/04/1971

## 2016-09-29 DIAGNOSIS — J301 Allergic rhinitis due to pollen: Secondary | ICD-10-CM | POA: Diagnosis not present

## 2016-09-29 DIAGNOSIS — J3089 Other allergic rhinitis: Secondary | ICD-10-CM | POA: Diagnosis not present

## 2016-09-29 DIAGNOSIS — J3081 Allergic rhinitis due to animal (cat) (dog) hair and dander: Secondary | ICD-10-CM | POA: Diagnosis not present

## 2016-10-02 MED FILL — FAMOTIDINE 40 MG TABLET: 40 | 90 days supply | Qty: 90 | Fill #2

## 2016-10-02 MED FILL — FLUTICASONE PROP 50 MCG SPR: 50 | 30 days supply | Qty: 16 | Fill #5

## 2016-10-02 MED FILL — TOLTERODINE TARTRATE 2 MG T: 2 | 30 days supply | Qty: 60 | Fill #2

## 2016-10-02 MED FILL — AZELASTINE HCL 0.05% DROPS: 0.05 | 30 days supply | Qty: 6 | Fill #1

## 2016-10-02 MED FILL — MONTELUKAST SOD 10 MG TAB: 10 | 30 days supply | Qty: 30 | Fill #1

## 2016-10-02 MED FILL — LEVOCETIRIZINE 5 MG TABLET: 5 | 30 days supply | Qty: 30 | Fill #1

## 2016-10-06 ENCOUNTER — Ambulatory Visit: Payer: 59 | Admitting: Physical Therapy

## 2016-10-07 DIAGNOSIS — J3081 Allergic rhinitis due to animal (cat) (dog) hair and dander: Secondary | ICD-10-CM | POA: Diagnosis not present

## 2016-10-07 DIAGNOSIS — J3089 Other allergic rhinitis: Secondary | ICD-10-CM | POA: Diagnosis not present

## 2016-10-07 DIAGNOSIS — J4520 Mild intermittent asthma, uncomplicated: Secondary | ICD-10-CM | POA: Diagnosis not present

## 2016-10-07 DIAGNOSIS — H1045 Other chronic allergic conjunctivitis: Secondary | ICD-10-CM | POA: Diagnosis not present

## 2016-10-07 DIAGNOSIS — J301 Allergic rhinitis due to pollen: Secondary | ICD-10-CM | POA: Diagnosis not present

## 2016-10-07 DIAGNOSIS — J209 Acute bronchitis, unspecified: Secondary | ICD-10-CM | POA: Diagnosis not present

## 2016-10-07 MED FILL — AZELASTINE 0.1% (137 MCG) S: 0.1 | 90 days supply | Qty: 60 | Fill #0

## 2016-10-07 MED FILL — AZITHROMYCIN 250 MG TAB: 250 | 5 days supply | Qty: 6 | Fill #0

## 2016-10-07 MED FILL — VENTOLIN HFA 90 MCG INHALER: 108 (90 BAS | 16 days supply | Qty: 18 | Fill #0

## 2016-10-07 MED FILL — FLUCONAZOLE 150 MG TABLET: 150 | 2 days supply | Qty: 2 | Fill #0

## 2016-10-13 ENCOUNTER — Ambulatory Visit: Payer: 59 | Admitting: Physical Therapy

## 2016-10-16 ENCOUNTER — Other Ambulatory Visit: Payer: Self-pay | Admitting: Medical

## 2016-10-16 NOTE — Telephone Encounter (Signed)
Is this okay to refill? 

## 2016-10-20 DIAGNOSIS — J3081 Allergic rhinitis due to animal (cat) (dog) hair and dander: Secondary | ICD-10-CM | POA: Diagnosis not present

## 2016-10-20 DIAGNOSIS — J301 Allergic rhinitis due to pollen: Secondary | ICD-10-CM | POA: Diagnosis not present

## 2016-10-20 DIAGNOSIS — J3089 Other allergic rhinitis: Secondary | ICD-10-CM | POA: Diagnosis not present

## 2016-10-22 ENCOUNTER — Ambulatory Visit (INDEPENDENT_AMBULATORY_CARE_PROVIDER_SITE_OTHER): Payer: 59 | Admitting: Medical

## 2016-10-22 ENCOUNTER — Encounter: Payer: Self-pay | Admitting: Medical

## 2016-10-22 VITALS — BP 128/70 | HR 86 | Wt 217.0 lb

## 2016-10-22 DIAGNOSIS — J3089 Other allergic rhinitis: Secondary | ICD-10-CM | POA: Diagnosis not present

## 2016-10-22 DIAGNOSIS — M62830 Muscle spasm of back: Secondary | ICD-10-CM | POA: Diagnosis not present

## 2016-10-22 DIAGNOSIS — E669 Obesity, unspecified: Secondary | ICD-10-CM | POA: Diagnosis not present

## 2016-10-22 DIAGNOSIS — R7301 Impaired fasting glucose: Secondary | ICD-10-CM

## 2016-10-22 DIAGNOSIS — M5441 Lumbago with sciatica, right side: Secondary | ICD-10-CM | POA: Insufficient documentation

## 2016-10-22 DIAGNOSIS — J301 Allergic rhinitis due to pollen: Secondary | ICD-10-CM | POA: Diagnosis not present

## 2016-10-22 DIAGNOSIS — J3081 Allergic rhinitis due to animal (cat) (dog) hair and dander: Secondary | ICD-10-CM | POA: Diagnosis not present

## 2016-10-22 MED ORDER — METHOCARBAMOL 500 MG PO TABS: 500.0000 mg | ORAL_TABLET | Freq: Three times a day (TID) | ORAL | 0 refills | Status: DC

## 2016-10-22 MED ORDER — NALTREXONE-BUPROPION HCL ER 8-90 MG PO TB12: 2.0000 | ORAL_TABLET | Freq: Two times a day (BID) | ORAL | 2 refills | Status: DC

## 2016-10-22 MED ORDER — PREDNISONE 10 MG PO TABS: ORAL_TABLET | ORAL | 0 refills | Status: DC

## 2016-10-22 MED FILL — CONTRAVE ER 8-90 MG TABLET: 8-90 | 30 days supply | Qty: 120 | Fill #0

## 2016-10-22 MED FILL — METHOCARBAMOL 500 MG TABLET: 500 | 6 days supply | Qty: 20 | Fill #0

## 2016-10-22 MED FILL — predniSONE 10 MG TABS: 10 | 6 days supply | Qty: 21 | Fill #0

## 2016-10-22 NOTE — Addendum Note (Signed)
Addended by: Carlena Hurl on: 10/22/2016 11:56 AM   Modules accepted: Level of Service

## 2016-10-22 NOTE — Progress Notes (Signed)
Subjective: Chief Complaint  Patient presents with  . med check    med check , back pain x 5 days    Here for recheck on Contrave for weight loss.   She has lost weight, but has really noticed the improvements with dropping 2 pant sizes.  Going to gym 3 mornings per week through [facility].  Eating healthy.   Wants to c/t Contrave.  Here for back injury.  During her exercise class at the gym last Friday 10/17/16, she was prone on her stomach doing back stretches to the side when she felt a pull or pain in her right tower back . all week she has back pain, hurts to move in flexion and extension. Back is locked up, can't move.   Using Mobic but this isn't helping. In the past prednisone dose pack has helped. Getting some numbness and tingling in right leg on top of thigh.  Has hx/o back surgery.   No other aggravating or relieving factors. No other complaint.  Daughter graduates from high school next week.  Past Medical History:  Diagnosis Date  . Allergy    sees [name]  . Anemia    hx/o iron deficiency due to heavy periods  . Asthma   . DDD (degenerative disc disease), lumbar   . Fatty liver   . GERD (gastroesophageal reflux disease)    [name]  . H/O mammogram   . Hiatal hernia    small per EGD, [name]  . Hyperlipidemia 2012   was on pravastatin prior  . Migraine    prior eval with neurologist, Dr. Melton Alar  . Overactive bladder   . PMDD (premenstrual dysphoric disorder)   . Pyelonephritis    age 57yo  . Routine gynecological examination    Hato Candal, [name], NP  . Uterine infection 07/2015   s/p novasure ablation  . Vitamin D deficiency   . Wears glasses    Current Outpatient Prescriptions on File Prior to Visit  Medication Sig Dispense Refill  . albuterol (PROVENTIL HFA;VENTOLIN HFA) 108 (90 BASE) MCG/ACT inhaler Inhale 2 puffs into the lungs every 6 (six) hours as needed for wheezing or shortness of breath. 1 Inhaler 0  . Biotin 10 MG CAPS Take by mouth.     . EPINEPHrine (EPI-PEN) 0.3 mg/0.3 mL SOAJ injection Inject 0.3 mg into the muscle once.    . famotidine (PEPCID) 40 MG tablet Take 1 tablet (40 mg total) by mouth daily. 90 tablet 3  . fluticasone (FLONASE) 50 MCG/ACT nasal spray Place into both nostrils daily.    [name] ipratropium (ATROVENT) 0.06 % nasal spray Place 2 sprays into both nostrils 4 (four) times daily. 15 mL 1  . levocetirizine (XYZAL) 5 MG tablet Take 5 mg by mouth every evening.    . loratadine (CLARITIN) 10 MG tablet Take 10 mg by mouth daily.    . montelukast (SINGULAIR) 10 MG tablet Take 10 mg by mouth at bedtime.    . nitroGLYCERIN (NITRODUR - DOSED IN MG/24 HR) 0.2 mg/hr patch Place 1 patch (0.2 mg total) onto the skin daily. 30 patch 12  . sertraline (ZOLOFT) 100 MG tablet Take 1 tablet (100 mg total) by mouth daily. 90 tablet 3  . tolterodine (DETROL) 2 MG tablet Take 2 mg by mouth at bedtime.     . traMADol (ULTRAM) 50 MG tablet Take 1 tablet (50 mg total) by mouth every 6 (six) hours as needed. 15 tablet 0  . Vitamin D, Ergocalciferol, (  DRISDOL) 50000 units CAPS capsule Take 1 capsule (50,000 Units total) by mouth every 7 (seven) days. 12 capsule 3  . erythromycin (ROMYCIN) ophthalmic ointment Place 1 application into both eyes 4 (four) times daily. 3.5 g 0   No current facility-administered medications on file prior to visit.    ROS as in subjective   Objective: BP 128/70   Pulse 86   Wt 217 lb (98.4 kg)   SpO2 99%   BMI 32.05 kg/m   Wt Readings from Last 3 Encounters:  10/22/16 217 lb (98.4 kg)  09/02/16 223 lb (101.2 kg)  04/07/16 223 lb 6.4 oz (101.3 kg)   Gen: wd, wn, nad Tender right lower back paraspinal, +spasm, pain with flexion and extension which ROM is quite limited due to pain.  She seems to be in less pain standing.   Otherwise nontender, there is a lumbar surgical scar. Legs nontender, hips normal ROM without pain Pain with SLR at 30 degrees on right otherwise legs neurovascularly  intact Abdomen: nontender, no masses, no organomegaly    Assessment: Encounter Diagnoses  Name Primary?  . Acute right-sided low back pain with right-sided sciatica Yes  . Back spasm   . Obesity with serious comorbidity, unspecified classification, unspecified obesity type   . Impaired fasting blood sugar     Plan: Low back pain, spasm - advised short term relative rest, stretching, heat, gentle stretching and ROM activity, can use Robaxin prn.   If not improving in a few days, can add Prednisone.   She already has some ultram at home for prn use.  If not much improved within a week then recheck.  Obesity, impaired glucose - glad to hear she is seeing improvements.  C/t healthy, c/t exercise program, c/t Contrave.  F/u 85mo.   Shylin was seen today for med check.  Diagnoses and all orders for this visit:  Acute right-sided low back pain with right-sided sciatica  Back spasm  Obesity with serious comorbidity, unspecified classification, unspecified obesity type  Impaired fasting blood sugar  Other orders -     Naltrexone-Bupropion HCl ER (CONTRAVE) 8-90 MG TB12; Take 2 tablets by mouth 2 (two) times daily. -     methocarbamol (ROBAXIN) 500 MG tablet; Take 1 tablet (500 mg total) by mouth 3 (three) times daily. -     predniSONE (DELTASONE) 10 MG tablet; 6/5/4/3/2/1 taper

## 2016-10-28 DIAGNOSIS — J3081 Allergic rhinitis due to animal (cat) (dog) hair and dander: Secondary | ICD-10-CM | POA: Diagnosis not present

## 2016-10-28 DIAGNOSIS — J3089 Other allergic rhinitis: Secondary | ICD-10-CM | POA: Diagnosis not present

## 2016-10-28 DIAGNOSIS — J301 Allergic rhinitis due to pollen: Secondary | ICD-10-CM | POA: Diagnosis not present

## 2016-11-02 MED FILL — MONTELUKAST SOD 10 MG TAB: 10 | 90 days supply | Qty: 90 | Fill #0

## 2016-11-02 MED FILL — LEVOCETIRIZINE 5 MG TABLET: 5 | 90 days supply | Qty: 90 | Fill #0

## 2016-11-04 DIAGNOSIS — J301 Allergic rhinitis due to pollen: Secondary | ICD-10-CM | POA: Diagnosis not present

## 2016-11-04 DIAGNOSIS — J3081 Allergic rhinitis due to animal (cat) (dog) hair and dander: Secondary | ICD-10-CM | POA: Diagnosis not present

## 2016-11-04 DIAGNOSIS — J3089 Other allergic rhinitis: Secondary | ICD-10-CM | POA: Diagnosis not present

## 2016-11-12 DIAGNOSIS — J3081 Allergic rhinitis due to animal (cat) (dog) hair and dander: Secondary | ICD-10-CM | POA: Diagnosis not present

## 2016-11-12 DIAGNOSIS — J3089 Other allergic rhinitis: Secondary | ICD-10-CM | POA: Diagnosis not present

## 2016-11-12 DIAGNOSIS — J301 Allergic rhinitis due to pollen: Secondary | ICD-10-CM | POA: Diagnosis not present

## 2016-11-23 MED FILL — SERTRALINE HCL 100 MG TAB: 100 | 90 days supply | Qty: 90 | Fill #2

## 2016-11-25 DIAGNOSIS — J3089 Other allergic rhinitis: Secondary | ICD-10-CM | POA: Diagnosis not present

## 2016-11-25 DIAGNOSIS — J301 Allergic rhinitis due to pollen: Secondary | ICD-10-CM | POA: Diagnosis not present

## 2016-11-25 DIAGNOSIS — J3081 Allergic rhinitis due to animal (cat) (dog) hair and dander: Secondary | ICD-10-CM | POA: Diagnosis not present

## 2016-11-26 MED FILL — NITROGLYCERIN 0.2 MG/HR PTC: 0.2 | 30 days supply | Qty: 30 | Fill #0

## 2016-12-07 DIAGNOSIS — M9905 Segmental and somatic dysfunction of pelvic region: Secondary | ICD-10-CM | POA: Diagnosis not present

## 2016-12-07 DIAGNOSIS — M5416 Radiculopathy, lumbar region: Secondary | ICD-10-CM | POA: Diagnosis not present

## 2016-12-07 DIAGNOSIS — M955 Acquired deformity of pelvis: Secondary | ICD-10-CM | POA: Diagnosis not present

## 2016-12-07 DIAGNOSIS — M9903 Segmental and somatic dysfunction of lumbar region: Secondary | ICD-10-CM | POA: Diagnosis not present

## 2016-12-07 MED FILL — CONTRAVE ER 8-90 MG TABLET: 8-90 | 30 days supply | Qty: 120 | Fill #1

## 2016-12-08 DIAGNOSIS — M9905 Segmental and somatic dysfunction of pelvic region: Secondary | ICD-10-CM | POA: Diagnosis not present

## 2016-12-08 DIAGNOSIS — M9903 Segmental and somatic dysfunction of lumbar region: Secondary | ICD-10-CM | POA: Diagnosis not present

## 2016-12-08 DIAGNOSIS — M955 Acquired deformity of pelvis: Secondary | ICD-10-CM | POA: Diagnosis not present

## 2016-12-08 DIAGNOSIS — M5416 Radiculopathy, lumbar region: Secondary | ICD-10-CM | POA: Diagnosis not present

## 2016-12-09 DIAGNOSIS — J301 Allergic rhinitis due to pollen: Secondary | ICD-10-CM | POA: Diagnosis not present

## 2016-12-09 DIAGNOSIS — M9905 Segmental and somatic dysfunction of pelvic region: Secondary | ICD-10-CM | POA: Diagnosis not present

## 2016-12-09 DIAGNOSIS — J3089 Other allergic rhinitis: Secondary | ICD-10-CM | POA: Diagnosis not present

## 2016-12-09 DIAGNOSIS — M9903 Segmental and somatic dysfunction of lumbar region: Secondary | ICD-10-CM | POA: Diagnosis not present

## 2016-12-09 DIAGNOSIS — M5416 Radiculopathy, lumbar region: Secondary | ICD-10-CM | POA: Diagnosis not present

## 2016-12-09 DIAGNOSIS — J3081 Allergic rhinitis due to animal (cat) (dog) hair and dander: Secondary | ICD-10-CM | POA: Diagnosis not present

## 2016-12-09 DIAGNOSIS — M955 Acquired deformity of pelvis: Secondary | ICD-10-CM | POA: Diagnosis not present

## 2016-12-10 DIAGNOSIS — M9905 Segmental and somatic dysfunction of pelvic region: Secondary | ICD-10-CM | POA: Diagnosis not present

## 2016-12-10 DIAGNOSIS — M5416 Radiculopathy, lumbar region: Secondary | ICD-10-CM | POA: Diagnosis not present

## 2016-12-10 DIAGNOSIS — M9903 Segmental and somatic dysfunction of lumbar region: Secondary | ICD-10-CM | POA: Diagnosis not present

## 2016-12-10 DIAGNOSIS — M955 Acquired deformity of pelvis: Secondary | ICD-10-CM | POA: Diagnosis not present

## 2016-12-11 DIAGNOSIS — J3089 Other allergic rhinitis: Secondary | ICD-10-CM | POA: Diagnosis not present

## 2016-12-11 DIAGNOSIS — J301 Allergic rhinitis due to pollen: Secondary | ICD-10-CM | POA: Diagnosis not present

## 2016-12-11 DIAGNOSIS — J3081 Allergic rhinitis due to animal (cat) (dog) hair and dander: Secondary | ICD-10-CM | POA: Diagnosis not present

## 2016-12-14 DIAGNOSIS — M5416 Radiculopathy, lumbar region: Secondary | ICD-10-CM | POA: Diagnosis not present

## 2016-12-14 DIAGNOSIS — M9905 Segmental and somatic dysfunction of pelvic region: Secondary | ICD-10-CM | POA: Diagnosis not present

## 2016-12-14 DIAGNOSIS — M9903 Segmental and somatic dysfunction of lumbar region: Secondary | ICD-10-CM | POA: Diagnosis not present

## 2016-12-14 DIAGNOSIS — M955 Acquired deformity of pelvis: Secondary | ICD-10-CM | POA: Diagnosis not present

## 2016-12-15 DIAGNOSIS — J301 Allergic rhinitis due to pollen: Secondary | ICD-10-CM | POA: Diagnosis not present

## 2016-12-15 DIAGNOSIS — J3089 Other allergic rhinitis: Secondary | ICD-10-CM | POA: Diagnosis not present

## 2016-12-15 DIAGNOSIS — J3081 Allergic rhinitis due to animal (cat) (dog) hair and dander: Secondary | ICD-10-CM | POA: Diagnosis not present

## 2016-12-16 ENCOUNTER — Encounter: Payer: Self-pay | Admitting: Family Medicine

## 2016-12-16 ENCOUNTER — Ambulatory Visit (INDEPENDENT_AMBULATORY_CARE_PROVIDER_SITE_OTHER): Payer: 59 | Admitting: Family Medicine

## 2016-12-16 VITALS — BP 118/78 | HR 100 | Temp 98.1°F | Ht 70.0 in | Wt 219.0 lb

## 2016-12-16 DIAGNOSIS — J309 Allergic rhinitis, unspecified: Secondary | ICD-10-CM

## 2016-12-16 DIAGNOSIS — J069 Acute upper respiratory infection, unspecified: Secondary | ICD-10-CM

## 2016-12-16 MED ORDER — AMOXICILLIN 500 MG PO TABS: 1000.0000 mg | ORAL_TABLET | Freq: Two times a day (BID) | ORAL | 0 refills | Status: DC

## 2016-12-16 MED ORDER — FLUCONAZOLE 150 MG PO TABS: 150.0000 mg | ORAL_TABLET | Freq: Once | ORAL | 0 refills | Status: AC

## 2016-12-16 MED FILL — AMOXICILLIN 500 MG CAPSULE: 500 | 10 days supply | Qty: 40 | Fill #0

## 2016-12-16 MED FILL — FLUCONAZOLE 150 MG TABLET: 150 | 7 days supply | Qty: 2 | Fill #0

## 2016-12-16 NOTE — Patient Instructions (Addendum)
Drink plenty of water. Start taking mucinex (you can use the plain or DM form; I recommend the 12 hour version) Start sinus rinses (rinse kit or neti-pot) once or twice daily. Use a decongestant (pseudoephedrine or phenylephrine) as needed for sinus pain. You may continue the tylenol for pain.  Start the antibiotic in 3-5 days (or longer) if increasing/worsening sinus pain, and ongoing/worsening discolored mucus or phlegm.   Upper Respiratory Infection, Adult Most upper respiratory infections (URIs) are a viral infection of the air passages leading to the lungs. A URI affects the nose, throat, and upper air passages. The most common type of URI is nasopharyngitis and is typically referred to as "the common cold." URIs run their course and usually go away on their own. Most of the time, a URI does not require medical attention, but sometimes a bacterial infection in the upper airways can follow a viral infection. This is called a secondary infection. Sinus and middle ear infections are common types of secondary upper respiratory infections. Bacterial pneumonia can also complicate a URI. A URI can worsen asthma and chronic obstructive pulmonary disease (COPD). Sometimes, these complications can require emergency medical care and may be life threatening. What are the causes? Almost all URIs are caused by viruses. A virus is a type of germ and can spread from one person to another. What increases the risk? You may be at risk for a URI if:  You smoke.  You have chronic heart or lung disease.  You have a weakened defense (immune) system.  You are very young or very old.  You have nasal allergies or asthma.  You work in crowded or poorly ventilated areas.  You work in health care facilities or schools.  What are the signs or symptoms? Symptoms typically develop 2-3 days after you come in contact with a cold virus. Most viral URIs last 7-10 days. However, viral URIs from the influenza  virus (flu virus) can last 14-18 days and are typically more severe. Symptoms may include:  Runny or stuffy (congested) nose.  Sneezing.  Cough.  Sore throat.  Headache.  Fatigue.  Fever.  Loss of appetite.  Pain in your forehead, behind your eyes, and over your cheekbones (sinus pain).  Muscle aches.  How is this diagnosed? Your health care provider may diagnose a URI by:  Physical exam.  Tests to check that your symptoms are not due to another condition such as: ? Strep throat. ? Sinusitis. ? Pneumonia. ? Asthma.  How is this treated? A URI goes away on its own with time. It cannot be cured with medicines, but medicines may be prescribed or recommended to relieve symptoms. Medicines may help:  Reduce your fever.  Reduce your cough.  Relieve nasal congestion.  Follow these instructions at home:  Take medicines only as directed by your health care provider.  Gargle warm saltwater or take cough drops to comfort your throat as directed by your health care provider.  Use a warm mist humidifier or inhale steam from a shower to increase air moisture. This may make it easier to breathe.  Drink enough fluid to keep your urine clear or pale yellow.  Eat soups and other clear broths and maintain good nutrition.  Rest as needed.  Return to work when your temperature has returned to normal or as your health care provider advises. You may need to stay home longer to avoid infecting others. You can also use a face mask and careful hand washing to prevent spread  of the virus.  Increase the usage of your inhaler if you have asthma.  Do not use any tobacco products, including cigarettes, chewing tobacco, or electronic cigarettes. If you need help quitting, ask your health care provider. How is this prevented? The best way to protect yourself from getting a cold is to practice good hygiene.  Avoid oral or hand contact with people with cold symptoms.  Wash your hands  often if contact occurs.  There is no clear evidence that vitamin C, vitamin E, echinacea, or exercise reduces the chance of developing a cold. However, it is always recommended to get plenty of rest, exercise, and practice good nutrition. Contact a health care provider if:  You are getting worse rather than better.  Your symptoms are not controlled by medicine.  You have chills.  You have worsening shortness of breath.  You have brown or red mucus.  You have yellow or brown nasal discharge.  You have pain in your face, especially when you bend forward.  You have a fever.  You have swollen neck glands.  You have pain while swallowing.  You have white areas in the back of your throat. Get help right away if:  You have severe or persistent: ? Headache. ? Ear pain. ? Sinus pain. ? Chest pain.  You have chronic lung disease and any of the following: ? Wheezing. ? Prolonged cough. ? Coughing up blood. ? A change in your usual mucus.  You have a stiff neck.  You have changes in your: ? Vision. ? Hearing. ? Thinking. ? Mood. This information is not intended to replace advice given to you by your health care provider. Make sure you discuss any questions you have with your health care provider. Document Released: 11/11/2000 Document Revised: 01/19/2016 Document Reviewed: 08/23/2013 Elsevier Interactive Patient Education  2017 [employer].

## 2016-12-16 NOTE — Progress Notes (Signed)
Chief Complaint  Patient presents with  . Facial Pain    pain and pressure with green mucus. Throat started feeling scratchy 2-3 days ago. Thinks she may have had a temp last night. If abx are given she will need diflucan as she always gets a yeast infection.    Sneezing and scratchy throat started 2-3 days ago, thought it might be related to allergies.  Yesterday her face hurt, head was pounding, and coughing up green phlegm a few times throughout the day yesterday.  She had chills last night (didn't check temp).  She has fatigued, just wants to sleep. Denies myalgias.  She has h/o chronic allergies, on immunotherapy and is scheduled for f/u testing next week.  Denies rashes Denies tick bites.  Going OOT tomorrow for the weekend. Denies sick contacts.  PMH, PSH, SH reviewed  Outpatient Encounter Prescriptions as of 12/16/2016  Medication Sig Note  . acetaminophen (TYLENOL) 500 MG tablet Take 1,000 mg by mouth every 6 (six) hours as needed. 12/16/2016: Last dose 8am   . Biotin 10 MG CAPS Take by mouth.   . famotidine (PEPCID) 40 MG tablet Take 1 tablet (40 mg total) by mouth daily.   . fluticasone (FLONASE) 50 MCG/ACT nasal spray Place into both nostrils daily.   [name] ipratropium (ATROVENT) 0.06 % nasal spray Place 2 sprays into both nostrils 4 (four) times daily. 12/16/2016: Uses it BID  . Lactobacillus (PROBIOTIC ACIDOPHILUS) CAPS Take 1 capsule by mouth daily.   [name] levocetirizine (XYZAL) 5 MG tablet Take 5 mg by mouth every evening.   . loratadine (CLARITIN) 10 MG tablet Take 10 mg by mouth daily.   . meloxicam (MOBIC) 15 MG tablet Take 15 mg by mouth daily.   . methocarbamol (ROBAXIN) 500 MG tablet Take 1 tablet (500 mg total) by mouth 3 (three) times daily.   . montelukast (SINGULAIR) 10 MG tablet Take 10 mg by mouth at bedtime.   . Multiple Vitamins-Minerals (MULTIVITAMIN WITH MINERALS) tablet Take 1 tablet by mouth daily.   . Naltrexone-Bupropion HCl ER (CONTRAVE) 8-90 MG TB12 Take 2  tablets by mouth 2 (two) times daily.   . nitroGLYCERIN (NITRODUR - DOSED IN MG/24 HR) 0.2 mg/hr patch Place 1 patch (0.2 mg total) onto the skin daily.   . sertraline (ZOLOFT) 100 MG tablet Take 1 tablet (100 mg total) by mouth daily.   [name] tolterodine (DETROL) 2 MG tablet Take 2 mg by mouth at bedtime.    . traMADol (ULTRAM) 50 MG tablet Take 1 tablet (50 mg total) by mouth every 6 (six) hours as needed.   [name] albuterol (PROVENTIL HFA;VENTOLIN HFA) 108 (90 BASE) MCG/ACT inhaler Inhale 2 puffs into the lungs every 6 (six) hours as needed for wheezing or shortness of breath. (Patient not taking: Reported on 12/16/2016)   . EPINEPHrine (EPI-PEN) 0.3 mg/0.3 mL SOAJ injection Inject 0.3 mg into the muscle once.   . [DISCONTINUED] erythromycin [facility]) ophthalmic ointment Place 1 application into both eyes 4 (four) times daily.   . [DISCONTINUED] predniSONE (DELTASONE) 10 MG tablet 6/5/4/3/2/1 taper   . [DISCONTINUED] Vitamin D, Ergocalciferol, (DRISDOL) 50000 units CAPS capsule Take 1 capsule (50,000 Units total) by mouth every 7 (seven) days.    No facility-administered encounter medications on file as of 12/16/2016.    Also takes a probiotic Due to stop antihistamines tomorrow due to upcoming allergy tests.  Allergies  Allergen Reactions  . Sulfa Antibiotics Hives   ROS: no fever, some chills last night.  No  nausea, vomiting, diarrhea, bleeding, bruising, rash.  Slight cough. No shortness of breath or flare of asthma.  Back pain, seeing chiro.  No radicular symptoms currently.  PHYSICAL EXAM:  BP 118/78 (BP Location: Left Arm, Patient Position: Sitting, Cuff Size: Normal)   Pulse 100   Temp 98.1 F (36.7 C) (Tympanic)   Ht 5' 10"  (1.778 m)   Wt 219 lb (99.3 kg)   BMI 31.42 kg/m   Tired appearing female, with occasional throat-clearing, in no distress HEENT: PERRL, EOMI, conjunctiva and sclera are clear. TM's and EAC's normal. Nasal mucosa is mildly edematous, no erythema or purulence. OP  shows some erythema in posterior OP, moist mucus membranes. Tender over bilateral maxillary sinuses, nontender over frontal sinuses Neck: no lymphadenopathy or mass Heart: regular rate and rhythm without murmur Lungs clear bilaterally Skin: normal turgor, no rash Psych: normal mood, affect, hygiene and grooming Neuro: alert and oriented, cranial nerves intact, normal gait   ASSESSMENT/PLAN:  Upper respiratory tract infection, unspecified type - Plan: amoxicillin (AMOXIL) 500 MG tablet, fluconazole (DIFLUCAN) 150 MG tablet  Allergic rhinitis, unspecified seasonality, unspecified trigger  She has h/o frequent yeast infections with ABX, requesting diflucan to use prn.  Proper way to use reviewed. Advised of supportive measures, and to hold off on antibiotics unless symptoms persist/worsen, is definitely in the realm of a typical viral URI with sinus pressure/pain (not necessarily bacterial infection). All questions were answered. Including questions about her sister and diabetes.    Drink plenty of water. Start taking mucinex (you can use the plain or DM form; I recommend the 12 hour version) Start sinus rinses (rinse kit or neti-pot) once or twice daily. Use a decongestant (pseudoephedrine or phenylephrine) as needed for sinus pain. You may continue the tylenol for pain.  Start the antibiotic in 3-5 days (or longer) if increasing/worsening sinus pain, and ongoing/worsening discolored mucus or phlegm.

## 2016-12-21 DIAGNOSIS — J3089 Other allergic rhinitis: Secondary | ICD-10-CM | POA: Diagnosis not present

## 2016-12-21 DIAGNOSIS — J4520 Mild intermittent asthma, uncomplicated: Secondary | ICD-10-CM | POA: Diagnosis not present

## 2016-12-21 DIAGNOSIS — J301 Allergic rhinitis due to pollen: Secondary | ICD-10-CM | POA: Diagnosis not present

## 2016-12-21 DIAGNOSIS — H1045 Other chronic allergic conjunctivitis: Secondary | ICD-10-CM | POA: Diagnosis not present

## 2016-12-22 DIAGNOSIS — M955 Acquired deformity of pelvis: Secondary | ICD-10-CM | POA: Diagnosis not present

## 2016-12-22 DIAGNOSIS — M9903 Segmental and somatic dysfunction of lumbar region: Secondary | ICD-10-CM | POA: Diagnosis not present

## 2016-12-22 DIAGNOSIS — M5416 Radiculopathy, lumbar region: Secondary | ICD-10-CM | POA: Diagnosis not present

## 2016-12-22 DIAGNOSIS — M9905 Segmental and somatic dysfunction of pelvic region: Secondary | ICD-10-CM | POA: Diagnosis not present

## 2016-12-24 DIAGNOSIS — M9905 Segmental and somatic dysfunction of pelvic region: Secondary | ICD-10-CM | POA: Diagnosis not present

## 2016-12-24 DIAGNOSIS — M9903 Segmental and somatic dysfunction of lumbar region: Secondary | ICD-10-CM | POA: Diagnosis not present

## 2016-12-24 DIAGNOSIS — M5416 Radiculopathy, lumbar region: Secondary | ICD-10-CM | POA: Diagnosis not present

## 2016-12-24 DIAGNOSIS — M955 Acquired deformity of pelvis: Secondary | ICD-10-CM | POA: Diagnosis not present

## 2017-01-04 MED FILL — FAMOTIDINE 40 MG TABLET: 40 | 90 days supply | Qty: 90 | Fill #3

## 2017-01-04 MED FILL — TOLTERODINE TARTRATE 2 MG T: 2 | 30 days supply | Qty: 60 | Fill #3

## 2017-01-06 DIAGNOSIS — J3081 Allergic rhinitis due to animal (cat) (dog) hair and dander: Secondary | ICD-10-CM | POA: Diagnosis not present

## 2017-01-06 DIAGNOSIS — J3089 Other allergic rhinitis: Secondary | ICD-10-CM | POA: Diagnosis not present

## 2017-01-06 DIAGNOSIS — J301 Allergic rhinitis due to pollen: Secondary | ICD-10-CM | POA: Diagnosis not present

## 2017-01-11 ENCOUNTER — Telehealth: Payer: Self-pay | Admitting: *Deleted

## 2017-01-11 MED ORDER — CLARITHROMYCIN ER 500 MG PO TB24: 1000.0000 mg | ORAL_TABLET | Freq: Every day | ORAL | 0 refills | Status: DC

## 2017-01-11 MED FILL — CLARITHROMYCIN ER 500 MG TA: 500 | 10 days supply | Qty: 20 | Fill #0

## 2017-01-11 NOTE — Telephone Encounter (Signed)
Patient called and states that she saw you about 3-4 weeks ago for URI. She got somewhat better with the amoxil but symptoms came back "full on" last Monday-she waited thinking they would pass but they haven't. She is having a lot of right sided sinus pain and pressure as well as right ear pain as well as lots yellow/green mucus-no fevers. Taking mucinex. Wondered if you would call her in something different, maybe a Zpak? Uses [facility].

## 2017-01-11 NOTE — Telephone Encounter (Signed)
Advise pt that I sent rx for Biaxin XL to [place].  This is similar to azithromycin, but taken 2 tablets once a day for 10 days (I think it might work a little better than a z-pak).  Let us know if she isn't completley better by the end of the course. Return for recheck if not getting any better

## 2017-01-11 NOTE — Telephone Encounter (Signed)
Patient advised.

## 2017-01-11 NOTE — Telephone Encounter (Signed)
error 

## 2017-01-13 DIAGNOSIS — J301 Allergic rhinitis due to pollen: Secondary | ICD-10-CM | POA: Diagnosis not present

## 2017-01-13 DIAGNOSIS — J3081 Allergic rhinitis due to animal (cat) (dog) hair and dander: Secondary | ICD-10-CM | POA: Diagnosis not present

## 2017-01-13 DIAGNOSIS — J3089 Other allergic rhinitis: Secondary | ICD-10-CM | POA: Diagnosis not present

## 2017-01-13 MED FILL — CONTRAVE ER 8-90 MG TABLET: 8-90 | 30 days supply | Qty: 120 | Fill #2

## 2017-01-21 DIAGNOSIS — J3081 Allergic rhinitis due to animal (cat) (dog) hair and dander: Secondary | ICD-10-CM | POA: Diagnosis not present

## 2017-01-21 DIAGNOSIS — J301 Allergic rhinitis due to pollen: Secondary | ICD-10-CM | POA: Diagnosis not present

## 2017-01-21 DIAGNOSIS — J3089 Other allergic rhinitis: Secondary | ICD-10-CM | POA: Diagnosis not present

## 2017-02-02 MED FILL — TOLTERODINE TARTRATE 2 MG T: 2 | 30 days supply | Qty: 60 | Fill #4

## 2017-02-02 MED FILL — LEVOCETIRIZINE 5 MG TABLET: 5 | 90 days supply | Qty: 90 | Fill #1

## 2017-02-03 DIAGNOSIS — J3089 Other allergic rhinitis: Secondary | ICD-10-CM | POA: Diagnosis not present

## 2017-02-03 DIAGNOSIS — J3081 Allergic rhinitis due to animal (cat) (dog) hair and dander: Secondary | ICD-10-CM | POA: Diagnosis not present

## 2017-02-03 DIAGNOSIS — J301 Allergic rhinitis due to pollen: Secondary | ICD-10-CM | POA: Diagnosis not present

## 2017-02-03 MED FILL — MONTELUKAST SOD 10 MG TAB: 10 | 90 days supply | Qty: 90 | Fill #1

## 2017-02-04 ENCOUNTER — Other Ambulatory Visit: Payer: Self-pay | Admitting: Obstetrics and Gynecology

## 2017-02-04 DIAGNOSIS — Z1231 Encounter for screening mammogram for malignant neoplasm of breast: Secondary | ICD-10-CM

## 2017-02-09 DIAGNOSIS — J3089 Other allergic rhinitis: Secondary | ICD-10-CM | POA: Diagnosis not present

## 2017-02-09 DIAGNOSIS — J301 Allergic rhinitis due to pollen: Secondary | ICD-10-CM | POA: Diagnosis not present

## 2017-02-09 DIAGNOSIS — J3081 Allergic rhinitis due to animal (cat) (dog) hair and dander: Secondary | ICD-10-CM | POA: Diagnosis not present

## 2017-02-16 DIAGNOSIS — J3081 Allergic rhinitis due to animal (cat) (dog) hair and dander: Secondary | ICD-10-CM | POA: Diagnosis not present

## 2017-02-16 DIAGNOSIS — J301 Allergic rhinitis due to pollen: Secondary | ICD-10-CM | POA: Diagnosis not present

## 2017-02-16 DIAGNOSIS — J3089 Other allergic rhinitis: Secondary | ICD-10-CM | POA: Diagnosis not present

## 2017-03-03 ENCOUNTER — Other Ambulatory Visit: Payer: Self-pay | Admitting: Medical

## 2017-03-03 DIAGNOSIS — J301 Allergic rhinitis due to pollen: Secondary | ICD-10-CM | POA: Diagnosis not present

## 2017-03-03 DIAGNOSIS — J3089 Other allergic rhinitis: Secondary | ICD-10-CM | POA: Diagnosis not present

## 2017-03-03 DIAGNOSIS — J3081 Allergic rhinitis due to animal (cat) (dog) hair and dander: Secondary | ICD-10-CM | POA: Diagnosis not present

## 2017-03-03 MED FILL — SERTRALINE HCL 100 MG TAB: 100 | 90 days supply | Qty: 90 | Fill #0

## 2017-03-03 NOTE — Telephone Encounter (Signed)
Can pt have a refill on meds  

## 2017-03-04 ENCOUNTER — Other Ambulatory Visit: Payer: Self-pay | Admitting: Medical

## 2017-03-04 MED FILL — CONTRAVE ER 8-90 MG TABLET: 8-90 | 30 days supply | Qty: 120 | Fill #0

## 2017-03-04 NOTE — Telephone Encounter (Signed)
Send in this supply and get in for f/u appt

## 2017-03-04 NOTE — Telephone Encounter (Signed)
Can pt have a refill on meds  

## 2017-03-10 DIAGNOSIS — J301 Allergic rhinitis due to pollen: Secondary | ICD-10-CM | POA: Diagnosis not present

## 2017-03-10 DIAGNOSIS — J3089 Other allergic rhinitis: Secondary | ICD-10-CM | POA: Diagnosis not present

## 2017-03-10 DIAGNOSIS — J3081 Allergic rhinitis due to animal (cat) (dog) hair and dander: Secondary | ICD-10-CM | POA: Diagnosis not present

## 2017-03-23 ENCOUNTER — Encounter: Payer: Self-pay | Admitting: Medical

## 2017-03-23 ENCOUNTER — Ambulatory Visit (INDEPENDENT_AMBULATORY_CARE_PROVIDER_SITE_OTHER): Payer: 59 | Admitting: Medical

## 2017-03-23 VITALS — BP 122/84 | HR 62 | Wt 219.4 lb

## 2017-03-23 DIAGNOSIS — Z0000 Encounter for general adult medical examination without abnormal findings: Secondary | ICD-10-CM | POA: Diagnosis not present

## 2017-03-23 DIAGNOSIS — D509 Iron deficiency anemia, unspecified: Secondary | ICD-10-CM | POA: Diagnosis not present

## 2017-03-23 DIAGNOSIS — J3081 Allergic rhinitis due to animal (cat) (dog) hair and dander: Secondary | ICD-10-CM | POA: Diagnosis not present

## 2017-03-23 DIAGNOSIS — E669 Obesity, unspecified: Secondary | ICD-10-CM | POA: Diagnosis not present

## 2017-03-23 DIAGNOSIS — J301 Allergic rhinitis due to pollen: Secondary | ICD-10-CM | POA: Diagnosis not present

## 2017-03-23 DIAGNOSIS — K760 Fatty (change of) liver, not elsewhere classified: Secondary | ICD-10-CM | POA: Diagnosis not present

## 2017-03-23 DIAGNOSIS — N3281 Overactive bladder: Secondary | ICD-10-CM

## 2017-03-23 DIAGNOSIS — K219 Gastro-esophageal reflux disease without esophagitis: Secondary | ICD-10-CM | POA: Diagnosis not present

## 2017-03-23 DIAGNOSIS — E785 Hyperlipidemia, unspecified: Secondary | ICD-10-CM

## 2017-03-23 DIAGNOSIS — F3281 Premenstrual dysphoric disorder: Secondary | ICD-10-CM | POA: Diagnosis not present

## 2017-03-23 DIAGNOSIS — M5137 Other intervertebral disc degeneration, lumbosacral region: Secondary | ICD-10-CM

## 2017-03-23 DIAGNOSIS — E559 Vitamin D deficiency, unspecified: Secondary | ICD-10-CM | POA: Diagnosis not present

## 2017-03-23 DIAGNOSIS — J3089 Other allergic rhinitis: Secondary | ICD-10-CM | POA: Diagnosis not present

## 2017-03-23 DIAGNOSIS — R7301 Impaired fasting glucose: Secondary | ICD-10-CM

## 2017-03-23 DIAGNOSIS — J4530 Mild persistent asthma, uncomplicated: Secondary | ICD-10-CM | POA: Diagnosis not present

## 2017-03-23 LAB — POCT URINALYSIS DIP (PROADVANTAGE DEVICE)
Bilirubin, UA: NEGATIVE
GLUCOSE UA: NEGATIVE mg/dL
Ketones, POC UA: NEGATIVE mg/dL
Leukocytes, UA: NEGATIVE
NITRITE UA: NEGATIVE
PH UA: 6 (ref 5.0–8.0)
RBC UA: NEGATIVE
SPECIFIC GRAVITY, URINE: 1.03
UUROB: NEGATIVE

## 2017-03-23 LAB — T4, FREE: Free T4: 0.9 ng/dL (ref 0.8–1.8)

## 2017-03-23 LAB — T3: T3, Total: 121 ng/dL (ref 76–181)

## 2017-03-23 MED FILL — NITROGLYCERIN 0.2 MG/HR PTC: 0.2 | 30 days supply | Qty: 30 | Fill #1

## 2017-03-23 NOTE — Patient Instructions (Signed)
Recommendations:  Consider using a personal trainer for a month or 2  Try and make a diet and exercise goal weekly to work towards  You are due for repeat Colonoscopy 2019  You are due for tetanus booster 2019 per our records

## 2017-03-23 NOTE — Addendum Note (Signed)
Addended by: Carlena Hurl on: 03/23/2017 08:59 AM   Modules accepted: Orders

## 2017-03-23 NOTE — Progress Notes (Signed)
Subjective:   HPI  Michelle Griffin is a 46 y.o. female who presents for a complete physical.  Medical care team includes:  [facility] OB/Gynecology  [name], Allergist  Dr. Jolayne Panther, Dentist  [facility] Ophthalmology for eye doctor  [name] - Back specialist/Orthopedist  Dr. Sharol Given, orthopedics  [name], GI  Dorothea Ogle, PA-C here for primary care   Concerns: Was going to the gym regular, but had 2 recent back pain flare ups in recent months.   Goes to [facility] allergy/asthma specialist yearly but gets allergy shots weekly.  Compliant with regimen  Diet - good and bad.  Does have a regular walking group she walks with weekly.   Did spin class yesterday.   Sometimes loses will power with sweets.   Takes 2 contrave in morning, 1 in evening.    PMDD - doing fine on zoloft without c/o.  Uses NTG patch on foot for bursitis.  Dr. Sharol Given  Due for repeat colonoscopy 2019 [name]  Reviewed their medical, surgical, family, social, medication, and allergy history and updated chart as appropriate.  Past Medical History:  Diagnosis Date  . Allergy    sees [name]  . Anemia    hx/o iron deficiency due to heavy periods  . Asthma   . DDD (degenerative disc disease), lumbar   . Fatty liver   . GERD (gastroesophageal reflux disease)    [name]  . H/O mammogram   . Hiatal hernia    small per EGD, [name]  . Hyperlipidemia 2012   was on pravastatin prior  . Migraine    prior eval with neurologist, Dr. Melton Alar  . Overactive bladder   . PMDD (premenstrual dysphoric disorder)   . Pyelonephritis    age 85yo  . Routine gynecological examination    Creola, [name], NP  . Uterine infection 07/2015   s/p novasure ablation  . Vitamin D deficiency   . Wears glasses     Past Surgical History:  Procedure Laterality Date  . CHOLECYSTECTOMY    . COLONOSCOPY  2014   repeat 5 years; [name], baseline  . ESOPHAGOGASTRODUODENOSCOPY  2014   Dr.  Watt Climes  . FOOT SURGERY Left    morton's neuroma  . LASIK     [name]  . LUMBAR DISC SURGERY  07/2013   due to herniated disc, [name]  . NOVASURE ABLATION    . WISDOM TOOTH EXTRACTION      Social History   Social History  . Marital status: Married    Spouse name: N/A  . Number of children: N/A  . Years of education: N/A   Occupational History  . Not on file.   Social History Main Topics  . Smoking status: Never Smoker  . Smokeless tobacco: Never Used  . Alcohol use 1.8 oz/week    3 Cans of beer per week     Comment: socially  . Drug use: No  . Sexual activity: Not on file     Comment: partner had vasectomy   Other Topics Concern  . Not on file   Social History Narrative   Married, 2 children 18yo and 23yo, dog, Christian,was involved in a motorcycle club; exercise - walking, getting 10,000 steps daily.  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 Lupus Mother   . Hypertension Mother   .  Hyperlipidemia Mother   . Migraines Mother   . Hypertension Father   . Diabetes Father   . Hyperlipidemia Father   . Obesity Sister   . Hypertension Sister   . Diabetes Sister   . Heart disease Paternal Uncle        CABG  . Hyperlipidemia Paternal Uncle   . Cancer Paternal Uncle        multiple with colon cancer  . Cancer Cousin        breast  . Cancer Cousin        ovarian  . Ovarian cysts Daughter   . Stroke Neg Hx      Current Outpatient Prescriptions:  .  albuterol (PROVENTIL HFA;VENTOLIN HFA) 108 (90 BASE) MCG/ACT inhaler, Inhale 2 puffs into the lungs every 6 (six) hours as needed for wheezing or shortness of breath., Disp: 1 Inhaler, Rfl: 0 .  Biotin 10 MG CAPS, Take by mouth., Disp: , Rfl:  .  CONTRAVE 8-90 MG TB12, TAKE 2 TABLETS BY MOUTH TWICE DAILY, Disp: 120 tablet, Rfl: 0 .  EPINEPHrine (EPI-PEN) 0.3 mg/0.3 mL SOAJ injection, Inject 0.3 mg into the muscle  once., Disp: , Rfl:  .  famotidine (PEPCID) 40 MG tablet, Take 1 tablet (40 mg total) by mouth daily., Disp: 90 tablet, Rfl: 3 .  fluticasone (FLONASE) 50 MCG/ACT nasal spray, Place into both nostrils daily., Disp: , Rfl:  .  ipratropium (ATROVENT) 0.06 % nasal spray, Place 2 sprays into both nostrils 4 (four) times daily., Disp: 15 mL, Rfl: 1 .  Lactobacillus (PROBIOTIC ACIDOPHILUS) CAPS, Take 1 capsule by mouth daily., Disp: , Rfl:  .  levocetirizine (XYZAL) 5 MG tablet, Take 5 mg by mouth every evening., Disp: , Rfl:  .  meloxicam (MOBIC) 15 MG tablet, Take 15 mg by mouth daily., Disp: , Rfl:  .  methocarbamol (ROBAXIN) 500 MG tablet, Take 1 tablet (500 mg total) by mouth 3 (three) times daily., Disp: 20 tablet, Rfl: 0 .  montelukast (SINGULAIR) 10 MG tablet, Take 10 mg by mouth at bedtime., Disp: , Rfl:  .  Multiple Vitamins-Minerals (MULTIVITAMIN WITH MINERALS) tablet, Take 1 tablet by mouth daily., Disp: , Rfl:  .  nitroGLYCERIN (NITRODUR - DOSED IN MG/24 HR) 0.2 mg/hr patch, Place 1 patch (0.2 mg total) onto the skin daily., Disp: 30 patch, Rfl: 12 .  sertraline (ZOLOFT) 100 MG tablet, TAKE 1 TABLET BY MOUTH ONCE DAILY, Disp: 90 tablet, Rfl: 0 .  tolterodine (DETROL) 2 MG tablet, Take 2 mg by mouth at bedtime. , Disp: , Rfl:   Allergies  Allergen Reactions  . Sulfa Antibiotics Hives    Review of Systems Constitutional: -fever, -chills, -sweats, -unexpected weight change, -decreased appetite, -fatigue Allergy: -sneezing, -itching, -congestion Dermatology: -changing moles, -rash, -lumps ENT: -runny nose, -ear pain, -sore throat, -hoarseness, -sinus pain, -teeth pain, - ringing in ears, -hearing loss, -nosebleeds Cardiology: -chest pain, -palpitations, -swelling, -difficulty breathing when lying flat, -waking up short of breath Respiratory: -cough, -shortness of breath, -difficulty breathing with exercise or exertion, -wheezing, -coughing up blood Gastroenterology: -abdominal pain,  -nausea, -vomiting, -diarrhea, -constipation, -blood in stool, -changes in bowel movement, -difficulty swallowing or eating Hematology: -bleeding, -bruising  Musculoskeletal: -joint aches, -muscle aches, -joint swelling, -back pain, -neck pain, -cramping, -changes in gait Ophthalmology: denies vision changes, eye redness, itching, discharge Urology: -burning with urination, -difficulty urinating, -blood in urine, -urinary frequency, -urgency, -incontinence Neurology: -headache, -weakness, -tingling, -numbness, -memory loss, -falls, -dizziness Psychology: -depressed mood, -agitation, -sleep problems  Objective:   Physical Exam  BP 122/84   Pulse 62   Wt 219 lb 6.4 oz (99.5 kg)   BMI 31.48 kg/m   Wt Readings from Last 3 Encounters:  03/23/17 219 lb 6.4 oz (99.5 kg)  12/16/16 219 lb (99.3 kg)  10/22/16 217 lb (98.4 kg)    General appearance: alert, no distress, WD/WN, white female Skin: scattered benign appearing macules, left forearm posteriorly with 2 prior shave biopsy scars, right forearm posteriorly with linear burn scar, tattoo right anterior distal wrist, tattoo low back with butterfly, no worrisome lesions HEENT: normocephalic, conjunctiva/corneas normal, sclerae anicteric, PERRLA, EOMi, nares patent, no discharge or erythema, pharynx normal Oral cavity: MMM, tongue normal, teeth in good repair Neck: supple, no lymphadenopathy, no thyromegaly, no masses, normal ROM, no bruits Chest: non tender, normal shape and expansion Heart: RRR, normal S1, S2, no murmurs Lungs: CTA bilaterally, no wheezes, rhonchi, or rales Abdomen: +bs, soft, few small RUQ surgical scars, non tender, non distended, no masses, no hepatomegaly, no splenomegaly, no bruits Back: non tender, lumbar surgical scar Musculoskeletal: upper extremities non tender, no obvious deformity, normal ROM throughout, lower extremities non tender, no obvious deformity, normal ROM throughout Extremities: no edema, no  cyanosis, no clubbing Pulses: 2+ symmetric, upper and lower extremities, normal cap refill Neurological: alert, oriented x 3, CN2-12 intact, strength normal upper extremities and lower extremities, sensation normal throughout, DTRs 2+ throughout, no cerebellar signs, gait normal Psychiatric: normal affect, behavior normal, pleasant  Breast/gyn/rectal - deferred to gynecology   Assessment and Plan :    Encounter Diagnoses  Name Primary?  . Routine general medical examination at a health care facility Yes  . Vitamin D deficiency   . PMDD (premenstrual dysphoric disorder)   . Obesity with serious comorbidity, unspecified classification, unspecified obesity type   . Iron deficiency anemia, unspecified iron deficiency anemia type   . Mild persistent asthma without complication   . Allergic rhinitis due to pollen, unspecified seasonality   . Fatty liver   . Gastroesophageal reflux disease without esophagitis   . Impaired fasting blood sugar   . DEGENERATIVE DISC DISEASE, LUMBAR SPINE   . OAB (overactive bladder)   . Hyperlipidemia, unspecified hyperlipidemia type     Physical exam - discussed healthy lifestyle, diet, exercise, preventative care, vaccinations, and addressed their concerns.  PMDD - c/t Zoloft, doing fine on this Anemia history - labs today Vit D deficiency - counseled on dietary and supplement intake, labs today OAB - on detrol per gyn Obesity - work on healthy lifestyle,work on weight loss, f/u pending labs See your eye doctor yearly for routine vision care. See your dentist yearly for routine dental care including hygiene visits twice yearly. See your gynecologist yearly for routine gynecological care. See your allergist regularly Up to date on vaccines.   Is due for Td 2019 Is due for repeat colonoscopy 2019 Follow-up pending labs.  Reagann was seen today for annual exam.  Diagnoses and all orders for this visit:  Routine general medical examination at a health  care facility -     Comprehensive metabolic panel -     CBC -     Lipid panel -     Hemoglobin A1c -     VITAMIN D 25 Hydroxy (Vit-D Deficiency, Fractures)  Vitamin D deficiency -     VITAMIN D 25 Hydroxy (Vit-D Deficiency, Fractures)  PMDD (premenstrual dysphoric disorder)  Obesity with serious comorbidity, unspecified classification, unspecified obesity type  Iron deficiency anemia,  unspecified iron deficiency anemia type  Mild persistent asthma without complication  Allergic rhinitis due to pollen, unspecified seasonality  Fatty liver -     Comprehensive metabolic panel  Gastroesophageal reflux disease without esophagitis  Impaired fasting blood sugar -     Hemoglobin A1c  DEGENERATIVE DISC DISEASE, LUMBAR SPINE  OAB (overactive bladder)  Hyperlipidemia, unspecified hyperlipidemia type -     Lipid panel

## 2017-03-23 NOTE — Addendum Note (Signed)
Addended by: Tyrone Apple on: 03/23/2017 09:11 AM   Modules accepted: Orders

## 2017-03-24 ENCOUNTER — Other Ambulatory Visit: Payer: Self-pay | Admitting: Medical

## 2017-03-24 LAB — COMPREHENSIVE METABOLIC PANEL
AG Ratio: 1.4 (calc) (ref 1.0–2.5)
ALT: 21 U/L (ref 6–29)
AST: 20 U/L (ref 10–30)
Albumin: 4 g/dL (ref 3.6–5.1)
Alkaline phosphatase (APISO): 81 U/L (ref 33–115)
BUN: 9 mg/dL (ref 7–25)
CO2: 26 mmol/L (ref 20–32)
Calcium: 9.1 mg/dL (ref 8.6–10.2)
Chloride: 104 mmol/L (ref 98–110)
Creat: 0.81 mg/dL (ref 0.50–1.10)
Globulin: 2.9 g/dL (calc) (ref 1.9–3.7)
Glucose, Bld: 89 mg/dL (ref 65–99)
Potassium: 4.4 mmol/L (ref 3.5–5.3)
Sodium: 137 mmol/L (ref 135–146)
Total Bilirubin: 0.4 mg/dL (ref 0.2–1.2)
Total Protein: 6.9 g/dL (ref 6.1–8.1)

## 2017-03-24 LAB — LIPID PANEL
Cholesterol: 216 mg/dL — ABNORMAL HIGH (ref <200)
HDL: 79 mg/dL (ref >50)
LDL CHOLESTEROL (CALC): 119 mg/dL — AB
NON-HDL CHOLESTEROL (CALC): 137 mg/dL — AB (ref <130)
TRIGLYCERIDES: 82 mg/dL (ref <150)
Total CHOL/HDL Ratio: 2.7 (calc) (ref <5.0)

## 2017-03-24 LAB — HEMOGLOBIN A1C
Hgb A1c MFr Bld: 5.5 % of total Hgb (ref <5.7)
Mean Plasma Glucose: 111 (calc)
eAG (mmol/L): 6.2 (calc)

## 2017-03-24 LAB — CBC
HCT: 38.4 % (ref 35.0–45.0)
Hemoglobin: 13 g/dL (ref 11.7–15.5)
MCH: 27.8 pg (ref 27.0–33.0)
MCHC: 33.9 g/dL (ref 32.0–36.0)
MCV: 82.1 fL (ref 80.0–100.0)
MPV: 9.5 fL (ref 7.5–12.5)
Platelets: 259 10*3/uL (ref 140–400)
RBC: 4.68 10*6/uL (ref 3.80–5.10)
RDW: 12.8 % (ref 11.0–15.0)
WBC: 5.2 10*3/uL (ref 3.8–10.8)

## 2017-03-24 LAB — VITAMIN D 25 HYDROXY (VIT D DEFICIENCY, FRACTURES): VIT D 25 HYDROXY: 31 ng/mL (ref 30–100)

## 2017-03-24 MED ORDER — SERTRALINE HCL 100 MG PO TABS: 100.0000 mg | ORAL_TABLET | Freq: Every day | ORAL | 1 refills | Status: DC

## 2017-03-24 MED ORDER — FAMOTIDINE 40 MG PO TABS: 40.0000 mg | ORAL_TABLET | Freq: Every day | ORAL | 3 refills | Status: DC

## 2017-03-24 MED ORDER — NALTREXONE-BUPROPION HCL ER 8-90 MG PO TB12: 2.0000 | ORAL_TABLET | Freq: Two times a day (BID) | ORAL | 2 refills | Status: DC

## 2017-03-29 DIAGNOSIS — J301 Allergic rhinitis due to pollen: Secondary | ICD-10-CM | POA: Diagnosis not present

## 2017-03-30 DIAGNOSIS — J3089 Other allergic rhinitis: Secondary | ICD-10-CM | POA: Diagnosis not present

## 2017-03-30 DIAGNOSIS — J3081 Allergic rhinitis due to animal (cat) (dog) hair and dander: Secondary | ICD-10-CM | POA: Diagnosis not present

## 2017-04-02 DIAGNOSIS — J3089 Other allergic rhinitis: Secondary | ICD-10-CM | POA: Diagnosis not present

## 2017-04-02 DIAGNOSIS — J3081 Allergic rhinitis due to animal (cat) (dog) hair and dander: Secondary | ICD-10-CM | POA: Diagnosis not present

## 2017-04-02 DIAGNOSIS — J301 Allergic rhinitis due to pollen: Secondary | ICD-10-CM | POA: Diagnosis not present

## 2017-04-05 MED FILL — FAMOTIDINE 40 MG TABLET: 40 | 90 days supply | Qty: 90 | Fill #0

## 2017-04-07 MED FILL — AZELASTINE 0.1% (137 MCG) S: 0.1 | 90 days supply | Qty: 60 | Fill #1

## 2017-04-07 MED FILL — FLUTICASONE PROP 50 MCG SPR: 50 | 30 days supply | Qty: 16 | Fill #0

## 2017-04-07 MED FILL — CONTRAVE ER 8-90 MG TABLET: 8-90 | 30 days supply | Qty: 120 | Fill #0

## 2017-04-14 DIAGNOSIS — J301 Allergic rhinitis due to pollen: Secondary | ICD-10-CM | POA: Diagnosis not present

## 2017-04-14 DIAGNOSIS — J3081 Allergic rhinitis due to animal (cat) (dog) hair and dander: Secondary | ICD-10-CM | POA: Diagnosis not present

## 2017-04-14 DIAGNOSIS — J3089 Other allergic rhinitis: Secondary | ICD-10-CM | POA: Diagnosis not present

## 2017-05-03 MED FILL — MONTELUKAST SOD 10 MG TAB: 10 | 90 days supply | Qty: 90 | Fill #2

## 2017-05-03 MED FILL — LEVOCETIRIZINE 5 MG TABLET: 5 | 90 days supply | Qty: 90 | Fill #2

## 2017-05-04 DIAGNOSIS — J3081 Allergic rhinitis due to animal (cat) (dog) hair and dander: Secondary | ICD-10-CM | POA: Diagnosis not present

## 2017-05-04 DIAGNOSIS — J3089 Other allergic rhinitis: Secondary | ICD-10-CM | POA: Diagnosis not present

## 2017-05-04 DIAGNOSIS — J301 Allergic rhinitis due to pollen: Secondary | ICD-10-CM | POA: Diagnosis not present

## 2017-05-07 MED FILL — TOLTERODINE TARTRATE 2 MG T: 2 | 30 days supply | Qty: 60 | Fill #5

## 2017-05-21 ENCOUNTER — Ambulatory Visit
Admission: RE | Admit: 2017-05-21 | Discharge: 2017-05-21 | Disposition: A | Discharge status: Home / Self Care | Payer: 59 | Source: Ambulatory Visit | Attending: Obstetrics and Gynecology | Admitting: Obstetrics and Gynecology

## 2017-05-21 ENCOUNTER — Ambulatory Visit: Payer: Self-pay

## 2017-05-21 DIAGNOSIS — Z1231 Encounter for screening mammogram for malignant neoplasm of breast: Secondary | ICD-10-CM

## 2017-05-26 MED FILL — CONTRAVE ER 8-90 MG TABLET: 8-90 | 30 days supply | Qty: 120 | Fill #1

## 2017-05-27 DIAGNOSIS — J3089 Other allergic rhinitis: Secondary | ICD-10-CM | POA: Diagnosis not present

## 2017-05-27 DIAGNOSIS — J3081 Allergic rhinitis due to animal (cat) (dog) hair and dander: Secondary | ICD-10-CM | POA: Diagnosis not present

## 2017-05-27 DIAGNOSIS — J301 Allergic rhinitis due to pollen: Secondary | ICD-10-CM | POA: Diagnosis not present

## 2017-05-27 MED FILL — SERTRALINE HCL 100 MG TAB: 100 | 90 days supply | Qty: 90 | Fill #0

## 2017-06-10 DIAGNOSIS — J301 Allergic rhinitis due to pollen: Secondary | ICD-10-CM | POA: Diagnosis not present

## 2017-06-10 DIAGNOSIS — J3081 Allergic rhinitis due to animal (cat) (dog) hair and dander: Secondary | ICD-10-CM | POA: Diagnosis not present

## 2017-06-10 DIAGNOSIS — J3089 Other allergic rhinitis: Secondary | ICD-10-CM | POA: Diagnosis not present

## 2017-06-22 DIAGNOSIS — J3089 Other allergic rhinitis: Secondary | ICD-10-CM | POA: Diagnosis not present

## 2017-06-22 DIAGNOSIS — J301 Allergic rhinitis due to pollen: Secondary | ICD-10-CM | POA: Diagnosis not present

## 2017-06-23 DIAGNOSIS — G5601 Carpal tunnel syndrome, right upper limb: Secondary | ICD-10-CM | POA: Diagnosis not present

## 2017-06-23 DIAGNOSIS — G5602 Carpal tunnel syndrome, left upper limb: Secondary | ICD-10-CM | POA: Diagnosis not present

## 2017-06-29 DIAGNOSIS — J301 Allergic rhinitis due to pollen: Secondary | ICD-10-CM | POA: Diagnosis not present

## 2017-06-29 DIAGNOSIS — J3081 Allergic rhinitis due to animal (cat) (dog) hair and dander: Secondary | ICD-10-CM | POA: Diagnosis not present

## 2017-06-29 DIAGNOSIS — J3089 Other allergic rhinitis: Secondary | ICD-10-CM | POA: Diagnosis not present

## 2017-06-30 DIAGNOSIS — Z01419 Encounter for gynecological examination (general) (routine) without abnormal findings: Secondary | ICD-10-CM | POA: Diagnosis not present

## 2017-06-30 DIAGNOSIS — Z130 Encounter for screening for diseases of the blood and blood-forming organs and certain disorders involving the immune mechanism: Secondary | ICD-10-CM | POA: Diagnosis not present

## 2017-06-30 DIAGNOSIS — Z1389 Encounter for screening for other disorder: Secondary | ICD-10-CM | POA: Diagnosis not present

## 2017-07-02 MED FILL — CONTRAVE ER 8-90 MG TABLET: 8-90 | 30 days supply | Qty: 120 | Fill #2

## 2017-07-02 MED FILL — FAMOTIDINE 40 MG TABLET: 40 | 90 days supply | Qty: 90 | Fill #1

## 2017-07-05 MED FILL — TOLTERODINE TARTRATE 2 MG T: 2 | 30 days supply | Qty: 60 | Fill #0

## 2017-07-09 DIAGNOSIS — J3081 Allergic rhinitis due to animal (cat) (dog) hair and dander: Secondary | ICD-10-CM | POA: Diagnosis not present

## 2017-07-09 DIAGNOSIS — J3089 Other allergic rhinitis: Secondary | ICD-10-CM | POA: Diagnosis not present

## 2017-07-09 DIAGNOSIS — J301 Allergic rhinitis due to pollen: Secondary | ICD-10-CM | POA: Diagnosis not present

## 2017-07-20 DIAGNOSIS — J3089 Other allergic rhinitis: Secondary | ICD-10-CM | POA: Diagnosis not present

## 2017-07-20 DIAGNOSIS — J301 Allergic rhinitis due to pollen: Secondary | ICD-10-CM | POA: Diagnosis not present

## 2017-07-20 DIAGNOSIS — J3081 Allergic rhinitis due to animal (cat) (dog) hair and dander: Secondary | ICD-10-CM | POA: Diagnosis not present

## 2017-07-25 ENCOUNTER — Telehealth: Payer: 59 | Admitting: Physician Assistant

## 2017-07-25 DIAGNOSIS — J329 Chronic sinusitis, unspecified: Secondary | ICD-10-CM

## 2017-07-25 DIAGNOSIS — B9789 Other viral agents as the cause of diseases classified elsewhere: Secondary | ICD-10-CM

## 2017-07-25 MED ORDER — AZELASTINE HCL 0.1 % NA SOLN: 2.0000 | Freq: Two times a day (BID) | NASAL | 0 refills | Status: DC

## 2017-07-25 NOTE — Progress Notes (Signed)

## 2017-07-26 ENCOUNTER — Ambulatory Visit (INDEPENDENT_AMBULATORY_CARE_PROVIDER_SITE_OTHER): Payer: Self-pay | Admitting: Family Medicine

## 2017-07-26 ENCOUNTER — Encounter: Payer: Self-pay | Admitting: Family Medicine

## 2017-07-26 VITALS — BP 108/82 | HR 99 | Temp 98.2°F | Resp 20 | Wt 219.4 lb

## 2017-07-26 DIAGNOSIS — J069 Acute upper respiratory infection, unspecified: Secondary | ICD-10-CM

## 2017-07-26 DIAGNOSIS — J040 Acute laryngitis: Secondary | ICD-10-CM

## 2017-07-26 MED ORDER — PREDNISONE 20 MG PO TABS: 40.0000 mg | ORAL_TABLET | Freq: Every day | ORAL | 0 refills | Status: AC

## 2017-07-26 MED ORDER — BENZONATATE 100 MG PO CAPS: 100.0000 mg | ORAL_CAPSULE | Freq: Three times a day (TID) | ORAL | 0 refills | Status: DC | PRN

## 2017-07-26 MED ORDER — PREDNISONE 20 MG PO TABS: 40.0000 mg | ORAL_TABLET | Freq: Every day | ORAL | 0 refills | Status: DC

## 2017-07-26 MED ORDER — BENZONATATE 100 MG PO CAPS
100.0000 mg | ORAL_CAPSULE | Freq: Three times a day (TID) | ORAL | 0 refills | Status: DC | PRN
Start: 2017-07-26 — End: 2017-07-26

## 2017-07-26 MED FILL — predniSONE 20 MG TABS: 20 | 5 days supply | Qty: 10 | Fill #0

## 2017-07-26 MED FILL — BENZONATATE 100 MG CAP: 100 | 5 days supply | Qty: 30 | Fill #0

## 2017-07-26 NOTE — Progress Notes (Signed)
Orders sent for Prednisone and Tessalon Perles for Shella Maxim, FNP-C.  User does not access to e-prescribe medications at this time.

## 2017-07-26 NOTE — Progress Notes (Signed)
Michelle Griffin is a  47 y.o. female who is here for loss of voice and complaints of cough and congestion for 5 days. She is chronically managed with nasal sprays, oral antihistamines and daily singulair for allergies and asthma. She denies having to use her albuterol inhaler in the last 2 weeks for this episode of symptoms and reports that her condition is under control without recent exacerbation. She denies chest pain or shortness of breath at this time. She has been treated via e-visit in the last 48 hours for similar symptoms with out great improvement.   Review of Systems  Constitutional: Negative for chills, diaphoresis, fever, malaise/fatigue and weight loss.  HENT: Positive for congestion and sinus pain. Negative for ear discharge, ear pain, hearing loss, nosebleeds, sore throat and tinnitus.   Eyes: Negative.   Respiratory: Positive for cough. Negative for hemoptysis, sputum production, shortness of breath, wheezing and stridor.   Cardiovascular: Negative for chest pain, palpitations, orthopnea and claudication.  Gastrointestinal: Negative.   Genitourinary: Negative.   Musculoskeletal: Negative.   Skin: Negative for itching and rash.  Neurological: Negative for dizziness, weakness and headaches.  Endo/Heme/Allergies: Negative.   Psychiatric/Behavioral: Negative.    Physical Exam  Constitutional: Vital signs are normal. She appears well-developed and well-nourished. No distress.  HENT:  Head: Normocephalic and atraumatic.  Right Ear: Hearing, tympanic membrane, external ear and ear canal normal.  Left Ear: Hearing, tympanic membrane, external ear and ear canal normal.  Nose: Rhinorrhea present. No mucosal edema, nose lacerations, sinus tenderness or nasal deformity.  Mouth/Throat: Uvula is midline, oropharynx is clear and moist and mucous membranes are normal.  Cardiovascular: Normal rate.  Pulmonary/Chest: Effort normal and breath sounds normal. No apnea and no tachypnea. No  respiratory distress.  Abdominal: Soft. Bowel sounds are normal.  Neurological: She is alert.  Skin: Skin is warm and dry. She is not diaphoretic.  Patient appears fatigued with forceful barky cough on exam, cough is moist but non-productive during visit.  A: 1. Upper respiratory tract infection, unspecified type   2. Acute laryngitis     P:  1. Upper respiratory tract infection, unspecified type  2. Acute laryngitis  Meds ordered this encounter  Medications  . DISCONTD: benzonatate (TESSALON) 100 MG capsule    Sig: Take 1-2 capsules (100-200 mg total) by mouth 3 (three) times daily as needed for cough.    Dispense:  30 capsule    Refill:  0  . DISCONTD: predniSONE (DELTASONE) 20 MG tablet    Sig: Take 2 tablets (40 mg total) by mouth daily with breakfast for 5 days.    Dispense:  10 tablet    Refill:  0  . predniSONE (DELTASONE) 20 MG tablet    Sig: Take 2 tablets (40 mg total) by mouth daily with breakfast for 5 days.    Dispense:  10 tablet    Refill:  0  . benzonatate (TESSALON) 100 MG capsule    Sig: Take 1-2 capsules (100-200 mg total) by mouth 3 (three) times daily as needed for cough.    Dispense:  30 capsule    Refill:  0   PLAN< Continue chronic medications for asthma and allergies Cough: Add tessalon pearls 1-2 capsules with full glass of water every 8 hours as needed up to three times a day Take: Short course steroid burst 40 mg/day for 5 days to help with inflammation Follow up with PCP if symptoms do not improve or persist; utilize ED if symptoms become emergent

## 2017-07-26 NOTE — Patient Instructions (Signed)
PLAN< Continue chronic medications for asthma and allergies Cough: Add tessalon pearls 1-2 capsules with full glass of water every 8 hours as needed up to three times a day Take: Short course steroid burst 40 mg/day for 5 days to help with inflammation Follow up with PCP if symptoms do not improve or persist or become emergent Cough, Adult A cough helps to clear your throat and lungs. A cough may last only 2-3 weeks (acute), or it may last longer than 8 weeks (chronic). Many different things can cause a cough. A cough may be a sign of an illness or another medical condition. Follow these instructions at home:  Pay attention to any changes in your cough.  Take medicines only as told by your doctor. ? If you were prescribed an antibiotic medicine, take it as told by your doctor. Do not stop taking it even if you start to feel better. ? Talk with your doctor before you try using a cough medicine.  Drink enough fluid to keep your pee (urine) clear or pale yellow.  If the air is dry, use a cold steam vaporizer or humidifier in your home.  Stay away from things that make you cough at work or at home.  If your cough is worse at night, try using extra pillows to raise your head up higher while you sleep.  Do not smoke, and try not to be around smoke. If you need help quitting, ask your doctor.  Do not have caffeine.  Do not drink alcohol.  Rest as needed. Contact a doctor if:  You have new problems (symptoms).  You cough up yellow fluid (pus).  Your cough does not get better after 2-3 weeks, or your cough gets worse.  Medicine does not help your cough and you are not sleeping well.  You have pain that gets worse or pain that is not helped with medicine.  You have a fever.  You are losing weight and you do not know why.  You have night sweats. Get help right away if:  You cough up blood.  You have trouble breathing.  Your heartbeat is very fast. This information is not  intended to replace advice given to you by your health care provider. Make sure you discuss any questions you have with your health care provider. Document Released: 01/29/2011 Document Revised: 10/24/2015 Document Reviewed: 07/25/2014 Elsevier Interactive Patient Education  2018 [employer].  Hoarseness Hoarseness is any abnormal change in your voice.Hoarseness can make it difficult to speak. Your voice may sound raspy, breathy, or strained. Hoarseness is caused by a problem with the vocal cords. The vocal cords are two bands of tissue inside your voice box (larynx). When you speak, your vocal cords move back and forth to create sound. The surfaces of your vocal cords need to be smooth for your voice to sound clear. Swelling or lumps on the vocal cords can cause hoarseness. Common causes of vocal cord problems include:  Upper airway infection.  A long-term cough.  Straining or overusing your voice.  Smoking.  Allergies.  Vocal cord growths.  Stomach acids that flow up from your stomach and irritate your vocal cords (gastroesophageal reflux).  Follow these instructions at home: Watch your condition for any changes. To ease any discomfort that you feel:  Rest your voice. Do not whisper. Whispering can cause muscle strain.  Do not speak in a loud or harsh voice that makes your hoarseness worse.  Do not use any tobacco products, including cigarettes,  chewing tobacco, or electronic cigarettes. If you need help quitting, ask your health care provider.  Avoid secondhand smoke.  Do not eat foods that give you heartburn. Heartburn can make gastroesophageal reflux worse.  Do not drink coffee.  Do not drink alcohol.  Drink enough fluids to keep your urine clear or pale yellow.  Use a humidifier if the air in your home is dry.  Contact a health care provider if:  You have hoarseness that lasts longer than 3 weeks.  You almost lose or completelylose your voice for longer than  3 days.  You have pain when you swallow or try to talk.  You feel a lump in your neck. Get help right away if:  You have trouble swallowing.  You feel as though you are choking when you swallow.  You cough up blood or vomit blood.  You have trouble breathing. This information is not intended to replace advice given to you by your health care provider. Make sure you discuss any questions you have with your health care provider. Document Released: 05/01/2005 Document Revised: 10/24/2015 Document Reviewed: 05/09/2014 Elsevier Interactive Patient Education  2018 [employer].  Upper Respiratory Infection, Adult Most upper respiratory infections (URIs) are caused by a virus. A URI affects the nose, throat, and upper air passages. The most common type of URI is often called "the common cold." Follow these instructions at home:  Take medicines only as told by your doctor.  Gargle warm saltwater or take cough drops to comfort your throat as told by your doctor.  Use a warm mist humidifier or inhale steam from a shower to increase air moisture. This may make it easier to breathe.  Drink enough fluid to keep your pee (urine) clear or pale yellow.  Eat soups and other clear broths.  Have a healthy diet.  Rest as needed.  Go back to work when your fever is gone or your doctor says it is okay. ? You may need to stay home longer to avoid giving your URI to others. ? You can also wear a face mask and wash your hands often to prevent spread of the virus.  Use your inhaler more if you have asthma.  Do not use any tobacco products, including cigarettes, chewing tobacco, or electronic cigarettes. If you need help quitting, ask your doctor. Contact a doctor if:  You are getting worse, not better.  Your symptoms are not helped by medicine.  You have chills.  You are getting more short of breath.  You have brown or red mucus.  You have yellow or brown discharge from your  nose.  You have pain in your face, especially when you bend forward.  You have a fever.  You have puffy (swollen) neck glands.  You have pain while swallowing.  You have white areas in the back of your throat. Get help right away if:  You have very bad or constant: ? Headache. ? Ear pain. ? Pain in your forehead, behind your eyes, and over your cheekbones (sinus pain). ? Chest pain.  You have long-lasting (chronic) lung disease and any of the following: ? Wheezing. ? Long-lasting cough. ? Coughing up blood. ? A change in your usual mucus.  You have a stiff neck.  You have changes in your: ? Vision. ? Hearing. ? Thinking. ? Mood. This information is not intended to replace advice given to you by your health care provider. Make sure you discuss any questions you have with your health care  provider. Document Released: 11/04/2007 Document Revised: 01/19/2016 Document Reviewed: 08/23/2013 Elsevier Interactive Patient Education  2018 [employer].

## 2017-07-27 ENCOUNTER — Telehealth: Payer: Self-pay

## 2017-07-27 NOTE — Telephone Encounter (Signed)
Pt husband called stating that pt had text him requesting him to call us to see if the provider that saw pt yesterday would Rx a Z-pak. I informed the husband that the provider wasn't here today and that I would call to see if this could be done. I called and spoke with both Adonis Brook and Portal and they stated that a Z-pak nor antibiotic was not warranted at this time. They advised that pt continue to take the prednisone that was Rx'd yesterday along with the cough pearls and do salt water gargles to help with her voice. Also, if her symptoms persist or get worse to follow up with her PCP. Pt husband voiced understanding.

## 2017-07-28 ENCOUNTER — Ambulatory Visit: Payer: 59 | Admitting: Family Medicine

## 2017-07-28 ENCOUNTER — Encounter: Payer: Self-pay | Admitting: Family Medicine

## 2017-07-28 VITALS — BP 120/70 | HR 84 | Temp 98.5°F | Ht 70.0 in | Wt 219.4 lb

## 2017-07-28 DIAGNOSIS — J029 Acute pharyngitis, unspecified: Secondary | ICD-10-CM | POA: Diagnosis not present

## 2017-07-28 DIAGNOSIS — J0140 Acute pansinusitis, unspecified: Secondary | ICD-10-CM

## 2017-07-28 LAB — POCT RAPID STREP A (OFFICE): Rapid Strep A Screen: NEGATIVE

## 2017-07-28 MED ORDER — FLUCONAZOLE 150 MG PO TABS: 150.0000 mg | ORAL_TABLET | Freq: Once | ORAL | 0 refills | Status: AC

## 2017-07-28 MED ORDER — AMOXICILLIN 875 MG PO TABS: 875.0000 mg | ORAL_TABLET | Freq: Two times a day (BID) | ORAL | 0 refills | Status: DC

## 2017-07-28 MED FILL — AMOXICILLIN 875 MG TABLET: 875 | 10 days supply | Qty: 20 | Fill #0

## 2017-07-28 MED FILL — FLUCONAZOLE 150 MG TABLET: 150 | 1 days supply | Qty: 1 | Fill #0

## 2017-07-28 NOTE — Progress Notes (Signed)
Chief Complaint  Patient presents with  . Cough    symptoms started Sunday and went to [facility] Monday and still not feeling well. ST and cough-they put her on prednisone and tessalon. Mucus is green and throat is still sore. No feveror body aches but some chills, fatigued.     Subjective:  Michelle Griffin is a 47 y.o. female who presents for a one week history of sinus pain, sore throat, headache. States symptoms have worsened and now having hoarseness, right ear discomfort and frontal headache.   Has a history of underlying allergies. Takes Singulair and gets allergy shots.   States a couple of days ago she did an [employer], jewellery and was told to use nasal spray.  States she also went to [place] on Monday and was started prednisone 40 mg x 5 days.  Is also taking Tessalon. States she thinks she needs an antibiotic and was not prescribed one.   Does not smoke.  Reports history of recurrent sinus infections but none lately. States she declined CT sinuses and seeing an ENT in the past.  She does see her allergy specialist.   Denies fever, body aches, chest pain, palpitations, shortness of breath, wheezing, abdominal pain, N/V/D, LE edema.   Treatment to date: antihistamines, cough suppressants, decongestants and prednisone.  Denies sick contacts.  No other aggravating or relieving factors.  No other c/o.  ROS as in subjective.   Objective: Vitals:   07/28/17 1128  BP: 120/70  Pulse: 84  Temp: 98.5 F (36.9 C)    General appearance: Alert, WD/WN, no distress, mildly ill appearing                             Skin: warm, no rash                           Head: frontal and maxillary sinus tenderness                            Eyes: conjunctiva normal, corneas clear, PERRLA                            Ears: pearly TMs, external ear canals normal                          Nose: septum midline, turbinates swollen, with erythema and thick, yellowish discharge             Mouth/throat: MMM, tongue  normal, mild pharyngeal erythema                           Neck: supple, no adenopathy, no thyromegaly, nontender                          Heart: RRR, normal S1, S2, no murmurs                         Lungs: CTA bilaterally, no wheezes, rales, or rhonchi      Assessment: Acute pansinusitis, recurrence not specified - Plan: amoxicillin (AMOXIL) 875 MG tablet  Sore throat - Plan: Rapid Strep A    Plan: Negative strep test.  Discussed diagnosis and treatment of acute sinusitis. Amoxil  prescribed.   Suggested symptomatic OTC remedies.Nasal saline spray for congestion, saline nasal flush, salt water gargles.  Tylenol or Ibuprofen OTC for fever and malaise.  Call/return if worsening or not back to baseline after completing the antibiotic.

## 2017-08-02 MED FILL — MONTELUKAST SOD 10 MG TAB: 10 | 90 days supply | Qty: 90 | Fill #0

## 2017-08-02 MED FILL — LEVOCETIRIZINE 5 MG TABLET: 5 | 90 days supply | Qty: 90 | Fill #0

## 2017-08-15 MED FILL — TOLTERODINE TARTRATE 2 MG T: 2 | 30 days supply | Qty: 60 | Fill #1

## 2017-08-16 ENCOUNTER — Other Ambulatory Visit: Payer: Self-pay | Admitting: Medical

## 2017-08-16 DIAGNOSIS — J3089 Other allergic rhinitis: Secondary | ICD-10-CM | POA: Diagnosis not present

## 2017-08-16 DIAGNOSIS — J301 Allergic rhinitis due to pollen: Secondary | ICD-10-CM | POA: Diagnosis not present

## 2017-08-16 DIAGNOSIS — J3081 Allergic rhinitis due to animal (cat) (dog) hair and dander: Secondary | ICD-10-CM | POA: Diagnosis not present

## 2017-08-16 NOTE — Telephone Encounter (Signed)
LOV with you-03/23/17 Please advise if okay to refill? Thank you!

## 2017-08-20 DIAGNOSIS — J3081 Allergic rhinitis due to animal (cat) (dog) hair and dander: Secondary | ICD-10-CM | POA: Diagnosis not present

## 2017-08-20 DIAGNOSIS — J3089 Other allergic rhinitis: Secondary | ICD-10-CM | POA: Diagnosis not present

## 2017-08-20 DIAGNOSIS — J301 Allergic rhinitis due to pollen: Secondary | ICD-10-CM | POA: Diagnosis not present

## 2017-08-20 MED FILL — FLUTICASONE PROP 50 MCG SPR: 50 | 30 days supply | Qty: 16 | Fill #1

## 2017-08-20 MED FILL — AZELASTINE HCL 137 MCG/SPRA: 137 | 90 days supply | Qty: 60 | Fill #2

## 2017-08-21 ENCOUNTER — Telehealth: Payer: Self-pay | Admitting: Medical

## 2017-08-21 NOTE — Telephone Encounter (Signed)
P.A. CONTRAVE  

## 2017-08-23 MED FILL — SERTRALINE HCL 100 MG TAB: 100 | 90 days supply | Qty: 90 | Fill #1

## 2017-08-24 DIAGNOSIS — J301 Allergic rhinitis due to pollen: Secondary | ICD-10-CM | POA: Diagnosis not present

## 2017-08-24 DIAGNOSIS — J3089 Other allergic rhinitis: Secondary | ICD-10-CM | POA: Diagnosis not present

## 2017-08-24 DIAGNOSIS — J3081 Allergic rhinitis due to animal (cat) (dog) hair and dander: Secondary | ICD-10-CM | POA: Diagnosis not present

## 2017-08-26 MED FILL — CONTRAVE ER 8-90 MG TABLET: 8-90 | 30 days supply | Qty: 120 | Fill #0

## 2017-08-30 NOTE — Telephone Encounter (Signed)
P.A. Approved til 08/25/18, pt & pharmacy informed

## 2017-09-03 DIAGNOSIS — J301 Allergic rhinitis due to pollen: Secondary | ICD-10-CM | POA: Diagnosis not present

## 2017-09-03 DIAGNOSIS — J3089 Other allergic rhinitis: Secondary | ICD-10-CM | POA: Diagnosis not present

## 2017-09-03 DIAGNOSIS — J3081 Allergic rhinitis due to animal (cat) (dog) hair and dander: Secondary | ICD-10-CM | POA: Diagnosis not present

## 2017-09-10 DIAGNOSIS — J301 Allergic rhinitis due to pollen: Secondary | ICD-10-CM | POA: Diagnosis not present

## 2017-09-10 DIAGNOSIS — J3081 Allergic rhinitis due to animal (cat) (dog) hair and dander: Secondary | ICD-10-CM | POA: Diagnosis not present

## 2017-09-10 DIAGNOSIS — J3089 Other allergic rhinitis: Secondary | ICD-10-CM | POA: Diagnosis not present

## 2017-09-16 DIAGNOSIS — J301 Allergic rhinitis due to pollen: Secondary | ICD-10-CM | POA: Diagnosis not present

## 2017-09-16 DIAGNOSIS — J3089 Other allergic rhinitis: Secondary | ICD-10-CM | POA: Diagnosis not present

## 2017-09-16 DIAGNOSIS — J3081 Allergic rhinitis due to animal (cat) (dog) hair and dander: Secondary | ICD-10-CM | POA: Diagnosis not present

## 2017-10-01 DIAGNOSIS — J3089 Other allergic rhinitis: Secondary | ICD-10-CM | POA: Diagnosis not present

## 2017-10-01 DIAGNOSIS — J301 Allergic rhinitis due to pollen: Secondary | ICD-10-CM | POA: Diagnosis not present

## 2017-10-01 DIAGNOSIS — J3081 Allergic rhinitis due to animal (cat) (dog) hair and dander: Secondary | ICD-10-CM | POA: Diagnosis not present

## 2017-10-05 MED FILL — FAMOTIDINE 40 MG TABLET: 40 | 90 days supply | Qty: 90 | Fill #2

## 2017-10-06 DIAGNOSIS — J301 Allergic rhinitis due to pollen: Secondary | ICD-10-CM | POA: Diagnosis not present

## 2017-10-06 DIAGNOSIS — J3089 Other allergic rhinitis: Secondary | ICD-10-CM | POA: Diagnosis not present

## 2017-10-06 DIAGNOSIS — J3081 Allergic rhinitis due to animal (cat) (dog) hair and dander: Secondary | ICD-10-CM | POA: Diagnosis not present

## 2017-10-07 ENCOUNTER — Telehealth: Payer: 59 | Admitting: Family Medicine

## 2017-10-07 ENCOUNTER — Other Ambulatory Visit: Payer: Self-pay | Admitting: Medical

## 2017-10-07 DIAGNOSIS — J329 Chronic sinusitis, unspecified: Secondary | ICD-10-CM | POA: Diagnosis not present

## 2017-10-07 MED ORDER — AMOXICILLIN-POT CLAVULANATE 875-125 MG PO TABS: 1.0000 | ORAL_TABLET | Freq: Two times a day (BID) | ORAL | 0 refills | Status: DC

## 2017-10-07 MED FILL — AMOX TR-K CLV 875-125 MG TA: 875-125 | 10 days supply | Qty: 20 | Fill #0

## 2017-10-07 MED FILL — CONTRAVE ER 8-90 MG TABLET: 8-90 | 30 days supply | Qty: 120 | Fill #1

## 2017-10-07 NOTE — Progress Notes (Signed)
We are sorry that you are not feeling well.  Here is how we plan to help!  Based on what you have shared with me it looks like you have sinusitis.  Sinusitis is inflammation and infection in the sinus cavities of the head.   Based on the length of symptoms it is to early to tell if this is a viral or bacterial infection. Infections are typically identified as bacterial with fever and length of symptoms. I will treat you as if this was a bacterial infection.  Today, I have prescribed Augmentin 875mg /125mg  one tablet twice daily with food, for 7 days. You may use an oral decongestant such as Mucinex D or if you have glaucoma or high blood pressure use plain Mucinex. Saline nasal spray help and can safely be used as often as needed for congestion.  If you develop worsening sinus pain, fever or notice severe headache and vision changes, or if symptoms are not better after completion of antibiotic, please schedule an appointment with a health care provider.    Sinus infections are not as easily transmitted as other respiratory infection, however we still recommend that you avoid close contact with loved ones, especially the very young and elderly.  Remember to wash your hands thoroughly throughout the day as this is the number one way to prevent the spread of infection!  Home Care:  Only take medications as instructed by your medical team.  Complete the entire course of an antibiotic.  Do not take these medications with alcohol.  A steam or ultrasonic humidifier can help congestion.  You can place a towel over your head and breathe in the steam from hot water coming from a faucet.  Avoid close contacts especially the very young and the elderly.  Cover your mouth when you cough or sneeze.  Always remember to wash your hands.  Get Help Right Away If:  You develop worsening fever or sinus pain.  You develop a severe head ache or visual changes.  Your symptoms persist after you have completed  your treatment plan.  Make sure you  Understand these instructions.  Will watch your condition.  Will get help right away if you are not doing well or get worse.  Your e-visit answers were reviewed by a board certified advanced clinical practitioner to complete your personal care plan.  Depending on the condition, your plan could have included both over the counter or prescription medications.  If there is a problem please reply  once you have received a response from your provider.  Your safety is important to [name].  If you have drug allergies check your prescription carefully.    You can use MyChart to ask questions about today's visit, request a non-urgent call back, or ask for a work or school excuse for 24 hours related to this e-Visit. If it has been greater than 24 hours you will need to follow up with your provider, or enter a new e-Visit to address those concerns.  You will get an e-mail in the next two days asking about your experience.  I hope that your e-visit has been valuable and will speed your recovery. Thank you for using e-visits.

## 2017-10-12 DIAGNOSIS — J3081 Allergic rhinitis due to animal (cat) (dog) hair and dander: Secondary | ICD-10-CM | POA: Diagnosis not present

## 2017-10-12 DIAGNOSIS — J301 Allergic rhinitis due to pollen: Secondary | ICD-10-CM | POA: Diagnosis not present

## 2017-10-12 DIAGNOSIS — J3089 Other allergic rhinitis: Secondary | ICD-10-CM | POA: Diagnosis not present

## 2017-10-18 DIAGNOSIS — J3081 Allergic rhinitis due to animal (cat) (dog) hair and dander: Secondary | ICD-10-CM | POA: Diagnosis not present

## 2017-10-18 DIAGNOSIS — J3089 Other allergic rhinitis: Secondary | ICD-10-CM | POA: Diagnosis not present

## 2017-10-18 DIAGNOSIS — J301 Allergic rhinitis due to pollen: Secondary | ICD-10-CM | POA: Diagnosis not present

## 2017-10-20 ENCOUNTER — Other Ambulatory Visit (INDEPENDENT_AMBULATORY_CARE_PROVIDER_SITE_OTHER): Payer: Self-pay | Admitting: Orthopedic Surgery

## 2017-10-20 MED FILL — NITROGLYCERIN 0.2 MG/HR PTC: 0.2 | 30 days supply | Qty: 30 | Fill #0

## 2017-10-26 MED FILL — TOLTERODINE TARTRATE 2 MG T: 2 | 30 days supply | Qty: 60 | Fill #2

## 2017-10-28 ENCOUNTER — Encounter (INDEPENDENT_AMBULATORY_CARE_PROVIDER_SITE_OTHER): Payer: 59

## 2017-10-29 DIAGNOSIS — J3081 Allergic rhinitis due to animal (cat) (dog) hair and dander: Secondary | ICD-10-CM | POA: Diagnosis not present

## 2017-10-29 DIAGNOSIS — J3089 Other allergic rhinitis: Secondary | ICD-10-CM | POA: Diagnosis not present

## 2017-10-29 DIAGNOSIS — J301 Allergic rhinitis due to pollen: Secondary | ICD-10-CM | POA: Diagnosis not present

## 2017-11-03 ENCOUNTER — Encounter (INDEPENDENT_AMBULATORY_CARE_PROVIDER_SITE_OTHER): Payer: Self-pay | Admitting: Family Medicine

## 2017-11-03 ENCOUNTER — Ambulatory Visit (INDEPENDENT_AMBULATORY_CARE_PROVIDER_SITE_OTHER): Payer: 59 | Admitting: Family Medicine

## 2017-11-03 VITALS — BP 114/73 | HR 93 | Temp 97.9°F | Ht 70.0 in | Wt 211.0 lb

## 2017-11-03 DIAGNOSIS — R0602 Shortness of breath: Secondary | ICD-10-CM | POA: Diagnosis not present

## 2017-11-03 DIAGNOSIS — Z6830 Body mass index (BMI) 30.0-30.9, adult: Secondary | ICD-10-CM

## 2017-11-03 DIAGNOSIS — E669 Obesity, unspecified: Secondary | ICD-10-CM

## 2017-11-03 DIAGNOSIS — E559 Vitamin D deficiency, unspecified: Secondary | ICD-10-CM

## 2017-11-03 DIAGNOSIS — Z1331 Encounter for screening for depression: Secondary | ICD-10-CM | POA: Diagnosis not present

## 2017-11-03 DIAGNOSIS — Z9189 Other specified personal risk factors, not elsewhere classified: Secondary | ICD-10-CM

## 2017-11-03 DIAGNOSIS — J3089 Other allergic rhinitis: Secondary | ICD-10-CM | POA: Diagnosis not present

## 2017-11-03 DIAGNOSIS — J301 Allergic rhinitis due to pollen: Secondary | ICD-10-CM | POA: Diagnosis not present

## 2017-11-03 DIAGNOSIS — R5383 Other fatigue: Secondary | ICD-10-CM | POA: Diagnosis not present

## 2017-11-03 DIAGNOSIS — J3081 Allergic rhinitis due to animal (cat) (dog) hair and dander: Secondary | ICD-10-CM | POA: Diagnosis not present

## 2017-11-03 DIAGNOSIS — Z0289 Encounter for other administrative examinations: Secondary | ICD-10-CM

## 2017-11-03 MED FILL — LEVOCETIRIZINE 5 MG TABLET: 5 | 90 days supply | Qty: 90 | Fill #0

## 2017-11-03 MED FILL — MONTELUKAST SOD 10 MG TAB: 10 | 90 days supply | Qty: 90 | Fill #0

## 2017-11-03 NOTE — Progress Notes (Signed)
.  Office: [phone]  /  Fax: [phone]   HPI:   Chief Complaint: OBESITY  Michelle Griffin (MR# [id number]) is a 47 y.o. female who presents on 11/03/2017 for obesity evaluation and treatment. Current BMI is Body mass index is 30.28 kg/m.Michelle Griffin Rhyen has struggled with obesity for years and has been unsuccessful in either losing weight or maintaining long term weight loss. Michelle Griffin attended our information session and states she is currently in the action stage of change and ready to dedicate time achieving and maintaining a healthier weight.  Michelle Griffin heard about our clinic from co-workers.   Michelle Griffin states her family eats meals together she thinks her family will eat healthier with  her her desired weight loss is 56 lbs she started gaining weight around age 74 her heaviest weight ever was 236 lbs she has significant food cravings issues  she skips meals frequently she is frequently drinking liquids with calories   Fatigue Michelle Griffin feels her energy is lower than it should be. This has worsened with weight gain and has not worsened recently. Michelle Griffin admits to daytime somnolence and  admits to waking up still tired. Patient is at risk for obstructive sleep apnea. Patent has a history of symptoms of daytime fatigue and morning headache. Patient generally gets 7 hours of sleep per night, and states they generally have nightime awakenings. Snoring is present. Apneic episodes are present. Epworth Sleepiness Score is 8.  Dyspnea on exertion Michelle Griffin notes increasing shortness of breath with exercising and seems to be worsening over time with weight gain. She notes getting out of breath sooner with activity than she used to. This has not gotten worse recently. EKG-RBBB Michelle Griffin denies orthopnea.  Vitamin D Deficiency Michelle Griffin has a diagnosis of vitamin D deficiency. She has a history of Vit D deficiency, not on supplementation. She denies nausea, vomiting or muscle weakness.  At risk for osteopenia and  osteoporosis [name] is at higher risk of osteopenia and osteoporosis due to vitamin D deficiency.   Depression Screen Michelle Griffin's Food and Mood (modified PHQ-9) score was  Depression screen PHQ 2/9 11/03/2017  Decreased Interest 1  Down, Depressed, Hopeless 0  PHQ - 2 Score 1  Altered sleeping 2  Tired, decreased energy 2  Change in appetite 1  Feeling bad or failure about yourself  0  Trouble concentrating 0  Moving slowly or fidgety/restless 0  Suicidal thoughts 0  PHQ-9 Score 6  Difficult doing work/chores Not difficult at all    ALLERGIES: Allergies  Allergen Reactions  . Sulfa Antibiotics Hives    MEDICATIONS: Current Outpatient Medications on File Prior to Visit  Medication Sig Dispense Refill  . azelastine (ASTELIN) 0.1 % nasal spray Place 2 sprays into both nostrils 2 (two) times daily. Use in each nostril as directed 30 mL 0  . CONTRAVE 8-90 MG TB12 TAKE 2 TABLETS BY MOUTH 2 (TWO) TIMES DAILY. 120 tablet 2  . EPINEPHrine (EPI-PEN) 0.3 mg/0.3 mL SOAJ injection Inject 0.3 mg into the muscle once.    Michelle Griffin esomeprazole (NEXIUM) 40 MG capsule Take 40 mg by mouth daily at 12 noon.    . famotidine (PEPCID) 40 MG tablet Take 1 tablet (40 mg total) by mouth daily. 90 tablet 3  . fluticasone (FLONASE) 50 MCG/ACT nasal spray Place into both nostrils daily.    Michelle Griffin ibuprofen (ADVIL,MOTRIN) 200 MG tablet Take 600 mg by mouth every 4 (four) hours as needed.    . Lactobacillus (PROBIOTIC ACIDOPHILUS) CAPS Take 1 capsule by mouth  daily.    . levocetirizine (XYZAL) 5 MG tablet Take 5 mg by mouth every evening.    . Melatonin 5 MG CAPS Take by mouth.    . methocarbamol (ROBAXIN) 500 MG tablet Take 1 tablet (500 mg total) by mouth 3 (three) times daily. 20 tablet 0  . montelukast (SINGULAIR) 10 MG tablet Take 10 mg by mouth at bedtime.    . Multiple Vitamins-Minerals (MULTIVITAMIN WITH MINERALS) tablet Take 1 tablet by mouth daily.    . nitroGLYCERIN (NITRODUR - DOSED IN MG/24 HR) 0.2 mg/hr patch  PLACE 1 PATCH (0.2 MG TOTAL) ONTO THE SKIN DAILY. 30 patch 11  . predniSONE (DELTASONE) 10 MG tablet Take 10 mg by mouth daily with breakfast.    . sertraline (ZOLOFT) 100 MG tablet TAKE 1 TABLET BY MOUTH ONCE DAILY 90 tablet 1  . tolterodine (DETROL) 2 MG tablet Take 2 mg by mouth at bedtime.     Michelle Griffin albuterol (PROVENTIL HFA;VENTOLIN HFA) 108 (90 BASE) MCG/ACT inhaler Inhale 2 puffs into the lungs every 6 (six) hours as needed for wheezing or shortness of breath. (Patient not taking: Reported on 07/28/2017) 1 Inhaler 0   No current facility-administered medications on file prior to visit.     PAST MEDICAL HISTORY: Past Medical History:  Diagnosis Date  . Allergy    sees [name]  . Anemia    hx/o iron deficiency due to heavy periods  . Asthma   . Bursitis   . DDD (degenerative disc disease), lumbar   . Fatty liver   . GERD (gastroesophageal reflux disease)    [name]  . H/O mammogram   . Hiatal hernia    small per EGD, [name]  . Hyperlipidemia 2012   was on pravastatin prior  . Migraine    prior eval with neurologist, Dr. Melton Alar  . Overactive bladder   . PMDD (premenstrual dysphoric disorder)   . Pyelonephritis    age 27yo  . Routine gynecological examination    [place], [name], NP  . Uterine infection 07/2015   s/p novasure ablation  . Vitamin D deficiency   . Wears glasses     PAST SURGICAL HISTORY: Past Surgical History:  Procedure Laterality Date  . CHOLECYSTECTOMY    . COLONOSCOPY  2014   repeat 5 years; [name], baseline  . ESOPHAGOGASTRODUODENOSCOPY  2014   [name]  . FOOT SURGERY Left    morton's neuroma  . LASIK     [name]  . LUMBAR DISC SURGERY  07/2013   due to herniated disc, [name]  . NOVASURE ABLATION    . WISDOM TOOTH EXTRACTION      SOCIAL HISTORY: Social History   Tobacco Use  . Smoking status: Never Smoker  . Smokeless tobacco: Never Used  Substance Use Topics  . Alcohol use: Yes     Alcohol/week: 1.8 oz    Types: 3 Cans of beer per week    Comment: socially  . Drug use: No    FAMILY HISTORY: Family History  Problem Relation Age of Onset  . Lupus Mother   . Hypertension Mother   . Hyperlipidemia Mother   . Migraines Mother   . Kidney disease Mother   . Hypertension Father   . Diabetes Father   . Hyperlipidemia Father   . Depression Father   . Anxiety disorder Father   . Obesity Sister   . Hypertension Sister   . Diabetes Sister   . Heart disease  Paternal Uncle        CABG  . Hyperlipidemia Paternal Uncle   . Cancer Paternal Uncle        multiple with colon cancer  . Cancer Cousin        breast  . Cancer Cousin        ovarian  . Ovarian cysts Daughter   . Stroke Neg Hx     ROS: Review of Systems  Constitutional: Positive for malaise/fatigue. Negative for weight loss.       + Trouble sleeping  HENT: Positive for nosebleeds, sinus pain and tinnitus.        + Decreased hearing + Hay fever + Difficulty swallowing + Hoarseness  Respiratory: Positive for cough and shortness of breath (with exertion).   Cardiovascular: Negative for orthopnea.       + Leg cramping + Very cold feet  Gastrointestinal: Positive for constipation, heartburn (occasional) and nausea (occasional). Negative for vomiting.  Genitourinary: Positive for frequency.  Musculoskeletal: Positive for back pain.       Negative muscle weakness + Muscle or joint pain (right knee)  Neurological: Positive for headaches.  Endo/Heme/Allergies:       + Heat/cold intolerance  Psychiatric/Behavioral:       + Stress    PHYSICAL EXAM: Blood pressure 114/73, pulse 93, temperature 97.9 F (36.6 C), temperature source Oral, height 5\' 10"  (1.778 m), weight 211 lb (95.7 kg), SpO2 98 %. Body mass index is 30.28 kg/m. Physical Exam  Constitutional: She is oriented to person, place, and time. She appears well-developed and well-nourished.  HENT:  Head: Normocephalic and atraumatic.  Nose:  Nose normal.  Eyes: EOM are normal. No scleral icterus.  Neck: Normal range of motion. Neck supple. No thyromegaly present.  Cardiovascular: Normal rate and regular rhythm.  Pulmonary/Chest: Effort normal. No respiratory distress.  Abdominal: Soft. There is no tenderness.  + Obesity  Musculoskeletal:  Range of Motion normal in all 4 extremities Trace edema noted in bilateral lower extremities  Neurological: She is alert and oriented to person, place, and time. Coordination normal.  Skin: Skin is warm and dry.  Psychiatric: She has a normal mood and affect. Her behavior is normal.  Vitals reviewed.   RECENT LABS AND TESTS: BMET    Component Value Date/Time   NA 137 03/23/2017 0841   K 4.4 03/23/2017 0841   CL 104 03/23/2017 0841   CO2 26 03/23/2017 0841   GLUCOSE 89 03/23/2017 0841   BUN 9 03/23/2017 0841   CREATININE 0.81 03/23/2017 0841   CALCIUM 9.1 03/23/2017 0841   GFRNONAA >60 07/30/2015 0521   GFRAA >60 07/30/2015 0521   Lab Results  Component Value Date   HGBA1C 5.5 03/23/2017   No results found for: INSULIN CBC    Component Value Date/Time   WBC 5.2 03/23/2017 0841   RBC 4.68 03/23/2017 0841   HGB 13.0 03/23/2017 0841   HCT 38.4 03/23/2017 0841   PLT 259 03/23/2017 0841   MCV 82.1 03/23/2017 0841   MCH 27.8 03/23/2017 0841   MCHC 33.9 03/23/2017 0841   RDW 12.8 03/23/2017 0841   LYMPHSABS 1.8 07/30/2015 0521   MONOABS 1.5 (H) 07/30/2015 0521   EOSABS 0.1 07/30/2015 0521   BASOSABS 0.0 07/30/2015 0521   Iron/TIBC/Ferritin/ %Sat    Component Value Date/Time   IRON 97 02/13/2015 0001   TIBC 339 02/13/2015 0001   IRONPCTSAT 29 02/13/2015 0001   Lipid Panel     Component Value Date/Time  CHOL 216 (H) 03/23/2017 0841   TRIG 82 03/23/2017 0841   HDL 79 03/23/2017 0841   CHOLHDL 2.7 03/23/2017 0841   VLDL 24 02/17/2016 0728   LDLCALC 119 (H) 03/23/2017 0841   Hepatic Function Panel     Component Value Date/Time   PROT 6.9 03/23/2017 0841    ALBUMIN 4.3 02/17/2016 0728   AST 20 03/23/2017 0841   ALT 21 03/23/2017 0841   ALKPHOS 81 02/17/2016 0728   BILITOT 0.4 03/23/2017 0841   BILIDIR 0.1 08/21/2014 0853   IBILI 0.2 08/21/2014 0853      Component Value Date/Time   TSH 2.63 02/17/2016 0728   Vitamin D No recent labs  ECG  shows NSR with a rate of 81 BPM INDIRECT CALORIMETER done today shows a VO2 of 304 and a REE of 2115. Her calculated basal metabolic rate is 2979 thus her basal metabolic rate is better than expected.    ASSESSMENT AND PLAN: Other fatigue - Plan: EKG 12-Lead, Vitamin B12, CBC With Differential, Comprehensive metabolic panel, Folate, Hemoglobin A1c, Insulin, random, Lipid Panel With LDL/HDL Ratio, T3, T4, free, TSH  Shortness of breath on exertion - Plan: CBC With Differential  Vitamin D deficiency - Plan: VITAMIN D 25 Hydroxy (Vit-D Deficiency, Fractures)  Depression screening  Class 1 obesity with serious comorbidity and body mass index (BMI) of 30.0 to 30.9 in adult, unspecified obesity type  PLAN:  Fatigue Deshauna was informed that her fatigue may be related to obesity, depression or many other causes. Labs will be ordered, and in the meanwhile Louellen has agreed to work on diet, exercise and weight loss to help with fatigue. Proper sleep hygiene was discussed including the need for 7-8 hours of quality sleep each night. A sleep study was not ordered based on symptoms and Epworth score.  Dyspnea on exertion Kayla's shortness of breath appears to be obesity related and exercise induced. She has agreed to work on weight loss and gradually increase exercise to treat her exercise induced shortness of breath. If Matricia follows our instructions and loses weight without improvement of her shortness of breath, we will plan to refer to pulmonology. We will monitor this condition regularly. Ashtyn agrees to this plan.  Vitamin D Deficiency Asjia was informed that low vitamin D levels contributes to fatigue  and are associated with obesity, breast, and colon cancer. She will follow up for routine testing of vitamin D, at least 2-3 times per year. She was informed of the risk of over-replacement of vitamin D and agrees to not increase her dose unless she discusses this with [name] first. We will check Vit D level today and Mickala agrees to follow up with our clinic in 2 weeks.  At risk for osteopenia and osteoporosis Shanique is at risk for osteopenia and osteoporsis due to her vitamin D deficiency. She was encouraged to take her vitamin D and follow her higher calcium diet and increase strengthening exercise to help strengthen her bones and decrease her risk of osteopenia and osteoporosis.  Depression Screen Kiaria had a mildly positive depression screening. Depression is commonly associated with obesity and often results in emotional eating behaviors. We will monitor this closely and work on CBT to help improve the non-hunger eating patterns. Referral to Psychology may be required if no improvement is seen as she continues in our clinic.  Obesity [place] is currently in the action stage of change and her goal is to continue with weight loss efforts She has agreed to follow  the Category 3 plan Ruwayda has been instructed to work up to a goal of 150 minutes of combined cardio and strengthening exercise per week for weight loss and overall health benefits. We discussed the following Behavioral Modification Strategies today: increasing lean protein intake, increasing vegetables, work on meal planning and easy cooking plans, and planning for success  Princes has agreed to follow up with our clinic in 2 weeks. She was informed of the importance of frequent follow up visits to maximize her success with intensive lifestyle modifications for her multiple health conditions. She was informed we would discuss her lab results at her next visit unless there is a critical issue that needs to be addressed sooner. Jeanie agreed to keep  her next visit at the agreed upon time to discuss these results.    OBESITY BEHAVIORAL INTERVENTION VISIT  Today's visit was # 1 out of 22.  Starting weight: 211 lbs Starting date: 11/03/17 Today's weight : 211 lbs Today's date: 11/03/2017 Total lbs lost to date: 0 (Patients must lose 7 lbs in the first 6 months to continue with counseling)   ASK: We discussed the diagnosis of obesity with Blaire H Magee today and Marquesha agreed to give [name] permission to discuss obesity behavioral modification therapy today.  ASSESS: Analysa has the diagnosis of obesity and her BMI today is 30.28 Beonka is in the action stage of change   ADVISE: Lynia was educated on the multiple health risks of obesity as well as the benefit of weight loss to improve her health. She was advised of the need for long term treatment and the importance of lifestyle modifications.  AGREE: Multiple dietary modification options and treatment options were discussed and  Seila agreed to the above obesity treatment plan.   I, Trixie Dredge, am acting as transcriptionist for Ilene Qua, MD   I have reviewed the above documentation for accuracy and completeness, and I agree with the above. - Ilene Qua, MD

## 2017-11-04 LAB — CBC WITH DIFFERENTIAL
BASOS: 1 %
Basophils Absolute: 0 10*3/uL (ref 0.0–0.2)
EOS (ABSOLUTE): 0 10*3/uL (ref 0.0–0.4)
EOS: 0 %
HEMATOCRIT: 35.9 % (ref 34.0–46.6)
HEMOGLOBIN: 11 g/dL — AB (ref 11.1–15.9)
IMMATURE GRANS (ABS): 0 10*3/uL (ref 0.0–0.1)
IMMATURE GRANULOCYTES: 0 %
LYMPHS: 33 %
Lymphocytes Absolute: 1.6 10*3/uL (ref 0.7–3.1)
MCH: 24.8 pg — ABNORMAL LOW (ref 26.6–33.0)
MCHC: 30.6 g/dL — ABNORMAL LOW (ref 31.5–35.7)
MCV: 81 fL (ref 79–97)
MONOCYTES: 12 %
Monocytes Absolute: 0.6 10*3/uL (ref 0.1–0.9)
Neutrophils Absolute: 2.7 10*3/uL (ref 1.4–7.0)
Neutrophils: 54 %
RBC: 4.44 x10E6/uL (ref 3.77–5.28)
RDW: 17.1 % — ABNORMAL HIGH (ref 12.3–15.4)
WBC: 5 10*3/uL (ref 3.4–10.8)

## 2017-11-04 LAB — HEMOGLOBIN A1C
ESTIMATED AVERAGE GLUCOSE: 111 mg/dL
Hgb A1c MFr Bld: 5.5 % (ref 4.8–5.6)

## 2017-11-04 LAB — COMPREHENSIVE METABOLIC PANEL
A/G RATIO: 1.7 (ref 1.2–2.2)
ALT: 23 IU/L (ref 0–32)
AST: 21 IU/L (ref 0–40)
Albumin: 4.4 g/dL (ref 3.5–5.5)
Alkaline Phosphatase: 88 IU/L (ref 39–117)
BUN/Creatinine Ratio: 9 (ref 9–23)
BUN: 8 mg/dL (ref 6–24)
Bilirubin Total: 0.3 mg/dL (ref 0.0–1.2)
CALCIUM: 9.2 mg/dL (ref 8.7–10.2)
CO2: 23 mmol/L (ref 20–29)
CREATININE: 0.85 mg/dL (ref 0.57–1.00)
Chloride: 102 mmol/L (ref 96–106)
GFR calc non Af Amer: 83 mL/min/{1.73_m2} (ref >59)
GFR, EST AFRICAN AMERICAN: 96 mL/min/{1.73_m2} (ref >59)
GLOBULIN, TOTAL: 2.6 g/dL (ref 1.5–4.5)
Glucose: 87 mg/dL (ref 65–99)
POTASSIUM: 4.4 mmol/L (ref 3.5–5.2)
SODIUM: 139 mmol/L (ref 134–144)
TOTAL PROTEIN: 7 g/dL (ref 6.0–8.5)

## 2017-11-04 LAB — LIPID PANEL WITH LDL/HDL RATIO
CHOLESTEROL TOTAL: 235 mg/dL — AB (ref 100–199)
HDL: 85 mg/dL (ref >39)
LDL CALC: 134 mg/dL — AB (ref 0–99)
LDL/HDL RATIO: 1.6 ratio (ref 0.0–3.2)
TRIGLYCERIDES: 82 mg/dL (ref 0–149)
VLDL Cholesterol Cal: 16 mg/dL (ref 5–40)

## 2017-11-04 LAB — T4, FREE: Free T4: 0.98 ng/dL (ref 0.82–1.77)

## 2017-11-04 LAB — INSULIN, RANDOM: INSULIN: 10.7 u[IU]/mL (ref 2.6–24.9)

## 2017-11-04 LAB — TSH: TSH: 1.48 u[IU]/mL (ref 0.450–4.500)

## 2017-11-04 LAB — VITAMIN B12: Vitamin B-12: 803 pg/mL (ref 232–1245)

## 2017-11-04 LAB — FOLATE: Folate: >20 ng/mL (ref >3.0)

## 2017-11-04 LAB — T3: T3, Total: 131 ng/dL (ref 71–180)

## 2017-11-04 LAB — VITAMIN D 25 HYDROXY (VIT D DEFICIENCY, FRACTURES): VIT D 25 HYDROXY: 30.4 ng/mL (ref 30.0–100.0)

## 2017-11-17 ENCOUNTER — Encounter (INDEPENDENT_AMBULATORY_CARE_PROVIDER_SITE_OTHER): Payer: Self-pay

## 2017-11-17 ENCOUNTER — Ambulatory Visit (INDEPENDENT_AMBULATORY_CARE_PROVIDER_SITE_OTHER): Payer: 59 | Admitting: Family Medicine

## 2017-11-17 VITALS — BP 105/70 | HR 82 | Temp 97.6°F | Ht 70.0 in | Wt 210.0 lb

## 2017-11-17 DIAGNOSIS — E559 Vitamin D deficiency, unspecified: Secondary | ICD-10-CM

## 2017-11-17 DIAGNOSIS — E8881 Metabolic syndrome: Secondary | ICD-10-CM

## 2017-11-17 DIAGNOSIS — Z9189 Other specified personal risk factors, not elsewhere classified: Secondary | ICD-10-CM

## 2017-11-17 DIAGNOSIS — Z6830 Body mass index (BMI) 30.0-30.9, adult: Secondary | ICD-10-CM | POA: Diagnosis not present

## 2017-11-17 DIAGNOSIS — E669 Obesity, unspecified: Secondary | ICD-10-CM

## 2017-11-17 MED ORDER — VITAMIN D (ERGOCALCIFEROL) 1.25 MG (50000 UNIT) PO CAPS: 50000.0000 [IU] | ORAL_CAPSULE | ORAL | 0 refills | Status: DC

## 2017-11-18 DIAGNOSIS — J3089 Other allergic rhinitis: Secondary | ICD-10-CM | POA: Diagnosis not present

## 2017-11-18 DIAGNOSIS — J301 Allergic rhinitis due to pollen: Secondary | ICD-10-CM | POA: Diagnosis not present

## 2017-11-18 DIAGNOSIS — J3081 Allergic rhinitis due to animal (cat) (dog) hair and dander: Secondary | ICD-10-CM | POA: Diagnosis not present

## 2017-11-18 MED FILL — VIT D2 1.25 MG (50,000 UNIT: 1.25 MG | 28 days supply | Qty: 4 | Fill #0

## 2017-11-18 NOTE — Progress Notes (Signed)
Office: [phone]  /  Fax: [phone]   HPI:   Chief Complaint: OBESITY Michelle Griffin is here to discuss her progress with her obesity treatment plan. She is on the Category 3 plan and is following her eating plan approximately 50 % of the time. She states she is exercising 0 minutes 0 times per week. Michelle Griffin struggled to follow her plan and she deviated significantly, especially on vacation. Michelle Griffin is on Contrave 2 tablets every morning, but she is not eating breakfast. Her weight is 210 lb (95.3 kg) today and has had a weight loss of 1 pound over a period of 2 weeks since her last visit. She has lost 1 lb since starting treatment with [name].  Vitamin D deficiency (new) Michelle Griffin has a new diagnosis of vitamin D deficiency. She is not currently taking vit D. Michelle Griffin admits fatigue and denies nausea, vomiting or muscle weakness.  Insulin Resistance Michelle Griffin has a diagnosis of insulin resistance. She has mildly elevated fasting insulin with normal A1c and glucose. Although Michelle Griffin's blood glucose readings are still under good control, insulin resistance puts her at greater risk of metabolic syndrome and diabetes. She is not taking metformin currently. Michelle Griffin admits polyphagia, worse in the evening. Michelle Griffin continues to work on diet and exercise to decrease risk of diabetes.  At risk for diabetes Michelle Griffin is at higher than average risk for developing diabetes due to her obesity and insulin resistance. She currently denies polyuria or polydipsia.  ALLERGIES: Allergies  Allergen Reactions  . Sulfa Antibiotics Hives    MEDICATIONS: Current Outpatient Medications on File Prior to Visit  Medication Sig Dispense Refill  . albuterol (PROVENTIL HFA;VENTOLIN HFA) 108 (90 BASE) MCG/ACT inhaler Inhale 2 puffs into the lungs every 6 (six) hours as needed for wheezing or shortness of breath. 1 Inhaler 0  . azelastine (ASTELIN) 0.1 % nasal spray Place 2 sprays into both nostrils 2 (two) times daily. Use in each nostril as  directed 30 mL 0  . CONTRAVE 8-90 MG TB12 TAKE 2 TABLETS BY MOUTH 2 (TWO) TIMES DAILY. 120 tablet 2  . EPINEPHrine (EPI-PEN) 0.3 mg/0.3 mL SOAJ injection Inject 0.3 mg into the muscle once.    [name] esomeprazole (NEXIUM) 40 MG capsule Take 40 mg by mouth daily at 12 noon.    . famotidine (PEPCID) 40 MG tablet Take 1 tablet (40 mg total) by mouth daily. 90 tablet 3  . fluticasone (FLONASE) 50 MCG/ACT nasal spray Place into both nostrils daily.    [name] ibuprofen (ADVIL,MOTRIN) 200 MG tablet Take 600 mg by mouth every 4 (four) hours as needed.    . Lactobacillus (PROBIOTIC ACIDOPHILUS) CAPS Take 1 capsule by mouth daily.    [name] levocetirizine (XYZAL) 5 MG tablet Take 5 mg by mouth every evening.    . Melatonin 5 MG CAPS Take by mouth.    . methocarbamol (ROBAXIN) 500 MG tablet Take 1 tablet (500 mg total) by mouth 3 (three) times daily. (Patient taking differently: Take 500 mg by mouth every 6 (six) hours as needed. ) 20 tablet 0  . montelukast (SINGULAIR) 10 MG tablet Take 10 mg by mouth at bedtime.    . Multiple Vitamins-Minerals (MULTIVITAMIN WITH MINERALS) tablet Take 1 tablet by mouth daily.    . nitroGLYCERIN (NITRODUR - DOSED IN MG/24 HR) 0.2 mg/hr patch PLACE 1 PATCH (0.2 MG TOTAL) ONTO THE SKIN DAILY. 30 patch 11  . predniSONE (DELTASONE) 10 MG tablet Take 10 mg by mouth daily as needed.     [name]  sertraline (ZOLOFT) 100 MG tablet TAKE 1 TABLET BY MOUTH ONCE DAILY 90 tablet 1  . tolterodine (DETROL) 2 MG tablet Take 2 mg by mouth at bedtime.      No current facility-administered medications on file prior to visit.     PAST MEDICAL HISTORY: Past Medical History:  Diagnosis Date  . Allergy    sees [name]  . Anemia    hx/o iron deficiency due to heavy periods  . Asthma   . Bursitis   . DDD (degenerative disc disease), lumbar   . Fatty liver   . GERD (gastroesophageal reflux disease)    [name]  . H/O mammogram   . Hiatal hernia    small per EGD, [name]  . Hyperlipidemia 2012    was on pravastatin prior  . Migraine    prior eval with neurologist, Dr. Melton Alar  . Overactive bladder   . PMDD (premenstrual dysphoric disorder)   . Pyelonephritis    age 89yo  . Routine gynecological examination    [place], [name], NP  . Uterine infection 07/2015   s/p novasure ablation  . Vitamin D deficiency   . Wears glasses     PAST SURGICAL HISTORY: Past Surgical History:  Procedure Laterality Date  . CHOLECYSTECTOMY    . COLONOSCOPY  2014   repeat 5 years; [name], baseline  . ESOPHAGOGASTRODUODENOSCOPY  2014   [name]  . FOOT SURGERY Left    morton's neuroma  . LASIK     [name]  . LUMBAR DISC SURGERY  07/2013   due to herniated disc, [name]  . NOVASURE ABLATION    . WISDOM TOOTH EXTRACTION      SOCIAL HISTORY: Social History   Tobacco Use  . Smoking status: Never Smoker  . Smokeless tobacco: Never Used  Substance Use Topics  . Alcohol use: Yes    Alcohol/week: 1.8 oz    Types: 3 Cans of beer per week    Comment: socially  . Drug use: No    FAMILY HISTORY: Family History  Problem Relation Age of Onset  . Lupus Mother   . Hypertension Mother   . Hyperlipidemia Mother   . Migraines Mother   . Kidney disease Mother   . Hypertension Father   . Diabetes Father   . Hyperlipidemia Father   . Depression Father   . Anxiety disorder Father   . Obesity Sister   . Hypertension Sister   . Diabetes Sister   . Heart disease Paternal Uncle        CABG  . Hyperlipidemia Paternal Uncle   . Cancer Paternal Uncle        multiple with colon cancer  . Cancer Cousin        breast  . Cancer Cousin        ovarian  . Ovarian cysts Daughter   . Stroke Neg Hx     ROS: Review of Systems  Constitutional: Positive for malaise/fatigue and weight loss.  Gastrointestinal: Negative for nausea and vomiting.  Genitourinary: Negative for frequency.  Musculoskeletal:       Negative for muscle weakness  Endo/Heme/Allergies: Negative  for polydipsia.       Positive for polyphagia    PHYSICAL EXAM: Blood pressure 105/70, pulse 82, temperature 97.6 F (36.4 C), temperature source Oral, height 5\' 10"  (1.778 m), weight 210 lb (95.3 kg), SpO2 100 %. Body mass index is 30.13 kg/m. Physical Exam  Constitutional: She is oriented to  person, place, and time. She appears well-developed and well-nourished.  Cardiovascular: Normal rate.  Pulmonary/Chest: Effort normal.  Musculoskeletal: Normal range of motion.  Neurological: She is oriented to person, place, and time.  Skin: Skin is warm and dry.  Psychiatric: She has a normal mood and affect. Her behavior is normal.  Vitals reviewed.   RECENT LABS AND TESTS: BMET    Component Value Date/Time   NA 139 11/03/2017 1153   K 4.4 11/03/2017 1153   CL 102 11/03/2017 1153   CO2 23 11/03/2017 1153   GLUCOSE 87 11/03/2017 1153   GLUCOSE 89 03/23/2017 0841   BUN 8 11/03/2017 1153   CREATININE 0.85 11/03/2017 1153   CREATININE 0.81 03/23/2017 0841   CALCIUM 9.2 11/03/2017 1153   GFRNONAA 83 11/03/2017 1153   GFRAA 96 11/03/2017 1153   Lab Results  Component Value Date   HGBA1C 5.5 11/03/2017   HGBA1C 5.5 03/23/2017   HGBA1C 5.4 02/17/2016   HGBA1C 5.6 02/13/2015   HGBA1C 5.7 (H) 08/21/2014   Lab Results  Component Value Date   INSULIN 10.7 11/03/2017   CBC    Component Value Date/Time   WBC 5.0 11/03/2017 1153   WBC 5.2 03/23/2017 0841   RBC 4.44 11/03/2017 1153   RBC 4.68 03/23/2017 0841   HGB 11.0 (L) 11/03/2017 1153   HCT 35.9 11/03/2017 1153   PLT 259 03/23/2017 0841   MCV 81 11/03/2017 1153   MCH 24.8 (L) 11/03/2017 1153   MCH 27.8 03/23/2017 0841   MCHC 30.6 (L) 11/03/2017 1153   MCHC 33.9 03/23/2017 0841   RDW 17.1 (H) 11/03/2017 1153   LYMPHSABS 1.6 11/03/2017 1153   MONOABS 1.5 (H) 07/30/2015 0521   EOSABS 0.0 11/03/2017 1153   BASOSABS 0.0 11/03/2017 1153   Iron/TIBC/Ferritin/ %Sat    Component Value Date/Time   IRON 97 02/13/2015 0001    TIBC 339 02/13/2015 0001   IRONPCTSAT 29 02/13/2015 0001   Lipid Panel     Component Value Date/Time   CHOL 235 (H) 11/03/2017 1153   TRIG 82 11/03/2017 1153   HDL 85 11/03/2017 1153   CHOLHDL 2.7 03/23/2017 0841   VLDL 24 02/17/2016 0728   LDLCALC 134 (H) 11/03/2017 1153   LDLCALC 119 (H) 03/23/2017 0841   Hepatic Function Panel     Component Value Date/Time   PROT 7.0 11/03/2017 1153   ALBUMIN 4.4 11/03/2017 1153   AST 21 11/03/2017 1153   ALT 23 11/03/2017 1153   ALKPHOS 88 11/03/2017 1153   BILITOT 0.3 11/03/2017 1153   BILIDIR 0.1 08/21/2014 0853   IBILI 0.2 08/21/2014 0853      Component Value Date/Time   TSH 1.480 11/03/2017 1153   TSH 2.63 02/17/2016 0728   TSH 2.697 08/09/2013 1031   Results for KENSINGTON, RIOS (MRN [id number]) as of 11/18/2017 08:36  Ref. Range 11/03/2017 11:53  Vitamin D, 25-Hydroxy Latest Ref Range: 30.0 - 100.0 ng/mL 30.4   ASSESSMENT AND PLAN: Vitamin D deficiency - Plan: Vitamin D, Ergocalciferol, (DRISDOL) 50000 units CAPS capsule  Insulin resistance  At risk for diabetes mellitus  Class 1 obesity with serious comorbidity and body mass index (BMI) of 30.0 to 30.9 in adult, unspecified obesity type  PLAN:  Vitamin D Deficiency (new) Michelle Griffin was informed that low vitamin D levels contributes to fatigue and are associated with obesity, breast, and colon cancer. She agrees to start prescription Vit D @50 ,000 IU every week #4 with no refills. We will recheck labs in  3 months and she will follow up for routine testing of vitamin D, at least 2-3 times per year. She was informed of the risk of over-replacement of vitamin D and agrees to not increase her dose unless she discusses this with [name] first. Michelle Griffin agrees to follow up as directed.  Insulin Resistance Michelle Griffin will continue to work on weight loss, exercise, and decreasing simple carbohydrates in her diet to help decrease the risk of diabetes. She was informed that eating too many simple  carbohydrates or too many calories at one sitting increases the likelihood of GI side effects. We will recheck labs in 3 months and Michelle Griffin agreed to follow up with [name] as directed to monitor her progress.  Diabetes risk counseling Charlesa was given extended (30 minutes) diabetes prevention counseling today. She is 47 y.o. female and has risk factors for diabetes including obesity and insulin resistance. We discussed intensive lifestyle modifications today with an emphasis on weight loss as well as increasing exercise and decreasing simple carbohydrates in her diet.  Obesity Michelle Griffin is currently in the action stage of change. As such, her goal is to continue with weight loss efforts She has agreed to follow the Category 3 plan Michelle Griffin has been instructed to work up to a goal of 150 minutes of combined cardio and strengthening exercise per week for weight loss and overall health benefits. We discussed the following Behavioral Modification Strategies today: increasing lean protein intake, decreasing simple carbohydrates  and work on meal planning and easy cooking plans  We discussed various medication options to help Michelle Griffin with her weight loss efforts and we both agreed to change Contrave to 1 tablet 2 times daily (no refill needed).  Michelle Griffin has agreed to follow up with our clinic in 2 to 3 weeks. She was informed of the importance of frequent follow up visits to maximize her success with intensive lifestyle modifications for her multiple health conditions.   OBESITY BEHAVIORAL INTERVENTION VISIT  Today's visit was # 2 out of 22.  Starting weight: 211 lbs Starting date: 11/03/17 Today's weight : 210 lbs  Today's date: 11/17/2017 Total lbs lost to date: 1 (Patients must lose 7 lbs in the first 6 months to continue with counseling)   ASK: We discussed the diagnosis of obesity with Michelle Griffin today and Michelle Griffin agreed to give [name] permission to discuss obesity behavioral modification therapy  today.  ASSESS: Michelle Griffin has the diagnosis of obesity and her BMI today is 30.13 Michelle Griffin is in the action stage of change   ADVISE: Michelle Griffin was educated on the multiple health risks of obesity as well as the benefit of weight loss to improve her health. She was advised of the need for long term treatment and the importance of lifestyle modifications.  AGREE: Multiple dietary modification options and treatment options were discussed and  Michelle Griffin agreed to the above obesity treatment plan.  I, Doreene Nest, am acting as transcriptionist for Dennard Nip, MD  I have reviewed the above documentation for accuracy and completeness, and I agree with the above. -Dennard Nip, MD

## 2017-11-22 MED FILL — SERTRALINE HCL 100 MG TAB: 100 | 90 days supply | Qty: 90 | Fill #0

## 2017-11-29 DIAGNOSIS — J3089 Other allergic rhinitis: Secondary | ICD-10-CM | POA: Diagnosis not present

## 2017-11-29 DIAGNOSIS — J301 Allergic rhinitis due to pollen: Secondary | ICD-10-CM | POA: Diagnosis not present

## 2017-11-29 DIAGNOSIS — J3081 Allergic rhinitis due to animal (cat) (dog) hair and dander: Secondary | ICD-10-CM | POA: Diagnosis not present

## 2017-12-01 ENCOUNTER — Encounter (INDEPENDENT_AMBULATORY_CARE_PROVIDER_SITE_OTHER): Payer: Self-pay

## 2017-12-01 ENCOUNTER — Ambulatory Visit (INDEPENDENT_AMBULATORY_CARE_PROVIDER_SITE_OTHER): Payer: 59 | Admitting: Physician Assistant

## 2017-12-01 ENCOUNTER — Ambulatory Visit (INDEPENDENT_AMBULATORY_CARE_PROVIDER_SITE_OTHER): Payer: 59 | Admitting: Family Medicine

## 2017-12-01 VITALS — BP 89/63 | HR 83 | Temp 98.3°F | Ht 70.0 in | Wt 210.0 lb

## 2017-12-01 DIAGNOSIS — E782 Mixed hyperlipidemia: Secondary | ICD-10-CM | POA: Diagnosis not present

## 2017-12-01 DIAGNOSIS — J301 Allergic rhinitis due to pollen: Secondary | ICD-10-CM | POA: Diagnosis not present

## 2017-12-01 DIAGNOSIS — J3089 Other allergic rhinitis: Secondary | ICD-10-CM | POA: Diagnosis not present

## 2017-12-01 DIAGNOSIS — Z9189 Other specified personal risk factors, not elsewhere classified: Secondary | ICD-10-CM | POA: Diagnosis not present

## 2017-12-01 DIAGNOSIS — Z6830 Body mass index (BMI) 30.0-30.9, adult: Secondary | ICD-10-CM | POA: Diagnosis not present

## 2017-12-01 DIAGNOSIS — E559 Vitamin D deficiency, unspecified: Secondary | ICD-10-CM

## 2017-12-01 DIAGNOSIS — E669 Obesity, unspecified: Secondary | ICD-10-CM

## 2017-12-01 DIAGNOSIS — J3081 Allergic rhinitis due to animal (cat) (dog) hair and dander: Secondary | ICD-10-CM | POA: Diagnosis not present

## 2017-12-01 MED ORDER — VITAMIN D (ERGOCALCIFEROL) 1.25 MG (50000 UNIT) PO CAPS: 50000.0000 [IU] | ORAL_CAPSULE | ORAL | 0 refills | Status: DC

## 2017-12-01 NOTE — Progress Notes (Signed)
Office: [phone]  /  Fax: [phone]   HPI:   Chief Complaint: OBESITY Michelle Griffin is here to discuss her progress with her obesity treatment plan. She is on the Category 3 plan and is following her eating plan approximately 80 % of the time. She states she [place] the beach for 90 minutes 1 times per week. Michelle Griffin maintained her weight. She was on vacation and made mindful food choices and controlled her portions.  Her weight is 210 lb (95.3 kg) today and has not lost weight since her last visit. She has lost 1 lb since starting treatment with [name].  Vitamin D Deficiency Michelle Griffin has a diagnosis of vitamin D deficiency. She is currently taking prescription Vit D and denies nausea, vomiting or muscle weakness.  Hyperlipidemia Michelle Griffin has hyperlipidemia and has been trying to improve her cholesterol levels with intensive lifestyle modification including a low saturated fat diet, exercise and weight loss. She is not on medications, declines. She denies any chest pain, claudication or myalgias.  At risk for cardiovascular disease Michelle Griffin is at a higher than average risk for cardiovascular disease due to obesity and hyperlipidemia. She currently denies any chest pain.  ALLERGIES: Allergies  Allergen Reactions  . Sulfa Antibiotics Hives    MEDICATIONS: Current Outpatient Medications on File Prior to Visit  Medication Sig Dispense Refill  . albuterol (PROVENTIL HFA;VENTOLIN HFA) 108 (90 BASE) MCG/ACT inhaler Inhale 2 puffs into the lungs every 6 (six) hours as needed for wheezing or shortness of breath. 1 Inhaler 0  . azelastine (ASTELIN) 0.1 % nasal spray Place 2 sprays into both nostrils 2 (two) times daily. Use in each nostril as directed 30 mL 0  . CONTRAVE 8-90 MG TB12 TAKE 2 TABLETS BY MOUTH 2 (TWO) TIMES DAILY. 120 tablet 2  . EPINEPHrine (EPI-PEN) 0.3 mg/0.3 mL SOAJ injection Inject 0.3 mg into the muscle once.    [name] esomeprazole (NEXIUM) 40 MG capsule Take 40 mg by mouth daily at 12  noon.    . famotidine (PEPCID) 40 MG tablet Take 1 tablet (40 mg total) by mouth daily. 90 tablet 3  . fluticasone (FLONASE) 50 MCG/ACT nasal spray Place into both nostrils daily.    [name] ibuprofen (ADVIL,MOTRIN) 200 MG tablet Take 600 mg by mouth every 4 (four) hours as needed.    . Lactobacillus (PROBIOTIC ACIDOPHILUS) CAPS Take 1 capsule by mouth daily.    [name] levocetirizine (XYZAL) 5 MG tablet Take 5 mg by mouth every evening.    . Melatonin 5 MG CAPS Take by mouth.    . methocarbamol (ROBAXIN) 500 MG tablet Take 1 tablet (500 mg total) by mouth 3 (three) times daily. (Patient taking differently: Take 500 mg by mouth every 6 (six) hours as needed. ) 20 tablet 0  . montelukast (SINGULAIR) 10 MG tablet Take 10 mg by mouth at bedtime.    . Multiple Vitamins-Minerals (MULTIVITAMIN WITH MINERALS) tablet Take 1 tablet by mouth daily.    . nitroGLYCERIN (NITRODUR - DOSED IN MG/24 HR) 0.2 mg/hr patch PLACE 1 PATCH (0.2 MG TOTAL) ONTO THE SKIN DAILY. 30 patch 11  . predniSONE (DELTASONE) 10 MG tablet Take 10 mg by mouth daily as needed.     . sertraline (ZOLOFT) 100 MG tablet TAKE 1 TABLET BY MOUTH ONCE DAILY 90 tablet 1  . tolterodine (DETROL) 2 MG tablet Take 2 mg by mouth at bedtime.      No current facility-administered medications on file prior to visit.  PAST MEDICAL HISTORY: Past Medical History:  Diagnosis Date  . Allergy    sees [name]  . Anemia    hx/o iron deficiency due to heavy periods  . Asthma   . Bursitis   . DDD (degenerative disc disease), lumbar   . Fatty liver   . GERD (gastroesophageal reflux disease)    [name]  . H/O mammogram   . Hiatal hernia    small per EGD, [name]  . Hyperlipidemia 2012   was on pravastatin prior  . Migraine    prior eval with neurologist, Dr. Melton Alar  . Overactive bladder   . PMDD (premenstrual dysphoric disorder)   . Pyelonephritis    age 3yo  . Routine gynecological examination    [place], [name], NP  .  Uterine infection 07/2015   s/p novasure ablation  . Vitamin D deficiency   . Wears glasses     PAST SURGICAL HISTORY: Past Surgical History:  Procedure Laterality Date  . CHOLECYSTECTOMY    . COLONOSCOPY  2014   repeat 5 years; [name], baseline  . ESOPHAGOGASTRODUODENOSCOPY  2014   [name]  . FOOT SURGERY Left    morton's neuroma  . LASIK     [name]  . LUMBAR DISC SURGERY  07/2013   due to herniated disc, [name]  . NOVASURE ABLATION    . WISDOM TOOTH EXTRACTION      SOCIAL HISTORY: Social History   Tobacco Use  . Smoking status: Never Smoker  . Smokeless tobacco: Never Used  Substance Use Topics  . Alcohol use: Yes    Alcohol/week: 1.8 oz    Types: 3 Cans of beer per week    Comment: socially  . Drug use: No    FAMILY HISTORY: Family History  Problem Relation Age of Onset  . Lupus Mother   . Hypertension Mother   . Hyperlipidemia Mother   . Migraines Mother   . Kidney disease Mother   . Hypertension Father   . Diabetes Father   . Hyperlipidemia Father   . Depression Father   . Anxiety disorder Father   . Obesity Sister   . Hypertension Sister   . Diabetes Sister   . Heart disease Paternal Uncle        CABG  . Hyperlipidemia Paternal Uncle   . Cancer Paternal Uncle        multiple with colon cancer  . Cancer Cousin        breast  . Cancer Cousin        ovarian  . Ovarian cysts Daughter   . Stroke Neg Hx     ROS: Review of Systems  Constitutional: Negative for weight loss.  Cardiovascular: Negative for chest pain and claudication.  Gastrointestinal: Negative for nausea and vomiting.  Musculoskeletal: Negative for myalgias.       Negative muscle weakness    PHYSICAL EXAM: Blood pressure (!) 89/63, pulse 83, temperature 98.3 F (36.8 C), temperature source Oral, height 5\' 10"  (1.778 m), weight 210 lb (95.3 kg), SpO2 99 %. Body mass index is 30.13 kg/m. Physical Exam  Constitutional: She is oriented to person, place, and  time. She appears well-developed and well-nourished.  Cardiovascular: Normal rate.  Pulmonary/Chest: Effort normal.  Musculoskeletal: Normal range of motion.  Neurological: She is oriented to person, place, and time.  Skin: Skin is warm and dry.  Psychiatric: She has a normal mood and affect. Her behavior is normal.  Vitals reviewed.  RECENT LABS AND TESTS: BMET    Component Value Date/Time   NA 139 11/03/2017 1153   K 4.4 11/03/2017 1153   CL 102 11/03/2017 1153   CO2 23 11/03/2017 1153   GLUCOSE 87 11/03/2017 1153   GLUCOSE 89 03/23/2017 0841   BUN 8 11/03/2017 1153   CREATININE 0.85 11/03/2017 1153   CREATININE 0.81 03/23/2017 0841   CALCIUM 9.2 11/03/2017 1153   GFRNONAA 83 11/03/2017 1153   GFRAA 96 11/03/2017 1153   Lab Results  Component Value Date   HGBA1C 5.5 11/03/2017   HGBA1C 5.5 03/23/2017   HGBA1C 5.4 02/17/2016   HGBA1C 5.6 02/13/2015   HGBA1C 5.7 (H) 08/21/2014   Lab Results  Component Value Date   INSULIN 10.7 11/03/2017   CBC    Component Value Date/Time   WBC 5.0 11/03/2017 1153   WBC 5.2 03/23/2017 0841   RBC 4.44 11/03/2017 1153   RBC 4.68 03/23/2017 0841   HGB 11.0 (L) 11/03/2017 1153   HCT 35.9 11/03/2017 1153   PLT 259 03/23/2017 0841   MCV 81 11/03/2017 1153   MCH 24.8 (L) 11/03/2017 1153   MCH 27.8 03/23/2017 0841   MCHC 30.6 (L) 11/03/2017 1153   MCHC 33.9 03/23/2017 0841   RDW 17.1 (H) 11/03/2017 1153   LYMPHSABS 1.6 11/03/2017 1153   MONOABS 1.5 (H) 07/30/2015 0521   EOSABS 0.0 11/03/2017 1153   BASOSABS 0.0 11/03/2017 1153   Iron/TIBC/Ferritin/ %Sat    Component Value Date/Time   IRON 97 02/13/2015 0001   TIBC 339 02/13/2015 0001   IRONPCTSAT 29 02/13/2015 0001   Lipid Panel     Component Value Date/Time   CHOL 235 (H) 11/03/2017 1153   TRIG 82 11/03/2017 1153   HDL 85 11/03/2017 1153   CHOLHDL 2.7 03/23/2017 0841   VLDL 24 02/17/2016 0728   LDLCALC 134 (H) 11/03/2017 1153   LDLCALC 119 (H) 03/23/2017 0841    Hepatic Function Panel     Component Value Date/Time   PROT 7.0 11/03/2017 1153   ALBUMIN 4.4 11/03/2017 1153   AST 21 11/03/2017 1153   ALT 23 11/03/2017 1153   ALKPHOS 88 11/03/2017 1153   BILITOT 0.3 11/03/2017 1153   BILIDIR 0.1 08/21/2014 0853   IBILI 0.2 08/21/2014 0853      Component Value Date/Time   TSH 1.480 11/03/2017 1153   TSH 2.63 02/17/2016 0728   TSH 2.697 08/09/2013 1031  Results for SELAH, KLANG (MRN [id number]) as of 12/01/2017 10:51  Ref. Range 11/03/2017 11:53  Vitamin D, 25-Hydroxy Latest Ref Range: 30.0 - 100.0 ng/mL 30.4    ASSESSMENT AND PLAN: Vitamin D deficiency - Plan: Vitamin D, Ergocalciferol, (DRISDOL) 50000 units CAPS capsule  Mixed hyperlipidemia  At risk for heart disease  Class 1 obesity with serious comorbidity and body mass index (BMI) of 30.0 to 30.9 in adult, unspecified obesity type  PLAN:  Vitamin D Deficiency Michelle Griffin was informed that low vitamin D levels contributes to fatigue and are associated with obesity, breast, and colon cancer. Michelle Griffin agrees to continue taking prescription Vit D @50 ,000 IU every week #4 and we will refill for 1 month. She will follow up for routine testing of vitamin D, at least 2-3 times per year. She was informed of the risk of over-replacement of vitamin D and agrees to not increase her dose unless she discusses this with [name] first. Michelle Griffin agrees to follow up with our clinic in 2 weeks.  Hyperlipidemia Michelle Griffin was informed of the  American Heart Association Guidelines emphasizing intensive lifestyle modifications as the first line treatment for hyperlipidemia. We discussed many lifestyle modifications today in depth, and Michelle Griffin will continue to work on decreasing saturated fats such as fatty red meat, butter and many fried foods. She will also increase vegetables and lean protein in her diet and continue to work on diet, exercise, and weight loss efforts. Michelle Griffin agrees to follow up with our clinic in 2  weeks.  Cardiovascular risk counselling Michelle Griffin was given extended (15 minutes) coronary artery disease prevention counseling today. She is 47 y.o. female and has risk factors for heart disease including obesity and hyperlipidemia. We discussed intensive lifestyle modifications today with an emphasis on specific weight loss instructions and strategies. Pt was also informed of the importance of increasing exercise and decreasing saturated fats to help prevent heart disease.  Obesity Michelle Griffin is currently in the action stage of change. As such, her goal is to continue with weight loss efforts She has agreed to follow the Category 3 plan Michelle Griffin has been instructed to work up to a goal of 150 minutes of combined cardio and strengthening exercise per week for weight loss and overall health benefits. We discussed the following Behavioral Modification Strategies today: increasing lean protein intake and work on meal planning and easy cooking plans   Michelle Griffin has agreed to follow up with our clinic in 2 weeks. She was informed of the importance of frequent follow up visits to maximize her success with intensive lifestyle modifications for her multiple health conditions.   OBESITY BEHAVIORAL INTERVENTION VISIT  Today's visit was # 3 out of 22.  Starting weight: 211 lbs Starting date: 11/03/17 Today's weight : 210 lbs  Today's date: 12/01/2017 Total lbs lost to date: 1 (Patients must lose 7 lbs in the first 6 months to continue with counseling)   ASK: We discussed the diagnosis of obesity with [place] H Moll today and [place] agreed to give [name] permission to discuss obesity behavioral modification therapy today.  ASSESS: Michelle Griffin has the diagnosis of obesity and her BMI today is 30.13 Lavelle is in the action stage of change   ADVISE: Janalynn was educated on the multiple health risks of obesity as well as the benefit of weight loss to improve her health. She was advised of the need for long term treatment and the  importance of lifestyle modifications.  AGREE: Multiple dietary modification options and treatment options were discussed and  Mahalie agreed to the above obesity treatment plan.   Wilhemena Durie, am acting as transcriptionist for Lacy Duverney, PA-C I, Lacy Duverney [facility], have reviewed this note and agree with its content

## 2017-12-08 MED FILL — FLUTICASONE PROP 50 MCG SPR: 50 | 30 days supply | Qty: 16 | Fill #0

## 2017-12-08 MED FILL — CONTRAVE ER 8-90 MG TABLET: 8-90 | 30 days supply | Qty: 120 | Fill #2

## 2017-12-10 MED FILL — VIT D2 1.25 MG (50,000 UNIT: 1.25 MG | 28 days supply | Qty: 4 | Fill #0

## 2017-12-15 DIAGNOSIS — J3089 Other allergic rhinitis: Secondary | ICD-10-CM | POA: Diagnosis not present

## 2017-12-15 DIAGNOSIS — J3081 Allergic rhinitis due to animal (cat) (dog) hair and dander: Secondary | ICD-10-CM | POA: Diagnosis not present

## 2017-12-15 DIAGNOSIS — J301 Allergic rhinitis due to pollen: Secondary | ICD-10-CM | POA: Diagnosis not present

## 2017-12-23 ENCOUNTER — Encounter (INDEPENDENT_AMBULATORY_CARE_PROVIDER_SITE_OTHER): Payer: Self-pay

## 2017-12-23 ENCOUNTER — Ambulatory Visit (INDEPENDENT_AMBULATORY_CARE_PROVIDER_SITE_OTHER): Payer: 59 | Admitting: Family Medicine

## 2017-12-24 DIAGNOSIS — J3089 Other allergic rhinitis: Secondary | ICD-10-CM | POA: Diagnosis not present

## 2017-12-24 DIAGNOSIS — J301 Allergic rhinitis due to pollen: Secondary | ICD-10-CM | POA: Diagnosis not present

## 2017-12-24 DIAGNOSIS — J3081 Allergic rhinitis due to animal (cat) (dog) hair and dander: Secondary | ICD-10-CM | POA: Diagnosis not present

## 2018-01-04 ENCOUNTER — Ambulatory Visit (INDEPENDENT_AMBULATORY_CARE_PROVIDER_SITE_OTHER): Payer: 59 | Admitting: Physician Assistant

## 2018-01-04 ENCOUNTER — Other Ambulatory Visit (INDEPENDENT_AMBULATORY_CARE_PROVIDER_SITE_OTHER): Payer: Self-pay | Admitting: Physician Assistant

## 2018-01-04 VITALS — BP 99/66 | HR 72 | Temp 98.2°F | Ht 70.0 in | Wt 211.0 lb

## 2018-01-04 DIAGNOSIS — E559 Vitamin D deficiency, unspecified: Secondary | ICD-10-CM

## 2018-01-04 DIAGNOSIS — J301 Allergic rhinitis due to pollen: Secondary | ICD-10-CM | POA: Diagnosis not present

## 2018-01-04 DIAGNOSIS — E669 Obesity, unspecified: Secondary | ICD-10-CM

## 2018-01-04 DIAGNOSIS — Z6830 Body mass index (BMI) 30.0-30.9, adult: Secondary | ICD-10-CM | POA: Diagnosis not present

## 2018-01-04 DIAGNOSIS — E7849 Other hyperlipidemia: Secondary | ICD-10-CM

## 2018-01-04 DIAGNOSIS — E66811 Obesity, class 1: Secondary | ICD-10-CM

## 2018-01-04 DIAGNOSIS — J3089 Other allergic rhinitis: Secondary | ICD-10-CM | POA: Diagnosis not present

## 2018-01-04 DIAGNOSIS — Z9189 Other specified personal risk factors, not elsewhere classified: Secondary | ICD-10-CM | POA: Diagnosis not present

## 2018-01-04 DIAGNOSIS — J3081 Allergic rhinitis due to animal (cat) (dog) hair and dander: Secondary | ICD-10-CM | POA: Diagnosis not present

## 2018-01-04 MED ORDER — VITAMIN D (ERGOCALCIFEROL) 1.25 MG (50000 UNIT) PO CAPS
50000.0000 [IU] | ORAL_CAPSULE | ORAL | 0 refills | Status: DC
Start: 2018-01-04 — End: 2018-01-25

## 2018-01-04 MED FILL — FAMOTIDINE 40 MG TABLET: 40 | 90 days supply | Qty: 90 | Fill #3

## 2018-01-04 MED FILL — VIT D2 1.25 MG (50,000 UNIT: 1.25 MG | 28 days supply | Qty: 4 | Fill #0

## 2018-01-05 NOTE — Progress Notes (Signed)
Office: [phone]  /  Fax: [phone]   HPI:   Chief Complaint: OBESITY Michelle Griffin is here to discuss her progress with her obesity treatment plan. She is on the Category 3 plan and is following her eating plan approximately 80 % of the time. She states she is exercising 0 minutes 0 times per week. Michelle Griffin struggled to follow Category 3 plan. She reports that she is unable to eat the amount of protein required on Category 3 most nights. She would like more freedom with the plan and is motivated to get back on track.  Her weight is 211 lb (95.7 kg) today and has gained 1 pound since her last visit. She has lost 0 lbs since starting treatment with [name].  Vitamin D Deficiency Michelle Griffin has a diagnosis of vitamin D deficiency. She is currently taking prescription Vit D, last level at goal. She denies nausea, vomiting or muscle weakness.  At risk for osteopenia and osteoporosis Michelle Griffin is at higher risk of osteopenia and osteoporosis due to vitamin D deficiency.   Hyperlipidemia Michelle Griffin has hyperlipidemia and has been trying to improve her cholesterol levels with intensive lifestyle modification including a low saturated fat diet, exercise and weight loss. She is not currently on medications and denies any chest pain, claudication or myalgias.  ALLERGIES: Allergies  Allergen Reactions  . Sulfa Antibiotics Hives    MEDICATIONS: Current Outpatient Medications on File Prior to Visit  Medication Sig Dispense Refill  . albuterol (PROVENTIL HFA;VENTOLIN HFA) 108 (90 BASE) MCG/ACT inhaler Inhale 2 puffs into the lungs every 6 (six) hours as needed for wheezing or shortness of breath. 1 Inhaler 0  . azelastine (ASTELIN) 0.1 % nasal spray Place 2 sprays into both nostrils 2 (two) times daily. Use in each nostril as directed 30 mL 0  . CONTRAVE 8-90 MG TB12 TAKE 2 TABLETS BY MOUTH 2 (TWO) TIMES DAILY. 120 tablet 2  . EPINEPHrine (EPI-PEN) 0.3 mg/0.3 mL SOAJ injection Inject 0.3 mg into the muscle once.    [name]  esomeprazole (NEXIUM) 40 MG capsule Take 40 mg by mouth daily at 12 noon.    . famotidine (PEPCID) 40 MG tablet Take 1 tablet (40 mg total) by mouth daily. 90 tablet 3  . fluticasone (FLONASE) 50 MCG/ACT nasal spray Place into both nostrils daily.    [name] ibuprofen (ADVIL,MOTRIN) 200 MG tablet Take 600 mg by mouth every 4 (four) hours as needed.    . Lactobacillus (PROBIOTIC ACIDOPHILUS) CAPS Take 1 capsule by mouth daily.    [name] levocetirizine (XYZAL) 5 MG tablet Take 5 mg by mouth every evening.    . Melatonin 5 MG CAPS Take by mouth.    . methocarbamol (ROBAXIN) 500 MG tablet Take 1 tablet (500 mg total) by mouth 3 (three) times daily. (Patient taking differently: Take 500 mg by mouth every 6 (six) hours as needed. ) 20 tablet 0  . montelukast (SINGULAIR) 10 MG tablet Take 10 mg by mouth at bedtime.    . Multiple Vitamins-Minerals (MULTIVITAMIN WITH MINERALS) tablet Take 1 tablet by mouth daily.    . nitroGLYCERIN (NITRODUR - DOSED IN MG/24 HR) 0.2 mg/hr patch PLACE 1 PATCH (0.2 MG TOTAL) ONTO THE SKIN DAILY. 30 patch 11  . predniSONE (DELTASONE) 10 MG tablet Take 10 mg by mouth daily as needed.     . sertraline (ZOLOFT) 100 MG tablet TAKE 1 TABLET BY MOUTH ONCE DAILY 90 tablet 1  . tolterodine (DETROL) 2 MG tablet Take 2 mg by mouth at  bedtime.      No current facility-administered medications on file prior to visit.     PAST MEDICAL HISTORY: Past Medical History:  Diagnosis Date  . Allergy    sees [name]  . Anemia    hx/o iron deficiency due to heavy periods  . Asthma   . Bursitis   . DDD (degenerative disc disease), lumbar   . Fatty liver   . GERD (gastroesophageal reflux disease)    [name]  . H/O mammogram   . Hiatal hernia    small per EGD, [name]  . Hyperlipidemia 2012   was on pravastatin prior  . Migraine    prior eval with neurologist, Dr. Melton Alar  . Overactive bladder   . PMDD (premenstrual dysphoric disorder)   . Pyelonephritis    age 62yo  . Routine  gynecological examination    [place], [name], NP  . Uterine infection 07/2015   s/p novasure ablation  . Vitamin D deficiency   . Wears glasses     PAST SURGICAL HISTORY: Past Surgical History:  Procedure Laterality Date  . CHOLECYSTECTOMY    . COLONOSCOPY  2014   repeat 5 years; [name], baseline  . ESOPHAGOGASTRODUODENOSCOPY  2014   [name]  . FOOT SURGERY Left    morton's neuroma  . LASIK     [name]  . LUMBAR DISC SURGERY  07/2013   due to herniated disc, [name]  . NOVASURE ABLATION    . WISDOM TOOTH EXTRACTION      SOCIAL HISTORY: Social History   Tobacco Use  . Smoking status: Never Smoker  . Smokeless tobacco: Never Used  Substance Use Topics  . Alcohol use: Yes    Alcohol/week: 1.8 oz    Types: 3 Cans of beer per week    Comment: socially  . Drug use: No    FAMILY HISTORY: Family History  Problem Relation Age of Onset  . Lupus Mother   . Hypertension Mother   . Hyperlipidemia Mother   . Migraines Mother   . Kidney disease Mother   . Hypertension Father   . Diabetes Father   . Hyperlipidemia Father   . Depression Father   . Anxiety disorder Father   . Obesity Sister   . Hypertension Sister   . Diabetes Sister   . Heart disease Paternal Uncle        CABG  . Hyperlipidemia Paternal Uncle   . Cancer Paternal Uncle        multiple with colon cancer  . Cancer Cousin        breast  . Cancer Cousin        ovarian  . Ovarian cysts Daughter   . Stroke Neg Hx     ROS: Review of Systems  Constitutional: Negative for weight loss.  Cardiovascular: Negative for chest pain and claudication.  Gastrointestinal: Negative for nausea and vomiting.  Musculoskeletal: Negative for myalgias.       Negative muscle weakness    PHYSICAL EXAM: Blood pressure 99/66, pulse 72, temperature 98.2 F (36.8 C), temperature source Oral, height 5\' 10"  (1.778 m), weight 211 lb (95.7 kg), SpO2 99 %. Body mass index is 30.28  kg/m. Physical Exam  Constitutional: She is oriented to person, place, and time. She appears well-developed and well-nourished.  Cardiovascular: Normal rate.  Pulmonary/Chest: Effort normal.  Musculoskeletal: Normal range of motion.  Neurological: She is oriented to person, place, and time.  Skin: Skin is warm and  dry.  Psychiatric: She has a normal mood and affect. Her behavior is normal.  Vitals reviewed.   RECENT LABS AND TESTS: BMET    Component Value Date/Time   NA 139 11/03/2017 1153   K 4.4 11/03/2017 1153   CL 102 11/03/2017 1153   CO2 23 11/03/2017 1153   GLUCOSE 87 11/03/2017 1153   GLUCOSE 89 03/23/2017 0841   BUN 8 11/03/2017 1153   CREATININE 0.85 11/03/2017 1153   CREATININE 0.81 03/23/2017 0841   CALCIUM 9.2 11/03/2017 1153   GFRNONAA 83 11/03/2017 1153   GFRAA 96 11/03/2017 1153   Lab Results  Component Value Date   HGBA1C 5.5 11/03/2017   HGBA1C 5.5 03/23/2017   HGBA1C 5.4 02/17/2016   HGBA1C 5.6 02/13/2015   HGBA1C 5.7 (H) 08/21/2014   Lab Results  Component Value Date   INSULIN 10.7 11/03/2017   CBC    Component Value Date/Time   WBC 5.0 11/03/2017 1153   WBC 5.2 03/23/2017 0841   RBC 4.44 11/03/2017 1153   RBC 4.68 03/23/2017 0841   HGB 11.0 (L) 11/03/2017 1153   HCT 35.9 11/03/2017 1153   PLT 259 03/23/2017 0841   MCV 81 11/03/2017 1153   MCH 24.8 (L) 11/03/2017 1153   MCH 27.8 03/23/2017 0841   MCHC 30.6 (L) 11/03/2017 1153   MCHC 33.9 03/23/2017 0841   RDW 17.1 (H) 11/03/2017 1153   LYMPHSABS 1.6 11/03/2017 1153   MONOABS 1.5 (H) 07/30/2015 0521   EOSABS 0.0 11/03/2017 1153   BASOSABS 0.0 11/03/2017 1153   Iron/TIBC/Ferritin/ %Sat    Component Value Date/Time   IRON 97 02/13/2015 0001   TIBC 339 02/13/2015 0001   IRONPCTSAT 29 02/13/2015 0001   Lipid Panel     Component Value Date/Time   CHOL 235 (H) 11/03/2017 1153   TRIG 82 11/03/2017 1153   HDL 85 11/03/2017 1153   CHOLHDL 2.7 03/23/2017 0841   VLDL 24 02/17/2016  0728   LDLCALC 134 (H) 11/03/2017 1153   LDLCALC 119 (H) 03/23/2017 0841   Hepatic Function Panel     Component Value Date/Time   PROT 7.0 11/03/2017 1153   ALBUMIN 4.4 11/03/2017 1153   AST 21 11/03/2017 1153   ALT 23 11/03/2017 1153   ALKPHOS 88 11/03/2017 1153   BILITOT 0.3 11/03/2017 1153   BILIDIR 0.1 08/21/2014 0853   IBILI 0.2 08/21/2014 0853      Component Value Date/Time   TSH 1.480 11/03/2017 1153   TSH 2.63 02/17/2016 0728   TSH 2.697 08/09/2013 1031  Results for SHAKIA, SEBASTIANO (MRN [id number]) as of 01/05/2018 10:34  Ref. Range 11/03/2017 11:53  Vitamin D, 25-Hydroxy Latest Ref Range: 30.0 - 100.0 ng/mL 30.4    ASSESSMENT AND PLAN: Vitamin D deficiency - Plan: Vitamin D, Ergocalciferol, (DRISDOL) 50000 units CAPS capsule  Other hyperlipidemia  At risk for osteoporosis  Class 1 obesity with serious comorbidity and body mass index (BMI) of 30.0 to 30.9 in adult, unspecified obesity type  PLAN:  Vitamin D Deficiency Michelle Griffin was informed that low vitamin D levels contributes to fatigue and are associated with obesity, breast, and colon cancer. Michelle Griffin agrees to continue taking prescription Vit D @50 ,000 IU every week #4 and we will refill for 1 month. She will follow up for routine testing of vitamin D, at least 2-3 times per year. She was informed of the risk of over-replacement of vitamin D and agrees to not increase her dose unless she discusses this with [name] first.  Michelle Griffin agrees to follow up with our clinic in 3 weeks.  At risk for osteopenia and osteoporosis Michelle Griffin is at risk for osteopenia and osteoporsis due to her vitamin D deficiency. She was encouraged to take her vitamin D and follow her higher calcium diet and increase strengthening exercise to help strengthen her bones and decrease her risk of osteopenia and osteoporosis.  Hyperlipidemia Michelle Griffin was informed of the American Heart Association Guidelines emphasizing intensive lifestyle modifications as the first line  treatment for hyperlipidemia. We discussed many lifestyle modifications today in depth, and Michelle Griffin will continue to work on decreasing saturated fats such as fatty red meat, butter and many fried foods. She will also increase vegetables and lean protein in her diet and continue to work on diet, exercise, and weight loss efforts. Michelle Griffin agrees to follow up with our clinic in 3 weeks.  Obesity Michelle Griffin is currently in the action stage of change. As such, her goal is to continue with weight loss efforts She has agreed to keep a food journal with 450-600 calories and 40 grams of protein at supper daily and follow the Category 3 plan Michelle Griffin has been instructed to work up to a goal of 150 minutes of combined cardio and strengthening exercise per week for weight loss and overall health benefits. We discussed the following Behavioral Modification Strategies today: increasing lean protein intake and no skipping meals   Michelle Griffin has agreed to follow up with our clinic in 3 weeks. She was informed of the importance of frequent follow up visits to maximize her success with intensive lifestyle modifications for her multiple health conditions.   OBESITY BEHAVIORAL INTERVENTION VISIT  Today's visit was # 4 out of 22.  Starting weight: 211 lbs Starting date: 11/03/17 Today's weight : 211 lbs  Today's date: 01/04/2018 Total lbs lost to date: 0    ASK: We discussed the diagnosis of obesity with Michelle Griffin today and Michelle Griffin agreed to give [name] permission to discuss obesity behavioral modification therapy today.  ASSESS: Michelle Griffin has the diagnosis of obesity and her BMI today is 30.28 Michelle Griffin is in the action stage of change   ADVISE: Michelle Griffin was educated on the multiple health risks of obesity as well as the benefit of weight loss to improve her health. She was advised of the need for long term treatment and the importance of lifestyle modifications.  AGREE: Multiple dietary modification options and treatment options  were discussed and  Michelle Griffin agreed to the above obesity treatment plan.  Wilhemena Durie, am acting as transcriptionist for Abby Potash, PA-C I, Abby Potash, PA-C have reviewed above note and agree with its content

## 2018-01-13 DIAGNOSIS — J301 Allergic rhinitis due to pollen: Secondary | ICD-10-CM | POA: Diagnosis not present

## 2018-01-13 DIAGNOSIS — J3081 Allergic rhinitis due to animal (cat) (dog) hair and dander: Secondary | ICD-10-CM | POA: Diagnosis not present

## 2018-01-13 DIAGNOSIS — J3089 Other allergic rhinitis: Secondary | ICD-10-CM | POA: Diagnosis not present

## 2018-01-19 DIAGNOSIS — J3089 Other allergic rhinitis: Secondary | ICD-10-CM | POA: Diagnosis not present

## 2018-01-19 DIAGNOSIS — J3081 Allergic rhinitis due to animal (cat) (dog) hair and dander: Secondary | ICD-10-CM | POA: Diagnosis not present

## 2018-01-19 DIAGNOSIS — J301 Allergic rhinitis due to pollen: Secondary | ICD-10-CM | POA: Diagnosis not present

## 2018-01-21 DIAGNOSIS — J301 Allergic rhinitis due to pollen: Secondary | ICD-10-CM | POA: Diagnosis not present

## 2018-01-25 ENCOUNTER — Encounter (INDEPENDENT_AMBULATORY_CARE_PROVIDER_SITE_OTHER): Payer: Self-pay | Admitting: Physician Assistant

## 2018-01-25 ENCOUNTER — Ambulatory Visit (INDEPENDENT_AMBULATORY_CARE_PROVIDER_SITE_OTHER): Payer: 59 | Admitting: Physician Assistant

## 2018-01-25 VITALS — BP 103/70 | HR 75 | Temp 97.9°F | Ht 70.0 in | Wt 210.0 lb

## 2018-01-25 DIAGNOSIS — E559 Vitamin D deficiency, unspecified: Secondary | ICD-10-CM

## 2018-01-25 DIAGNOSIS — E669 Obesity, unspecified: Secondary | ICD-10-CM

## 2018-01-25 DIAGNOSIS — J301 Allergic rhinitis due to pollen: Secondary | ICD-10-CM | POA: Diagnosis not present

## 2018-01-25 DIAGNOSIS — Z6830 Body mass index (BMI) 30.0-30.9, adult: Secondary | ICD-10-CM

## 2018-01-25 DIAGNOSIS — J3081 Allergic rhinitis due to animal (cat) (dog) hair and dander: Secondary | ICD-10-CM | POA: Diagnosis not present

## 2018-01-25 DIAGNOSIS — Z9189 Other specified personal risk factors, not elsewhere classified: Secondary | ICD-10-CM

## 2018-01-25 DIAGNOSIS — E8881 Metabolic syndrome: Secondary | ICD-10-CM

## 2018-01-25 DIAGNOSIS — J3089 Other allergic rhinitis: Secondary | ICD-10-CM | POA: Diagnosis not present

## 2018-01-25 MED ORDER — VITAMIN D (ERGOCALCIFEROL) 1.25 MG (50000 UNIT) PO CAPS: 50000.0000 [IU] | ORAL_CAPSULE | ORAL | 0 refills | Status: DC

## 2018-01-26 MED FILL — VIT D2 1.25 MG (50,000 UNIT: 1.25 MG | 28 days supply | Qty: 4 | Fill #0

## 2018-01-26 NOTE — Progress Notes (Signed)
Office: [phone]  /  Fax: [phone]   HPI:   Chief Complaint: OBESITY Michelle Griffin is here to discuss her progress with her obesity treatment plan. She is on the Category 3 plan and is following her eating plan approximately 80 % of the time. She states she is exercising 0 minutes 0 times per week. Michelle Griffin reports that she has not been journaling over the last few weeks due to "being lazy". She has been eating out more. Michelle Griffin is ready to get back on track. Her weight is 210 lb (95.3 kg) today and has had a weight loss of 1 pound over a period of 3 weeks since her last visit. She has lost 1 lb since starting treatment with [name].  Vitamin D deficiency Michelle Griffin has a diagnosis of vitamin D deficiency. She is currently taking prescription vit D and denies nausea, vomiting or muscle weakness.  At risk for osteopenia and osteoporosis Michelle Griffin is at higher risk of osteopenia and osteoporosis due to vitamin D deficiency.   Insulin Resistance Michelle Griffin has a diagnosis of insulin resistance based on her elevated fasting insulin level >5. Although Michelle Griffin's blood glucose readings are still under good control, insulin resistance puts her at greater risk of metabolic syndrome and diabetes. She is not taking metformin currently and continues to work on diet and exercise to decrease risk of diabetes. Michelle Griffin denies polyphagia.  ALLERGIES: Allergies  Allergen Reactions  . Sulfa Antibiotics Hives    MEDICATIONS: Current Outpatient Medications on File Prior to Visit  Medication Sig Dispense Refill  . albuterol (PROVENTIL HFA;VENTOLIN HFA) 108 (90 BASE) MCG/ACT inhaler Inhale 2 puffs into the lungs every 6 (six) hours as needed for wheezing or shortness of breath. 1 Inhaler 0  . azelastine (ASTELIN) 0.1 % nasal spray Place 2 sprays into both nostrils 2 (two) times daily. Use in each nostril as directed 30 mL 0  . CONTRAVE 8-90 MG TB12 TAKE 2 TABLETS BY MOUTH 2 (TWO) TIMES DAILY. 120 tablet 2  . EPINEPHrine (EPI-PEN)  0.3 mg/0.3 mL SOAJ injection Inject 0.3 mg into the muscle once.    [name] esomeprazole (NEXIUM) 40 MG capsule Take 40 mg by mouth daily at 12 noon.    . famotidine (PEPCID) 40 MG tablet Take 1 tablet (40 mg total) by mouth daily. 90 tablet 3  . fluticasone (FLONASE) 50 MCG/ACT nasal spray Place into both nostrils daily.    [name] ibuprofen (ADVIL,MOTRIN) 200 MG tablet Take 600 mg by mouth every 4 (four) hours as needed.    . Lactobacillus (PROBIOTIC ACIDOPHILUS) CAPS Take 1 capsule by mouth daily.    [name] levocetirizine (XYZAL) 5 MG tablet Take 5 mg by mouth every evening.    . Melatonin 5 MG CAPS Take by mouth.    . methocarbamol (ROBAXIN) 500 MG tablet Take 1 tablet (500 mg total) by mouth 3 (three) times daily. (Patient taking differently: Take 500 mg by mouth every 6 (six) hours as needed. ) 20 tablet 0  . montelukast (SINGULAIR) 10 MG tablet Take 10 mg by mouth at bedtime.    . Multiple Vitamins-Minerals (MULTIVITAMIN WITH MINERALS) tablet Take 1 tablet by mouth daily.    . nitroGLYCERIN (NITRODUR - DOSED IN MG/24 HR) 0.2 mg/hr patch PLACE 1 PATCH (0.2 MG TOTAL) ONTO THE SKIN DAILY. 30 patch 11  . predniSONE (DELTASONE) 10 MG tablet Take 10 mg by mouth daily as needed.     . sertraline (ZOLOFT) 100 MG tablet TAKE 1 TABLET BY MOUTH ONCE DAILY 90  tablet 1  . tolterodine (DETROL) 2 MG tablet Take 2 mg by mouth at bedtime.      No current facility-administered medications on file prior to visit.     PAST MEDICAL HISTORY: Past Medical History:  Diagnosis Date  . Allergy    sees [name]  . Anemia    hx/o iron deficiency due to heavy periods  . Asthma   . Bursitis   . DDD (degenerative disc disease), lumbar   . Fatty liver   . GERD (gastroesophageal reflux disease)    [name]  . H/O mammogram   . Hiatal hernia    small per EGD, [name]  . Hyperlipidemia 2012   was on pravastatin prior  . Migraine    prior eval with neurologist, Dr. Melton Alar  . Overactive bladder   . PMDD (premenstrual  dysphoric disorder)   . Pyelonephritis    age 3yo  . Routine gynecological examination    McNabb, [name], NP  . Uterine infection 07/2015   s/p novasure ablation  . Vitamin D deficiency   . Wears glasses     PAST SURGICAL HISTORY: Past Surgical History:  Procedure Laterality Date  . CHOLECYSTECTOMY    . COLONOSCOPY  2014   repeat 5 years; [name], baseline  . ESOPHAGOGASTRODUODENOSCOPY  2014   [name]  . FOOT SURGERY Left    morton's neuroma  . LASIK     [name]  . LUMBAR DISC SURGERY  07/2013   due to herniated disc, [name]  . NOVASURE ABLATION    . WISDOM TOOTH EXTRACTION      SOCIAL HISTORY: Social History   Tobacco Use  . Smoking status: Never Smoker  . Smokeless tobacco: Never Used  Substance Use Topics  . Alcohol use: Yes    Alcohol/week: 3.0 standard drinks    Types: 3 Cans of beer per week    Comment: socially  . Drug use: No    FAMILY HISTORY: Family History  Problem Relation Age of Onset  . Lupus Mother   . Hypertension Mother   . Hyperlipidemia Mother   . Migraines Mother   . Kidney disease Mother   . Hypertension Father   . Diabetes Father   . Hyperlipidemia Father   . Depression Father   . Anxiety disorder Father   . Obesity Sister   . Hypertension Sister   . Diabetes Sister   . Heart disease Paternal Uncle        CABG  . Hyperlipidemia Paternal Uncle   . Cancer Paternal Uncle        multiple with colon cancer  . Cancer Cousin        breast  . Cancer Cousin        ovarian  . Ovarian cysts Daughter   . Stroke Neg Hx     ROS: Review of Systems  Constitutional: Positive for weight loss.  Gastrointestinal: Negative for nausea and vomiting.  Musculoskeletal:       Negative for muscle weakness  Endo/Heme/Allergies:       Negative for polyphagia    PHYSICAL EXAM: Blood pressure 103/70, pulse 75, temperature 97.9 F (36.6 C), temperature source Oral, height 5\' 10"  (1.778 m), weight 210 lb (95.3  kg), SpO2 99 %. Body mass index is 30.13 kg/m. Physical Exam  Constitutional: She is oriented to person, place, and time. She appears well-developed and well-nourished.  Cardiovascular: Normal rate.  Pulmonary/Chest: Effort normal.  Musculoskeletal: Normal range of  motion.  Neurological: She is oriented to person, place, and time.  Skin: Skin is warm and dry.  Psychiatric: She has a normal mood and affect. Her behavior is normal.  Vitals reviewed.   RECENT LABS AND TESTS: BMET    Component Value Date/Time   NA 139 11/03/2017 1153   K 4.4 11/03/2017 1153   CL 102 11/03/2017 1153   CO2 23 11/03/2017 1153   GLUCOSE 87 11/03/2017 1153   GLUCOSE 89 03/23/2017 0841   BUN 8 11/03/2017 1153   CREATININE 0.85 11/03/2017 1153   CREATININE 0.81 03/23/2017 0841   CALCIUM 9.2 11/03/2017 1153   GFRNONAA 83 11/03/2017 1153   GFRAA 96 11/03/2017 1153   Lab Results  Component Value Date   HGBA1C 5.5 11/03/2017   HGBA1C 5.5 03/23/2017   HGBA1C 5.4 02/17/2016   HGBA1C 5.6 02/13/2015   HGBA1C 5.7 (H) 08/21/2014   Lab Results  Component Value Date   INSULIN 10.7 11/03/2017   CBC    Component Value Date/Time   WBC 5.0 11/03/2017 1153   WBC 5.2 03/23/2017 0841   RBC 4.44 11/03/2017 1153   RBC 4.68 03/23/2017 0841   HGB 11.0 (L) 11/03/2017 1153   HCT 35.9 11/03/2017 1153   PLT 259 03/23/2017 0841   MCV 81 11/03/2017 1153   MCH 24.8 (L) 11/03/2017 1153   MCH 27.8 03/23/2017 0841   MCHC 30.6 (L) 11/03/2017 1153   MCHC 33.9 03/23/2017 0841   RDW 17.1 (H) 11/03/2017 1153   LYMPHSABS 1.6 11/03/2017 1153   MONOABS 1.5 (H) 07/30/2015 0521   EOSABS 0.0 11/03/2017 1153   BASOSABS 0.0 11/03/2017 1153   Iron/TIBC/Ferritin/ %Sat    Component Value Date/Time   IRON 97 02/13/2015 0001   TIBC 339 02/13/2015 0001   IRONPCTSAT 29 02/13/2015 0001   Lipid Panel     Component Value Date/Time   CHOL 235 (H) 11/03/2017 1153   TRIG 82 11/03/2017 1153   HDL 85 11/03/2017 1153   CHOLHDL  2.7 03/23/2017 0841   VLDL 24 02/17/2016 0728   LDLCALC 134 (H) 11/03/2017 1153   LDLCALC 119 (H) 03/23/2017 0841   Hepatic Function Panel     Component Value Date/Time   PROT 7.0 11/03/2017 1153   ALBUMIN 4.4 11/03/2017 1153   AST 21 11/03/2017 1153   ALT 23 11/03/2017 1153   ALKPHOS 88 11/03/2017 1153   BILITOT 0.3 11/03/2017 1153   BILIDIR 0.1 08/21/2014 0853   IBILI 0.2 08/21/2014 0853      Component Value Date/Time   TSH 1.480 11/03/2017 1153   TSH 2.63 02/17/2016 0728   TSH 2.697 08/09/2013 1031   Results for TRESIA, REVOLORIO (MRN [id number]) as of 01/26/2018 07:02  Ref. Range 11/03/2017 11:53  Vitamin D, 25-Hydroxy Latest Ref Range: 30.0 - 100.0 ng/mL 30.4   ASSESSMENT AND PLAN: Vitamin D deficiency - Plan: Vitamin D, Ergocalciferol, (DRISDOL) 50000 units CAPS capsule  Insulin resistance  At risk for osteoporosis  Class 1 obesity with serious comorbidity and body mass index (BMI) of 30.0 to 30.9 in adult, unspecified obesity type  PLAN:  Vitamin D Deficiency Michelle Griffin was informed that low vitamin D levels contributes to fatigue and are associated with obesity, breast, and colon cancer. She agrees to continue to take prescription Vit D @50 ,000 IU every week #4 with no refills and will follow up for routine testing of vitamin D, at least 2-3 times per year. She was informed of the risk of over-replacement of vitamin D  and agrees to not increase her dose unless she discusses this with [name] first. Michelle Griffin agreed to follow up as directed.  At risk for osteopenia and osteoporosis Michelle Griffin is at risk for osteopenia and osteoporosis due to her vitamin D deficiency. She was encouraged to take her vitamin D and follow her higher calcium diet and increase strengthening exercise to help strengthen her bones and decrease her risk of osteopenia and osteoporosis.  Insulin Resistance Michelle Griffin will continue to work on weight loss, exercise, and decreasing simple carbohydrates in her diet to help  decrease the risk of diabetes. She was informed that eating too many simple carbohydrates or too many calories at one sitting increases the likelihood of GI side effects. Michelle Griffin agreed to follow up with [name] as directed to monitor her progress.  Obesity Michelle Griffin is currently in the action stage of change. As such, her goal is to continue with weight loss efforts She has agreed to keep a food journal with 450 to 600 calories and 40 grams of protein at supper daily and follow the Category 3 plan Michelle Griffin has been instructed to work up to a goal of 150 minutes of combined cardio and strengthening exercise per week for weight loss and overall health benefits. We discussed the following Behavioral Modification Strategies today: keeping healthy foods in the home, decrease eating out and work on meal planning and easy cooking plans  Michelle Griffin has agreed to follow up with our clinic in 3 weeks. She was informed of the importance of frequent follow up visits to maximize her success with intensive lifestyle modifications for her multiple health conditions.   OBESITY BEHAVIORAL INTERVENTION VISIT  Today's visit was # 5   Starting weight: 211 lbs Starting date: 11/03/17 Today's weight : 210 lbs  Today's date: 01/25/2018 Total lbs lost to date: 1 At least 15 minutes were spent on discussing the following behavioral intervention visit.   ASK: We discussed the diagnosis of obesity with Michelle Griffin today and Michelle Griffin agreed to give [name] permission to discuss obesity behavioral modification therapy today.  ASSESS: Michelle Griffin has the diagnosis of obesity and her BMI today is 30.13 Michelle Griffin is in the action stage of change   ADVISE: Michelle Griffin was educated on the multiple health risks of obesity as well as the benefit of weight loss to improve her health. She was advised of the need for long term treatment and the importance of lifestyle modifications to improve her current health and to decrease her risk of future health  problems.  AGREE: Multiple dietary modification options and treatment options were discussed and  Michelle Griffin agreed to follow the recommendations documented in the above note.  ARRANGE: Mykeria was educated on the importance of frequent visits to treat obesity as outlined per CMS and USPHS guidelines and agreed to schedule her next follow up appointment today.  Corey Skains, am acting as transcriptionist for Abby Potash, PA-C I, Abby Potash, PA-C have reviewed above note and agree with its content

## 2018-01-27 MED FILL — LEVOCETIRIZINE 5 MG TABLET: 5 | 90 days supply | Qty: 90 | Fill #0

## 2018-01-27 MED FILL — MONTELUKAST SOD 10 MG TAB: 10 | 90 days supply | Qty: 90 | Fill #0

## 2018-02-03 MED FILL — FLUTICASONE PROP 50 MCG SPR: 50 | 30 days supply | Qty: 16 | Fill #1

## 2018-02-03 MED FILL — AZELASTINE HCL 137 MCG/SPRA: 137 | 50 days supply | Qty: 30 | Fill #0

## 2018-02-08 DIAGNOSIS — J3081 Allergic rhinitis due to animal (cat) (dog) hair and dander: Secondary | ICD-10-CM | POA: Diagnosis not present

## 2018-02-08 DIAGNOSIS — J3089 Other allergic rhinitis: Secondary | ICD-10-CM | POA: Diagnosis not present

## 2018-02-08 DIAGNOSIS — J301 Allergic rhinitis due to pollen: Secondary | ICD-10-CM | POA: Diagnosis not present

## 2018-02-09 ENCOUNTER — Other Ambulatory Visit: Payer: Self-pay | Admitting: Medical

## 2018-02-09 NOTE — Telephone Encounter (Signed)
Is this ok to refill?  

## 2018-02-10 NOTE — Telephone Encounter (Signed)
I received refill request on Contrave.  Since she is currently enrolled in the medical weight management, I'll defer this refill to them since they are managing that aspect of her care currently.

## 2018-02-11 ENCOUNTER — Other Ambulatory Visit (INDEPENDENT_AMBULATORY_CARE_PROVIDER_SITE_OTHER): Payer: Self-pay | Admitting: Physician Assistant

## 2018-02-11 DIAGNOSIS — E559 Vitamin D deficiency, unspecified: Secondary | ICD-10-CM

## 2018-02-15 ENCOUNTER — Telehealth (INDEPENDENT_AMBULATORY_CARE_PROVIDER_SITE_OTHER): Payer: Self-pay | Admitting: Physician Assistant

## 2018-02-15 ENCOUNTER — Ambulatory Visit (INDEPENDENT_AMBULATORY_CARE_PROVIDER_SITE_OTHER): Payer: Self-pay | Admitting: Physician Assistant

## 2018-02-15 NOTE — Telephone Encounter (Signed)
Pt called and r/s appt for today to 10/02; needs refill for her Contrave called to [facility]. drh

## 2018-02-16 ENCOUNTER — Other Ambulatory Visit (INDEPENDENT_AMBULATORY_CARE_PROVIDER_SITE_OTHER): Payer: Self-pay

## 2018-02-16 DIAGNOSIS — J3081 Allergic rhinitis due to animal (cat) (dog) hair and dander: Secondary | ICD-10-CM | POA: Diagnosis not present

## 2018-02-16 DIAGNOSIS — J3089 Other allergic rhinitis: Secondary | ICD-10-CM | POA: Diagnosis not present

## 2018-02-16 DIAGNOSIS — J301 Allergic rhinitis due to pollen: Secondary | ICD-10-CM | POA: Diagnosis not present

## 2018-02-16 MED ORDER — NALTREXONE-BUPROPION HCL ER 8-90 MG PO TB12: ORAL_TABLET | ORAL | 0 refills | Status: DC

## 2018-02-16 NOTE — Telephone Encounter (Signed)
Medication sent to pharmacy  

## 2018-02-23 MED FILL — SERTRALINE HCL 100 MG TAB: 100 | 90 days supply | Qty: 90 | Fill #1

## 2018-02-24 DIAGNOSIS — J3089 Other allergic rhinitis: Secondary | ICD-10-CM | POA: Diagnosis not present

## 2018-02-24 DIAGNOSIS — J3081 Allergic rhinitis due to animal (cat) (dog) hair and dander: Secondary | ICD-10-CM | POA: Diagnosis not present

## 2018-02-24 DIAGNOSIS — J301 Allergic rhinitis due to pollen: Secondary | ICD-10-CM | POA: Diagnosis not present

## 2018-03-02 ENCOUNTER — Encounter (INDEPENDENT_AMBULATORY_CARE_PROVIDER_SITE_OTHER): Payer: Self-pay | Admitting: Physician Assistant

## 2018-03-02 ENCOUNTER — Ambulatory Visit (INDEPENDENT_AMBULATORY_CARE_PROVIDER_SITE_OTHER): Payer: 59 | Admitting: Physician Assistant

## 2018-03-02 VITALS — BP 93/65 | HR 85 | Temp 98.3°F | Ht 70.0 in | Wt 210.0 lb

## 2018-03-02 DIAGNOSIS — Z6830 Body mass index (BMI) 30.0-30.9, adult: Secondary | ICD-10-CM

## 2018-03-02 DIAGNOSIS — E8881 Metabolic syndrome: Secondary | ICD-10-CM

## 2018-03-02 DIAGNOSIS — E669 Obesity, unspecified: Secondary | ICD-10-CM | POA: Diagnosis not present

## 2018-03-02 DIAGNOSIS — Z9189 Other specified personal risk factors, not elsewhere classified: Secondary | ICD-10-CM

## 2018-03-02 DIAGNOSIS — E559 Vitamin D deficiency, unspecified: Secondary | ICD-10-CM

## 2018-03-02 DIAGNOSIS — E7849 Other hyperlipidemia: Secondary | ICD-10-CM | POA: Diagnosis not present

## 2018-03-02 DIAGNOSIS — D508 Other iron deficiency anemias: Secondary | ICD-10-CM

## 2018-03-02 MED ORDER — VITAMIN D (ERGOCALCIFEROL) 1.25 MG (50000 UNIT) PO CAPS: 50000.0000 [IU] | ORAL_CAPSULE | ORAL | 0 refills | Status: DC

## 2018-03-02 MED FILL — CONTRAVE ER 8-90 MG TABLET: 8-90 | 30 days supply | Qty: 120 | Fill #0

## 2018-03-02 NOTE — Progress Notes (Signed)
Office: [phone]  /  Fax: [phone]   HPI:   Chief Complaint: OBESITY Michelle Griffin is here to discuss her progress with her obesity treatment plan. She is on the Category 3 plan and is following her eating plan approximately 50 % of the time. She states she is exercising 0 minutes 0 times per week. Michelle Griffin struggled to follow Category 3 as she ran out of her Contrave. She states that she has uncontrollable cravings. She is ready to get back on track.  Her weight is 210 lb (95.3 kg) today and has not lost weight since her last visit. She has lost 1 lb since starting treatment with [name].  Vitamin D deficiency Michelle Griffin has a diagnosis of vitamin D deficiency. She is currently taking prescription vit D and denies nausea, vomiting or muscle weakness.  At risk for osteopenia and osteoporosis Michelle Griffin is at higher risk of osteopenia and osteoporosis due to vitamin D deficiency.   Hyperlipidemia Michelle Griffin has hyperlipidemia and has been trying to improve her cholesterol levels with intensive lifestyle modification including a low saturated fat diet, exercise and weight loss. She is not taking any medications. She denies any chest pain.  Insulin Resistance Michelle Griffin has a diagnosis of insulin resistance based on her elevated fasting insulin level >5. Although Michelle Griffin's blood glucose readings are still under good control, insulin resistance puts her at greater risk of metabolic syndrome and diabetes. She reports polyphagia. She stopped Contrave due to running out. She is not taking metformin currently and continues to work on diet and exercise to decrease risk of diabetes.   Anemia Michelle Griffin's last Hgb was not at goal. She reports that she has a history of anemia, but that she gave blood prior to her last labs.  ALLERGIES: Allergies  Allergen Reactions  . Sulfa Antibiotics Hives    MEDICATIONS: Current Outpatient Medications on File Prior to Visit  Medication Sig Dispense Refill  . albuterol (PROVENTIL  HFA;VENTOLIN HFA) 108 (90 BASE) MCG/ACT inhaler Inhale 2 puffs into the lungs every 6 (six) hours as needed for wheezing or shortness of breath. 1 Inhaler 0  . azelastine (ASTELIN) 0.1 % nasal spray Place 2 sprays into both nostrils 2 (two) times daily. Use in each nostril as directed 30 mL 0  . EPINEPHrine (EPI-PEN) 0.3 mg/0.3 mL SOAJ injection Inject 0.3 mg into the muscle once.    Michelle Griffin esomeprazole (NEXIUM) 40 MG capsule Take 40 mg by mouth daily at 12 noon.    . famotidine (PEPCID) 40 MG tablet Take 1 tablet (40 mg total) by mouth daily. 90 tablet 3  . fluticasone (FLONASE) 50 MCG/ACT nasal spray Place into both nostrils daily.    Michelle Griffin ibuprofen (ADVIL,MOTRIN) 200 MG tablet Take 600 mg by mouth every 4 (four) hours as needed.    . Lactobacillus (PROBIOTIC ACIDOPHILUS) CAPS Take 1 capsule by mouth daily.    Michelle Griffin levocetirizine (XYZAL) 5 MG tablet Take 5 mg by mouth every evening.    . Melatonin 5 MG CAPS Take by mouth.    . methocarbamol (ROBAXIN) 500 MG tablet Take 1 tablet (500 mg total) by mouth 3 (three) times daily. (Patient taking differently: Take 500 mg by mouth every 6 (six) hours as needed. ) 20 tablet 0  . montelukast (SINGULAIR) 10 MG tablet Take 10 mg by mouth at bedtime.    . Multiple Vitamins-Minerals (MULTIVITAMIN WITH MINERALS) tablet Take 1 tablet by mouth daily.    . Naltrexone-buPROPion HCl ER (CONTRAVE) 8-90 MG TB12 TAKE 2 TABLETS BY  MOUTH 2 (TWO) TIMES DAILY. 120 tablet 0  . nitroGLYCERIN (NITRODUR - DOSED IN MG/24 HR) 0.2 mg/hr patch PLACE 1 PATCH (0.2 MG TOTAL) ONTO THE SKIN DAILY. 30 patch 11  . predniSONE (DELTASONE) 10 MG tablet Take 10 mg by mouth daily as needed.     . sertraline (ZOLOFT) 100 MG tablet TAKE 1 TABLET BY MOUTH ONCE DAILY 90 tablet 1  . tolterodine (DETROL) 2 MG tablet Take 2 mg by mouth at bedtime.      No current facility-administered medications on file prior to visit.     PAST MEDICAL HISTORY: Past Medical History:  Diagnosis Date  . Allergy    sees  [name]  . Anemia    hx/o iron deficiency due to heavy periods  . Asthma   . Bursitis   . DDD (degenerative disc disease), lumbar   . Fatty liver   . GERD (gastroesophageal reflux disease)    [name]  . H/O mammogram   . Hiatal hernia    small per EGD, [name]  . Hyperlipidemia 2012   was on pravastatin prior  . Migraine    prior eval with neurologist, Dr. Melton Alar  . Overactive bladder   . PMDD (premenstrual dysphoric disorder)   . Pyelonephritis    age 13yo  . Routine gynecological examination    [name], [name], NP  . Uterine infection 07/2015   s/p novasure ablation  . Vitamin D deficiency   . Wears glasses     PAST SURGICAL HISTORY: Past Surgical History:  Procedure Laterality Date  . CHOLECYSTECTOMY    . COLONOSCOPY  2014   repeat 5 years; [name], baseline  . ESOPHAGOGASTRODUODENOSCOPY  2014   [name]  . FOOT SURGERY Left    morton's neuroma  . LASIK     [name]  . LUMBAR DISC SURGERY  07/2013   due to herniated disc, [name]  . NOVASURE ABLATION    . WISDOM TOOTH EXTRACTION      SOCIAL HISTORY: Social History   Tobacco Use  . Smoking status: Never Smoker  . Smokeless tobacco: Never Used  Substance Use Topics  . Alcohol use: Yes    Alcohol/week: 3.0 standard drinks    Types: 3 Cans of beer per week    Comment: socially  . Drug use: No    FAMILY HISTORY: Family History  Problem Relation Age of Onset  . Lupus Mother   . Hypertension Mother   . Hyperlipidemia Mother   . Migraines Mother   . Kidney disease Mother   . Hypertension Father   . Diabetes Father   . Hyperlipidemia Father   . Depression Father   . Anxiety disorder Father   . Obesity Sister   . Hypertension Sister   . Diabetes Sister   . Heart disease Paternal Uncle        CABG  . Hyperlipidemia Paternal Uncle   . Cancer Paternal Uncle        multiple with colon cancer  . Cancer Cousin        breast  . Cancer Cousin        ovarian  .  Ovarian cysts Daughter   . Stroke Neg Hx     ROS: Review of Systems  Constitutional: Negative for weight loss.  Cardiovascular: Negative for chest pain.  Gastrointestinal: Negative for nausea and vomiting.  Musculoskeletal:       Negative for muscle weakness.  Endo/Heme/Allergies:  Positive for polyphagia.    PHYSICAL EXAM: Blood pressure 93/65, pulse 85, temperature 98.3 F (36.8 C), temperature source Oral, height 5\' 10"  (1.778 m), weight 210 lb (95.3 kg), SpO2 98 %. Body mass index is 30.13 kg/m. Physical Exam  Constitutional: She is oriented to person, place, and time. She appears well-developed and well-nourished.  Cardiovascular: Normal rate.  Pulmonary/Chest: Effort normal.  Musculoskeletal: Normal range of motion.  Neurological: She is oriented to person, place, and time.  Skin: Skin is warm and dry.  Psychiatric: She has a normal mood and affect. Her behavior is normal.  Vitals reviewed.   RECENT LABS AND TESTS: BMET    Component Value Date/Time   NA 139 11/03/2017 1153   K 4.4 11/03/2017 1153   CL 102 11/03/2017 1153   CO2 23 11/03/2017 1153   GLUCOSE 87 11/03/2017 1153   GLUCOSE 89 03/23/2017 0841   BUN 8 11/03/2017 1153   CREATININE 0.85 11/03/2017 1153   CREATININE 0.81 03/23/2017 0841   CALCIUM 9.2 11/03/2017 1153   GFRNONAA 83 11/03/2017 1153   GFRAA 96 11/03/2017 1153   Lab Results  Component Value Date   HGBA1C 5.5 11/03/2017   HGBA1C 5.5 03/23/2017   HGBA1C 5.4 02/17/2016   HGBA1C 5.6 02/13/2015   HGBA1C 5.7 (H) 08/21/2014   Lab Results  Component Value Date   INSULIN 10.7 11/03/2017   CBC    Component Value Date/Time   WBC 5.0 11/03/2017 1153   WBC 5.2 03/23/2017 0841   RBC 4.44 11/03/2017 1153   RBC 4.68 03/23/2017 0841   HGB 11.0 (L) 11/03/2017 1153   HCT 35.9 11/03/2017 1153   PLT 259 03/23/2017 0841   MCV 81 11/03/2017 1153   MCH 24.8 (L) 11/03/2017 1153   MCH 27.8 03/23/2017 0841   MCHC 30.6 (L) 11/03/2017 1153    MCHC 33.9 03/23/2017 0841   RDW 17.1 (H) 11/03/2017 1153   LYMPHSABS 1.6 11/03/2017 1153   MONOABS 1.5 (H) 07/30/2015 0521   EOSABS 0.0 11/03/2017 1153   BASOSABS 0.0 11/03/2017 1153   Iron/TIBC/Ferritin/ %Sat    Component Value Date/Time   IRON 97 02/13/2015 0001   TIBC 339 02/13/2015 0001   IRONPCTSAT 29 02/13/2015 0001   Lipid Panel     Component Value Date/Time   CHOL 235 (H) 11/03/2017 1153   TRIG 82 11/03/2017 1153   HDL 85 11/03/2017 1153   CHOLHDL 2.7 03/23/2017 0841   VLDL 24 02/17/2016 0728   LDLCALC 134 (H) 11/03/2017 1153   LDLCALC 119 (H) 03/23/2017 0841   Hepatic Function Panel     Component Value Date/Time   PROT 7.0 11/03/2017 1153   ALBUMIN 4.4 11/03/2017 1153   AST 21 11/03/2017 1153   ALT 23 11/03/2017 1153   ALKPHOS 88 11/03/2017 1153   BILITOT 0.3 11/03/2017 1153   BILIDIR 0.1 08/21/2014 0853   IBILI 0.2 08/21/2014 0853      Component Value Date/Time   TSH 1.480 11/03/2017 1153   TSH 2.63 02/17/2016 0728   TSH 2.697 08/09/2013 1031   Results for LOA, IDLER (MRN [id number]) as of 03/02/2018 13:28  Ref. Range 11/03/2017 11:53  Vitamin D, 25-Hydroxy Latest Ref Range: 30.0 - 100.0 ng/mL 30.4   ASSESSMENT AND PLAN: Vitamin D deficiency - Plan: VITAMIN D 25 Hydroxy (Vit-D Deficiency, Fractures), Vitamin D, Ergocalciferol, (DRISDOL) 50000 units CAPS capsule  Other hyperlipidemia  Insulin resistance - Plan: Comprehensive metabolic panel, Hemoglobin A1c, Insulin, random  Other iron deficiency anemia - Plan:  CBC With Differential  At risk for osteoporosis  Class 1 obesity with serious comorbidity and body mass index (BMI) of 30.0 to 30.9 in adult, unspecified obesity type  PLAN:  Vitamin D Deficiency Willia was informed that low vitamin D levels contributes to fatigue and are associated with obesity, breast, and colon cancer. She agrees to continue to take prescription Vit D @50 ,000 IU every week #4 with no refills and will follow up for routine  testing of vitamin D, at least 2-3 times per year. She was informed of the risk of over-replacement of vitamin D and agrees to not increase her dose unless she discusses this with [name] first. We will check labs today. Starletta agrees to follow up in 2 weeks.  At risk for osteopenia and osteoporosis Oluwatoyin was given extended (15 minutes) osteoporosis prevention counseling today. Zarriah is at risk for osteopenia and osteoporosis due to her vitamin D deficiency. She was encouraged to take her vitamin D and follow her higher calcium diet and increase strengthening exercise to help strengthen her bones and decrease her risk of osteopenia and osteoporosis.  Hyperlipidemia Terianne was informed of the American Heart Association Guidelines emphasizing intensive lifestyle modifications as the first line treatment for hyperlipidemia. We discussed many lifestyle modifications today in depth, and Laurian will continue to work on decreasing saturated fats such as fatty red meat, butter and many fried foods. She will also increase vegetables and lean protein in her diet and continue to work on exercise and weight loss efforts. We will check labs today. She agrees to continue with her diet and weight loss.   Insulin Resistance Rhealyn will continue to work on weight loss, exercise, and decreasing simple carbohydrates in her diet to help decrease the risk of diabetes.She was informed that eating too many simple carbohydrates or too many calories at one sitting increases the likelihood of GI side effects. We will check labs today. Lovette agrees to continue with her diet and weight loss. She agrees to follow up in 2 weeks.  Anemia We will check labs today and see her back in 2 weeks.  Obesity Tiarna is currently in the action stage of change. As such, her goal is to continue with weight loss efforts. She has agreed to follow the Category 3 plan. Malayzia has been instructed to work up to a goal of 150 minutes of combined cardio and  strengthening exercise per week for weight loss and overall health benefits. We discussed the following Behavioral Modification Strategies today: work on meal planning and easy cooking plans, keeping healthy foods in the home, and better snacking choice. We discussed various medication options to help Yarielis with her weight loss efforts and we both agreed to restart Contrave 8-90mg .  Laportia has agreed to follow up with our clinic in 2 weeks. She was informed of the importance of frequent follow up visits to maximize her success with intensive lifestyle modifications for her multiple health conditions.   OBESITY BEHAVIORAL INTERVENTION VISIT  Today's visit was # 6   Starting weight: 211 lbs Starting date: 11/03/17 Today's weight : Weight: 210 lb (95.3 kg)  Today's date: 03/02/2018 Total lbs lost to date: 1  ASK: We discussed the diagnosis of obesity with Zemira H Mamula today and Mykenzie agreed to give [name] permission to discuss obesity behavioral modification therapy today.  ASSESS: Marit has the diagnosis of obesity and her BMI today is 30.13. Alexius is in the action stage of change.   ADVISE: Jakai was educated on  the multiple health risks of obesity as well as the benefit of weight loss to improve her health. She was advised of the need for long term treatment and the importance of lifestyle modifications to improve her current health and to decrease her risk of future health problems.  AGREE: Multiple dietary modification options and treatment options were discussed and Shaquaya agreed to follow the recommendations documented in the above note.  ARRANGE: Nicole was educated on the importance of frequent visits to treat obesity as outlined per CMS and USPSTF guidelines and agreed to schedule her next follow up appointment today.  Lenward Chancellor, am acting as transcriptionist for Abby Potash, PA-C I, Abby Potash, PA-C have reviewed above note and agree with its content

## 2018-03-03 DIAGNOSIS — J301 Allergic rhinitis due to pollen: Secondary | ICD-10-CM | POA: Diagnosis not present

## 2018-03-03 DIAGNOSIS — J3089 Other allergic rhinitis: Secondary | ICD-10-CM | POA: Diagnosis not present

## 2018-03-03 DIAGNOSIS — J3081 Allergic rhinitis due to animal (cat) (dog) hair and dander: Secondary | ICD-10-CM | POA: Diagnosis not present

## 2018-03-03 LAB — CBC WITH DIFFERENTIAL
BASOS ABS: 0.1 10*3/uL (ref 0.0–0.2)
Basos: 1 %
EOS (ABSOLUTE): 0 10*3/uL (ref 0.0–0.4)
Eos: 1 %
Hematocrit: 41.4 % (ref 34.0–46.6)
Hemoglobin: 13.3 g/dL (ref 11.1–15.9)
Immature Grans (Abs): 0 10*3/uL (ref 0.0–0.1)
Immature Granulocytes: 0 %
LYMPHS ABS: 1.5 10*3/uL (ref 0.7–3.1)
Lymphs: 29 %
MCH: 25.2 pg — AB (ref 26.6–33.0)
MCHC: 32.1 g/dL (ref 31.5–35.7)
MCV: 79 fL (ref 79–97)
MONOS ABS: 0.6 10*3/uL (ref 0.1–0.9)
Monocytes: 12 %
NEUTROS ABS: 3 10*3/uL (ref 1.4–7.0)
NEUTROS PCT: 57 %
RBC: 5.27 x10E6/uL (ref 3.77–5.28)
RDW: 17 % — ABNORMAL HIGH (ref 12.3–15.4)
WBC: 5.1 10*3/uL (ref 3.4–10.8)

## 2018-03-03 LAB — COMPREHENSIVE METABOLIC PANEL
A/G RATIO: 1.3 (ref 1.2–2.2)
ALT: 20 IU/L (ref 0–32)
AST: 20 IU/L (ref 0–40)
Albumin: 4.1 g/dL (ref 3.5–5.5)
Alkaline Phosphatase: 89 IU/L (ref 39–117)
BUN / CREAT RATIO: 17 (ref 9–23)
BUN: 13 mg/dL (ref 6–24)
Bilirubin Total: 0.2 mg/dL (ref 0.0–1.2)
CALCIUM: 9.5 mg/dL (ref 8.7–10.2)
CO2: 24 mmol/L (ref 20–29)
Chloride: 102 mmol/L (ref 96–106)
Creatinine, Ser: 0.78 mg/dL (ref 0.57–1.00)
GFR, EST AFRICAN AMERICAN: 106 mL/min/{1.73_m2} (ref >59)
GFR, EST NON AFRICAN AMERICAN: 92 mL/min/{1.73_m2} (ref >59)
Globulin, Total: 3.2 g/dL (ref 1.5–4.5)
Glucose: 91 mg/dL (ref 65–99)
POTASSIUM: 4.6 mmol/L (ref 3.5–5.2)
SODIUM: 138 mmol/L (ref 134–144)
TOTAL PROTEIN: 7.3 g/dL (ref 6.0–8.5)

## 2018-03-03 LAB — VITAMIN D 25 HYDROXY (VIT D DEFICIENCY, FRACTURES): Vit D, 25-Hydroxy: 38.9 ng/mL (ref 30.0–100.0)

## 2018-03-03 LAB — INSULIN, RANDOM: INSULIN: 14 u[IU]/mL (ref 2.6–24.9)

## 2018-03-03 LAB — HEMOGLOBIN A1C
Est. average glucose Bld gHb Est-mCnc: 117 mg/dL
HEMOGLOBIN A1C: 5.7 % — AB (ref 4.8–5.6)

## 2018-03-16 ENCOUNTER — Encounter (INDEPENDENT_AMBULATORY_CARE_PROVIDER_SITE_OTHER): Payer: Self-pay

## 2018-03-16 ENCOUNTER — Ambulatory Visit (INDEPENDENT_AMBULATORY_CARE_PROVIDER_SITE_OTHER): Payer: 59 | Admitting: Physician Assistant

## 2018-03-18 DIAGNOSIS — J3089 Other allergic rhinitis: Secondary | ICD-10-CM | POA: Diagnosis not present

## 2018-03-18 DIAGNOSIS — J4520 Mild intermittent asthma, uncomplicated: Secondary | ICD-10-CM | POA: Diagnosis not present

## 2018-03-18 DIAGNOSIS — J3081 Allergic rhinitis due to animal (cat) (dog) hair and dander: Secondary | ICD-10-CM | POA: Diagnosis not present

## 2018-03-18 DIAGNOSIS — J301 Allergic rhinitis due to pollen: Secondary | ICD-10-CM | POA: Diagnosis not present

## 2018-03-18 DIAGNOSIS — H1045 Other chronic allergic conjunctivitis: Secondary | ICD-10-CM | POA: Diagnosis not present

## 2018-03-18 MED FILL — AZELASTINE HCL 0.05% DROPS: 0.05 | 60 days supply | Qty: 6 | Fill #0

## 2018-03-18 MED FILL — VENTOLIN HFA 90 MCG INHALER: 108 (90 BAS | 16 days supply | Qty: 18 | Fill #0

## 2018-03-18 MED FILL — AZELASTINE HCL 137 MCG/SPRA: 137 | 30 days supply | Qty: 30 | Fill #0

## 2018-03-21 DIAGNOSIS — J3081 Allergic rhinitis due to animal (cat) (dog) hair and dander: Secondary | ICD-10-CM | POA: Diagnosis not present

## 2018-03-21 DIAGNOSIS — J3089 Other allergic rhinitis: Secondary | ICD-10-CM | POA: Diagnosis not present

## 2018-03-29 DIAGNOSIS — J3089 Other allergic rhinitis: Secondary | ICD-10-CM | POA: Diagnosis not present

## 2018-03-29 DIAGNOSIS — J301 Allergic rhinitis due to pollen: Secondary | ICD-10-CM | POA: Diagnosis not present

## 2018-03-29 DIAGNOSIS — J3081 Allergic rhinitis due to animal (cat) (dog) hair and dander: Secondary | ICD-10-CM | POA: Diagnosis not present

## 2018-04-11 DIAGNOSIS — J3081 Allergic rhinitis due to animal (cat) (dog) hair and dander: Secondary | ICD-10-CM | POA: Diagnosis not present

## 2018-04-11 DIAGNOSIS — J301 Allergic rhinitis due to pollen: Secondary | ICD-10-CM | POA: Diagnosis not present

## 2018-04-11 DIAGNOSIS — J3089 Other allergic rhinitis: Secondary | ICD-10-CM | POA: Diagnosis not present

## 2018-04-13 ENCOUNTER — Other Ambulatory Visit: Payer: Self-pay | Admitting: Medical

## 2018-04-13 MED FILL — FAMOTIDINE 40 MG TABS: 40 | 90 days supply | Qty: 90 | Fill #0

## 2018-04-15 DIAGNOSIS — M7061 Trochanteric bursitis, right hip: Secondary | ICD-10-CM | POA: Diagnosis not present

## 2018-04-15 DIAGNOSIS — M25551 Pain in right hip: Secondary | ICD-10-CM | POA: Diagnosis not present

## 2018-04-15 MED FILL — MELOXICAM 7.5 MG TABLET: 7.5 | 30 days supply | Qty: 30 | Fill #0

## 2018-04-15 MED FILL — traMADol HCL 50 MG TABS: 50 | 5 days supply | Qty: 10 | Fill #0

## 2018-04-21 ENCOUNTER — Ambulatory Visit (INDEPENDENT_AMBULATORY_CARE_PROVIDER_SITE_OTHER): Payer: 59 | Admitting: Physician Assistant

## 2018-04-21 ENCOUNTER — Encounter (INDEPENDENT_AMBULATORY_CARE_PROVIDER_SITE_OTHER): Payer: Self-pay | Admitting: Physician Assistant

## 2018-04-21 VITALS — BP 109/71 | HR 87 | Temp 98.1°F | Ht 70.0 in | Wt 214.0 lb

## 2018-04-21 DIAGNOSIS — Z9189 Other specified personal risk factors, not elsewhere classified: Secondary | ICD-10-CM

## 2018-04-21 DIAGNOSIS — J3089 Other allergic rhinitis: Secondary | ICD-10-CM | POA: Diagnosis not present

## 2018-04-21 DIAGNOSIS — E8881 Metabolic syndrome: Secondary | ICD-10-CM | POA: Diagnosis not present

## 2018-04-21 DIAGNOSIS — Z6830 Body mass index (BMI) 30.0-30.9, adult: Secondary | ICD-10-CM | POA: Diagnosis not present

## 2018-04-21 DIAGNOSIS — E669 Obesity, unspecified: Secondary | ICD-10-CM

## 2018-04-21 DIAGNOSIS — E559 Vitamin D deficiency, unspecified: Secondary | ICD-10-CM

## 2018-04-21 DIAGNOSIS — J301 Allergic rhinitis due to pollen: Secondary | ICD-10-CM | POA: Diagnosis not present

## 2018-04-21 DIAGNOSIS — J3081 Allergic rhinitis due to animal (cat) (dog) hair and dander: Secondary | ICD-10-CM | POA: Diagnosis not present

## 2018-04-21 DIAGNOSIS — E88819 Insulin resistance, unspecified: Secondary | ICD-10-CM

## 2018-04-21 DIAGNOSIS — E66811 Obesity, class 1: Secondary | ICD-10-CM

## 2018-04-21 MED ORDER — VITAMIN D (ERGOCALCIFEROL) 1.25 MG (50000 UNIT) PO CAPS
50000.0000 [IU] | ORAL_CAPSULE | ORAL | 0 refills | Status: DC
Start: 2018-04-21 — End: 2018-06-21

## 2018-04-21 MED FILL — VIT D2 1.25 MG (50,000 UNIT: 1.25 MG | 28 days supply | Qty: 4 | Fill #0

## 2018-04-25 NOTE — Progress Notes (Signed)
Office: [phone]  /  Fax: [phone]   HPI:   Chief Complaint: OBESITY Michelle Griffin is here to discuss her progress with her obesity treatment plan. She is on the Category 3 plan and is following her eating plan approximately 75 % of the time. She states she is exercising 0 minutes 0 times per week. Michelle Griffin reports getting off track with her plan. She has not been eating dinner on the plan, but instead eating soup and grilled cheese. She is ready to get back on track.  Her weight is 214 lb (97.1 kg) today and has gained 4 pounds since her last visit. She has lost 0 lbs since starting treatment with [name].  Vitamin D Deficiency Michelle Griffin has a diagnosis of vitamin D deficiency. She is currently taking prescription Vit D and denies nausea, vomiting or muscle weakness.  At risk for osteopenia and osteoporosis Michelle Griffin is at higher risk of osteopenia and osteoporosis due to vitamin D deficiency.   Insulin Resistance Michelle Griffin has a diagnosis of insulin resistance based on her elevated fasting insulin level >5. Although Michelle Griffin's blood glucose readings are still under good control, insulin resistance puts her at greater risk of metabolic syndrome and diabetes. She is not taking metformin currently and continues to work on diet and exercise to decrease risk of diabetes. She denies polyphagia.  ALLERGIES: Allergies  Allergen Reactions  . Sulfa Antibiotics Hives    MEDICATIONS: Current Outpatient Medications on File Prior to Visit  Medication Sig Dispense Refill  . albuterol (PROVENTIL HFA;VENTOLIN HFA) 108 (90 BASE) MCG/ACT inhaler Inhale 2 puffs into the lungs every 6 (six) hours as needed for wheezing or shortness of breath. 1 Inhaler 0  . azelastine (ASTELIN) 0.1 % nasal spray Place 2 sprays into both nostrils 2 (two) times daily. Use in each nostril as directed 30 mL 0  . EPINEPHrine (EPI-PEN) 0.3 mg/0.3 mL SOAJ injection Inject 0.3 mg into the muscle once.    [name] esomeprazole (NEXIUM) 40 MG capsule Take  40 mg by mouth daily at 12 noon.    . famotidine (PEPCID) 40 MG tablet TAKE 1 TABLET (40 MG TOTAL) BY MOUTH DAILY. 90 tablet 0  . fluticasone (FLONASE) 50 MCG/ACT nasal spray Place into both nostrils daily.    [name] ibuprofen (ADVIL,MOTRIN) 200 MG tablet Take 600 mg by mouth every 4 (four) hours as needed.    . Lactobacillus (PROBIOTIC ACIDOPHILUS) CAPS Take 1 capsule by mouth daily.    [name] levocetirizine (XYZAL) 5 MG tablet Take 5 mg by mouth every evening.    . Melatonin 5 MG CAPS Take by mouth.    . methocarbamol (ROBAXIN) 500 MG tablet Take 1 tablet (500 mg total) by mouth 3 (three) times daily. (Patient taking differently: Take 500 mg by mouth every 6 (six) hours as needed. ) 20 tablet 0  . montelukast (SINGULAIR) 10 MG tablet Take 10 mg by mouth at bedtime.    . Multiple Vitamins-Minerals (MULTIVITAMIN WITH MINERALS) tablet Take 1 tablet by mouth daily.    . Naltrexone-buPROPion HCl ER (CONTRAVE) 8-90 MG TB12 TAKE 2 TABLETS BY MOUTH 2 (TWO) TIMES DAILY. 120 tablet 0  . nitroGLYCERIN (NITRODUR - DOSED IN MG/24 HR) 0.2 mg/hr patch PLACE 1 PATCH (0.2 MG TOTAL) ONTO THE SKIN DAILY. 30 patch 11  . predniSONE (DELTASONE) 10 MG tablet Take 10 mg by mouth daily as needed.     . sertraline (ZOLOFT) 100 MG tablet TAKE 1 TABLET BY MOUTH ONCE DAILY 90 tablet 1  . tolterodine (  DETROL) 2 MG tablet Take 2 mg by mouth at bedtime.      No current facility-administered medications on file prior to visit.     PAST MEDICAL HISTORY: Past Medical History:  Diagnosis Date  . Allergy    sees [name]  . Anemia    hx/o iron deficiency due to heavy periods  . Asthma   . Bursitis   . DDD (degenerative disc disease), lumbar   . Fatty liver   . GERD (gastroesophageal reflux disease)    [name]  . H/O mammogram   . Hiatal hernia    small per EGD, [name]  . Hyperlipidemia 2012   was on pravastatin prior  . Migraine    prior eval with neurologist, Dr. Melton Alar  . Overactive bladder   . PMDD  (premenstrual dysphoric disorder)   . Pyelonephritis    age 25yo  . Routine gynecological examination    Gunnison, [name], NP  . Uterine infection 07/2015   s/p novasure ablation  . Vitamin D deficiency   . Wears glasses     PAST SURGICAL HISTORY: Past Surgical History:  Procedure Laterality Date  . CHOLECYSTECTOMY    . COLONOSCOPY  2014   repeat 5 years; [name], baseline  . ESOPHAGOGASTRODUODENOSCOPY  2014   [name]  . FOOT SURGERY Left    morton's neuroma  . LASIK     [name]  . LUMBAR DISC SURGERY  07/2013   due to herniated disc, [name]  . NOVASURE ABLATION    . WISDOM TOOTH EXTRACTION      SOCIAL HISTORY: Social History   Tobacco Use  . Smoking status: Never Smoker  . Smokeless tobacco: Never Used  Substance Use Topics  . Alcohol use: Yes    Alcohol/week: 3.0 standard drinks    Types: 3 Cans of beer per week    Comment: socially  . Drug use: No    FAMILY HISTORY: Family History  Problem Relation Age of Onset  . Lupus Mother   . Hypertension Mother   . Hyperlipidemia Mother   . Migraines Mother   . Kidney disease Mother   . Hypertension Father   . Diabetes Father   . Hyperlipidemia Father   . Depression Father   . Anxiety disorder Father   . Obesity Sister   . Hypertension Sister   . Diabetes Sister   . Heart disease Paternal Uncle        CABG  . Hyperlipidemia Paternal Uncle   . Cancer Paternal Uncle        multiple with colon cancer  . Cancer Cousin        breast  . Cancer Cousin        ovarian  . Ovarian cysts Daughter   . Stroke Neg Hx     ROS: Review of Systems  Constitutional: Negative for weight loss.  Gastrointestinal: Negative for nausea and vomiting.  Musculoskeletal:       Negative muscle weakness  Endo/Heme/Allergies:       Negative polyphagia    PHYSICAL EXAM: Blood pressure 109/71, pulse 87, temperature 98.1 F (36.7 C), temperature source Oral, height 5\' 10"  (1.778 m), weight 214 lb  (97.1 kg), SpO2 97 %. Body mass index is 30.71 kg/m. Physical Exam  Constitutional: She is oriented to person, place, and time. She appears well-developed and well-nourished.  Cardiovascular: Normal rate.  Pulmonary/Chest: Effort normal.  Musculoskeletal: Normal range of motion.  Neurological: She is oriented to  person, place, and time.  Skin: Skin is warm and dry.  Psychiatric: She has a normal mood and affect. Her behavior is normal.  Vitals reviewed.   RECENT LABS AND TESTS: BMET    Component Value Date/Time   NA 138 03/02/2018 0958   K 4.6 03/02/2018 0958   CL 102 03/02/2018 0958   CO2 24 03/02/2018 0958   GLUCOSE 91 03/02/2018 0958   GLUCOSE 89 03/23/2017 0841   BUN 13 03/02/2018 0958   CREATININE 0.78 03/02/2018 0958   CREATININE 0.81 03/23/2017 0841   CALCIUM 9.5 03/02/2018 0958   GFRNONAA 92 03/02/2018 0958   GFRAA 106 03/02/2018 0958   Lab Results  Component Value Date   HGBA1C 5.7 (H) 03/02/2018   HGBA1C 5.5 11/03/2017   HGBA1C 5.5 03/23/2017   HGBA1C 5.4 02/17/2016   HGBA1C 5.6 02/13/2015   Lab Results  Component Value Date   INSULIN 14.0 03/02/2018   INSULIN 10.7 11/03/2017   CBC    Component Value Date/Time   WBC 5.1 03/02/2018 0958   WBC 5.2 03/23/2017 0841   RBC 5.27 03/02/2018 0958   RBC 4.68 03/23/2017 0841   HGB 13.3 03/02/2018 0958   HCT 41.4 03/02/2018 0958   PLT 259 03/23/2017 0841   MCV 79 03/02/2018 0958   MCH 25.2 (L) 03/02/2018 0958   MCH 27.8 03/23/2017 0841   MCHC 32.1 03/02/2018 0958   MCHC 33.9 03/23/2017 0841   RDW 17.0 (H) 03/02/2018 0958   LYMPHSABS 1.5 03/02/2018 0958   MONOABS 1.5 (H) 07/30/2015 0521   EOSABS 0.0 03/02/2018 0958   BASOSABS 0.1 03/02/2018 0958   Iron/TIBC/Ferritin/ %Sat    Component Value Date/Time   IRON 97 02/13/2015 0001   TIBC 339 02/13/2015 0001   IRONPCTSAT 29 02/13/2015 0001   Lipid Panel     Component Value Date/Time   CHOL 235 (H) 11/03/2017 1153   TRIG 82 11/03/2017 1153   HDL 85  11/03/2017 1153   CHOLHDL 2.7 03/23/2017 0841   VLDL 24 02/17/2016 0728   LDLCALC 134 (H) 11/03/2017 1153   LDLCALC 119 (H) 03/23/2017 0841   Hepatic Function Panel     Component Value Date/Time   PROT 7.3 03/02/2018 0958   ALBUMIN 4.1 03/02/2018 0958   AST 20 03/02/2018 0958   ALT 20 03/02/2018 0958   ALKPHOS 89 03/02/2018 0958   BILITOT 0.2 03/02/2018 0958   BILIDIR 0.1 08/21/2014 0853   IBILI 0.2 08/21/2014 0853      Component Value Date/Time   TSH 1.480 11/03/2017 1153   TSH 2.63 02/17/2016 0728   TSH 2.697 08/09/2013 1031   Results for Hanigan, GERTUDE BENITO (MRN [id number]) as of 04/25/2018 17:05  Ref. Range 03/02/2018 09:58  Vitamin D, 25-Hydroxy Latest Ref Range: 30.0 - 100.0 ng/mL 38.9   ASSESSMENT AND PLAN: Vitamin D deficiency - Plan: Vitamin D, Ergocalciferol, (DRISDOL) 1.25 MG (50000 UT) CAPS capsule  Insulin resistance  At risk for osteoporosis  Class 1 obesity with serious comorbidity and body mass index (BMI) of 30.0 to 30.9 in adult, unspecified obesity type  PLAN:  Vitamin D Deficiency Michelle Griffin was informed that low vitamin D levels contributes to fatigue and are associated with obesity, breast, and colon cancer. Michelle Griffin agrees to continue taking prescription Vit D @50 ,000 IU every week #4 and we will refill for 1 month. She will follow up for routine testing of vitamin D, at least 2-3 times per year. She was informed of the risk of over-replacement of vitamin  D and agrees to not increase her dose unless she discusses this with [name] first. Tinea agrees to follow up with our clinic in 3 weeks.  At risk for osteopenia and osteoporosis Michelle Griffin was given extended (15 minutes) osteoporosis prevention counseling today. Michelle Griffin is at risk for osteopenia and osteoporsis due to her vitamin D deficiency. She was encouraged to take her vitamin D and follow her higher calcium diet and increase strengthening exercise to help strengthen her bones and decrease her risk of osteopenia and  osteoporosis.  Insulin Resistance Michelle Griffin will continue to work on weight loss, diet, exercise, and decreasing simple carbohydrates in her diet to help decrease the risk of diabetes. We dicussed metformin including benefits and risks. She was informed that eating too many simple carbohydrates or too many calories at one sitting increases the likelihood of GI side effects. Michelle Griffin declined metformin for now and prescription was not written today. Michelle Griffin agrees to follow up with our clinic in 3 weeks as directed to monitor her progress.  Obesity Michelle Griffin is currently in the action stage of change. As such, her goal is to continue with weight loss efforts She has agreed to follow the Category 3 plan Michelle Griffin has been instructed to work up to a goal of 150 minutes of combined cardio and strengthening exercise per week for weight loss and overall health benefits. We discussed the following Behavioral Modification Strategies today: work on meal planning and easy cooking plans and holiday eating strategies  We discussed various medication options to help Michelle Griffin with her weight loss efforts and we both agreed to continue Contrave 8-90 mg 2 tablets BID #120 and we will refill for 1 month.  Geni has agreed to follow up with our clinic in 3 weeks. She was informed of the importance of frequent follow up visits to maximize her success with intensive lifestyle modifications for her multiple health conditions.   OBESITY BEHAVIORAL INTERVENTION VISIT  Today's visit was # 7   Starting weight: 211 lbs Starting date: 11/03/17 Today's weight : 214 lbs  Today's date: 04/21/2018 Total lbs lost to date: 0    ASK: We discussed the diagnosis of obesity with Azharia H Santy today and Klea agreed to give [name] permission to discuss obesity behavioral modification therapy today.  ASSESS: Caryl has the diagnosis of obesity and her BMI today is 30.71 Mylin is in the action stage of change   ADVISE: Deeandra was educated on the  multiple health risks of obesity as well as the benefit of weight loss to improve her health. She was advised of the need for long term treatment and the importance of lifestyle modifications.  AGREE: Multiple dietary modification options and treatment options were discussed and  Isabelle agreed to the above obesity treatment plan.  Wilhemena Durie, am acting as transcriptionist for Abby Potash, PA-C I, Abby Potash, PA-C have reviewed above note and agree with its content

## 2018-04-26 DIAGNOSIS — J301 Allergic rhinitis due to pollen: Secondary | ICD-10-CM | POA: Diagnosis not present

## 2018-04-26 DIAGNOSIS — J3089 Other allergic rhinitis: Secondary | ICD-10-CM | POA: Diagnosis not present

## 2018-04-26 DIAGNOSIS — J3081 Allergic rhinitis due to animal (cat) (dog) hair and dander: Secondary | ICD-10-CM | POA: Diagnosis not present

## 2018-04-26 MED ORDER — NALTREXONE-BUPROPION HCL ER 8-90 MG PO TB12: ORAL_TABLET | ORAL | 0 refills | Status: DC

## 2018-04-26 MED FILL — CONTRAVE ER 8-90 MG TABLET: 8-90 | 30 days supply | Qty: 120 | Fill #0

## 2018-05-02 MED FILL — LEVOCETIRIZINE 5 MG TABLET: 5 | 90 days supply | Qty: 90 | Fill #0

## 2018-05-02 MED FILL — MONTELUKAST SOD 10 MG TAB: 10 | 90 days supply | Qty: 90 | Fill #0

## 2018-05-06 ENCOUNTER — Telehealth: Payer: 59 | Admitting: Family

## 2018-05-06 DIAGNOSIS — B9789 Other viral agents as the cause of diseases classified elsewhere: Secondary | ICD-10-CM

## 2018-05-06 DIAGNOSIS — J029 Acute pharyngitis, unspecified: Secondary | ICD-10-CM

## 2018-05-06 DIAGNOSIS — J329 Chronic sinusitis, unspecified: Secondary | ICD-10-CM

## 2018-05-06 MED ORDER — BENZONATATE 100 MG PO CAPS: 100.0000 mg | ORAL_CAPSULE | Freq: Three times a day (TID) | ORAL | 0 refills | Status: DC | PRN

## 2018-05-06 MED ORDER — PREDNISONE 5 MG PO TABS: 5.0000 mg | ORAL_TABLET | ORAL | 0 refills | Status: DC

## 2018-05-06 NOTE — Progress Notes (Signed)
Thank you for the details you included in the comment boxes. Those details are very helpful in determining the best course of treatment for you and help [name] to provide the best care. See plan below.   Providers prescribe antibiotics to treat infections caused by bacteria. Antibiotics are very powerful in treating bacterial infections when they are used properly. To maintain their effectiveness, they should be used only when necessary. Overuse of antibiotics has resulted in the development of superbugs that are resistant to treatment!    After careful review of your answers, I would not recommend an antibiotic for your condition.  Antibiotics are not effective against viruses and therefore should not be used to treat them. Common examples of infections caused by viruses include colds and flu   We are sorry that you are not feeling well.  Here is how we plan to help!  Based on your presentation I believe you most likely have A cough due to a virus.  This is called viral bronchitis/sinusitis  and is best treated by rest, plenty of fluids and control of the cough.  You may use Ibuprofen or Tylenol as directed to help your symptoms.     In addition you may use A non-prescription cough medication called Mucinex DM: take 2 tablets every 12 hours. and A prescription cough medication called Tessalon Perles 100mg . You may take 1-2 capsules every 8 hours as needed for your cough.  Prednisone 5 mg daily for 6 days (see taper instructions below)  Directions for 6 day taper: Day 1: 2 tablets before breakfast, 1 after both lunch & dinner and 2 at bedtime Day 2: 1 tab before breakfast, 1 after both lunch & dinner and 2 at bedtime Day 3: 1 tab at each meal & 1 at bedtime Day 4: 1 tab at breakfast, 1 at lunch, 1 at bedtime Day 5: 1 tab at breakfast & 1 tab at bedtime Day 6: 1 tab at breakfast   From your responses in the eVisit questionnaire you describe inflammation in the upper respiratory tract which is  causing a significant cough.  This is commonly called Bronchitis and has four common causes:    Allergies  Viral Infections  Acid Reflux  Bacterial Infection Allergies, viruses and acid reflux are treated by controlling symptoms or eliminating the cause. An example might be a cough caused by taking certain blood pressure medications. You stop the cough by changing the medication. Another example might be a cough caused by acid reflux. Controlling the reflux helps control the cough.  USE OF BRONCHODILATOR ("RESCUE") INHALERS: There is a risk from using your bronchodilator too frequently.  The risk is that over-reliance on a medication which only relaxes the muscles surrounding the breathing tubes can reduce the effectiveness of medications prescribed to reduce swelling and congestion of the tubes themselves.  Although you feel brief relief from the bronchodilator inhaler, your asthma may actually be worsening with the tubes becoming more swollen and filled with mucus.  This can delay other crucial treatments, such as oral steroid medications. If you need to use a bronchodilator inhaler daily, several times per day, you should discuss this with your provider.  There are probably better treatments that could be used to keep your asthma under control.     HOME CARE . Only take medications as instructed by your medical team. . Complete the entire course of an antibiotic. . Drink plenty of fluids and get plenty of rest. . Avoid close contacts especially the very  young and the elderly . Cover your mouth if you cough or cough into your sleeve. . Always remember to wash your hands . A steam or ultrasonic humidifier can help congestion.   GET HELP RIGHT AWAY IF: . You develop worsening fever. . You become short of breath . You cough up blood. . Your symptoms persist after you have completed your treatment plan MAKE SURE YOU   Understand these instructions.  Will watch your condition.  Will  get help right away if you are not doing well or get worse.  Your e-visit answers were reviewed by a board certified advanced clinical practitioner to complete your personal care plan.  Depending on the condition, your plan could have included both over the counter or prescription medications. If there is a problem please reply  once you have received a response from your provider. Your safety is important to [name].  If you have drug allergies check your prescription carefully.    You can use MyChart to ask questions about today's visit, request a non-urgent call back, or ask for a work or school excuse for 24 hours related to this e-Visit. If it has been greater than 24 hours you will need to follow up with your provider, or enter a new e-Visit to address those concerns. You will get an e-mail in the next two days asking about your experience.  I hope that your e-visit has been valuable and will speed your recovery. Thank you for using e-visits.

## 2018-05-11 ENCOUNTER — Encounter (INDEPENDENT_AMBULATORY_CARE_PROVIDER_SITE_OTHER): Payer: Self-pay

## 2018-05-11 ENCOUNTER — Ambulatory Visit (INDEPENDENT_AMBULATORY_CARE_PROVIDER_SITE_OTHER): Payer: 59 | Admitting: Physician Assistant

## 2018-05-12 ENCOUNTER — Encounter: Payer: 59 | Admitting: Medical

## 2018-05-18 DIAGNOSIS — J3089 Other allergic rhinitis: Secondary | ICD-10-CM | POA: Diagnosis not present

## 2018-05-18 DIAGNOSIS — J3081 Allergic rhinitis due to animal (cat) (dog) hair and dander: Secondary | ICD-10-CM | POA: Diagnosis not present

## 2018-05-18 DIAGNOSIS — J301 Allergic rhinitis due to pollen: Secondary | ICD-10-CM | POA: Diagnosis not present

## 2018-05-20 DIAGNOSIS — M67911 Unspecified disorder of synovium and tendon, right shoulder: Secondary | ICD-10-CM | POA: Diagnosis not present

## 2018-05-24 ENCOUNTER — Other Ambulatory Visit: Payer: Self-pay | Admitting: Medical

## 2018-05-24 MED FILL — SERTRALINE HCL 100 MG TAB: 100 | 90 days supply | Qty: 90 | Fill #0

## 2018-05-24 MED FILL — VIT D2 1.25 MG (50,000 UNIT: 1.25 MG | 28 days supply | Qty: 4 | Fill #0

## 2018-05-24 NOTE — Telephone Encounter (Signed)
[facility] is requesting to fill pt Zoloft. Please advise. [place]

## 2018-05-27 DIAGNOSIS — J3081 Allergic rhinitis due to animal (cat) (dog) hair and dander: Secondary | ICD-10-CM | POA: Diagnosis not present

## 2018-05-27 DIAGNOSIS — J301 Allergic rhinitis due to pollen: Secondary | ICD-10-CM | POA: Diagnosis not present

## 2018-05-27 DIAGNOSIS — J3089 Other allergic rhinitis: Secondary | ICD-10-CM | POA: Diagnosis not present

## 2018-05-27 MED FILL — FLUTICASONE PROP 50 MCG SPR: 50 | 90 days supply | Qty: 48 | Fill #0

## 2018-06-02 ENCOUNTER — Ambulatory Visit (INDEPENDENT_AMBULATORY_CARE_PROVIDER_SITE_OTHER): Payer: 59 | Admitting: Physician Assistant

## 2018-06-02 ENCOUNTER — Encounter (INDEPENDENT_AMBULATORY_CARE_PROVIDER_SITE_OTHER): Payer: Self-pay

## 2018-06-08 DIAGNOSIS — J301 Allergic rhinitis due to pollen: Secondary | ICD-10-CM | POA: Diagnosis not present

## 2018-06-08 DIAGNOSIS — J3081 Allergic rhinitis due to animal (cat) (dog) hair and dander: Secondary | ICD-10-CM | POA: Diagnosis not present

## 2018-06-08 DIAGNOSIS — J3089 Other allergic rhinitis: Secondary | ICD-10-CM | POA: Diagnosis not present

## 2018-06-16 ENCOUNTER — Other Ambulatory Visit: Payer: Self-pay | Admitting: Obstetrics and Gynecology

## 2018-06-16 ENCOUNTER — Encounter: Payer: Self-pay | Admitting: Medical

## 2018-06-16 ENCOUNTER — Ambulatory Visit (INDEPENDENT_AMBULATORY_CARE_PROVIDER_SITE_OTHER): Payer: 59 | Admitting: Medical

## 2018-06-16 VITALS — BP 128/88 | HR 86 | Temp 98.3°F | Ht 70.0 in | Wt 226.2 lb

## 2018-06-16 DIAGNOSIS — J301 Allergic rhinitis due to pollen: Secondary | ICD-10-CM

## 2018-06-16 DIAGNOSIS — J4530 Mild persistent asthma, uncomplicated: Secondary | ICD-10-CM | POA: Diagnosis not present

## 2018-06-16 DIAGNOSIS — K219 Gastro-esophageal reflux disease without esophagitis: Secondary | ICD-10-CM | POA: Diagnosis not present

## 2018-06-16 DIAGNOSIS — M722 Plantar fascial fibromatosis: Secondary | ICD-10-CM

## 2018-06-16 DIAGNOSIS — J3089 Other allergic rhinitis: Secondary | ICD-10-CM | POA: Diagnosis not present

## 2018-06-16 DIAGNOSIS — J3081 Allergic rhinitis due to animal (cat) (dog) hair and dander: Secondary | ICD-10-CM | POA: Diagnosis not present

## 2018-06-16 DIAGNOSIS — R7301 Impaired fasting glucose: Secondary | ICD-10-CM | POA: Diagnosis not present

## 2018-06-16 DIAGNOSIS — Z23 Encounter for immunization: Secondary | ICD-10-CM

## 2018-06-16 DIAGNOSIS — E559 Vitamin D deficiency, unspecified: Secondary | ICD-10-CM

## 2018-06-16 DIAGNOSIS — K760 Fatty (change of) liver, not elsewhere classified: Secondary | ICD-10-CM | POA: Diagnosis not present

## 2018-06-16 DIAGNOSIS — Z0000 Encounter for general adult medical examination without abnormal findings: Secondary | ICD-10-CM

## 2018-06-16 DIAGNOSIS — E785 Hyperlipidemia, unspecified: Secondary | ICD-10-CM

## 2018-06-16 DIAGNOSIS — N3281 Overactive bladder: Secondary | ICD-10-CM

## 2018-06-16 DIAGNOSIS — E669 Obesity, unspecified: Secondary | ICD-10-CM

## 2018-06-16 DIAGNOSIS — Z1231 Encounter for screening mammogram for malignant neoplasm of breast: Secondary | ICD-10-CM

## 2018-06-16 DIAGNOSIS — F3281 Premenstrual dysphoric disorder: Secondary | ICD-10-CM | POA: Diagnosis not present

## 2018-06-16 DIAGNOSIS — M5137 Other intervertebral disc degeneration, lumbosacral region: Secondary | ICD-10-CM

## 2018-06-16 LAB — POCT URINALYSIS DIP (PROADVANTAGE DEVICE)
BILIRUBIN UA: NEGATIVE
Blood, UA: NEGATIVE
Glucose, UA: NEGATIVE mg/dL
Ketones, POC UA: NEGATIVE mg/dL
Leukocytes, UA: NEGATIVE
Nitrite, UA: NEGATIVE
Protein Ur, POC: NEGATIVE mg/dL
Specific Gravity, Urine: 1.02
Urobilinogen, Ur: NEGATIVE
pH, UA: 6 (ref 5.0–8.0)

## 2018-06-16 NOTE — Progress Notes (Signed)
Subjective:   HPI  Michelle Griffin is a 48 y.o. female who presents for Chief Complaint  Patient presents with  . Annual Exam    Medical care team includes:  [facility]  [name], Allergist  Dr. Jolayne Panther, [facility] for eye doctor  [name] - Back specialist/Orthopedist  Dr. Sharol Given, orthopedics  [name], GI  Dorothea Ogle, PA-C here for primary care   Concerns: Allergies and asthma - gets weekly allergy shots  No particular c/o  Seeing weight management clinic. Had a bit of a slump in December with holidays and eating unhealthy.   Not exercising in recent weeks  She and husband have committed to plant based diet though.  Reviewed their medical, surgical, family, social, medication, and allergy history and updated chart as appropriate.  Past Medical History:  Diagnosis Date  . Allergy    sees [name]  . Anemia    hx/o iron deficiency due to heavy periods  . Asthma   . Bursitis   . DDD (degenerative disc disease), lumbar   . Fatty liver   . GERD (gastroesophageal reflux disease)    [name]  . H/O mammogram   . Hiatal hernia    small per EGD, [name]  . Hyperlipidemia 2012   was on pravastatin prior  . Migraine    prior eval with neurologist, Dr. Melton Alar  . Overactive bladder   . PMDD (premenstrual dysphoric disorder)   . Pyelonephritis    age 27yo  . Routine gynecological examination    [place], [name], NP  . Uterine infection 07/2015   s/p novasure ablation  . Vitamin D deficiency   . Wears glasses     Past Surgical History:  Procedure Laterality Date  . CHOLECYSTECTOMY    . COLONOSCOPY  2014   repeat 5 years; [name], baseline  . ESOPHAGOGASTRODUODENOSCOPY  2014   [name]  . FOOT SURGERY Left    morton's neuroma  . LASIK     [name]  . LUMBAR DISC SURGERY  07/2013   due to herniated disc, [name]  . NOVASURE ABLATION    . WISDOM TOOTH EXTRACTION       Social History   Socioeconomic History  . Marital status: Married    Spouse name: Jenny Reichmann  . Number of children: 2  . Years of education: Not on file  . Highest education level: Not on file  Occupational History  . Not on file  Social Needs  . Financial resource strain: Not on file  . Food insecurity:    Worry: Not on file    Inability: Not on file  . Transportation needs:    Medical: Not on file    Non-medical: Not on file  Tobacco Use  . Smoking status: Never Smoker  . Smokeless tobacco: Never Used  Substance and Sexual Activity  . Alcohol use: Yes    Alcohol/week: 3.0 standard drinks    Types: 3 Cans of beer per week    Comment: socially  . Drug use: No  . Sexual activity: Not on file    Comment: partner had vasectomy  Lifestyle  . Physical activity:    Days per week: Not on file    Minutes per session: Not on file  . Stress: Not on file  Relationships  . Social connections:    Talks on phone: Not on file    Gets together: Not on file    Attends religious service:  Not on file    Active member of club or organization: Not on file    Attends meetings of clubs or organizations: Not on file    Relationship status: Not on file  . Intimate partner violence:    Fear of current or ex partner: Not on file    Emotionally abused: Not on file    Physically abused: Not on file    Forced sexual activity: Not on file  Other Topics Concern  . Not on file  Social History Narrative   Married, 2 children 28yo and 24yo, dog, Christian,was involved in a motorcycle club; exercise - walking.  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 Lupus Mother   . Hypertension Mother   . Hyperlipidemia Mother   . Migraines Mother   . Kidney disease Mother   . Hypertension Father   . Diabetes Father   . Hyperlipidemia Father   . Depression Father   . Anxiety disorder Father   . Dementia Father    . Obesity Sister   . Hypertension Sister   . Diabetes Sister   . Heart disease Paternal Uncle        CABG  . Hyperlipidemia Paternal Uncle   . Cancer Paternal Uncle        multiple with colon cancer  . Cancer Cousin        breast  . Cancer Cousin        ovarian  . Ovarian cysts Daughter   . Stroke Neg Hx      Current Outpatient Medications:  .  albuterol (PROVENTIL HFA;VENTOLIN HFA) 108 (90 BASE) MCG/ACT inhaler, Inhale 2 puffs into the lungs every 6 (six) hours as needed for wheezing or shortness of breath., Disp: 1 Inhaler, Rfl: 0 .  azelastine (ASTELIN) 0.1 % nasal spray, Place 2 sprays into both nostrils 2 (two) times daily. Use in each nostril as directed, Disp: 30 mL, Rfl: 0 .  benzonatate (TESSALON PERLES) 100 MG capsule, Take 1-2 capsules (100-200 mg total) by mouth every 8 (eight) hours as needed for cough., Disp: 30 capsule, Rfl: 0 .  EPINEPHrine (EPI-PEN) 0.3 mg/0.3 mL SOAJ injection, Inject 0.3 mg into the muscle once., Disp: , Rfl:  .  esomeprazole (NEXIUM) 40 MG capsule, Take 40 mg by mouth daily at 12 noon., Disp: , Rfl:  .  famotidine (PEPCID) 40 MG tablet, TAKE 1 TABLET (40 MG TOTAL) BY MOUTH DAILY., Disp: 90 tablet, Rfl: 0 .  fluticasone (FLONASE) 50 MCG/ACT nasal spray, Place into both nostrils daily., Disp: , Rfl:  .  ibuprofen (ADVIL,MOTRIN) 200 MG tablet, Take 600 mg by mouth every 4 (four) hours as needed., Disp: , Rfl:  .  Lactobacillus (PROBIOTIC ACIDOPHILUS) CAPS, Take 1 capsule by mouth daily., Disp: , Rfl:  .  levocetirizine (XYZAL) 5 MG tablet, Take 5 mg by mouth every evening., Disp: , Rfl:  .  Melatonin 5 MG CAPS, Take by mouth., Disp: , Rfl:  .  methocarbamol (ROBAXIN) 500 MG tablet, Take 1 tablet (500 mg total) by mouth 3 (three) times daily. (Patient taking differently: Take 500 mg by mouth every 6 (six) hours as needed. ), Disp: 20 tablet, Rfl: 0 .  montelukast (SINGULAIR) 10 MG tablet, Take 10 mg by mouth at bedtime., Disp: , Rfl:  .  Multiple  Vitamins-Minerals (MULTIVITAMIN WITH MINERALS) tablet, Take 1 tablet by mouth daily., Disp: ,  Rfl:  .  Naltrexone-buPROPion HCl ER (CONTRAVE) 8-90 MG TB12, TAKE 2 TABLETS BY MOUTH 2 (TWO) TIMES DAILY., Disp: 120 tablet, Rfl: 0 .  nitroGLYCERIN (NITRODUR - DOSED IN MG/24 HR) 0.2 mg/hr patch, PLACE 1 PATCH (0.2 MG TOTAL) ONTO THE SKIN DAILY., Disp: 30 patch, Rfl: 11 .  sertraline (ZOLOFT) 100 MG tablet, TAKE 1 TABLET BY MOUTH ONCE DAILY, Disp: 90 tablet, Rfl: 0 .  Vitamin D, Ergocalciferol, (DRISDOL) 1.25 MG (50000 UT) CAPS capsule, Take 1 capsule (50,000 Units total) by mouth every 7 (seven) days., Disp: 4 capsule, Rfl: 0  Allergies  Allergen Reactions  . Sulfa Antibiotics Hives      Review of Systems Constitutional: -fever, -chills, -sweats, -unexpected weight change, -decreased appetite, -fatigue Allergy: -sneezing, -itching, -congestion Dermatology: -changing moles, --rash, -lumps ENT: -runny nose, -ear pain, -sore throat, -hoarseness, -sinus pain, -teeth pain, - ringing in ears, -hearing loss, -nosebleeds Cardiology: -chest pain, -palpitations, -swelling, -difficulty breathing when lying flat, -waking up short of breath Respiratory: -cough, -shortness of breath, -difficulty breathing with exercise or exertion, -wheezing, -coughing up blood Gastroenterology: -abdominal pain, -nausea, -vomiting, -diarrhea, -constipation, -blood in stool, -changes in bowel movement, -difficulty swallowing or eating Hematology: -bleeding, -bruising  Musculoskeletal: -joint aches, -muscle aches, -joint swelling, -back pain, -neck pain, -cramping, -changes in gait Ophthalmology: denies vision changes, eye redness, itching, discharge Urology: -burning with urination, -difficulty urinating, -blood in urine, -urinary frequency, -urgency, -incontinence Neurology: -headache, -weakness, -tingling, -numbness, -memory loss, -falls, -dizziness Psychology: -depressed mood, -agitation, -sleep problems Breast/gyn:  -breast tenders, -discharge, -lumps, -vaginal discharge,- irregular periods, -heavy periods     Objective:  BP 128/88   Pulse 86   Temp 98.3 F (36.8 C) (Oral)   Ht 5\' 10"  (1.778 m)   Wt 226 lb 3.2 oz (102.6 kg)   SpO2 97%   BMI 32.46 kg/m   General appearance: alert, no distress, WD/WN, Caucasian female Skin: scattered macules, no worrisome lesions HEENT: normocephalic, conjunctiva/corneas normal, sclerae anicteric, PERRLA, EOMi, nares patent, no discharge or erythema, pharynx normal Oral cavity: MMM, tongue normal, teeth normal Neck: supple, no lymphadenopathy, no thyromegaly, no masses, normal ROM, no bruits Chest: non tender, normal shape and expansion Heart: RRR, normal S1, S2, no murmurs Lungs: CTA bilaterally, no wheezes, rhonchi, or rales Abdomen: +bs, soft, non tender, non distended, no masses, no hepatomegaly, no splenomegaly, no bruits Back: lumbar surgical scar, non tender, normal ROM, no scoliosis Musculoskeletal: upper extremities non tender, no obvious deformity, normal ROM throughout, lower extremities non tender, no obvious deformity, normal ROM throughout Extremities: no edema, no cyanosis, no clubbing Pulses: 2+ symmetric, upper and lower extremities, normal cap refill Neurological: alert, oriented x 3, CN2-12 intact, strength normal upper extremities and lower extremities, sensation normal throughout, DTRs 2+ throughout, no cerebellar signs, gait normal Psychiatric: normal affect, behavior normal, pleasant  Breast/gyn/rectal - deferred to gynecology     Assessment and Plan :   Encounter Diagnoses  Name Primary?  . Routine general medical examination at a health care facility Yes  . Mild persistent asthma without complication   . Allergic rhinitis due to pollen, unspecified seasonality   . Fatty liver   . Gastroesophageal reflux disease without esophagitis   . Impaired fasting blood sugar   . Hyperlipidemia, unspecified hyperlipidemia type   . Vitamin D  deficiency   . PMDD (premenstrual dysphoric disorder)   . Obesity with serious comorbidity, unspecified classification, unspecified obesity type   . OAB (overactive bladder)   . DEGENERATIVE DISC DISEASE, LUMBAR SPINE   .  Plantar fasciitis, left   . Need for Td vaccine     Physical exam - discussed and counseled on healthy lifestyle, diet, exercise, preventative care, vaccinations, sick and well care, proper use of emergency dept and after hours care, and addressed their concerns.    Health screening: Advised they see their eye doctor yearly for routine vision care. Advised they see their dentist yearly for routine dental care including hygiene visits twice yearly.  Cancer screening Counseled on self breast exams, mammograms, cervical cancer screening  Vaccinations: Advised yearly influenza vaccine She gets yearly flu shot at work We will request vaccine records from [place] on the Td (tetanus, diptheria) vaccine.  Vaccine information sheet given. Td vaccine given after consent obtained.  Acute issues discussed: none  Separate significant chronic issues discussed: I reviewed her labs from October and last lipids in past 12 months  Counseled on diet and exercise, weight loss efforts, and she continues with weight management clinic currently  Asthma and allergies - managed by allergist, weekly allergy shots  I hope she has fun on her cruise next week to [place].   Advised Hep A vaccination.  We will check vaccines from employee health as she thinks she may have had full hepatitis vaccination  Vitamin D deficiency advise continuing supplement  PMDD, OAB-managed by gynecology  Impaired glucose-reviewed labs from past few months  Hyperlipidemia-according to current cardiac risk calculator she is not recommended to be on statin although we did talk about healthy diet  Degenerative disc disease-continue routine exercise  Fatty liver disease-counseled on  need for healthy diet and weight loss, advise she consider talking her gastroenterologist about a liver scan/fibroscan  Flonnie was seen today for annual exam.  Diagnoses and all orders for this visit:  Routine general medical examination at a health care facility -     POCT Urinalysis DIP (Proadvantage Device)  Mild persistent asthma without complication  Allergic rhinitis due to pollen, unspecified seasonality  Fatty liver  Gastroesophageal reflux disease without esophagitis  Impaired fasting blood sugar  Hyperlipidemia, unspecified hyperlipidemia type  Vitamin D deficiency  PMDD (premenstrual dysphoric disorder)  Obesity with serious comorbidity, unspecified classification, unspecified obesity type  OAB (overactive bladder)  DEGENERATIVE DISC DISEASE, LUMBAR SPINE  Plantar fasciitis, left  Need for Td vaccine   Follow-up pending labs, yearly for physical

## 2018-06-16 NOTE — Patient Instructions (Signed)
Thanks for trusting [name] with your health care and for coming in for a physical today.  Below are some general recommendations I have for you:  Yearly screenings See your eye doctor yearly for routine vision care. See your dentist yearly for routine dental care including hygiene visits twice yearly. See me here yearly for a routine physical and preventative care visit   Please follow up yearly for a physical.    I have included other useful information below for your review.  Preventative Care for Adults - Female      MAINTAIN REGULAR HEALTH EXAMS:  A routine yearly physical is a good way to check in with your primary care provider about your health and preventive screening. It is also an opportunity to share updates about your health and any concerns you have, and receive a thorough all-over exam.   Most health insurance companies pay for at least some preventative services.  Check with your health plan for specific coverages.  WHAT PREVENTATIVE SERVICES DO WOMEN NEED?  Adult women should have their weight and blood pressure checked regularly.   Women age 13 and older should have their cholesterol levels checked regularly.  Women should be screened for cervical cancer with a Pap smear and pelvic exam beginning at either age 33, or 3 years after they become sexually activity.    Breast cancer screening generally begins at age 21 with a mammogram and breast exam by your primary care provider.    Beginning at age 68 and continuing to age 23, women should be screened for colorectal cancer.  Certain people may need continued testing until age 30.  Updating vaccinations is part of preventative care.  Vaccinations help protect against diseases such as the flu.  Osteoporosis is a disease in which the bones lose minerals and strength as we age. Women ages 74 and over should discuss this with their caregivers, as should women after menopause who have other risk factors.  Lab tests are  generally done as part of preventative care to screen for anemia and blood disorders, to screen for problems with the kidneys and liver, to screen for bladder problems, to check blood sugar, and to check your cholesterol level.  Preventative services generally include counseling about diet, exercise, avoiding tobacco, drugs, excessive alcohol consumption, and sexually transmitted infections.    GENERAL RECOMMENDATIONS FOR GOOD HEALTH:  Healthy diet:  Eat a variety of foods, including fruit, vegetables, animal or vegetable protein, such as meat, fish, chicken, and eggs, or beans, lentils, tofu, and grains, such as rice.  Drink plenty of water daily.  Decrease saturated fat in the diet, avoid lots of red meat, processed foods, sweets, fast foods, and fried foods.  Exercise:  Aerobic exercise helps maintain good heart health. At least 30-40 minutes of moderate-intensity exercise is recommended. For example, a brisk walk that increases your heart rate and breathing. This should be done on most days of the week.   Find a type of exercise or a variety of exercises that you enjoy so that it becomes a part of your daily life.  Examples are running, walking, swimming, water aerobics, and biking.  For motivation and support, explore group exercise such as aerobic class, spin class, Zumba, Yoga,or  martial arts, etc.    Set exercise goals for yourself, such as a certain weight goal, walk or run in a race such as a 5k walk/run.  Speak to your primary care provider about exercise goals.  Disease prevention:  If you  smoke or chew tobacco, find out from your caregiver how to quit. It can literally save your life, no matter how long you have been a tobacco user. If you do not use tobacco, never begin.   Maintain a healthy diet and normal weight. Increased weight leads to problems with blood pressure and diabetes.   The Body Mass Index or BMI is a way of measuring how much of your body is fat. Having a  BMI above 27 increases the risk of heart disease, diabetes, hypertension, stroke and other problems related to obesity. Your caregiver can help determine your BMI and based on it develop an exercise and dietary program to help you achieve or maintain this important measurement at a healthful level.  High blood pressure causes heart and blood vessel problems.  Persistent high blood pressure should be treated with medicine if weight loss and exercise do not work.   Fat and cholesterol leaves deposits in your arteries that can block them. This causes heart disease and vessel disease elsewhere in your body.  If your cholesterol is found to be high, or if you have heart disease or certain other medical conditions, then you may need to have your cholesterol monitored frequently and be treated with medication.   Ask if you should have a cardiac stress test if your history suggests this. A stress test is a test done on a treadmill that looks for heart disease. This test can find disease prior to there being a problem.  Menopause can be associated with physical symptoms and risks. Hormone replacement therapy is available to decrease these. You should talk to your caregiver about whether starting or continuing to take hormones is right for you.   Osteoporosis is a disease in which the bones lose minerals and strength as we age. This can result in serious bone fractures. Risk of osteoporosis can be identified using a bone density scan. Women ages 7 and over should discuss this with their caregivers, as should women after menopause who have other risk factors. Ask your caregiver whether you should be taking a calcium supplement and Vitamin D, to reduce the rate of osteoporosis.   Avoid drinking alcohol in excess (more than two drinks per day).  Avoid use of street drugs. Do not share needles with anyone. Ask for professional help if you need assistance or instructions on stopping the use of alcohol, cigarettes,  and/or drugs.  Brush your teeth twice a day with fluoride toothpaste, and floss once a day. Good oral hygiene prevents tooth decay and gum disease. The problems can be painful, unattractive, and can cause other health problems. Visit your dentist for a routine oral and dental check up and preventive care every 6-12 months.   Look at your skin regularly.  Use a mirror to look at your back. Notify your caregivers of changes in moles, especially if there are changes in shapes, colors, a size larger than a pencil eraser, an irregular border, or development of new moles.  Safety:  Use seatbelts 100% of the time, whether driving or as a passenger.  Use safety devices such as hearing protection if you work in environments with loud noise or significant background noise.  Use safety glasses when doing any work that could send debris in to the eyes.  Use a helmet if you ride a bike or motorcycle.  Use appropriate safety gear for contact sports.  Talk to your caregiver about gun safety.  Use sunscreen with a SPF (or skin protection factor)  of 15 or greater.  Lighter skinned people are at a greater risk of skin cancer. Don't forget to also wear sunglasses in order to protect your eyes from too much damaging sunlight. Damaging sunlight can accelerate cataract formation.   Practice safe sex. Use condoms. Condoms are used for birth control and to help reduce the spread of sexually transmitted infections (or STIs).  Some of the STIs are gonorrhea (the clap), chlamydia, syphilis, trichomonas, herpes, HPV (human papilloma virus) and HIV (human immunodeficiency virus) which causes AIDS. The herpes, HIV and HPV are viral illnesses that have no cure. These can result in disability, cancer and death.   Keep carbon monoxide and smoke detectors in your home functioning at all times. Change the batteries every 6 months or use a model that plugs into the wall.   Vaccinations:  Stay up to date with your tetanus shots and  other required immunizations. You should have a booster for tetanus every 10 years. Be sure to get your flu shot every year, since 5%-20% of the [place] population comes down with the flu. The flu vaccine changes each year, so being vaccinated once is not enough. Get your shot in the fall, before the flu season peaks.   Other vaccines to consider:  Human Papilloma Virus or HPV causes cancer of the cervix, and other infections that can be transmitted from person to person. There is a vaccine for HPV, and females should get immunized between the ages of 36 and 41. It requires a series of 3 shots.   Pneumococcal vaccine to protect against certain types of pneumonia.  This is normally recommended for adults age 41 or older.  However, adults younger than 46 years old with certain underlying conditions such as diabetes, heart or lung disease should also receive the vaccine.  Shingles vaccine to protect against Varicella Zoster if you are older than age 68, or younger than 46 years old with certain underlying illness.  If you have not had the Shingrix vaccine, please call your insurer to inquire about coverage for the Shingrix vaccine given in 2 doses.   Some insurers cover this vaccine after age 14, some cover this after age 90.  If your insurer covers this, then call to schedule appointment to have this vaccine here  Hepatitis A vaccine to protect against a form of infection of the liver by a virus acquired from food.  Hepatitis B vaccine to protect against a form of infection of the liver by a virus acquired from blood or body fluids, particularly if you work in health care.  If you plan to travel internationally, check with your local health department for specific vaccination recommendations.  Cancer Screening:  Breast cancer screening is essential to preventive care for women. All women age 31 and older should perform a breast self-exam every month. At age 81 and older, women should have their caregiver  complete a breast exam each year. Women at ages 44 and older should have a mammogram (x-ray film) of the breasts. Your caregiver can discuss how often you need mammograms.    Cervical cancer screening includes taking a Pap smear (sample of cells examined under a microscope) from the cervix (end of the uterus). It also includes testing for HPV (Human Papilloma Virus, which can cause cervical cancer). Screening and a pelvic exam should begin at age 5, or 3 years after a woman becomes sexually active. Screening should occur every year, with a Pap smear but no HPV testing, up to age  56. After age 44, you should have a Pap smear every 3 years with HPV testing, if no HPV was found previously.   Most routine colon cancer screening begins at the age of 61. On a yearly basis, doctors may provide special easy to use take-home tests to check for hidden blood in the stool. Sigmoidoscopy or colonoscopy can detect the earliest forms of colon cancer and is life saving. These tests use a small camera at the end of a tube to directly examine the colon. Speak to your caregiver about this at age 41, when routine screening begins (and is repeated every 5 years unless early forms of pre-cancerous polyps or small growths are found).

## 2018-06-17 ENCOUNTER — Other Ambulatory Visit: Payer: Self-pay | Admitting: Obstetrics and Gynecology

## 2018-06-17 DIAGNOSIS — Z1231 Encounter for screening mammogram for malignant neoplasm of breast: Secondary | ICD-10-CM

## 2018-06-20 ENCOUNTER — Telehealth: Payer: Self-pay | Admitting: Medical

## 2018-06-20 NOTE — Telephone Encounter (Signed)
Received requested immunizations from [facility]. Sending back for review.

## 2018-06-21 ENCOUNTER — Encounter (INDEPENDENT_AMBULATORY_CARE_PROVIDER_SITE_OTHER): Payer: Self-pay | Admitting: Physician Assistant

## 2018-06-21 ENCOUNTER — Ambulatory Visit (INDEPENDENT_AMBULATORY_CARE_PROVIDER_SITE_OTHER): Payer: 59 | Admitting: Physician Assistant

## 2018-06-21 VITALS — BP 112/56 | HR 72 | Temp 97.9°F | Ht 70.0 in | Wt 222.0 lb

## 2018-06-21 DIAGNOSIS — E559 Vitamin D deficiency, unspecified: Secondary | ICD-10-CM

## 2018-06-21 DIAGNOSIS — E8881 Metabolic syndrome: Secondary | ICD-10-CM

## 2018-06-21 DIAGNOSIS — E66811 Obesity, class 1: Secondary | ICD-10-CM

## 2018-06-21 DIAGNOSIS — E88819 Insulin resistance, unspecified: Secondary | ICD-10-CM

## 2018-06-21 DIAGNOSIS — Z9189 Other specified personal risk factors, not elsewhere classified: Secondary | ICD-10-CM

## 2018-06-21 DIAGNOSIS — E669 Obesity, unspecified: Secondary | ICD-10-CM | POA: Diagnosis not present

## 2018-06-21 DIAGNOSIS — Z6831 Body mass index (BMI) 31.0-31.9, adult: Secondary | ICD-10-CM | POA: Diagnosis not present

## 2018-06-21 MED ORDER — VITAMIN D (ERGOCALCIFEROL) 1.25 MG (50000 UNIT) PO CAPS: 50000.0000 [IU] | ORAL_CAPSULE | ORAL | 0 refills | Status: DC

## 2018-06-21 NOTE — Progress Notes (Signed)
Office: [phone]  /  Fax: [phone]   HPI:   Chief Complaint: OBESITY Marialy is here to discuss her progress with her obesity treatment plan. She is on the Category 3 plan and is following her eating plan approximately 50 % of the time. She states she is exercising 0 minutes 0 times per week. Chandria reports that she struggled with weight loss over the holiday. Her husband has gone to eating a plant-based diet and she wants to do the same. She is going on a cruise next week. Her weight is 222 lb (100.7 kg) today and she has lost 0 lbs since her last visit. She has lost 0 lbs since starting treatment with [name].  Vitamin D deficiency Janilah has a diagnosis of vitamin D deficiency. She is currently taking prescription Vit D and denies nausea, vomiting or muscle weakness.  Insulin Resistance Kannon has a diagnosis of insulin resistance based on her elevated fasting insulin level >5. Although Makyia's blood glucose readings are still under good control, insulin resistance puts her at greater risk of metabolic syndrome and diabetes. She is not on medication currently.  At risk for osteopenia and osteoporosis Rupal is at higher risk of osteopenia and osteoporosis due to vitamin D deficiency.    ASSESSMENT AND PLAN:  Vitamin D deficiency - Plan: Vitamin D, Ergocalciferol, (DRISDOL) 1.25 MG (50000 UT) CAPS capsule  Insulin resistance  At risk for osteoporosis  Class 1 obesity with serious comorbidity and body mass index (BMI) of 31.0 to 31.9 in adult, unspecified obesity type  PLAN:  Vitamin D Deficiency Mirage was informed that low vitamin D levels contributes to fatigue and are associated with obesity, breast, and colon cancer. She agrees to continue to take prescription Vit D @50 ,000 IU every week #4 with no refills and will follow up for routine testing of vitamin D, at least 2-3 times per year. She was informed of the risk of over-replacement of vitamin D and agrees to not increase her  dose unless she discusses this with [name] first. Tarisa agrees to follow up with our clinic in 2 weeks.  Insulin Resistance Destin will continue to work on weight loss, exercise, and decreasing simple carbohydrates in her diet to help decrease the risk of diabetes. She was informed that eating too many simple carbohydrates or too many calories at one sitting increases the likelihood of GI side effects. Lakeya is not on any medication. Sadye agrees to follow up with our clinic in 2 weeks.  At risk for osteopenia and osteoporosis Crisol was given extended  (15 minutes) osteoporosis prevention counseling today. Maven is at risk for osteopenia and osteoporosis due to her vitamin D deficiency. She was encouraged to take her vitamin D and follow her higher calcium diet and increase strengthening exercise to help strengthen her bones and decrease her risk of osteopenia and osteoporosis.  Obesity Alaysiah is currently in the action stage of change. As such, her goal is to continue with weight loss efforts She has agreed to change to keeping a food journal with 1500 calories and 95 grams of  protein daily Alsie has been instructed to work up to a goal of 150 minutes of combined cardio and strengthening exercise per week for weight loss and overall health benefits. We discussed the following Behavioral Modification Strategies today: work on meal planning and easy cooking plans and celebration eating strategies   Mikayela has agreed to follow up with our clinic in 2 weeks. She was informed of the importance  of frequent follow up visits to maximize her success with intensive lifestyle modifications for her multiple health conditions.  ALLERGIES: Allergies  Allergen Reactions  . Sulfa Antibiotics Hives    MEDICATIONS: Current Outpatient Medications on File Prior to Visit  Medication Sig Dispense Refill  . albuterol (PROVENTIL HFA;VENTOLIN HFA) 108 (90 BASE) MCG/ACT inhaler Inhale 2 puffs into the lungs every 6  (six) hours as needed for wheezing or shortness of breath. 1 Inhaler 0  . azelastine (ASTELIN) 0.1 % nasal spray Place 2 sprays into both nostrils 2 (two) times daily. Use in each nostril as directed 30 mL 0  . benzonatate (TESSALON PERLES) 100 MG capsule Take 1-2 capsules (100-200 mg total) by mouth every 8 (eight) hours as needed for cough. 30 capsule 0  . EPINEPHrine (EPI-PEN) 0.3 mg/0.3 mL SOAJ injection Inject 0.3 mg into the muscle once.    [name] esomeprazole (NEXIUM) 40 MG capsule Take 40 mg by mouth daily at 12 noon.    . famotidine (PEPCID) 40 MG tablet TAKE 1 TABLET (40 MG TOTAL) BY MOUTH DAILY. 90 tablet 0  . fluticasone (FLONASE) 50 MCG/ACT nasal spray Place into both nostrils daily.    [name] ibuprofen (ADVIL,MOTRIN) 200 MG tablet Take 600 mg by mouth every 4 (four) hours as needed.    . Lactobacillus (PROBIOTIC ACIDOPHILUS) CAPS Take 1 capsule by mouth daily.    [name] levocetirizine (XYZAL) 5 MG tablet Take 5 mg by mouth every evening.    . Melatonin 5 MG CAPS Take by mouth.    . methocarbamol (ROBAXIN) 500 MG tablet Take 1 tablet (500 mg total) by mouth 3 (three) times daily. (Patient taking differently: Take 500 mg by mouth every 6 (six) hours as needed. ) 20 tablet 0  . montelukast (SINGULAIR) 10 MG tablet Take 10 mg by mouth at bedtime.    . Multiple Vitamins-Minerals (MULTIVITAMIN WITH MINERALS) tablet Take 1 tablet by mouth daily.    . Naltrexone-buPROPion HCl ER (CONTRAVE) 8-90 MG TB12 TAKE 2 TABLETS BY MOUTH 2 (TWO) TIMES DAILY. 120 tablet 0  . nitroGLYCERIN (NITRODUR - DOSED IN MG/24 HR) 0.2 mg/hr patch PLACE 1 PATCH (0.2 MG TOTAL) ONTO THE SKIN DAILY. 30 patch 11  . sertraline (ZOLOFT) 100 MG tablet TAKE 1 TABLET BY MOUTH ONCE DAILY 90 tablet 0   No current facility-administered medications on file prior to visit.     PAST MEDICAL HISTORY: Past Medical History:  Diagnosis Date  . Allergy    sees [name]  . Anemia    hx/o iron deficiency due to heavy periods  . Asthma   .  Bursitis   . DDD (degenerative disc disease), lumbar   . Fatty liver   . GERD (gastroesophageal reflux disease)    [name]  . H/O mammogram   . Hiatal hernia    small per EGD, [name]  . Hyperlipidemia 2012   was on pravastatin prior  . Migraine    prior eval with neurologist, Dr. Melton Alar  . Overactive bladder   . PMDD (premenstrual dysphoric disorder)   . Pyelonephritis    age 89yo  . Routine gynecological examination    Ruskin, [name], NP  . Uterine infection 07/2015   s/p novasure ablation  . Vitamin D deficiency   . Wears glasses     PAST SURGICAL HISTORY: Past Surgical History:  Procedure Laterality Date  . CHOLECYSTECTOMY    . COLONOSCOPY  2014   repeat 5 years; [name], baseline  .  ESOPHAGOGASTRODUODENOSCOPY  2014   [name]  . FOOT SURGERY Left    morton's neuroma  . LASIK     [name]  . LUMBAR DISC SURGERY  07/2013   due to herniated disc, [name]  . NOVASURE ABLATION    . WISDOM TOOTH EXTRACTION      SOCIAL HISTORY: Social History   Tobacco Use  . Smoking status: Never Smoker  . Smokeless tobacco: Never Used  Substance Use Topics  . Alcohol use: Yes    Alcohol/week: 3.0 standard drinks    Types: 3 Cans of beer per week    Comment: socially  . Drug use: No    FAMILY HISTORY: Family History  Problem Relation Age of Onset  . Lupus Mother   . Hypertension Mother   . Hyperlipidemia Mother   . Migraines Mother   . Kidney disease Mother   . Hypertension Father   . Diabetes Father   . Hyperlipidemia Father   . Depression Father   . Anxiety disorder Father   . Dementia Father   . Obesity Sister   . Hypertension Sister   . Diabetes Sister   . Heart disease Paternal Uncle        CABG  . Hyperlipidemia Paternal Uncle   . Cancer Paternal Uncle        multiple with colon cancer  . Cancer Cousin        breast  . Cancer Cousin        ovarian  . Ovarian cysts Daughter   . Stroke Neg Hx     ROS: Review of  Systems  Constitutional: Negative for weight loss.  Gastrointestinal: Negative for nausea and vomiting.  Genitourinary: Negative for frequency.  Musculoskeletal:       Negative for muscle weakness  Endo/Heme/Allergies: Negative for polydipsia.    PHYSICAL EXAM: Blood pressure (!) 112/56, pulse 72, temperature 97.9 F (36.6 C), temperature source Oral, height 5\' 10"  (1.778 m), weight 222 lb (100.7 kg), SpO2 99 %. Body mass index is 31.85 kg/m. Physical Exam Vitals signs reviewed.  Constitutional:      Appearance: Normal appearance. She is obese.  Cardiovascular:     Rate and Rhythm: Normal rate.     Pulses: Normal pulses.  Pulmonary:     Effort: Pulmonary effort is normal.  Musculoskeletal: Normal range of motion.  Skin:    General: Skin is warm and dry.  Neurological:     Mental Status: She is alert and oriented to person, place, and time.  Psychiatric:        Mood and Affect: Mood normal.        Behavior: Behavior normal.     RECENT LABS AND TESTS: BMET    Component Value Date/Time   NA 138 03/02/2018 0958   K 4.6 03/02/2018 0958   CL 102 03/02/2018 0958   CO2 24 03/02/2018 0958   GLUCOSE 91 03/02/2018 0958   GLUCOSE 89 03/23/2017 0841   BUN 13 03/02/2018 0958   CREATININE 0.78 03/02/2018 0958   CREATININE 0.81 03/23/2017 0841   CALCIUM 9.5 03/02/2018 0958   GFRNONAA 92 03/02/2018 0958   GFRAA 106 03/02/2018 0958   Lab Results  Component Value Date   HGBA1C 5.7 (H) 03/02/2018   HGBA1C 5.5 11/03/2017   HGBA1C 5.5 03/23/2017   HGBA1C 5.4 02/17/2016   HGBA1C 5.6 02/13/2015   Lab Results  Component Value Date   INSULIN 14.0 03/02/2018   INSULIN 10.7 11/03/2017   CBC  Component Value Date/Time   WBC 5.1 03/02/2018 0958   WBC 5.2 03/23/2017 0841   RBC 5.27 03/02/2018 0958   RBC 4.68 03/23/2017 0841   HGB 13.3 03/02/2018 0958   HCT 41.4 03/02/2018 0958   PLT 259 03/23/2017 0841   MCV 79 03/02/2018 0958   MCH 25.2 (L) 03/02/2018 0958   MCH 27.8  03/23/2017 0841   MCHC 32.1 03/02/2018 0958   MCHC 33.9 03/23/2017 0841   RDW 17.0 (H) 03/02/2018 0958   LYMPHSABS 1.5 03/02/2018 0958   MONOABS 1.5 (H) 07/30/2015 0521   EOSABS 0.0 03/02/2018 0958   BASOSABS 0.1 03/02/2018 0958   Iron/TIBC/Ferritin/ %Sat    Component Value Date/Time   IRON 97 02/13/2015 0001   TIBC 339 02/13/2015 0001   IRONPCTSAT 29 02/13/2015 0001   Lipid Panel     Component Value Date/Time   CHOL 235 (H) 11/03/2017 1153   TRIG 82 11/03/2017 1153   HDL 85 11/03/2017 1153   CHOLHDL 2.7 03/23/2017 0841   VLDL 24 02/17/2016 0728   LDLCALC 134 (H) 11/03/2017 1153   LDLCALC 119 (H) 03/23/2017 0841   Hepatic Function Panel     Component Value Date/Time   PROT 7.3 03/02/2018 0958   ALBUMIN 4.1 03/02/2018 0958   AST 20 03/02/2018 0958   ALT 20 03/02/2018 0958   ALKPHOS 89 03/02/2018 0958   BILITOT 0.2 03/02/2018 0958   BILIDIR 0.1 08/21/2014 0853   IBILI 0.2 08/21/2014 0853      Component Value Date/Time   TSH 1.480 11/03/2017 1153   TSH 2.63 02/17/2016 0728   TSH 2.697 08/09/2013 1031     Ref. Range 03/02/2018 09:58  Vitamin D, 25-Hydroxy Latest Ref Range: 30.0 - 100.0 ng/mL 38.9     OBESITY BEHAVIORAL INTERVENTION VISIT  Today's visit was # 8   Starting weight: 211 lbs Starting date: 11/03/2017 Today's weight :: 222 lbs Today's date: 06/21/2018 Total lbs lost to date: 0   ASK: We discussed the diagnosis of obesity with Moncia H Abdon today and Videl agreed to give [name] permission to discuss obesity behavioral modification therapy today.  ASSESS: Solita has the diagnosis of obesity and her BMI today is 31.85 Anissia is in the action stage of change   ADVISE: Chanti was educated on the multiple health risks of obesity as well as the benefit of weight loss to improve her health. She was advised of the need for long term treatment and the importance of lifestyle modifications to improve her current health and to decrease her risk of future health  problems.  AGREE: Multiple dietary modification options and treatment options were discussed and  Mihika agreed to follow the recommendations documented in the above note.  ARRANGE: Juda was educated on the importance of frequent visits to treat obesity as outlined per CMS and USPSTF guidelines and agreed to schedule her next follow up appointment today.  I, Tammy Wysor, am acting as Location manager for [employer], PA-C I, Abby Potash, PA-C have reviewed above note and agree with its content

## 2018-06-24 DIAGNOSIS — J3081 Allergic rhinitis due to animal (cat) (dog) hair and dander: Secondary | ICD-10-CM | POA: Diagnosis not present

## 2018-06-24 DIAGNOSIS — J3089 Other allergic rhinitis: Secondary | ICD-10-CM | POA: Diagnosis not present

## 2018-06-24 DIAGNOSIS — J301 Allergic rhinitis due to pollen: Secondary | ICD-10-CM | POA: Diagnosis not present

## 2018-07-01 ENCOUNTER — Other Ambulatory Visit (INDEPENDENT_AMBULATORY_CARE_PROVIDER_SITE_OTHER): Payer: Self-pay | Admitting: Physician Assistant

## 2018-07-01 DIAGNOSIS — Z6830 Body mass index (BMI) 30.0-30.9, adult: Principal | ICD-10-CM

## 2018-07-01 DIAGNOSIS — E669 Obesity, unspecified: Secondary | ICD-10-CM

## 2018-07-01 MED FILL — VIT D2 1.25 MG (50,000 UNIT: 1.25 MG | 28 days supply | Qty: 4 | Fill #0

## 2018-07-05 DIAGNOSIS — J301 Allergic rhinitis due to pollen: Secondary | ICD-10-CM | POA: Diagnosis not present

## 2018-07-05 DIAGNOSIS — J3089 Other allergic rhinitis: Secondary | ICD-10-CM | POA: Diagnosis not present

## 2018-07-05 DIAGNOSIS — J3081 Allergic rhinitis due to animal (cat) (dog) hair and dander: Secondary | ICD-10-CM | POA: Diagnosis not present

## 2018-07-07 ENCOUNTER — Encounter (INDEPENDENT_AMBULATORY_CARE_PROVIDER_SITE_OTHER): Payer: Self-pay

## 2018-07-07 ENCOUNTER — Ambulatory Visit (INDEPENDENT_AMBULATORY_CARE_PROVIDER_SITE_OTHER): Payer: 59 | Admitting: Physician Assistant

## 2018-07-11 ENCOUNTER — Other Ambulatory Visit: Payer: Self-pay | Admitting: Medical

## 2018-07-11 MED FILL — FAMOTIDINE 20 MG TABS: 20 | 30 days supply | Qty: 60 | Fill #0

## 2018-07-13 ENCOUNTER — Encounter (INDEPENDENT_AMBULATORY_CARE_PROVIDER_SITE_OTHER): Payer: Self-pay | Admitting: Physician Assistant

## 2018-07-13 ENCOUNTER — Ambulatory Visit (INDEPENDENT_AMBULATORY_CARE_PROVIDER_SITE_OTHER): Payer: Commercial Managed Care - PPO | Admitting: Physician Assistant

## 2018-07-13 VITALS — BP 113/74 | HR 74 | Temp 98.1°F | Ht 66.0 in | Wt 224.0 lb

## 2018-07-13 DIAGNOSIS — Z9189 Other specified personal risk factors, not elsewhere classified: Secondary | ICD-10-CM

## 2018-07-13 DIAGNOSIS — E559 Vitamin D deficiency, unspecified: Secondary | ICD-10-CM

## 2018-07-13 DIAGNOSIS — E8881 Metabolic syndrome: Secondary | ICD-10-CM | POA: Diagnosis not present

## 2018-07-13 DIAGNOSIS — Z6836 Body mass index (BMI) 36.0-36.9, adult: Secondary | ICD-10-CM

## 2018-07-13 MED ORDER — VITAMIN D (ERGOCALCIFEROL) 1.25 MG (50000 UNIT) PO CAPS: 50000.0000 [IU] | ORAL_CAPSULE | ORAL | 0 refills | Status: DC

## 2018-07-13 NOTE — Progress Notes (Signed)
Office: [phone]  /  Fax: [phone]   HPI:   Chief Complaint: OBESITY Michelle Griffin is here to discuss her progress with her obesity treatment plan. She is on the Category 3 plan and is following her eating plan approximately 80-90% of the time. She states she is exercising 0 minutes 0 times per week. Michelle Griffin just returned from a cruise where she did not eat on plan. She continues to want to eat plant based but is willing to eat some meat at times.   Her weight is 224 lb (101.6 kg) today and has had a weight gain of 2 pounds since her last visit. She has lost 0 lbs since starting treatment with [name].  Vitamin D deficiency Michelle Griffin has a diagnosis of Vitamin D deficiency. She is currently taking prescription Vit D and denies nausea, vomiting or muscle weakness.  Insulin Resistance Michelle Griffin has a diagnosis of insulin resistance based on her elevated fasting insulin level >5. Although Michelle Griffin's blood glucose readings are still under good control, insulin resistance puts her at greater risk of metabolic syndrome and diabetes. She is not taking metformin currently and continues to work on diet and exercise to decrease risk of diabetes. Michelle Griffin reports some polyphagia but wants to wait to restart Contrave.   At risk for diabetes Michelle Griffin is at higher than averagerisk for developing diabetes due to her obesity. She currently denies polyuria or polydipsia.  ASSESSMENT AND PLAN:  Vitamin D deficiency - Plan: Vitamin D, Ergocalciferol, (DRISDOL) 1.25 MG (50000 UT) CAPS capsule  Insulin resistance  At risk for diabetes mellitus  Class 2 severe obesity with serious comorbidity and body mass index (BMI) of 36.0 to 36.9 in adult, unspecified obesity type ([place])  PLAN:  Vitamin D Deficiency Michelle Griffin was informed that low vitamin D levels contributes to fatigue and are associated with obesity, breast, and colon cancer. She agrees to continue to take prescription Vit D @ 50,000 IU every week #4 with no refills and will  follow-up for routine testing of Vitamin D, at least 2-3 times per year. She was informed of the risk of over-replacement of Vitamin D and agrees to not increase her dose unless she discusses this with [name] first. Michelle Griffin agrees to follow-up with our clinic in 2 weeks.  Insulin Resistance Michelle Griffin will continue to work on weight loss, exercise, and decreasing simple carbohydrates in her diet to help decrease the risk of diabetes. We dicussed metformin including benefits and risks. She was informed that eating too many simple carbohydrates or too many calories at one sitting increases the likelihood of GI side effects. Michelle Griffin is not on metformin for now and prescription was not written today. Michelle Griffin agreed to follow-up with [name] as directed to monitor her progress.  Diabetes risk counseling Michelle Griffin was given extended (15 minutes) diabetes prevention counseling today. She is 48 y.o. female and has risk factors for diabetes including obesity. We discussed intensive lifestyle modifications today with an emphasis on weight loss as well as increasing exercise and decreasing simple carbohydrates in her diet.  Obesity Michelle Griffin is currently in the action stage of change. As such, her goal is to continue with weight loss efforts. She has agreed to follow the Category 3 plan. Michelle Griffin has been instructed to work up to a goal of 150 minutes of combined cardio and strengthening exercise per week for weight loss and overall health benefits. We discussed the following Behavioral Modification Strategies today: increasing lean protein intake and work on meal planning and easy cooking plans.  Michelle Griffin has agreed to follow-up with our clinic in 2 weeks. She was informed of the importance of frequent follow up visits to maximize her success with intensive lifestyle modifications for her multiple health conditions.  ALLERGIES: Allergies  Allergen Reactions  . Sulfa Antibiotics Hives    MEDICATIONS: Current Outpatient Medications on  File Prior to Visit  Medication Sig Dispense Refill  . albuterol (PROVENTIL HFA;VENTOLIN HFA) 108 (90 BASE) MCG/ACT inhaler Inhale 2 puffs into the lungs every 6 (six) hours as needed for wheezing or shortness of breath. 1 Inhaler 0  . azelastine (ASTELIN) 0.1 % nasal spray Place 2 sprays into both nostrils 2 (two) times daily. Use in each nostril as directed 30 mL 0  . EPINEPHrine (EPI-PEN) 0.3 mg/0.3 mL SOAJ injection Inject 0.3 mg into the muscle once.    . famotidine (PEPCID) 40 MG tablet TAKE 1 TABLET BY MOUTH ONCE DAILY 90 tablet 0  . fluticasone (FLONASE) 50 MCG/ACT nasal spray Place into both nostrils daily.    [name] ibuprofen (ADVIL,MOTRIN) 200 MG tablet Take 600 mg by mouth every 4 (four) hours as needed.    . Lactobacillus (PROBIOTIC ACIDOPHILUS) CAPS Take 1 capsule by mouth daily.    [name] levocetirizine (XYZAL) 5 MG tablet Take 5 mg by mouth every evening.    . montelukast (SINGULAIR) 10 MG tablet Take 10 mg by mouth at bedtime.    . Multiple Vitamins-Minerals (MULTIVITAMIN WITH MINERALS) tablet Take 1 tablet by mouth daily.    . Naltrexone-buPROPion HCl ER (CONTRAVE) 8-90 MG TB12 TAKE 2 TABLETS BY MOUTH 2 (TWO) TIMES DAILY. 120 tablet 0  . nitroGLYCERIN (NITRODUR - DOSED IN MG/24 HR) 0.2 mg/hr patch PLACE 1 PATCH (0.2 MG TOTAL) ONTO THE SKIN DAILY. 30 patch 11  . sertraline (ZOLOFT) 100 MG tablet TAKE 1 TABLET BY MOUTH ONCE DAILY 90 tablet 0  . Vitamin D, Ergocalciferol, (DRISDOL) 1.25 MG (50000 UT) CAPS capsule Take 1 capsule (50,000 Units total) by mouth every 7 (seven) days. 4 capsule 0   No current facility-administered medications on file prior to visit.     PAST MEDICAL HISTORY: Past Medical History:  Diagnosis Date  . Allergy    sees [name]  . Anemia    hx/o iron deficiency due to heavy periods  . Asthma   . Bursitis   . DDD (degenerative disc disease), lumbar   . Fatty liver   . GERD (gastroesophageal reflux disease)    [name]  . H/O mammogram   . Hiatal hernia     small per EGD, [name]  . Hyperlipidemia 2012   was on pravastatin prior  . Migraine    prior eval with neurologist, Dr. Melton Alar  . Overactive bladder   . PMDD (premenstrual dysphoric disorder)   . Pyelonephritis    age 54yo  . Routine gynecological examination    [place], [name], NP  . Uterine infection 07/2015   s/p novasure ablation  . Vitamin D deficiency   . Wears glasses     PAST SURGICAL HISTORY: Past Surgical History:  Procedure Laterality Date  . CHOLECYSTECTOMY    . COLONOSCOPY  2014   repeat 5 years; [name], baseline  . ESOPHAGOGASTRODUODENOSCOPY  2014   [name]  . FOOT SURGERY Left    morton's neuroma  . LASIK     [name]  . LUMBAR DISC SURGERY  07/2013   due to herniated disc, [name]  . NOVASURE ABLATION    . WISDOM  TOOTH EXTRACTION      SOCIAL HISTORY: Social History   Tobacco Use  . Smoking status: Never Smoker  . Smokeless tobacco: Never Used  Substance Use Topics  . Alcohol use: Yes    Alcohol/week: 3.0 standard drinks    Types: 3 Cans of beer per week    Comment: socially  . Drug use: No    FAMILY HISTORY: Family History  Problem Relation Age of Onset  . Lupus Mother   . Hypertension Mother   . Hyperlipidemia Mother   . Migraines Mother   . Kidney disease Mother   . Hypertension Father   . Diabetes Father   . Hyperlipidemia Father   . Depression Father   . Anxiety disorder Father   . Dementia Father   . Obesity Sister   . Hypertension Sister   . Diabetes Sister   . Heart disease Paternal Uncle        CABG  . Hyperlipidemia Paternal Uncle   . Cancer Paternal Uncle        multiple with colon cancer  . Cancer Cousin        breast  . Cancer Cousin        ovarian  . Ovarian cysts Daughter   . Stroke Neg Hx     ROS: Review of Systems  Constitutional: Negative for weight loss.  Gastrointestinal: Negative for nausea and vomiting.  Musculoskeletal:       Negative for muscle weakness.    Endo/Heme/Allergies:       Positive for polyphagia. Negative for hypoglycemia.   PHYSICAL EXAM: Blood pressure 113/74, pulse 74, temperature 98.1 F (36.7 C), temperature source Oral, height 5\' 6"  (1.676 m), weight 224 lb (101.6 kg), SpO2 99 %. Body mass index is 36.15 kg/m. Physical Exam Vitals signs reviewed.  Constitutional:      Appearance: Normal appearance. She is obese.  Cardiovascular:     Rate and Rhythm: Normal rate.     Pulses: Normal pulses.  Pulmonary:     Effort: Pulmonary effort is normal.     Breath sounds: Normal breath sounds.  Musculoskeletal: Normal range of motion.  Skin:    General: Skin is warm and dry.  Neurological:     Mental Status: She is alert and oriented to person, place, and time.  Psychiatric:        Behavior: Behavior normal.   RECENT LABS AND TESTS: BMET    Component Value Date/Time   NA 138 03/02/2018 0958   K 4.6 03/02/2018 0958   CL 102 03/02/2018 0958   CO2 24 03/02/2018 0958   GLUCOSE 91 03/02/2018 0958   GLUCOSE 89 03/23/2017 0841   BUN 13 03/02/2018 0958   CREATININE 0.78 03/02/2018 0958   CREATININE 0.81 03/23/2017 0841   CALCIUM 9.5 03/02/2018 0958   GFRNONAA 92 03/02/2018 0958   GFRAA 106 03/02/2018 0958   Lab Results  Component Value Date   HGBA1C 5.7 (H) 03/02/2018   HGBA1C 5.5 11/03/2017   HGBA1C 5.5 03/23/2017   HGBA1C 5.4 02/17/2016   HGBA1C 5.6 02/13/2015   Lab Results  Component Value Date   INSULIN 14.0 03/02/2018   INSULIN 10.7 11/03/2017   CBC    Component Value Date/Time   WBC 5.1 03/02/2018 0958   WBC 5.2 03/23/2017 0841   RBC 5.27 03/02/2018 0958   RBC 4.68 03/23/2017 0841   HGB 13.3 03/02/2018 0958   HCT 41.4 03/02/2018 0958   PLT 259 03/23/2017 0841   MCV 79 03/02/2018  0958   MCH 25.2 (L) 03/02/2018 0958   MCH 27.8 03/23/2017 0841   MCHC 32.1 03/02/2018 0958   MCHC 33.9 03/23/2017 0841   RDW 17.0 (H) 03/02/2018 0958   LYMPHSABS 1.5 03/02/2018 0958   MONOABS 1.5 (H) 07/30/2015 0521    EOSABS 0.0 03/02/2018 0958   BASOSABS 0.1 03/02/2018 0958   Iron/TIBC/Ferritin/ %Sat    Component Value Date/Time   IRON 97 02/13/2015 0001   TIBC 339 02/13/2015 0001   IRONPCTSAT 29 02/13/2015 0001   Lipid Panel     Component Value Date/Time   CHOL 235 (H) 11/03/2017 1153   TRIG 82 11/03/2017 1153   HDL 85 11/03/2017 1153   CHOLHDL 2.7 03/23/2017 0841   VLDL 24 02/17/2016 0728   LDLCALC 134 (H) 11/03/2017 1153   LDLCALC 119 (H) 03/23/2017 0841   Hepatic Function Panel     Component Value Date/Time   PROT 7.3 03/02/2018 0958   ALBUMIN 4.1 03/02/2018 0958   AST 20 03/02/2018 0958   ALT 20 03/02/2018 0958   ALKPHOS 89 03/02/2018 0958   BILITOT 0.2 03/02/2018 0958   BILIDIR 0.1 08/21/2014 0853   IBILI 0.2 08/21/2014 0853      Component Value Date/Time   TSH 1.480 11/03/2017 1153   TSH 2.63 02/17/2016 0728   TSH 2.697 08/09/2013 1031   Results for [place], KEYSI OELKERS (MRN [id number]) as of 07/13/2018 16:24  Ref. Range 03/02/2018 09:58  Vitamin D, 25-Hydroxy Latest Ref Range: 30.0 - 100.0 ng/mL 38.9   OBESITY BEHAVIORAL INTERVENTION VISIT  Today's visit was #9  Starting weight: 211 lbs Starting date: 11/03/2017 Today's weight: 224 lbs Today's date: 07/13/2018 Total lbs lost to date: 0  ASK: We discussed the diagnosis of obesity with Michelle Griffin today and Brynleigh agreed to give [name] permission to discuss obesity behavioral modification therapy today.  ASSESS: Arin has the diagnosis of obesity and her BMI today is 36.15. Michelle Griffin is in the action stage of change.  ADVISE: Michelle Griffin was educated on the multiple health risks of obsity as well as the benefit of weight loss to improve her health. She was advised of the need for long term treatment and the importance of lifestyle modifications to improve her current health and to decrease her risk of future health problems.  AGREE: Multiple dietary modification options and treatment options were discussed and  Michelle Griffin agreed to  follow the recommendations documented in the above note.  ARRANGE: Michelle Griffin was educated on the importance of frequent visits to treat obesity as outlined per CMS and USPSTF guidelines and agreed to schedule her next follow up appointment today.  Migdalia Dk, am acting as transcriptionist for Abby Potash, PA-C I, Abby Potash, PA-C have reviewed above note and agree with its content

## 2018-07-14 DIAGNOSIS — J301 Allergic rhinitis due to pollen: Secondary | ICD-10-CM | POA: Diagnosis not present

## 2018-07-14 DIAGNOSIS — J3089 Other allergic rhinitis: Secondary | ICD-10-CM | POA: Diagnosis not present

## 2018-07-14 DIAGNOSIS — J3081 Allergic rhinitis due to animal (cat) (dog) hair and dander: Secondary | ICD-10-CM | POA: Diagnosis not present

## 2018-07-21 ENCOUNTER — Ambulatory Visit
Admission: RE | Admit: 2018-07-21 | Discharge: 2018-07-21 | Disposition: A | Discharge status: Home / Self Care | Payer: 59 | Source: Ambulatory Visit | Attending: Obstetrics and Gynecology | Admitting: Obstetrics and Gynecology

## 2018-07-21 DIAGNOSIS — Z1231 Encounter for screening mammogram for malignant neoplasm of breast: Secondary | ICD-10-CM | POA: Diagnosis not present

## 2018-07-25 ENCOUNTER — Other Ambulatory Visit: Payer: Self-pay | Admitting: Obstetrics and Gynecology

## 2018-07-25 DIAGNOSIS — R928 Other abnormal and inconclusive findings on diagnostic imaging of breast: Secondary | ICD-10-CM

## 2018-07-26 ENCOUNTER — Ambulatory Visit
Admission: RE | Admit: 2018-07-26 | Discharge: 2018-07-26 | Disposition: A | Discharge status: Home / Self Care | Payer: 59 | Source: Ambulatory Visit | Attending: Obstetrics and Gynecology | Admitting: Obstetrics and Gynecology

## 2018-07-26 ENCOUNTER — Ambulatory Visit: Payer: Self-pay

## 2018-07-26 DIAGNOSIS — J3081 Allergic rhinitis due to animal (cat) (dog) hair and dander: Secondary | ICD-10-CM | POA: Diagnosis not present

## 2018-07-26 DIAGNOSIS — R928 Other abnormal and inconclusive findings on diagnostic imaging of breast: Secondary | ICD-10-CM

## 2018-07-26 DIAGNOSIS — R922 Inconclusive mammogram: Secondary | ICD-10-CM | POA: Diagnosis not present

## 2018-07-26 DIAGNOSIS — J3089 Other allergic rhinitis: Secondary | ICD-10-CM | POA: Diagnosis not present

## 2018-07-26 DIAGNOSIS — J301 Allergic rhinitis due to pollen: Secondary | ICD-10-CM | POA: Diagnosis not present

## 2018-07-27 MED FILL — MONTELUKAST SOD 10 MG TAB: 10 | 90 days supply | Qty: 90 | Fill #1

## 2018-07-27 MED FILL — LEVOCETIRIZINE 5 MG TABLET: 5 | 90 days supply | Qty: 90 | Fill #1

## 2018-07-28 ENCOUNTER — Ambulatory Visit (INDEPENDENT_AMBULATORY_CARE_PROVIDER_SITE_OTHER): Payer: Commercial Managed Care - PPO | Admitting: Physician Assistant

## 2018-07-28 DIAGNOSIS — J301 Allergic rhinitis due to pollen: Secondary | ICD-10-CM | POA: Diagnosis not present

## 2018-07-28 DIAGNOSIS — J3089 Other allergic rhinitis: Secondary | ICD-10-CM | POA: Diagnosis not present

## 2018-07-28 DIAGNOSIS — J3081 Allergic rhinitis due to animal (cat) (dog) hair and dander: Secondary | ICD-10-CM | POA: Diagnosis not present

## 2018-08-01 ENCOUNTER — Ambulatory Visit (INDEPENDENT_AMBULATORY_CARE_PROVIDER_SITE_OTHER): Payer: 59 | Admitting: Physician Assistant

## 2018-08-01 ENCOUNTER — Encounter (INDEPENDENT_AMBULATORY_CARE_PROVIDER_SITE_OTHER): Payer: Self-pay | Admitting: Physician Assistant

## 2018-08-01 VITALS — BP 126/73 | HR 76 | Ht 66.0 in | Wt 225.0 lb

## 2018-08-01 DIAGNOSIS — E8881 Metabolic syndrome: Secondary | ICD-10-CM | POA: Diagnosis not present

## 2018-08-01 DIAGNOSIS — Z6836 Body mass index (BMI) 36.0-36.9, adult: Secondary | ICD-10-CM

## 2018-08-01 DIAGNOSIS — Z9189 Other specified personal risk factors, not elsewhere classified: Secondary | ICD-10-CM | POA: Diagnosis not present

## 2018-08-01 DIAGNOSIS — E559 Vitamin D deficiency, unspecified: Secondary | ICD-10-CM | POA: Diagnosis not present

## 2018-08-01 MED ORDER — VITAMIN D (ERGOCALCIFEROL) 1.25 MG (50000 UNIT) PO CAPS: 50000.0000 [IU] | ORAL_CAPSULE | ORAL | 0 refills | Status: DC

## 2018-08-01 NOTE — Progress Notes (Signed)
Office: [phone]  /  Fax: [phone]   HPI:   Chief Complaint: OBESITY Michelle Griffin is here to discuss her progress with her obesity treatment plan. She is on the Category 3 plan and is following her eating plan approximately 80% of the time. She states she is exercising 0 minutes 0 times per week. Michelle Griffin reports that she continues to try to eat plant based with her husband. She is not meeting her protein goal daily. Her weight is 225 lb (102.1 kg) today and has had a weight gain of 1 pound since her last visit. She has lost 0 lbs since starting treatment with [name].  Vitamin D deficiency Michelle Griffin has a diagnosis of Vitamin D deficiency. She is currently taking prescription Vit D and denies nausea, vomiting or muscle weakness.  At risk for osteopenia and osteoporosis Michelle Griffin is at higher risk of osteopenia and osteoporosis due to vitamin D deficiency.   Insulin Resistance Michelle Griffin has a diagnosis of insulin resistance based on her elevated fasting insulin level >5. Although Michelle Griffin's blood glucose readings are still under good control, insulin resistance puts her at greater risk of metabolic syndrome and diabetes. She is not taking metformin currently and continues to work on diet and exercise to decrease risk of diabetes. She denies polyphagia.  ASSESSMENT AND PLAN:  Vitamin D deficiency - Plan: Vitamin D, Ergocalciferol, (DRISDOL) 1.25 MG (50000 UT) CAPS capsule  Insulin resistance  At risk for osteoporosis  Class 2 severe obesity with serious comorbidity and body mass index (BMI) of 36.0 to 36.9 in adult, unspecified obesity type ([place])  PLAN:  Vitamin D Deficiency Michelle Griffin was informed that low Vitamin D levels contributes to fatigue and are associated with obesity, breast, and colon cancer. She agrees to continue to take prescription Vit D @ 50,000 IU every week #4 with 0 refills and will follow-up for routine testing of Vitamin D, at least 2-3 times per year. She was informed of the risk of  over-replacement of Vitamin D and agrees to not increase her dose unless she discusses this with [name] first. Michelle Griffin agrees to follow-up with our clinic in 4 weeks.  At risk for osteopenia and osteoporosis Michelle Griffin was given extended  (15 minutes) osteoporosis prevention counseling today. Michelle Griffin is at risk for osteopenia and osteoporsis due to her Vitamin D deficiency. She was encouraged to take her Vitamin D and follow her higher calcium diet and increase strengthening exercise to help strengthen her bones and decrease her risk of osteopenia and osteoporosis.  Insulin Resistance Michelle Griffin will continue to work on weight loss, exercise, and decreasing simple carbohydrates in her diet to help decrease the risk of diabetes. We dicussed metformin including benefits and risks. She was informed that eating too many simple carbohydrates or too many calories at one sitting increases the likelihood of GI side effects. Michelle Griffin agreed to follow-up with [name] as directed to monitor her progress.  Obesity Michelle Griffin is currently in the action stage of change. As such, her goal is to continue with weight loss efforts. She has agreed to change to keeping a food journal with 1500 calories and 95 grams of protein daily. Michelle Griffin has been instructed to work up to a goal of 150 minutes of combined cardio and strengthening exercise per week for weight loss and overall health benefits. We discussed the following Behavioral Modification Strategies today: increasing lean protein intake and keep a strict food journal.  Michelle Griffin has agreed to follow-up with our clinic in 4 weeks. She was informed of  the importance of frequent follow-up visits to maximize her success with intensive lifestyle modifications for her multiple health conditions.  ALLERGIES: Allergies  Allergen Reactions  . Sulfa Antibiotics Hives    MEDICATIONS: Current Outpatient Medications on File Prior to Visit  Medication Sig Dispense Refill  . albuterol (PROVENTIL  HFA;VENTOLIN HFA) 108 (90 BASE) MCG/ACT inhaler Inhale 2 puffs into the lungs every 6 (six) hours as needed for wheezing or shortness of breath. 1 Inhaler 0  . azelastine (ASTELIN) 0.1 % nasal spray Place 2 sprays into both nostrils 2 (two) times daily. Use in each nostril as directed 30 mL 0  . EPINEPHrine (EPI-PEN) 0.3 mg/0.3 mL SOAJ injection Inject 0.3 mg into the muscle once.    . famotidine (PEPCID) 40 MG tablet TAKE 1 TABLET BY MOUTH ONCE DAILY 90 tablet 0  . fluticasone (FLONASE) 50 MCG/ACT nasal spray Place into both nostrils daily.    [name] ibuprofen (ADVIL,MOTRIN) 200 MG tablet Take 600 mg by mouth every 4 (four) hours as needed.    . Lactobacillus (PROBIOTIC ACIDOPHILUS) CAPS Take 1 capsule by mouth daily.    [name] levocetirizine (XYZAL) 5 MG tablet Take 5 mg by mouth every evening.    . montelukast (SINGULAIR) 10 MG tablet Take 10 mg by mouth at bedtime.    . Multiple Vitamins-Minerals (MULTIVITAMIN WITH MINERALS) tablet Take 1 tablet by mouth daily.    . nitroGLYCERIN (NITRODUR - DOSED IN MG/24 HR) 0.2 mg/hr patch PLACE 1 PATCH (0.2 MG TOTAL) ONTO THE SKIN DAILY. 30 patch 11  . sertraline (ZOLOFT) 100 MG tablet TAKE 1 TABLET BY MOUTH ONCE DAILY 90 tablet 0   No current facility-administered medications on file prior to visit.     PAST MEDICAL HISTORY: Past Medical History:  Diagnosis Date  . Allergy    sees [name]  . Anemia    hx/o iron deficiency due to heavy periods  . Asthma   . Bursitis   . DDD (degenerative disc disease), lumbar   . Fatty liver   . GERD (gastroesophageal reflux disease)    [name]  . H/O mammogram   . Hiatal hernia    small per EGD, [name]  . Hyperlipidemia 2012   was on pravastatin prior  . Migraine    prior eval with neurologist, Dr. Melton Alar  . Overactive bladder   . PMDD (premenstrual dysphoric disorder)   . Pyelonephritis    age 42yo  . Routine gynecological examination    [place], [name], NP  . Uterine infection  07/2015   s/p novasure ablation  . Vitamin D deficiency   . Wears glasses     PAST SURGICAL HISTORY: Past Surgical History:  Procedure Laterality Date  . CHOLECYSTECTOMY    . COLONOSCOPY  2014   repeat 5 years; [name], baseline  . ESOPHAGOGASTRODUODENOSCOPY  2014   [name]  . FOOT SURGERY Left    morton's neuroma  . LASIK     [name]  . LUMBAR DISC SURGERY  07/2013   due to herniated disc, [name]  . NOVASURE ABLATION    . WISDOM TOOTH EXTRACTION      SOCIAL HISTORY: Social History   Tobacco Use  . Smoking status: Never Smoker  . Smokeless tobacco: Never Used  Substance Use Topics  . Alcohol use: Yes    Alcohol/week: 3.0 standard drinks    Types: 3 Cans of beer per week    Comment: socially  . Drug use: No  FAMILY HISTORY: Family History  Problem Relation Age of Onset  . Lupus Mother   . Hypertension Mother   . Hyperlipidemia Mother   . Migraines Mother   . Kidney disease Mother   . Hypertension Father   . Diabetes Father   . Hyperlipidemia Father   . Depression Father   . Anxiety disorder Father   . Dementia Father   . Obesity Sister   . Hypertension Sister   . Diabetes Sister   . Heart disease Paternal Uncle        CABG  . Hyperlipidemia Paternal Uncle   . Cancer Paternal Uncle        multiple with colon cancer  . Cancer Cousin        breast  . Cancer Cousin        ovarian  . Ovarian cysts Daughter   . Stroke Neg Hx    ROS: Review of Systems  Constitutional: Negative for weight loss.  Gastrointestinal: Negative for nausea and vomiting.  Musculoskeletal:       Negative for muscle weakness.  Endo/Heme/Allergies:       Negative for polyphagia. Negative for hypoglycemia.   PHYSICAL EXAM: Blood pressure 126/73, pulse 76, height 5\' 6"  (1.676 m), weight 225 lb (102.1 kg), SpO2 97 %. Body mass index is 36.32 kg/m. Physical Exam Vitals signs reviewed.  Constitutional:      Appearance: Normal appearance. She is obese.    Cardiovascular:     Rate and Rhythm: Normal rate.     Pulses: Normal pulses.  Pulmonary:     Effort: Pulmonary effort is normal.     Breath sounds: Normal breath sounds.  Musculoskeletal: Normal range of motion.  Skin:    General: Skin is warm and dry.  Neurological:     Mental Status: She is alert and oriented to person, place, and time.  Psychiatric:        Behavior: Behavior normal.   RECENT LABS AND TESTS: BMET    Component Value Date/Time   NA 138 03/02/2018 0958   K 4.6 03/02/2018 0958   CL 102 03/02/2018 0958   CO2 24 03/02/2018 0958   GLUCOSE 91 03/02/2018 0958   GLUCOSE 89 03/23/2017 0841   BUN 13 03/02/2018 0958   CREATININE 0.78 03/02/2018 0958   CREATININE 0.81 03/23/2017 0841   CALCIUM 9.5 03/02/2018 0958   GFRNONAA 92 03/02/2018 0958   GFRAA 106 03/02/2018 0958   Lab Results  Component Value Date   HGBA1C 5.7 (H) 03/02/2018   HGBA1C 5.5 11/03/2017   HGBA1C 5.5 03/23/2017   HGBA1C 5.4 02/17/2016   HGBA1C 5.6 02/13/2015   Lab Results  Component Value Date   INSULIN 14.0 03/02/2018   INSULIN 10.7 11/03/2017   CBC    Component Value Date/Time   WBC 5.1 03/02/2018 0958   WBC 5.2 03/23/2017 0841   RBC 5.27 03/02/2018 0958   RBC 4.68 03/23/2017 0841   HGB 13.3 03/02/2018 0958   HCT 41.4 03/02/2018 0958   PLT 259 03/23/2017 0841   MCV 79 03/02/2018 0958   MCH 25.2 (L) 03/02/2018 0958   MCH 27.8 03/23/2017 0841   MCHC 32.1 03/02/2018 0958   MCHC 33.9 03/23/2017 0841   RDW 17.0 (H) 03/02/2018 0958   LYMPHSABS 1.5 03/02/2018 0958   MONOABS 1.5 (H) 07/30/2015 0521   EOSABS 0.0 03/02/2018 0958   BASOSABS 0.1 03/02/2018 0958   Iron/TIBC/Ferritin/ %Sat    Component Value Date/Time   IRON 97 02/13/2015 0001  TIBC 339 02/13/2015 0001   IRONPCTSAT 29 02/13/2015 0001   Lipid Panel     Component Value Date/Time   CHOL 235 (H) 11/03/2017 1153   TRIG 82 11/03/2017 1153   HDL 85 11/03/2017 1153   CHOLHDL 2.7 03/23/2017 0841   VLDL 24 02/17/2016  0728   LDLCALC 134 (H) 11/03/2017 1153   LDLCALC 119 (H) 03/23/2017 0841   Hepatic Function Panel     Component Value Date/Time   PROT 7.3 03/02/2018 0958   ALBUMIN 4.1 03/02/2018 0958   AST 20 03/02/2018 0958   ALT 20 03/02/2018 0958   ALKPHOS 89 03/02/2018 0958   BILITOT 0.2 03/02/2018 0958   BILIDIR 0.1 08/21/2014 0853   IBILI 0.2 08/21/2014 0853      Component Value Date/Time   TSH 1.480 11/03/2017 1153   TSH 2.63 02/17/2016 0728   TSH 2.697 08/09/2013 1031    Ref. Range 03/02/2018 09:58  Vitamin D, 25-Hydroxy Latest Ref Range: 30.0 - 100.0 ng/mL 38.9   OBESITY BEHAVIORAL INTERVENTION VISIT  Today's visit was #10  Starting weight: 211 lbs Starting date: 11/03/2017 Today's weight: 225 lbs Today's date: 08/01/2018 Total lbs lost to date: 0    08/01/2018  Height 5\' 6"  (1.676 m)  Weight 225 lb (102.1 kg)  BMI (Calculated) 36.33  BLOOD PRESSURE - SYSTOLIC 553  BLOOD PRESSURE - DIASTOLIC 73   Body Fat % 74.8 %  Total Body Water (lbs) 82 lbs   ASK: We discussed the diagnosis of obesity with Michelle Griffin today and Michelle Griffin agreed to give [name] permission to discuss obesity behavioral modification therapy today.  ASSESS: Michelle Griffin has the diagnosis of obesity and her BMI today is 36.33. Ndea is in the action stage of change.   ADVISE: Eldine was educated on the multiple health risks of obesity as well as the benefit of weight loss to improve her health. She was advised of the need for long term treatment and the importance of lifestyle modifications to improve her current health and to decrease her risk of future health problems.  AGREE: Multiple dietary modification options and treatment options were discussed and  Nidhi agreed to follow the recommendations documented in the above note.  ARRANGE: Zeffie was educated on the importance of frequent visits to treat obesity as outlined per CMS and USPSTF guidelines and agreed to schedule her next follow up appointment today.  Migdalia Dk, am acting as transcriptionist for Abby Potash, PA-C I, Abby Potash, PA-C have reviewed above note and agree with its content

## 2018-08-03 DIAGNOSIS — J301 Allergic rhinitis due to pollen: Secondary | ICD-10-CM | POA: Diagnosis not present

## 2018-08-03 DIAGNOSIS — J3081 Allergic rhinitis due to animal (cat) (dog) hair and dander: Secondary | ICD-10-CM | POA: Diagnosis not present

## 2018-08-03 DIAGNOSIS — J3089 Other allergic rhinitis: Secondary | ICD-10-CM | POA: Diagnosis not present

## 2018-08-09 MED FILL — AZELASTINE HCL 137 MCG SPRY: 0.1 | 30 days supply | Qty: 30 | Fill #1

## 2018-08-11 DIAGNOSIS — J301 Allergic rhinitis due to pollen: Secondary | ICD-10-CM | POA: Diagnosis not present

## 2018-08-11 DIAGNOSIS — J3089 Other allergic rhinitis: Secondary | ICD-10-CM | POA: Diagnosis not present

## 2018-08-11 DIAGNOSIS — J3081 Allergic rhinitis due to animal (cat) (dog) hair and dander: Secondary | ICD-10-CM | POA: Diagnosis not present

## 2018-08-12 ENCOUNTER — Telehealth: Payer: 59 | Admitting: Nurse Practitioner

## 2018-08-12 DIAGNOSIS — B351 Tinea unguium: Secondary | ICD-10-CM

## 2018-08-12 NOTE — Progress Notes (Signed)
Based on what you shared with me it looks like you have fugal infection of nail bed,that should be evaluated in a face to face office visit. This is not an emergency but you will need to see your PCP for this.   NOTE: If you entered your credit card information for this eVisit, you will not be charged. You may see a "hold" on your card for the $30 but that hold will drop off and you will not have a charge processed.  If you are having a true medical emergency please call 911.  If you need an urgent face to face visit, [place] has four urgent care centers for your convenience.  If you need care fast and have a high deductible or no insurance consider:   [email or web address] to reserve your spot online an avoid wait times  [facility] [address], [address] [place], [place] [address] 8 am to 8 pm Monday-Friday 10 am to 4 pm Saturday-Sunday *Across the street from [employer]  [place], [address] 8 am to 5 pm Monday-Friday * In the [facility] on the [facility]   The following sites will take your  insurance:  . [facility] [place] a Provider at this Location  [address] [place], [place] [address] . 10 am to 8 pm Monday-Friday . 12 pm to 8 pm Saturday-Sunday   . [facility] [place] a Provider at this Location  [address], [employer] [place], [place] [address] . 8 am to 8 pm Monday-Friday . 9 am to 6 pm Saturday . 11 am to 6 pm Sunday   . [facility] at [place] Get Driving Directions  [address] [place], [place] [address] . 8 am to 8 pm Monday-Friday . 8 am to 4 pm Saturday-Sunday   Your e-visit answers were reviewed by a board certified advanced clinical practitioner to complete your personal care plan.  5  minutes spent reviewing and documenting in chart.

## 2018-08-14 ENCOUNTER — Encounter (INDEPENDENT_AMBULATORY_CARE_PROVIDER_SITE_OTHER): Payer: Self-pay | Admitting: Physician Assistant

## 2018-08-15 ENCOUNTER — Telehealth: Payer: Self-pay | Admitting: Family Medicine

## 2018-08-15 ENCOUNTER — Telehealth (INDEPENDENT_AMBULATORY_CARE_PROVIDER_SITE_OTHER): Payer: Self-pay | Admitting: *Deleted

## 2018-08-15 NOTE — Telephone Encounter (Signed)
Pt was last seen in the office 08/2016. She is requesting rx for nail fungus. Ok to advise of super glue application for this?

## 2018-08-15 NOTE — Telephone Encounter (Signed)
Pt emailed me asking if Dr. Sharol Given would call something in for Toe Nail Fungus, pt wants to steer clear of drs office if possible.   Please advise.   [phone]  Thanks

## 2018-08-15 NOTE — Telephone Encounter (Signed)
Yes, let her know about using superglue, for nail fungus medicine someone would have to check her liver because its hard on the liver

## 2018-08-15 NOTE — Telephone Encounter (Signed)
Pt said she was not coming in, said what was the point of having cone insurance if you cant even use it,

## 2018-08-15 NOTE — Telephone Encounter (Signed)
Patient wants to know if you will call her in something for toe fungus to out pt pharm?

## 2018-08-15 NOTE — Telephone Encounter (Signed)
She would need appt and possible a sample of toenail to send into lab.

## 2018-08-16 NOTE — Telephone Encounter (Signed)
I called pt and advised of cyanoacrylate- super glue to file nail surface and cover with application of glue. To file off on day three and repeat this step of file and reapplication for 6 weeks. At that time the pt will call if she does not see improvement and make an appt to come into the office. Will call with any questions.

## 2018-08-19 DIAGNOSIS — J3089 Other allergic rhinitis: Secondary | ICD-10-CM | POA: Diagnosis not present

## 2018-08-19 DIAGNOSIS — J3081 Allergic rhinitis due to animal (cat) (dog) hair and dander: Secondary | ICD-10-CM | POA: Diagnosis not present

## 2018-08-19 DIAGNOSIS — J301 Allergic rhinitis due to pollen: Secondary | ICD-10-CM | POA: Diagnosis not present

## 2018-08-20 ENCOUNTER — Other Ambulatory Visit: Payer: Self-pay

## 2018-08-20 ENCOUNTER — Other Ambulatory Visit (INDEPENDENT_AMBULATORY_CARE_PROVIDER_SITE_OTHER): Payer: Self-pay | Admitting: Physician Assistant

## 2018-08-20 ENCOUNTER — Encounter (INDEPENDENT_AMBULATORY_CARE_PROVIDER_SITE_OTHER): Payer: Self-pay | Admitting: Physician Assistant

## 2018-08-20 DIAGNOSIS — E559 Vitamin D deficiency, unspecified: Secondary | ICD-10-CM

## 2018-08-22 ENCOUNTER — Other Ambulatory Visit: Payer: Self-pay

## 2018-08-22 ENCOUNTER — Other Ambulatory Visit (INDEPENDENT_AMBULATORY_CARE_PROVIDER_SITE_OTHER): Payer: Self-pay | Admitting: Physician Assistant

## 2018-08-22 DIAGNOSIS — E559 Vitamin D deficiency, unspecified: Secondary | ICD-10-CM

## 2018-08-22 MED ORDER — SERTRALINE HCL 100 MG PO TABS: 100.0000 mg | ORAL_TABLET | Freq: Every day | ORAL | 0 refills | Status: DC

## 2018-08-22 NOTE — Telephone Encounter (Signed)
Is this ok to refill?  Patient has an appointment march 30 with Abby Potash [facility]

## 2018-08-22 NOTE — Telephone Encounter (Signed)
Is this ok to refill?  Patient has an appointment with someone else on August 29, 2018 Anders Grant [facility]?

## 2018-08-23 ENCOUNTER — Encounter (INDEPENDENT_AMBULATORY_CARE_PROVIDER_SITE_OTHER): Payer: Self-pay | Admitting: Physician Assistant

## 2018-08-23 ENCOUNTER — Ambulatory Visit (INDEPENDENT_AMBULATORY_CARE_PROVIDER_SITE_OTHER): Payer: 59 | Admitting: Physician Assistant

## 2018-08-23 ENCOUNTER — Encounter (INDEPENDENT_AMBULATORY_CARE_PROVIDER_SITE_OTHER): Payer: Self-pay

## 2018-08-23 ENCOUNTER — Other Ambulatory Visit: Payer: Self-pay

## 2018-08-23 DIAGNOSIS — Z6836 Body mass index (BMI) 36.0-36.9, adult: Secondary | ICD-10-CM | POA: Diagnosis not present

## 2018-08-23 DIAGNOSIS — E559 Vitamin D deficiency, unspecified: Secondary | ICD-10-CM

## 2018-08-23 MED FILL — SERTRALINE HCL 100 MG TAB: 100 | 90 days supply | Qty: 90 | Fill #0

## 2018-08-24 MED ORDER — NALTREXONE-BUPROPION HCL ER 8-90 MG PO TB12: 2.0000 | ORAL_TABLET | Freq: Two times a day (BID) | ORAL | 0 refills | Status: DC

## 2018-08-24 MED ORDER — VITAMIN D (ERGOCALCIFEROL) 1.25 MG (50000 UNIT) PO CAPS: 50000.0000 [IU] | ORAL_CAPSULE | ORAL | 0 refills | Status: DC

## 2018-08-24 MED FILL — VIT D2 1.25 MG (50,000 UNIT: 1.25 MG | 28 days supply | Qty: 4 | Fill #0

## 2018-08-24 MED FILL — CONTRAVE ER 8-90 MG TABLET: 8-90 | 30 days supply | Qty: 120 | Fill #0

## 2018-08-24 NOTE — Progress Notes (Signed)
Office: [phone]  /  Fax: [phone] TeleHealth Visit:  Michelle Griffin has consented to this TeleHealth visit today via telephone. The patient is located at home, the provider is located at the [employer] and Wellness office. The participants in this visit include the listed provider and patient and provider's assistant. Time spent on visit was 15 minutes  HPI:   Chief Complaint: OBESITY Michelle Griffin is here to discuss her progress with her obesity treatment plan. She is on the keep a food journal with 1500 calories and 95 grams of protein daily and is following her eating plan approximately 80 % of the time. She states she is exercising 0 minutes 0 times per week. Michelle Griffin reports that she has been stress eating and believes that she has gained 3 to 5 pounds. She is having bad cravings. She also is not journaling all of her snacks. She is asking about possibly restarting her Contrave. We were unable to weight the patient today for this TeleHealth visit. She feels as if she has gained 3 to 5 lbs since her last visit. She has lost 0 lbs since starting treatment with [name].  Vitamin D Deficiency Michelle Griffin has a diagnosis of vitamin D deficiency. She is currently taking prescription Vit D and denies nausea, vomiting or muscle weakness.  ASSESSMENT AND PLAN:  Vitamin D deficiency - Plan: Comprehensive metabolic panel, Hemoglobin A1c, Insulin, random, Lipid panel, VITAMIN D 25 Hydroxy (Vit-D Deficiency, Fractures), Vitamin D, Ergocalciferol, (DRISDOL) 1.25 MG (50000 UT) CAPS capsule  Class 2 severe obesity with serious comorbidity and body mass index (BMI) of 36.0 to 36.9 in adult, unspecified obesity type ([place]) - Plan: Naltrexone-buPROPion HCl ER (CONTRAVE) 8-90 MG TB12  PLAN:  Vitamin D Deficiency Michelle Griffin was informed that low vitamin D levels contributes to fatigue and are associated with obesity, breast, and colon cancer. Zia agrees to continue taking prescription Vit D @50 ,000 IU every week #4 and  we will refill for 1 month. She will follow up for routine testing of vitamin D, at least 2-3 times per year. She was informed of the risk of over-replacement of vitamin D and agrees to not increase her dose unless she discusses this with [name] first. Michelle Griffin agrees to follow up with our clinic in 3 weeks.  I spent > than 50% of the 15 minute visit on counseling as documented in the note.  Obesity Michelle Griffin is currently in the action stage of change. As such, her goal is to continue with weight loss efforts She has agreed to keep a food journal with 1500 calories and 95 grams of protein daily Michelle Griffin has been instructed to work up to a goal of 150 minutes of combined cardio and strengthening exercise per week for weight loss and overall health benefits. We discussed the following Behavioral Modification Strategies today: work on meal planning and easy cooking plans and keeping healthy foods in the home We discussed various medication options to help Michelle Griffin with her weight loss efforts and we both agreed to restart Contrave 8-90 mg 2 tablets BID #120 with no refills.  Michelle Griffin has agreed to follow up with our clinic in 3 weeks. She was informed of the importance of frequent follow up visits to maximize her success with intensive lifestyle modifications for her multiple health conditions.  ALLERGIES: Allergies  Allergen Reactions  . Sulfa Antibiotics Hives    MEDICATIONS: Current Outpatient Medications on File Prior to Visit  Medication Sig Dispense Refill  . albuterol (PROVENTIL HFA;VENTOLIN HFA) 108 (90  BASE) MCG/ACT inhaler Inhale 2 puffs into the lungs every 6 (six) hours as needed for wheezing or shortness of breath. 1 Inhaler 0  . azelastine (ASTELIN) 0.1 % nasal spray Place 2 sprays into both nostrils 2 (two) times daily. Use in each nostril as directed 30 mL 0  . EPINEPHrine (EPI-PEN) 0.3 mg/0.3 mL SOAJ injection Inject 0.3 mg into the muscle once.    . famotidine (PEPCID) 40 MG tablet TAKE 1 TABLET  BY MOUTH ONCE DAILY 90 tablet 0  . fluticasone (FLONASE) 50 MCG/ACT nasal spray Place into both nostrils daily.    [name] ibuprofen (ADVIL,MOTRIN) 200 MG tablet Take 600 mg by mouth every 4 (four) hours as needed.    . Lactobacillus (PROBIOTIC ACIDOPHILUS) CAPS Take 1 capsule by mouth daily.    [name] levocetirizine (XYZAL) 5 MG tablet Take 5 mg by mouth every evening.    . montelukast (SINGULAIR) 10 MG tablet Take 10 mg by mouth at bedtime.    . Multiple Vitamins-Minerals (MULTIVITAMIN WITH MINERALS) tablet Take 1 tablet by mouth daily.    . nitroGLYCERIN (NITRODUR - DOSED IN MG/24 HR) 0.2 mg/hr patch PLACE 1 PATCH (0.2 MG TOTAL) ONTO THE SKIN DAILY. 30 patch 11  . sertraline (ZOLOFT) 100 MG tablet Take 1 tablet (100 mg total) by mouth daily. 90 tablet 0  . sertraline (ZOLOFT) 100 MG tablet Take 1 tablet (100 mg total) by mouth daily. 90 tablet 0   No current facility-administered medications on file prior to visit.     PAST MEDICAL HISTORY: Past Medical History:  Diagnosis Date  . Allergy    sees [name]  . Anemia    hx/o iron deficiency due to heavy periods  . Asthma   . Bursitis   . DDD (degenerative disc disease), lumbar   . Fatty liver   . GERD (gastroesophageal reflux disease)    [name]  . H/O mammogram   . Hiatal hernia    small per EGD, [name]  . Hyperlipidemia 2012   was on pravastatin prior  . Migraine    prior eval with neurologist, Dr. Melton Alar  . Overactive bladder   . PMDD (premenstrual dysphoric disorder)   . Pyelonephritis    age 25yo  . Routine gynecological examination    Allison, [name], NP  . Uterine infection 07/2015   s/p novasure ablation  . Vitamin D deficiency   . Wears glasses     PAST SURGICAL HISTORY: Past Surgical History:  Procedure Laterality Date  . CHOLECYSTECTOMY    . COLONOSCOPY  2014   repeat 5 years; [name], baseline  . ESOPHAGOGASTRODUODENOSCOPY  2014   [name]  . FOOT SURGERY Left    morton's neuroma  .  LASIK     [name]  . LUMBAR DISC SURGERY  07/2013   due to herniated disc, [name]  . NOVASURE ABLATION    . WISDOM TOOTH EXTRACTION      SOCIAL HISTORY: Social History   Tobacco Use  . Smoking status: Never Smoker  . Smokeless tobacco: Never Used  Substance Use Topics  . Alcohol use: Yes    Alcohol/week: 3.0 standard drinks    Types: 3 Cans of beer per week    Comment: socially  . Drug use: No    FAMILY HISTORY: Family History  Problem Relation Age of Onset  . Lupus Mother   . Hypertension Mother   . Hyperlipidemia Mother   . Migraines Mother   .  Kidney disease Mother   . Hypertension Father   . Diabetes Father   . Hyperlipidemia Father   . Depression Father   . Anxiety disorder Father   . Dementia Father   . Obesity Sister   . Hypertension Sister   . Diabetes Sister   . Heart disease Paternal Uncle        CABG  . Hyperlipidemia Paternal Uncle   . Cancer Paternal Uncle        multiple with colon cancer  . Cancer Cousin        breast  . Cancer Cousin        ovarian  . Ovarian cysts Daughter   . Stroke Neg Hx     ROS: Review of Systems  Constitutional: Negative for weight loss.  Gastrointestinal: Negative for nausea and vomiting.  Musculoskeletal:       Negative muscle weakness    PHYSICAL EXAM: Pt in no acute distress  RECENT LABS AND TESTS: BMET    Component Value Date/Time   NA 138 03/02/2018 0958   K 4.6 03/02/2018 0958   CL 102 03/02/2018 0958   CO2 24 03/02/2018 0958   GLUCOSE 91 03/02/2018 0958   GLUCOSE 89 03/23/2017 0841   BUN 13 03/02/2018 0958   CREATININE 0.78 03/02/2018 0958   CREATININE 0.81 03/23/2017 0841   CALCIUM 9.5 03/02/2018 0958   GFRNONAA 92 03/02/2018 0958   GFRAA 106 03/02/2018 0958   Lab Results  Component Value Date   HGBA1C 5.7 (H) 03/02/2018   HGBA1C 5.5 11/03/2017   HGBA1C 5.5 03/23/2017   HGBA1C 5.4 02/17/2016   HGBA1C 5.6 02/13/2015   Lab Results  Component Value Date   INSULIN 14.0  03/02/2018   INSULIN 10.7 11/03/2017   CBC    Component Value Date/Time   WBC 5.1 03/02/2018 0958   WBC 5.2 03/23/2017 0841   RBC 5.27 03/02/2018 0958   RBC 4.68 03/23/2017 0841   HGB 13.3 03/02/2018 0958   HCT 41.4 03/02/2018 0958   PLT 259 03/23/2017 0841   MCV 79 03/02/2018 0958   MCH 25.2 (L) 03/02/2018 0958   MCH 27.8 03/23/2017 0841   MCHC 32.1 03/02/2018 0958   MCHC 33.9 03/23/2017 0841   RDW 17.0 (H) 03/02/2018 0958   LYMPHSABS 1.5 03/02/2018 0958   MONOABS 1.5 (H) 07/30/2015 0521   EOSABS 0.0 03/02/2018 0958   BASOSABS 0.1 03/02/2018 0958   Iron/TIBC/Ferritin/ %Sat    Component Value Date/Time   IRON 97 02/13/2015 0001   TIBC 339 02/13/2015 0001   IRONPCTSAT 29 02/13/2015 0001   Lipid Panel     Component Value Date/Time   CHOL 235 (H) 11/03/2017 1153   TRIG 82 11/03/2017 1153   HDL 85 11/03/2017 1153   CHOLHDL 2.7 03/23/2017 0841   VLDL 24 02/17/2016 0728   LDLCALC 134 (H) 11/03/2017 1153   LDLCALC 119 (H) 03/23/2017 0841   Hepatic Function Panel     Component Value Date/Time   PROT 7.3 03/02/2018 0958   ALBUMIN 4.1 03/02/2018 0958   AST 20 03/02/2018 0958   ALT 20 03/02/2018 0958   ALKPHOS 89 03/02/2018 0958   BILITOT 0.2 03/02/2018 0958   BILIDIR 0.1 08/21/2014 0853   IBILI 0.2 08/21/2014 0853      Component Value Date/Time   TSH 1.480 11/03/2017 1153   TSH 2.63 02/17/2016 0728   TSH 2.697 08/09/2013 1031      I, Trixie Dredge, am acting as Location manager for Abby Potash, PA-C  IAbby Potash, PA-C have reviewed above note and agree with its content

## 2018-08-29 ENCOUNTER — Ambulatory Visit (INDEPENDENT_AMBULATORY_CARE_PROVIDER_SITE_OTHER): Payer: 59 | Admitting: Physician Assistant

## 2018-08-29 DIAGNOSIS — J3089 Other allergic rhinitis: Secondary | ICD-10-CM | POA: Diagnosis not present

## 2018-08-29 DIAGNOSIS — J3081 Allergic rhinitis due to animal (cat) (dog) hair and dander: Secondary | ICD-10-CM | POA: Diagnosis not present

## 2018-08-29 DIAGNOSIS — J301 Allergic rhinitis due to pollen: Secondary | ICD-10-CM | POA: Diagnosis not present

## 2018-09-07 DIAGNOSIS — J301 Allergic rhinitis due to pollen: Secondary | ICD-10-CM | POA: Diagnosis not present

## 2018-09-07 DIAGNOSIS — J3081 Allergic rhinitis due to animal (cat) (dog) hair and dander: Secondary | ICD-10-CM | POA: Diagnosis not present

## 2018-09-07 DIAGNOSIS — J3089 Other allergic rhinitis: Secondary | ICD-10-CM | POA: Diagnosis not present

## 2018-09-10 MED FILL — FAMOTIDINE 20 MG TABS: 20 | 30 days supply | Qty: 60 | Fill #1

## 2018-09-13 ENCOUNTER — Encounter (INDEPENDENT_AMBULATORY_CARE_PROVIDER_SITE_OTHER): Payer: Self-pay | Admitting: Physician Assistant

## 2018-09-13 ENCOUNTER — Other Ambulatory Visit: Payer: Self-pay

## 2018-09-13 ENCOUNTER — Ambulatory Visit (INDEPENDENT_AMBULATORY_CARE_PROVIDER_SITE_OTHER): Payer: 59 | Admitting: Physician Assistant

## 2018-09-13 DIAGNOSIS — E559 Vitamin D deficiency, unspecified: Secondary | ICD-10-CM | POA: Diagnosis not present

## 2018-09-13 DIAGNOSIS — Z6836 Body mass index (BMI) 36.0-36.9, adult: Secondary | ICD-10-CM | POA: Diagnosis not present

## 2018-09-13 DIAGNOSIS — R7303 Prediabetes: Secondary | ICD-10-CM

## 2018-09-13 NOTE — Progress Notes (Signed)
Office: [phone]  /  Fax: [phone] TeleHealth Visit:  Michelle Griffin has verbally consented to this TeleHealth visit today. The patient is located at home, the provider is located at the [employer] and [facility] office. The participants in this visit include the listed provider and patient. The visit was conducted today via Webex.  HPI:   Chief Complaint: OBESITY Michelle Griffin is here to discuss her progress with her obesity treatment plan. She is keeping a food journal with 1500 calories and 95 grams of protein daily and is following her eating plan approximately 80% of the time. She states she is exercising 0 minutes 0 times per week. Michelle Griffin reports that her appetite is much better controlled on Contrave. She states she is snacking less and is eating closer to plan. We were unable to weigh the patient today for this TeleHealth visit. She feels as if she has maintained her weight since her last visit. She has lost 0 lbs since starting treatment with [name].  Vitamin D deficiency Michelle Griffin has a diagnosis of Vitamin D deficiency. She is currently taking prescription Vit D and denies nausea, vomiting or muscle weakness.  Pre-Diabetes Michelle Griffin has a diagnosis of prediabetes based on her elevated Hgb A1c and was informed this puts her at greater risk of developing diabetes. She is not taking metformin currently and continues to work on diet and exercise to decrease risk of diabetes. She denies polyphagia.  ASSESSMENT AND PLAN:  Vitamin D deficiency - Plan: Vitamin D, Ergocalciferol, (DRISDOL) 1.25 MG (50000 UT) CAPS capsule  Prediabetes  Class 2 severe obesity with serious comorbidity and body mass index (BMI) of 36.0 to 36.9 in adult, unspecified obesity type (Michelle Griffin)  PLAN:  Vitamin D Deficiency Michelle Griffin was informed that low Vitamin D levels contributes to fatigue and are associated with obesity, breast, and colon cancer. She agrees to continue to take prescription Vit D @ 50,000 IU every week #4 with  0 refills and will follow-up for routine testing of Vitamin D, at least 2-3 times per year. She was informed of the risk of over-replacement of Vitamin D and agrees to not increase her dose unless she discusses this with [name] first. Michelle Griffin agrees to follow-up with our clinic in 2 weeks.  Pre-Diabetes Michelle Griffin will continue to work on weight loss, exercise, and decreasing simple carbohydrates in her diet to help decrease the risk of diabetes. We dicussed metformin including benefits and risks. She was informed that eating too many simple carbohydrates or too many calories at one sitting increases the likelihood of GI side effects. Michelle Griffin is not taking metformin and a prescription was not written today. Michelle Griffin agreed to follow-up with [name] as directed to monitor her progress.  Obesity Michelle Griffin is currently in the action stage of change. As such, her goal is to continue with weight loss efforts. She has agreed to keep a food journal with 1500 calories and 95 grams of protein daily. Michelle Griffin has been instructed to work up to a goal of 150 minutes of combined cardio and strengthening exercise per week for weight loss and overall health benefits. We discussed the following Behavioral Modification Strategies today: work on meal planning and easy cooking plans.  Michelle Griffin has agreed to follow-up with our clinic in 2 weeks. She was informed of the importance of frequent follow-up visits to maximize her success with intensive lifestyle modifications for her multiple health conditions.  ALLERGIES: Allergies  Allergen Reactions  . Sulfa Antibiotics Hives    MEDICATIONS: Current Outpatient Medications  on File Prior to Visit  Medication Sig Dispense Refill  . albuterol (PROVENTIL HFA;VENTOLIN HFA) 108 (90 BASE) MCG/ACT inhaler Inhale 2 puffs into the lungs every 6 (six) hours as needed for wheezing or shortness of breath. 1 Inhaler 0  . azelastine (ASTELIN) 0.1 % nasal spray Place 2 sprays into both nostrils 2 (two) times  daily. Use in each nostril as directed 30 mL 0  . EPINEPHrine (EPI-PEN) 0.3 mg/0.3 mL SOAJ injection Inject 0.3 mg into the muscle once.    . famotidine (PEPCID) 40 MG tablet TAKE 1 TABLET BY MOUTH ONCE DAILY 90 tablet 0  . fluticasone (FLONASE) 50 MCG/ACT nasal spray Place into both nostrils daily.    Michelle Griffin Kitchen ibuprofen (ADVIL,MOTRIN) 200 MG tablet Take 600 mg by mouth every 4 (four) hours as needed.    . Lactobacillus (PROBIOTIC ACIDOPHILUS) CAPS Take 1 capsule by mouth daily.    Michelle Griffin Kitchen levocetirizine (XYZAL) 5 MG tablet Take 5 mg by mouth every evening.    . montelukast (SINGULAIR) 10 MG tablet Take 10 mg by mouth at bedtime.    . Multiple Vitamins-Minerals (MULTIVITAMIN WITH MINERALS) tablet Take 1 tablet by mouth daily.    . Naltrexone-buPROPion HCl ER (CONTRAVE) 8-90 MG TB12 Take 2 tablets by mouth 2 (two) times daily. 120 tablet 0  . nitroGLYCERIN (NITRODUR - DOSED IN MG/24 HR) 0.2 mg/hr patch PLACE 1 PATCH (0.2 MG TOTAL) ONTO THE SKIN DAILY. 30 patch 11  . sertraline (ZOLOFT) 100 MG tablet Take 1 tablet (100 mg total) by mouth daily. 90 tablet 0  . sertraline (ZOLOFT) 100 MG tablet Take 1 tablet (100 mg total) by mouth daily. 90 tablet 0  . Vitamin D, Ergocalciferol, (DRISDOL) 1.25 MG (50000 UT) CAPS capsule Take 1 capsule (50,000 Units total) by mouth every 7 (seven) days. 4 capsule 0   No current facility-administered medications on file prior to visit.     PAST MEDICAL HISTORY: Past Medical History:  Diagnosis Date  . Allergy    sees Michelle Griffin  . Anemia    hx/o iron deficiency due to heavy periods  . Asthma   . Bursitis   . DDD (degenerative disc disease), lumbar   . Fatty liver   . GERD (gastroesophageal reflux disease)    Michelle Griffin  . H/O mammogram   . Hiatal hernia    small per EGD, Michelle Griffin  . Hyperlipidemia 2012   was on pravastatin prior  . Migraine    prior eval with neurologist, Michelle Griffin  . Overactive bladder   . PMDD (premenstrual dysphoric disorder)   .  Pyelonephritis    age 27yo  . Routine gynecological examination    Michelle Griffin, Michelle Rua, Michelle Griffin  . Uterine infection 07/2015   s/p novasure ablation  . Vitamin D deficiency   . Wears glasses     PAST SURGICAL HISTORY: Past Surgical History:  Procedure Laterality Date  . CHOLECYSTECTOMY    . COLONOSCOPY  2014   repeat 5 years; Michelle Griffin, baseline  . ESOPHAGOGASTRODUODENOSCOPY  2014   Michelle Griffin  . FOOT SURGERY Left    morton's neuroma  . LASIK     Michelle Griffin  . LUMBAR DISC SURGERY  07/2013   due to herniated disc, Michelle Griffin  . NOVASURE ABLATION    . WISDOM TOOTH EXTRACTION      SOCIAL HISTORY: Social History   Tobacco Use  . Smoking status: Never Smoker  . Smokeless tobacco: Never Used  Substance Use Topics  .  Alcohol use: Yes    Alcohol/week: 3.0 standard drinks    Types: 3 Cans of beer per week    Comment: socially  . Drug use: No    FAMILY HISTORY: Family History  Problem Relation Age of Onset  . Lupus Mother   . Hypertension Mother   . Hyperlipidemia Mother   . Migraines Mother   . Kidney disease Mother   . Hypertension Father   . Diabetes Father   . Hyperlipidemia Father   . Depression Father   . Anxiety disorder Father   . Dementia Father   . Obesity Sister   . Hypertension Sister   . Diabetes Sister   . Heart disease Paternal Uncle        CABG  . Hyperlipidemia Paternal Uncle   . Cancer Paternal Uncle        multiple with colon cancer  . Cancer Cousin        breast  . Cancer Cousin        ovarian  . Ovarian cysts Daughter   . Stroke Neg Hx    ROS: Review of Systems  Gastrointestinal: Negative for nausea and vomiting.  Musculoskeletal:       Negative for muscle weakness.  Endo/Heme/Allergies:       Negative for polyphagia.   PHYSICAL EXAM: Pt in no acute distress  RECENT LABS AND TESTS: BMET    Component Value Date/Time   NA 138 03/02/2018 0958   K 4.6 03/02/2018 0958   CL 102 03/02/2018 0958   CO2 24 03/02/2018  0958   GLUCOSE 91 03/02/2018 0958   GLUCOSE 89 03/23/2017 0841   BUN 13 03/02/2018 0958   CREATININE 0.78 03/02/2018 0958   CREATININE 0.81 03/23/2017 0841   CALCIUM 9.5 03/02/2018 0958   GFRNONAA 92 03/02/2018 0958   GFRAA 106 03/02/2018 0958   Lab Results  Component Value Date   HGBA1C 5.7 (H) 03/02/2018   HGBA1C 5.5 11/03/2017   HGBA1C 5.5 03/23/2017   HGBA1C 5.4 02/17/2016   HGBA1C 5.6 02/13/2015   Lab Results  Component Value Date   INSULIN 14.0 03/02/2018   INSULIN 10.7 11/03/2017   CBC    Component Value Date/Time   WBC 5.1 03/02/2018 0958   WBC 5.2 03/23/2017 0841   RBC 5.27 03/02/2018 0958   RBC 4.68 03/23/2017 0841   HGB 13.3 03/02/2018 0958   HCT 41.4 03/02/2018 0958   PLT 259 03/23/2017 0841   MCV 79 03/02/2018 0958   MCH 25.2 (L) 03/02/2018 0958   MCH 27.8 03/23/2017 0841   MCHC 32.1 03/02/2018 0958   MCHC 33.9 03/23/2017 0841   RDW 17.0 (H) 03/02/2018 0958   LYMPHSABS 1.5 03/02/2018 0958   MONOABS 1.5 (H) 07/30/2015 0521   EOSABS 0.0 03/02/2018 0958   BASOSABS 0.1 03/02/2018 0958   Iron/TIBC/Ferritin/ %Sat    Component Value Date/Time   IRON 97 02/13/2015 0001   TIBC 339 02/13/2015 0001   IRONPCTSAT 29 02/13/2015 0001   Lipid Panel     Component Value Date/Time   CHOL 235 (H) 11/03/2017 1153   TRIG 82 11/03/2017 1153   HDL 85 11/03/2017 1153   CHOLHDL 2.7 03/23/2017 0841   VLDL 24 02/17/2016 0728   LDLCALC 134 (H) 11/03/2017 1153   LDLCALC 119 (H) 03/23/2017 0841   Hepatic Function Panel     Component Value Date/Time   PROT 7.3 03/02/2018 0958   ALBUMIN 4.1 03/02/2018 0958   AST 20 03/02/2018 0958   ALT  20 03/02/2018 0958   ALKPHOS 89 03/02/2018 0958   BILITOT 0.2 03/02/2018 0958   BILIDIR 0.1 08/21/2014 0853   IBILI 0.2 08/21/2014 0853      Component Value Date/Time   TSH 1.480 11/03/2017 1153   TSH 2.63 02/17/2016 0728   TSH 2.697 08/09/2013 1031   Results for TALICIA, SUI (MRN [id number]) as of 09/13/2018 17:27  Ref.  Range 03/02/2018 09:58  Vitamin D, 25-Hydroxy Latest Ref Range: 30.0 - 100.0 ng/mL 38.9   I, Michaelene Song, am acting as Location manager for [employer], PA-C I, Abby Potash, PA-C have reviewed above note and agree with its content

## 2018-09-14 MED ORDER — VITAMIN D (ERGOCALCIFEROL) 1.25 MG (50000 UNIT) PO CAPS: 50000.0000 [IU] | ORAL_CAPSULE | ORAL | 0 refills | Status: DC

## 2018-09-14 MED FILL — VIT D2 1.25 MG (50,000 UNIT: 1.25 MG | 28 days supply | Qty: 4 | Fill #0

## 2018-09-16 DIAGNOSIS — J301 Allergic rhinitis due to pollen: Secondary | ICD-10-CM | POA: Diagnosis not present

## 2018-09-16 DIAGNOSIS — J3089 Other allergic rhinitis: Secondary | ICD-10-CM | POA: Diagnosis not present

## 2018-09-16 DIAGNOSIS — J3081 Allergic rhinitis due to animal (cat) (dog) hair and dander: Secondary | ICD-10-CM | POA: Diagnosis not present

## 2018-09-23 DIAGNOSIS — J3089 Other allergic rhinitis: Secondary | ICD-10-CM | POA: Diagnosis not present

## 2018-09-23 DIAGNOSIS — J301 Allergic rhinitis due to pollen: Secondary | ICD-10-CM | POA: Diagnosis not present

## 2018-09-23 DIAGNOSIS — J3081 Allergic rhinitis due to animal (cat) (dog) hair and dander: Secondary | ICD-10-CM | POA: Diagnosis not present

## 2018-09-28 ENCOUNTER — Other Ambulatory Visit: Payer: Self-pay

## 2018-09-28 ENCOUNTER — Encounter (INDEPENDENT_AMBULATORY_CARE_PROVIDER_SITE_OTHER): Payer: Self-pay | Admitting: Physician Assistant

## 2018-09-28 ENCOUNTER — Ambulatory Visit (INDEPENDENT_AMBULATORY_CARE_PROVIDER_SITE_OTHER): Payer: 59 | Admitting: Physician Assistant

## 2018-09-28 DIAGNOSIS — Z6836 Body mass index (BMI) 36.0-36.9, adult: Secondary | ICD-10-CM | POA: Diagnosis not present

## 2018-09-28 DIAGNOSIS — R7303 Prediabetes: Secondary | ICD-10-CM

## 2018-09-28 DIAGNOSIS — E559 Vitamin D deficiency, unspecified: Secondary | ICD-10-CM | POA: Diagnosis not present

## 2018-09-28 DIAGNOSIS — E7849 Other hyperlipidemia: Secondary | ICD-10-CM

## 2018-09-29 DIAGNOSIS — E559 Vitamin D deficiency, unspecified: Secondary | ICD-10-CM | POA: Diagnosis not present

## 2018-09-29 DIAGNOSIS — E7849 Other hyperlipidemia: Secondary | ICD-10-CM | POA: Diagnosis not present

## 2018-09-29 DIAGNOSIS — R7303 Prediabetes: Secondary | ICD-10-CM | POA: Diagnosis not present

## 2018-09-29 NOTE — Progress Notes (Signed)
Office: [phone]  /  Fax: [phone] TeleHealth Visit:  Michelle Griffin has verbally consented to this TeleHealth visit today. The patient is located at home, the provider is located at the [facility] office. The participants in this visit include the listed provider and patient. The visit was conducted today via Webex.  HPI:   Chief Complaint: OBESITY [name] is here to discuss her progress with her obesity treatment plan. She is keeping a food journal with 1500 calories and 95 grams of protein daily and is following her eating plan approximately 20% of the time. She states she is walking 30-45 minutes 3-4 times per week. [name] reports that she has been following the plan more closely over the past couple of weeks. She is having some cravings in the afternoon. We were unable to weigh the patient today for this TeleHealth visit. She feels as if she has maintained her weight since her last visit. She has lost 0 lbs since starting treatment with [name].  Vitamin D deficiency Annesha has a diagnosis of Vitamin D deficiency. She is currently taking Vit D and denies nausea, vomiting or muscle weakness.  Pre-Diabetes Aysia has a diagnosis of prediabetes based on her elevated Hgb A1c and was informed this puts her at greater risk of developing diabetes. She is not on any medication and continues to work on diet and exercise to decrease risk of diabetes. No polyphagia.  Hyperlipidemia Moon has hyperlipidemia and has been trying to improve her cholesterol levels with intensive lifestyle modification including a low saturated fat diet, exercise and weight loss. She is on no medication and denies any chest pain.  ASSESSMENT AND PLAN:  Vitamin D deficiency - Plan: VITAMIN D 25 Hydroxy (Vit-D Deficiency, Fractures)  Prediabetes - Plan: Comprehensive metabolic panel, Hemoglobin A1c, Insulin, random  Other hyperlipidemia - Plan: Lipid Panel With LDL/HDL Ratio  Class 2 severe obesity with  serious comorbidity and body mass index (BMI) of 36.0 to 36.9 in adult, unspecified obesity type ([place])  PLAN:  Vitamin D Deficiency Bexlee was informed that low Vitamin D levels contributes to fatigue and are associated with obesity, breast, and colon cancer. She agrees to continue taking Vit D and will follow-up for routine testing of Vitamin D, at least 2-3 times per year. She was informed of the risk of over-replacement of Vitamin D and agrees to not increase her dose unless she discusses this with [name] first. [name] agrees to follow-up with our clinic in 2 weeks.  Pre-Diabetes [name] will continue to work on weight loss, exercise, and decreasing simple carbohydrates in her diet to help decrease the risk of diabetes. We dicussed metformin including benefits and risks. She was informed that eating too many simple carbohydrates or too many calories at one sitting increases the likelihood of GI side effects. [name] will continue with weight loss and will have labs checked. She agrees to follow-up with [name] as directed to monitor her progress.  Hyperlipidemia [name] was informed of the American Heart Association Guidelines emphasizing intensive lifestyle modifications as the first line treatment for hyperlipidemia. We discussed many lifestyle modifications today in depth, and [name] will continue to work on decreasing saturated fats such as fatty red meat, butter and many fried foods. She will continue with weight loss and will have labs checked. She will also increase vegetables and lean protein in her diet and continue to work on exercise and weight loss efforts.  Obesity [name] is currently in the action stage of change. As  such, her goal is to continue with weight loss efforts. She has agreed to follow the Category 3 plan or journal 1500 calories + 95 grams of protein daily. [name] has been instructed to work up to a goal of 150 minutes of combined cardio and strengthening exercise per week for weight loss  and overall health benefits. We discussed the following Behavioral Modification Strategies today: work on meal planning, easy cooking plans, and keeping healthy foods in the home.  [name] has agreed to follow-up with our clinic in 3 weeks. She was informed of the importance of frequent follow-up visits to maximize her success with intensive lifestyle modifications for her multiple health conditions.  ALLERGIES: Allergies  Allergen Reactions  . Sulfa Antibiotics Hives    MEDICATIONS: Current Outpatient Medications on File Prior to Visit  Medication Sig Dispense Refill  . albuterol (PROVENTIL HFA;VENTOLIN HFA) 108 (90 BASE) MCG/ACT inhaler Inhale 2 puffs into the lungs every 6 (six) hours as needed for wheezing or shortness of breath. 1 Inhaler 0  . azelastine (ASTELIN) 0.1 % nasal spray Place 2 sprays into both nostrils 2 (two) times daily. Use in each nostril as directed 30 mL 0  . EPINEPHrine (EPI-PEN) 0.3 mg/0.3 mL SOAJ injection Inject 0.3 mg into the muscle once.    . famotidine (PEPCID) 40 MG tablet TAKE 1 TABLET BY MOUTH ONCE DAILY 90 tablet 0  . fluticasone (FLONASE) 50 MCG/ACT nasal spray Place into both nostrils daily.    [name] ibuprofen (ADVIL,MOTRIN) 200 MG tablet Take 600 mg by mouth every 4 (four) hours as needed.    . Lactobacillus (PROBIOTIC ACIDOPHILUS) CAPS Take 1 capsule by mouth daily.    [name] levocetirizine (XYZAL) 5 MG tablet Take 5 mg by mouth every evening.    . montelukast (SINGULAIR) 10 MG tablet Take 10 mg by mouth at bedtime.    . Multiple Vitamins-Minerals (MULTIVITAMIN WITH MINERALS) tablet Take 1 tablet by mouth daily.    . Naltrexone-buPROPion HCl ER (CONTRAVE) 8-90 MG TB12 Take 2 tablets by mouth 2 (two) times daily. 120 tablet 0  . nitroGLYCERIN (NITRODUR - DOSED IN MG/24 HR) 0.2 mg/hr patch PLACE 1 PATCH (0.2 MG TOTAL) ONTO THE SKIN DAILY. 30 patch 11  . sertraline (ZOLOFT) 100 MG tablet Take 1 tablet (100 mg total) by mouth daily. 90 tablet 0  . sertraline  (ZOLOFT) 100 MG tablet Take 1 tablet (100 mg total) by mouth daily. 90 tablet 0  . Vitamin D, Ergocalciferol, (DRISDOL) 1.25 MG (50000 UT) CAPS capsule Take 1 capsule (50,000 Units total) by mouth every 7 (seven) days. 4 capsule 0   No current facility-administered medications on file prior to visit.     PAST MEDICAL HISTORY: Past Medical History:  Diagnosis Date  . Allergy    sees [name]  . Anemia    hx/o iron deficiency due to heavy periods  . Asthma   . Bursitis   . DDD (degenerative disc disease), lumbar   . Fatty liver   . GERD (gastroesophageal reflux disease)    [name]  . H/O mammogram   . Hiatal hernia    small per EGD, [name]  . Hyperlipidemia 2012   was on pravastatin prior  . Migraine    prior eval with neurologist, Dr. Melton Alar  . Overactive bladder   . PMDD (premenstrual dysphoric disorder)   . Pyelonephritis    age 48yo  . Routine gynecological examination    Gamaliel, [name], NP  . Uterine infection  07/2015   s/p novasure ablation  . Vitamin D deficiency   . Wears glasses     PAST SURGICAL HISTORY: Past Surgical History:  Procedure Laterality Date  . CHOLECYSTECTOMY    . COLONOSCOPY  2014   repeat 5 years; [name], baseline  . ESOPHAGOGASTRODUODENOSCOPY  2014   [name]  . FOOT SURGERY Left    morton's neuroma  . LASIK     [name]  . LUMBAR DISC SURGERY  07/2013   due to herniated disc, [name]  . NOVASURE ABLATION    . WISDOM TOOTH EXTRACTION      SOCIAL HISTORY: Social History   Tobacco Use  . Smoking status: Never Smoker  . Smokeless tobacco: Never Used  Substance Use Topics  . Alcohol use: Yes    Alcohol/week: 3.0 standard drinks    Types: 3 Cans of beer per week    Comment: socially  . Drug use: No    FAMILY HISTORY: Family History  Problem Relation Age of Onset  . Lupus Mother   . Hypertension Mother   . Hyperlipidemia Mother   . Migraines Mother   . Kidney disease Mother   .  Hypertension Father   . Diabetes Father   . Hyperlipidemia Father   . Depression Father   . Anxiety disorder Father   . Dementia Father   . Obesity Sister   . Hypertension Sister   . Diabetes Sister   . Heart disease Paternal Uncle        CABG  . Hyperlipidemia Paternal Uncle   . Cancer Paternal Uncle        multiple with colon cancer  . Cancer Cousin        breast  . Cancer Cousin        ovarian  . Ovarian cysts Daughter   . Stroke Neg Hx    ROS: Review of Systems  Cardiovascular: Negative for chest pain.  Gastrointestinal: Negative for nausea and vomiting.  Musculoskeletal:       Negative for muscle weakness.  Endo/Heme/Allergies:       Negative for polyphagia.   PHYSICAL EXAM: Pt in no acute distress  RECENT LABS AND TESTS: BMET    Component Value Date/Time   NA 138 03/02/2018 0958   K 4.6 03/02/2018 0958   CL 102 03/02/2018 0958   CO2 24 03/02/2018 0958   GLUCOSE 91 03/02/2018 0958   GLUCOSE 89 03/23/2017 0841   BUN 13 03/02/2018 0958   CREATININE 0.78 03/02/2018 0958   CREATININE 0.81 03/23/2017 0841   CALCIUM 9.5 03/02/2018 0958   GFRNONAA 92 03/02/2018 0958   GFRAA 106 03/02/2018 0958   Lab Results  Component Value Date   HGBA1C 5.7 (H) 03/02/2018   HGBA1C 5.5 11/03/2017   HGBA1C 5.5 03/23/2017   HGBA1C 5.4 02/17/2016   HGBA1C 5.6 02/13/2015   Lab Results  Component Value Date   INSULIN 14.0 03/02/2018   INSULIN 10.7 11/03/2017   CBC    Component Value Date/Time   WBC 5.1 03/02/2018 0958   WBC 5.2 03/23/2017 0841   RBC 5.27 03/02/2018 0958   RBC 4.68 03/23/2017 0841   HGB 13.3 03/02/2018 0958   HCT 41.4 03/02/2018 0958   PLT 259 03/23/2017 0841   MCV 79 03/02/2018 0958   MCH 25.2 (L) 03/02/2018 0958   MCH 27.8 03/23/2017 0841   MCHC 32.1 03/02/2018 0958   MCHC 33.9 03/23/2017 0841   RDW 17.0 (H) 03/02/2018 0958   LYMPHSABS  1.5 03/02/2018 0958   MONOABS 1.5 (H) 07/30/2015 0521   EOSABS 0.0 03/02/2018 0958   BASOSABS 0.1  03/02/2018 0958   Iron/TIBC/Ferritin/ %Sat    Component Value Date/Time   IRON 97 02/13/2015 0001   TIBC 339 02/13/2015 0001   IRONPCTSAT 29 02/13/2015 0001   Lipid Panel     Component Value Date/Time   CHOL 235 (H) 11/03/2017 1153   TRIG 82 11/03/2017 1153   HDL 85 11/03/2017 1153   CHOLHDL 2.7 03/23/2017 0841   VLDL 24 02/17/2016 0728   LDLCALC 134 (H) 11/03/2017 1153   LDLCALC 119 (H) 03/23/2017 0841   Hepatic Function Panel     Component Value Date/Time   PROT 7.3 03/02/2018 0958   ALBUMIN 4.1 03/02/2018 0958   AST 20 03/02/2018 0958   ALT 20 03/02/2018 0958   ALKPHOS 89 03/02/2018 0958   BILITOT 0.2 03/02/2018 0958   BILIDIR 0.1 08/21/2014 0853   IBILI 0.2 08/21/2014 0853      Component Value Date/Time   TSH 1.480 11/03/2017 1153   TSH 2.63 02/17/2016 0728   TSH 2.697 08/09/2013 1031   Results for Achord, YARNELL KOZLOSKI (MRN [id number]) as of 09/29/2018 10:47  Ref. Range 03/02/2018 09:58  Vitamin D, 25-Hydroxy Latest Ref Range: 30.0 - 100.0 ng/mL 38.9   I, Michaelene Song, am acting as Location manager for [employer], PA-C I, Abby Potash, PA-C have reviewed above note and agree with its content

## 2018-09-30 DIAGNOSIS — J3081 Allergic rhinitis due to animal (cat) (dog) hair and dander: Secondary | ICD-10-CM | POA: Diagnosis not present

## 2018-09-30 DIAGNOSIS — J301 Allergic rhinitis due to pollen: Secondary | ICD-10-CM | POA: Diagnosis not present

## 2018-09-30 DIAGNOSIS — J3089 Other allergic rhinitis: Secondary | ICD-10-CM | POA: Diagnosis not present

## 2018-09-30 LAB — COMPREHENSIVE METABOLIC PANEL
ALT: 24 IU/L (ref 0–32)
AST: 21 IU/L (ref 0–40)
Albumin/Globulin Ratio: 1.7 (ref 1.2–2.2)
Albumin: 4.3 g/dL (ref 3.8–4.8)
Alkaline Phosphatase: 89 IU/L (ref 39–117)
BUN/Creatinine Ratio: 15 (ref 9–23)
BUN: 12 mg/dL (ref 6–24)
Bilirubin Total: 0.3 mg/dL (ref 0.0–1.2)
CO2: 20 mmol/L (ref 20–29)
Calcium: 9.2 mg/dL (ref 8.7–10.2)
Chloride: 100 mmol/L (ref 96–106)
Creatinine, Ser: 0.8 mg/dL (ref 0.57–1.00)
GFR calc Af Amer: 102 mL/min/{1.73_m2} (ref >59)
GFR calc non Af Amer: 89 mL/min/{1.73_m2} (ref >59)
Globulin, Total: 2.6 g/dL (ref 1.5–4.5)
Glucose: 85 mg/dL (ref 65–99)
Potassium: 4.1 mmol/L (ref 3.5–5.2)
Sodium: 137 mmol/L (ref 134–144)
Total Protein: 6.9 g/dL (ref 6.0–8.5)

## 2018-09-30 LAB — HEMOGLOBIN A1C
Est. average glucose Bld gHb Est-mCnc: 105 mg/dL
Hgb A1c MFr Bld: 5.3 % (ref 4.8–5.6)

## 2018-09-30 LAB — LIPID PANEL WITH LDL/HDL RATIO
Cholesterol, Total: 238 mg/dL — ABNORMAL HIGH (ref 100–199)
HDL: 91 mg/dL (ref >39)
LDL Calculated: 126 mg/dL — ABNORMAL HIGH (ref 0–99)
LDl/HDL Ratio: 1.4 ratio (ref 0.0–3.2)
Triglycerides: 107 mg/dL (ref 0–149)
VLDL Cholesterol Cal: 21 mg/dL (ref 5–40)

## 2018-09-30 LAB — INSULIN, RANDOM: INSULIN: 12 u[IU]/mL (ref 2.6–24.9)

## 2018-09-30 LAB — VITAMIN D 25 HYDROXY (VIT D DEFICIENCY, FRACTURES): Vit D, 25-Hydroxy: 34.8 ng/mL (ref 30.0–100.0)

## 2018-10-03 DIAGNOSIS — J301 Allergic rhinitis due to pollen: Secondary | ICD-10-CM | POA: Diagnosis not present

## 2018-10-07 DIAGNOSIS — J3089 Other allergic rhinitis: Secondary | ICD-10-CM | POA: Diagnosis not present

## 2018-10-07 DIAGNOSIS — J3081 Allergic rhinitis due to animal (cat) (dog) hair and dander: Secondary | ICD-10-CM | POA: Diagnosis not present

## 2018-10-07 DIAGNOSIS — J301 Allergic rhinitis due to pollen: Secondary | ICD-10-CM | POA: Diagnosis not present

## 2018-10-13 ENCOUNTER — Encounter: Payer: Self-pay | Admitting: Orthopedic Surgery

## 2018-10-13 ENCOUNTER — Other Ambulatory Visit: Payer: Self-pay

## 2018-10-13 ENCOUNTER — Ambulatory Visit: Payer: 59 | Admitting: Orthopedic Surgery

## 2018-10-13 VITALS — Ht 66.0 in | Wt 225.0 lb

## 2018-10-13 DIAGNOSIS — J3081 Allergic rhinitis due to animal (cat) (dog) hair and dander: Secondary | ICD-10-CM | POA: Diagnosis not present

## 2018-10-13 DIAGNOSIS — J301 Allergic rhinitis due to pollen: Secondary | ICD-10-CM | POA: Diagnosis not present

## 2018-10-13 DIAGNOSIS — L03032 Cellulitis of left toe: Secondary | ICD-10-CM

## 2018-10-13 DIAGNOSIS — J3089 Other allergic rhinitis: Secondary | ICD-10-CM | POA: Diagnosis not present

## 2018-10-14 MED FILL — VIT D2 1.25 MG (50,000 UNIT: 1.25 MG | 28 days supply | Qty: 4 | Fill #0

## 2018-10-17 ENCOUNTER — Other Ambulatory Visit: Payer: Self-pay

## 2018-10-17 ENCOUNTER — Encounter: Payer: Self-pay | Admitting: Orthopedic Surgery

## 2018-10-17 ENCOUNTER — Ambulatory Visit (INDEPENDENT_AMBULATORY_CARE_PROVIDER_SITE_OTHER): Payer: 59 | Admitting: Physician Assistant

## 2018-10-17 ENCOUNTER — Encounter (INDEPENDENT_AMBULATORY_CARE_PROVIDER_SITE_OTHER): Payer: Self-pay | Admitting: Physician Assistant

## 2018-10-17 DIAGNOSIS — Z6836 Body mass index (BMI) 36.0-36.9, adult: Secondary | ICD-10-CM

## 2018-10-17 DIAGNOSIS — E559 Vitamin D deficiency, unspecified: Secondary | ICD-10-CM

## 2018-10-17 NOTE — Progress Notes (Signed)
Office Visit Note   Patient: Michelle Griffin           Date of Birth: June 05, 1970           MRN: [id number] Visit Date: 10/13/2018              Requested by: Carlena Hurl, PA-C [address] [place], [place] [address] PCP: Carlena Hurl, PA-C  Chief Complaint  Patient presents with  . Left Foot - Follow-up, Pain      HPI: Patient is a 48 year old woman who presents for evaluation of left great toenail she has had episodes of paronychial infections and is concerned that she may have developed a new infection.  Assessment & Plan: Visit Diagnoses:  1. Paronychia of great toe, left     Plan: Examination there is no infection at this time discussed the possibility of a permanent nail ablation patient will call us as needed.  Follow-Up Instructions: Return if symptoms worsen or fail to improve.   Ortho Exam  Patient is alert, oriented, no adenopathy, well-dressed, normal affect, normal respiratory effort. Examination patient has good pulses.  She does have an ingrown left great toenail there is no redness no cellulitis there is no tenderness to palpation she does have fungal changes to her nail.  Imaging: No results found. No images are attached to the encounter.  Labs: Lab Results  Component Value Date   HGBA1C 5.3 09/29/2018   HGBA1C 5.7 (H) 03/02/2018   HGBA1C 5.5 11/03/2017   REPTSTATUS 08/01/2015 FINAL 07/27/2015   CULT  07/27/2015    NO GROWTH 5 DAYS Performed at [facility] NO GROWTH 02/17/2016     Lab Results  Component Value Date   ALBUMIN 4.3 09/29/2018   ALBUMIN 4.1 03/02/2018   ALBUMIN 4.4 11/03/2017    Body mass index is 36.32 kg/m.  Orders:  No orders of the defined types were placed in this encounter.  No orders of the defined types were placed in this encounter.    Procedures: No procedures performed  Clinical Data: No additional findings.  ROS:  All other systems negative, except as noted in the HPI.  Review of Systems  Objective: Vital Signs: Ht 5\' 6"  (1.676 m)   Wt 225 lb (102.1 kg)   BMI 36.32 kg/m   Specialty Comments:  No specialty comments available.  PMFS History: Patient Active Problem List   Diagnosis Date Noted  . Plantar fasciitis, left 09/02/2016  . Need for Td vaccine 02/17/2016  . Obesity with serious comorbidity 02/17/2016  . Cervical dysplasia 07/29/2015  . Menorrhagia 07/29/2015  . Uterine fibroid 07/29/2015  . Fatty liver 02/13/2015  . Vitamin D deficiency 02/13/2015  . PMDD (premenstrual dysphoric disorder) 02/13/2015  . Hyperlipidemia 02/13/2015  . OAB (overactive bladder) 02/13/2015  . Gastroesophageal reflux disease without esophagitis 02/13/2015  . Rhinitis, allergic 02/13/2015  . Asthma, mild persistent 02/13/2015  . Routine general medical examination at a health care facility 02/13/2015  . Impaired fasting blood sugar 02/13/2015  . DEGENERATIVE DISC DISEASE, LUMBAR SPINE 11/13/2008   Past Medical History:  Diagnosis Date  . Allergy    sees [name]  . Anemia    hx/o iron deficiency due to heavy periods  . Asthma   . Bursitis   . DDD (degenerative disc disease), lumbar   . Fatty liver   . GERD (gastroesophageal reflux disease)    [name]  . H/O mammogram   . Hiatal hernia  small per EGD, [name]  . Hyperlipidemia 2012   was on pravastatin prior  . Migraine    prior eval with neurologist, Dr. Melton Alar  . Overactive bladder   . PMDD (premenstrual dysphoric disorder)   . Pyelonephritis    age 48yo  . Routine gynecological examination    [place], [name], NP  . Uterine infection 07/2015   s/p novasure ablation  . Vitamin D deficiency   . Wears glasses     Family History  Problem Relation Age of Onset  . Lupus Mother   . Hypertension Mother   . Hyperlipidemia Mother   . Migraines Mother   . Kidney disease Mother   . Hypertension Father   . Diabetes Father   . Hyperlipidemia Father   . Depression  Father   . Anxiety disorder Father   . Dementia Father   . Obesity Sister   . Hypertension Sister   . Diabetes Sister   . Heart disease Paternal Uncle        CABG  . Hyperlipidemia Paternal Uncle   . Cancer Paternal Uncle        multiple with colon cancer  . Cancer Cousin        breast  . Cancer Cousin        ovarian  . Ovarian cysts Daughter   . Stroke Neg Hx     Past Surgical History:  Procedure Laterality Date  . CHOLECYSTECTOMY    . COLONOSCOPY  2014   repeat 5 years; [name], baseline  . ESOPHAGOGASTRODUODENOSCOPY  2014   [name]  . FOOT SURGERY Left    morton's neuroma  . LASIK     [name]  . LUMBAR DISC SURGERY  07/2013   due to herniated disc, [name]  . NOVASURE ABLATION    . WISDOM TOOTH EXTRACTION     Social History   Occupational History  . Not on file  Tobacco Use  . Smoking status: Never Smoker  . Smokeless tobacco: Never Used  Substance and Sexual Activity  . Alcohol use: Yes    Alcohol/week: 3.0 standard drinks    Types: 3 Cans of beer per week    Comment: socially  . Drug use: No  . Sexual activity: Not on file    Comment: partner had vasectomy

## 2018-10-18 NOTE — Progress Notes (Signed)
Office: [phone]  /  Fax: [phone] TeleHealth Visit:  Michelle Griffin has verbally consented to this TeleHealth visit today. The patient is located at home, the provider is located at the [facility] office. The participants in this visit include the listed provider and patient. The visit was conducted today via Skype.  HPI:   Chief Complaint: OBESITY Michelle Griffin is here to discuss her progress with her obesity treatment plan. She is on the Category 3 plan or journaling 1500 calories + 95 grams of protein daily and is following her eating plan approximately 85% of the time. She states she is walking 30 minutes 2 times per week. Michelle Griffin reports that she is doing better on the plan. She is enjoying journaling and would like to continue. She notes a decrease in her cravings. We were unable to weigh the patient today for this TeleHealth visit. She feels as if she has lost 1-2 lbs since her last visit. She has lost 0 lbs since starting treatment with [name].  Vitamin D deficiency Michelle Griffin has a diagnosis of Vitamin D deficiency. She is currently taking Vit D and denies nausea, vomiting or muscle weakness.  ASSESSMENT AND PLAN:  Vitamin D deficiency  Class 2 severe obesity with serious comorbidity and body mass index (BMI) of 36.0 to 36.9 in adult, unspecified obesity type ([place])  PLAN:  Vitamin D Deficiency Michelle Griffin was informed that low Vitamin D levels contributes to fatigue and are associated with obesity, breast, and colon cancer. She agrees to continue taking Vit D and will follow-up for routine testing of Vitamin D, at least 2-3 times per year. She was informed of the risk of over-replacement of Vitamin D and agrees to not increase her dose unless she discusses this with [name] first. Michelle Griffin agrees to follow-up with our clinic in 2 weeks.  Obesity Michelle Griffin is currently in the action stage of change. As such, her goal is to continue with weight loss efforts. She has agreed to keep a food  journal with 1500 calories and 95 grams of protein daily. Michelle Griffin has been instructed to work up to a goal of 150 minutes of combined cardio and strengthening exercise per week for weight loss and overall health benefits. We discussed the following Behavioral Modification Strategies today: work on meal planning, easy cooking plans, and keeping healthy foods in the home.  [name] has agreed to follow-up with our clinic in 2 weeks. She was informed of the importance of frequent follow-up visits to maximize her success with intensive lifestyle modifications for her multiple health conditions.  ALLERGIES: Allergies  Allergen Reactions  . Sulfa Antibiotics Hives    MEDICATIONS: Current Outpatient Medications on File Prior to Visit  Medication Sig Dispense Refill  . albuterol (PROVENTIL HFA;VENTOLIN HFA) 108 (90 BASE) MCG/ACT inhaler Inhale 2 puffs into the lungs every 6 (six) hours as needed for wheezing or shortness of breath. 1 Inhaler 0  . azelastine (ASTELIN) 0.1 % nasal spray Place 2 sprays into both nostrils 2 (two) times daily. Use in each nostril as directed 30 mL 0  . EPINEPHrine (EPI-PEN) 0.3 mg/0.3 mL SOAJ injection Inject 0.3 mg into the muscle once.    . famotidine (PEPCID) 40 MG tablet TAKE 1 TABLET BY MOUTH ONCE DAILY 90 tablet 0  . fluticasone (FLONASE) 50 MCG/ACT nasal spray Place into both nostrils daily.    [name] ibuprofen (ADVIL,MOTRIN) 200 MG tablet Take 600 mg by mouth every 4 (four) hours as needed.    . Lactobacillus (  PROBIOTIC ACIDOPHILUS) CAPS Take 1 capsule by mouth daily.    [name] levocetirizine (XYZAL) 5 MG tablet Take 5 mg by mouth every evening.    . montelukast (SINGULAIR) 10 MG tablet Take 10 mg by mouth at bedtime.    . Multiple Vitamins-Minerals (MULTIVITAMIN WITH MINERALS) tablet Take 1 tablet by mouth daily.    . Naltrexone-buPROPion HCl ER (CONTRAVE) 8-90 MG TB12 Take 2 tablets by mouth 2 (two) times daily. 120 tablet 0  . nitroGLYCERIN (NITRODUR - DOSED IN MG/24 HR)  0.2 mg/hr patch PLACE 1 PATCH (0.2 MG TOTAL) ONTO THE SKIN DAILY. 30 patch 11  . sertraline (ZOLOFT) 100 MG tablet Take 1 tablet (100 mg total) by mouth daily. 90 tablet 0  . sertraline (ZOLOFT) 100 MG tablet Take 1 tablet (100 mg total) by mouth daily. 90 tablet 0  . Vitamin D, Ergocalciferol, (DRISDOL) 1.25 MG (50000 UT) CAPS capsule Take 1 capsule (50,000 Units total) by mouth every 7 (seven) days. 4 capsule 0   No current facility-administered medications on file prior to visit.     PAST MEDICAL HISTORY: Past Medical History:  Diagnosis Date  . Allergy    sees [name]  . Anemia    hx/o iron deficiency due to heavy periods  . Asthma   . Bursitis   . DDD (degenerative disc disease), lumbar   . Fatty liver   . GERD (gastroesophageal reflux disease)    [name]  . H/O mammogram   . Hiatal hernia    small per EGD, [name]  . Hyperlipidemia 2012   was on pravastatin prior  . Migraine    prior eval with neurologist, Dr. Melton Alar  . Overactive bladder   . PMDD (premenstrual dysphoric disorder)   . Pyelonephritis    age 48yo  . Routine gynecological examination    [place], [name], NP  . Uterine infection 07/2015   s/p novasure ablation  . Vitamin D deficiency   . Wears glasses     PAST SURGICAL HISTORY: Past Surgical History:  Procedure Laterality Date  . CHOLECYSTECTOMY    . COLONOSCOPY  2014   repeat 5 years; [name], baseline  . ESOPHAGOGASTRODUODENOSCOPY  2014   [name]  . FOOT SURGERY Left    morton's neuroma  . LASIK     [name]  . LUMBAR DISC SURGERY  07/2013   due to herniated disc, [name]  . NOVASURE ABLATION    . WISDOM TOOTH EXTRACTION      SOCIAL HISTORY: Social History   Tobacco Use  . Smoking status: Never Smoker  . Smokeless tobacco: Never Used  Substance Use Topics  . Alcohol use: Yes    Alcohol/week: 3.0 standard drinks    Types: 3 Cans of beer per week    Comment: socially  . Drug use: No     FAMILY HISTORY: Family History  Problem Relation Age of Onset  . Lupus Mother   . Hypertension Mother   . Hyperlipidemia Mother   . Migraines Mother   . Kidney disease Mother   . Hypertension Father   . Diabetes Father   . Hyperlipidemia Father   . Depression Father   . Anxiety disorder Father   . Dementia Father   . Obesity Sister   . Hypertension Sister   . Diabetes Sister   . Heart disease Paternal Uncle        CABG  . Hyperlipidemia Paternal Uncle   . Cancer Paternal Uncle  multiple with colon cancer  . Cancer Cousin        breast  . Cancer Cousin        ovarian  . Ovarian cysts Daughter   . Stroke Neg Hx    ROS: Review of Systems  Gastrointestinal: Negative for nausea and vomiting.  Musculoskeletal:       Negative for muscle weakness.   PHYSICAL EXAM: Pt in no acute distress  RECENT LABS AND TESTS: BMET    Component Value Date/Time   NA 137 09/29/2018 1011   K 4.1 09/29/2018 1011   CL 100 09/29/2018 1011   CO2 20 09/29/2018 1011   GLUCOSE 85 09/29/2018 1011   GLUCOSE 89 03/23/2017 0841   BUN 12 09/29/2018 1011   CREATININE 0.80 09/29/2018 1011   CREATININE 0.81 03/23/2017 0841   CALCIUM 9.2 09/29/2018 1011   GFRNONAA 89 09/29/2018 1011   GFRAA 102 09/29/2018 1011   Lab Results  Component Value Date   HGBA1C 5.3 09/29/2018   HGBA1C 5.7 (H) 03/02/2018   HGBA1C 5.5 11/03/2017   HGBA1C 5.5 03/23/2017   HGBA1C 5.4 02/17/2016   Lab Results  Component Value Date   INSULIN 12.0 09/29/2018   INSULIN 14.0 03/02/2018   INSULIN 10.7 11/03/2017   CBC    Component Value Date/Time   WBC 5.1 03/02/2018 0958   WBC 5.2 03/23/2017 0841   RBC 5.27 03/02/2018 0958   RBC 4.68 03/23/2017 0841   HGB 13.3 03/02/2018 0958   HCT 41.4 03/02/2018 0958   PLT 259 03/23/2017 0841   MCV 79 03/02/2018 0958   MCH 25.2 (L) 03/02/2018 0958   MCH 27.8 03/23/2017 0841   MCHC 32.1 03/02/2018 0958   MCHC 33.9 03/23/2017 0841   RDW 17.0 (H) 03/02/2018 0958    LYMPHSABS 1.5 03/02/2018 0958   MONOABS 1.5 (H) 07/30/2015 0521   EOSABS 0.0 03/02/2018 0958   BASOSABS 0.1 03/02/2018 0958   Iron/TIBC/Ferritin/ %Sat    Component Value Date/Time   IRON 97 02/13/2015 0001   TIBC 339 02/13/2015 0001   IRONPCTSAT 29 02/13/2015 0001   Lipid Panel     Component Value Date/Time   CHOL 238 (H) 09/29/2018 1011   TRIG 107 09/29/2018 1011   HDL 91 09/29/2018 1011   CHOLHDL 2.7 03/23/2017 0841   VLDL 24 02/17/2016 0728   LDLCALC 126 (H) 09/29/2018 1011   LDLCALC 119 (H) 03/23/2017 0841   Hepatic Function Panel     Component Value Date/Time   PROT 6.9 09/29/2018 1011   ALBUMIN 4.3 09/29/2018 1011   AST 21 09/29/2018 1011   ALT 24 09/29/2018 1011   ALKPHOS 89 09/29/2018 1011   BILITOT 0.3 09/29/2018 1011   BILIDIR 0.1 08/21/2014 0853   IBILI 0.2 08/21/2014 0853      Component Value Date/Time   TSH 1.480 11/03/2017 1153   TSH 2.63 02/17/2016 0728   TSH 2.697 08/09/2013 1031   Results for MARIANITA, BOTKIN (MRN [id number]) as of 10/18/2018 08:24  Ref. Range 09/29/2018 10:11  Vitamin D, 25-Hydroxy Latest Ref Range: 30.0 - 100.0 ng/mL 34.8    I, Michaelene Song, am acting as Location manager for [employer], PA-C I, Abby Potash, PA-C have reviewed above note and agree with its content

## 2018-10-21 DIAGNOSIS — J301 Allergic rhinitis due to pollen: Secondary | ICD-10-CM | POA: Diagnosis not present

## 2018-10-21 DIAGNOSIS — J3089 Other allergic rhinitis: Secondary | ICD-10-CM | POA: Diagnosis not present

## 2018-10-21 DIAGNOSIS — J3081 Allergic rhinitis due to animal (cat) (dog) hair and dander: Secondary | ICD-10-CM | POA: Diagnosis not present

## 2018-10-27 ENCOUNTER — Other Ambulatory Visit (INDEPENDENT_AMBULATORY_CARE_PROVIDER_SITE_OTHER): Payer: Self-pay | Admitting: Physician Assistant

## 2018-10-27 DIAGNOSIS — J301 Allergic rhinitis due to pollen: Secondary | ICD-10-CM | POA: Diagnosis not present

## 2018-10-27 DIAGNOSIS — J3081 Allergic rhinitis due to animal (cat) (dog) hair and dander: Secondary | ICD-10-CM | POA: Diagnosis not present

## 2018-10-27 DIAGNOSIS — J3089 Other allergic rhinitis: Secondary | ICD-10-CM | POA: Diagnosis not present

## 2018-10-27 MED FILL — MONTELUKAST SOD 10 MG TAB: 10 | 90 days supply | Qty: 90 | Fill #2

## 2018-10-27 MED FILL — LEVOCETIRIZINE 5 MG TABLET: 5 | 90 days supply | Qty: 90 | Fill #2

## 2018-10-28 ENCOUNTER — Encounter (INDEPENDENT_AMBULATORY_CARE_PROVIDER_SITE_OTHER): Payer: Self-pay | Admitting: Physician Assistant

## 2018-11-01 ENCOUNTER — Ambulatory Visit (INDEPENDENT_AMBULATORY_CARE_PROVIDER_SITE_OTHER): Payer: 59 | Admitting: Physician Assistant

## 2018-11-04 DIAGNOSIS — J3089 Other allergic rhinitis: Secondary | ICD-10-CM | POA: Diagnosis not present

## 2018-11-04 DIAGNOSIS — J3081 Allergic rhinitis due to animal (cat) (dog) hair and dander: Secondary | ICD-10-CM | POA: Diagnosis not present

## 2018-11-04 DIAGNOSIS — J301 Allergic rhinitis due to pollen: Secondary | ICD-10-CM | POA: Diagnosis not present

## 2018-11-09 MED FILL — SM ACID REDUCER 20 MG TAB: 20 | 25 days supply | Qty: 50 | Fill #2

## 2018-11-09 MED FILL — VIT D2 1.25 MG (50,000 UNIT: 1.25 MG | 28 days supply | Qty: 4 | Fill #0

## 2018-11-10 MED FILL — AZELASTINE HCL 137 MCG SPRY: 0.1 | 30 days supply | Qty: 30 | Fill #2

## 2018-11-10 MED FILL — FLUTICASONE PROP 50 MCG SPR: 50 | 90 days supply | Qty: 48 | Fill #1

## 2018-11-11 ENCOUNTER — Telehealth: Payer: Self-pay | Admitting: Family Medicine

## 2018-11-11 NOTE — Telephone Encounter (Signed)
[facility] sent request for bigger supply per pt of Methprednisolone 4 mg tab to [facility]

## 2018-11-14 NOTE — Telephone Encounter (Signed)
I am not aware of recent request for this script?

## 2018-11-15 NOTE — Telephone Encounter (Signed)
I'll come talk to you

## 2018-11-15 NOTE — Telephone Encounter (Signed)
Called pt, lmtrc as Michelle Griffin advised he has never written this rx for her.

## 2018-11-15 NOTE — Telephone Encounter (Signed)
Are you ok with larger supply?

## 2018-11-16 NOTE — Telephone Encounter (Signed)
Spoke with Michelle Griffin, she has no idea why [facility] would be asking for Methylprenisolone.  She did not request.

## 2018-11-17 DIAGNOSIS — J3081 Allergic rhinitis due to animal (cat) (dog) hair and dander: Secondary | ICD-10-CM | POA: Diagnosis not present

## 2018-11-17 DIAGNOSIS — J301 Allergic rhinitis due to pollen: Secondary | ICD-10-CM | POA: Diagnosis not present

## 2018-11-17 DIAGNOSIS — J3089 Other allergic rhinitis: Secondary | ICD-10-CM | POA: Diagnosis not present

## 2018-11-18 DIAGNOSIS — J3089 Other allergic rhinitis: Secondary | ICD-10-CM | POA: Diagnosis not present

## 2018-11-18 DIAGNOSIS — J3081 Allergic rhinitis due to animal (cat) (dog) hair and dander: Secondary | ICD-10-CM | POA: Diagnosis not present

## 2018-11-23 MED FILL — SERTRALINE HCL 100 MG TAB: 100 | 90 days supply | Qty: 90 | Fill #0

## 2018-11-24 DIAGNOSIS — J301 Allergic rhinitis due to pollen: Secondary | ICD-10-CM | POA: Diagnosis not present

## 2018-11-24 DIAGNOSIS — J3089 Other allergic rhinitis: Secondary | ICD-10-CM | POA: Diagnosis not present

## 2018-11-24 DIAGNOSIS — J3081 Allergic rhinitis due to animal (cat) (dog) hair and dander: Secondary | ICD-10-CM | POA: Diagnosis not present

## 2018-11-30 DIAGNOSIS — J3089 Other allergic rhinitis: Secondary | ICD-10-CM | POA: Diagnosis not present

## 2018-11-30 DIAGNOSIS — J301 Allergic rhinitis due to pollen: Secondary | ICD-10-CM | POA: Diagnosis not present

## 2018-11-30 DIAGNOSIS — J3081 Allergic rhinitis due to animal (cat) (dog) hair and dander: Secondary | ICD-10-CM | POA: Diagnosis not present

## 2018-12-08 DIAGNOSIS — J301 Allergic rhinitis due to pollen: Secondary | ICD-10-CM | POA: Diagnosis not present

## 2018-12-08 DIAGNOSIS — J3081 Allergic rhinitis due to animal (cat) (dog) hair and dander: Secondary | ICD-10-CM | POA: Diagnosis not present

## 2018-12-08 DIAGNOSIS — J3089 Other allergic rhinitis: Secondary | ICD-10-CM | POA: Diagnosis not present

## 2018-12-21 DIAGNOSIS — J301 Allergic rhinitis due to pollen: Secondary | ICD-10-CM | POA: Diagnosis not present

## 2018-12-21 DIAGNOSIS — J3081 Allergic rhinitis due to animal (cat) (dog) hair and dander: Secondary | ICD-10-CM | POA: Diagnosis not present

## 2018-12-21 DIAGNOSIS — J3089 Other allergic rhinitis: Secondary | ICD-10-CM | POA: Diagnosis not present

## 2018-12-26 ENCOUNTER — Other Ambulatory Visit: Payer: Self-pay | Admitting: Medical

## 2018-12-26 MED FILL — FAMOTIDINE 20 MG TABLET: 20 | 30 days supply | Qty: 60 | Fill #0

## 2018-12-28 DIAGNOSIS — J3089 Other allergic rhinitis: Secondary | ICD-10-CM | POA: Diagnosis not present

## 2018-12-28 DIAGNOSIS — J301 Allergic rhinitis due to pollen: Secondary | ICD-10-CM | POA: Diagnosis not present

## 2018-12-28 DIAGNOSIS — J3081 Allergic rhinitis due to animal (cat) (dog) hair and dander: Secondary | ICD-10-CM | POA: Diagnosis not present

## 2018-12-29 MED FILL — AZELASTINE HCL 137 MCG SPRY: 0.1 | 30 days supply | Qty: 30 | Fill #3

## 2019-01-11 DIAGNOSIS — J3081 Allergic rhinitis due to animal (cat) (dog) hair and dander: Secondary | ICD-10-CM | POA: Diagnosis not present

## 2019-01-11 DIAGNOSIS — J301 Allergic rhinitis due to pollen: Secondary | ICD-10-CM | POA: Diagnosis not present

## 2019-01-11 DIAGNOSIS — J3089 Other allergic rhinitis: Secondary | ICD-10-CM | POA: Diagnosis not present

## 2019-01-20 DIAGNOSIS — J301 Allergic rhinitis due to pollen: Secondary | ICD-10-CM | POA: Diagnosis not present

## 2019-01-20 DIAGNOSIS — J3081 Allergic rhinitis due to animal (cat) (dog) hair and dander: Secondary | ICD-10-CM | POA: Diagnosis not present

## 2019-01-20 DIAGNOSIS — J3089 Other allergic rhinitis: Secondary | ICD-10-CM | POA: Diagnosis not present

## 2019-01-23 MED FILL — MONTELUKAST SOD 10 MG TAB: 10 | 90 days supply | Qty: 90 | Fill #0

## 2019-01-23 MED FILL — LEVOCETIRIZINE 5 MG TABLET: 5 | 90 days supply | Qty: 90 | Fill #0

## 2019-01-26 DIAGNOSIS — J301 Allergic rhinitis due to pollen: Secondary | ICD-10-CM | POA: Diagnosis not present

## 2019-01-26 DIAGNOSIS — J3089 Other allergic rhinitis: Secondary | ICD-10-CM | POA: Diagnosis not present

## 2019-01-26 DIAGNOSIS — J3081 Allergic rhinitis due to animal (cat) (dog) hair and dander: Secondary | ICD-10-CM | POA: Diagnosis not present

## 2019-02-02 DIAGNOSIS — J3089 Other allergic rhinitis: Secondary | ICD-10-CM | POA: Diagnosis not present

## 2019-02-02 DIAGNOSIS — J301 Allergic rhinitis due to pollen: Secondary | ICD-10-CM | POA: Diagnosis not present

## 2019-02-02 DIAGNOSIS — J3081 Allergic rhinitis due to animal (cat) (dog) hair and dander: Secondary | ICD-10-CM | POA: Diagnosis not present

## 2019-02-13 DIAGNOSIS — J301 Allergic rhinitis due to pollen: Secondary | ICD-10-CM | POA: Diagnosis not present

## 2019-02-13 DIAGNOSIS — J3081 Allergic rhinitis due to animal (cat) (dog) hair and dander: Secondary | ICD-10-CM | POA: Diagnosis not present

## 2019-02-13 DIAGNOSIS — J3089 Other allergic rhinitis: Secondary | ICD-10-CM | POA: Diagnosis not present

## 2019-02-18 ENCOUNTER — Other Ambulatory Visit: Payer: Self-pay | Admitting: Medical

## 2019-02-18 MED FILL — FAMOTIDINE 20 MG TABS: 20 | 30 days supply | Qty: 60 | Fill #1

## 2019-02-20 MED FILL — FLUTICASONE PROP 50 MCG SPR: 50 | 90 days supply | Qty: 48 | Fill #2

## 2019-02-20 MED FILL — AZELASTINE HCL 137 MCG SPRY: 0.1 | 30 days supply | Qty: 30 | Fill #4

## 2019-02-21 MED FILL — SERTRALINE HCL 100 MG TAB: 100 | 90 days supply | Qty: 90 | Fill #0

## 2019-02-24 DIAGNOSIS — J301 Allergic rhinitis due to pollen: Secondary | ICD-10-CM | POA: Diagnosis not present

## 2019-02-24 DIAGNOSIS — J3081 Allergic rhinitis due to animal (cat) (dog) hair and dander: Secondary | ICD-10-CM | POA: Diagnosis not present

## 2019-02-24 DIAGNOSIS — J3089 Other allergic rhinitis: Secondary | ICD-10-CM | POA: Diagnosis not present

## 2019-03-01 DIAGNOSIS — J301 Allergic rhinitis due to pollen: Secondary | ICD-10-CM | POA: Diagnosis not present

## 2019-03-01 DIAGNOSIS — J3089 Other allergic rhinitis: Secondary | ICD-10-CM | POA: Diagnosis not present

## 2019-03-01 DIAGNOSIS — J3081 Allergic rhinitis due to animal (cat) (dog) hair and dander: Secondary | ICD-10-CM | POA: Diagnosis not present

## 2019-03-08 ENCOUNTER — Telehealth: Payer: 59 | Admitting: Family

## 2019-03-08 DIAGNOSIS — H109 Unspecified conjunctivitis: Secondary | ICD-10-CM

## 2019-03-08 MED ORDER — POLYMYXIN B-TRIMETHOPRIM 10000-0.1 UNIT/ML-% OP SOLN: 1.0000 [drp] | Freq: Four times a day (QID) | OPHTHALMIC | 0 refills | Status: DC

## 2019-03-08 NOTE — Progress Notes (Signed)

## 2019-03-13 DIAGNOSIS — H1031 Unspecified acute conjunctivitis, right eye: Secondary | ICD-10-CM | POA: Diagnosis not present

## 2019-03-13 DIAGNOSIS — J301 Allergic rhinitis due to pollen: Secondary | ICD-10-CM | POA: Diagnosis not present

## 2019-03-13 DIAGNOSIS — J3081 Allergic rhinitis due to animal (cat) (dog) hair and dander: Secondary | ICD-10-CM | POA: Diagnosis not present

## 2019-03-13 DIAGNOSIS — J3089 Other allergic rhinitis: Secondary | ICD-10-CM | POA: Diagnosis not present

## 2019-03-23 DIAGNOSIS — J3089 Other allergic rhinitis: Secondary | ICD-10-CM | POA: Diagnosis not present

## 2019-03-23 DIAGNOSIS — J3081 Allergic rhinitis due to animal (cat) (dog) hair and dander: Secondary | ICD-10-CM | POA: Diagnosis not present

## 2019-03-23 DIAGNOSIS — J301 Allergic rhinitis due to pollen: Secondary | ICD-10-CM | POA: Diagnosis not present

## 2019-03-27 DIAGNOSIS — J3089 Other allergic rhinitis: Secondary | ICD-10-CM | POA: Diagnosis not present

## 2019-03-27 DIAGNOSIS — J301 Allergic rhinitis due to pollen: Secondary | ICD-10-CM | POA: Diagnosis not present

## 2019-03-27 DIAGNOSIS — H1045 Other chronic allergic conjunctivitis: Secondary | ICD-10-CM | POA: Diagnosis not present

## 2019-03-27 DIAGNOSIS — J3081 Allergic rhinitis due to animal (cat) (dog) hair and dander: Secondary | ICD-10-CM | POA: Diagnosis not present

## 2019-03-27 DIAGNOSIS — J4520 Mild intermittent asthma, uncomplicated: Secondary | ICD-10-CM | POA: Diagnosis not present

## 2019-03-27 MED FILL — ALBUTEROL SULFATE HFA 108 (: 108 (90 BAS | 17 days supply | Qty: 18 | Fill #0

## 2019-04-05 DIAGNOSIS — J3089 Other allergic rhinitis: Secondary | ICD-10-CM | POA: Diagnosis not present

## 2019-04-05 DIAGNOSIS — J301 Allergic rhinitis due to pollen: Secondary | ICD-10-CM | POA: Diagnosis not present

## 2019-04-05 DIAGNOSIS — J3081 Allergic rhinitis due to animal (cat) (dog) hair and dander: Secondary | ICD-10-CM | POA: Diagnosis not present

## 2019-04-07 ENCOUNTER — Other Ambulatory Visit: Payer: Self-pay | Admitting: Physical Medicine and Rehabilitation

## 2019-04-07 DIAGNOSIS — M7061 Trochanteric bursitis, right hip: Secondary | ICD-10-CM | POA: Diagnosis not present

## 2019-04-07 DIAGNOSIS — M5416 Radiculopathy, lumbar region: Secondary | ICD-10-CM | POA: Diagnosis not present

## 2019-04-07 DIAGNOSIS — M5126 Other intervertebral disc displacement, lumbar region: Secondary | ICD-10-CM

## 2019-04-07 MED FILL — AZELASTINE HCL 0.05 % SOLN: 0.05 | 30 days supply | Qty: 6 | Fill #0

## 2019-04-07 MED FILL — MELOXICAM 7.5 MG TABLET: 7.5 | 30 days supply | Qty: 60 | Fill #0

## 2019-04-10 ENCOUNTER — Ambulatory Visit (INDEPENDENT_AMBULATORY_CARE_PROVIDER_SITE_OTHER): Payer: 59

## 2019-04-10 ENCOUNTER — Other Ambulatory Visit: Payer: Self-pay

## 2019-04-10 DIAGNOSIS — M5126 Other intervertebral disc displacement, lumbar region: Secondary | ICD-10-CM | POA: Diagnosis not present

## 2019-04-10 MED ORDER — GADOBUTROL 1 MMOL/ML IV SOLN
10.0000 mL | Freq: Once | INTRAVENOUS | Status: AC | PRN
  Administered 2019-04-10: 09:00:00 10 mL via INTRAVENOUS

## 2019-04-11 DIAGNOSIS — J3081 Allergic rhinitis due to animal (cat) (dog) hair and dander: Secondary | ICD-10-CM | POA: Diagnosis not present

## 2019-04-11 DIAGNOSIS — J301 Allergic rhinitis due to pollen: Secondary | ICD-10-CM | POA: Diagnosis not present

## 2019-04-11 DIAGNOSIS — J3089 Other allergic rhinitis: Secondary | ICD-10-CM | POA: Diagnosis not present

## 2019-04-12 DIAGNOSIS — M5416 Radiculopathy, lumbar region: Secondary | ICD-10-CM | POA: Diagnosis not present

## 2019-04-24 DIAGNOSIS — J301 Allergic rhinitis due to pollen: Secondary | ICD-10-CM | POA: Diagnosis not present

## 2019-04-24 DIAGNOSIS — J3089 Other allergic rhinitis: Secondary | ICD-10-CM | POA: Diagnosis not present

## 2019-04-24 DIAGNOSIS — J3081 Allergic rhinitis due to animal (cat) (dog) hair and dander: Secondary | ICD-10-CM | POA: Diagnosis not present

## 2019-05-02 DIAGNOSIS — M5416 Radiculopathy, lumbar region: Secondary | ICD-10-CM | POA: Diagnosis not present

## 2019-05-03 DIAGNOSIS — J301 Allergic rhinitis due to pollen: Secondary | ICD-10-CM | POA: Diagnosis not present

## 2019-05-04 DIAGNOSIS — J3081 Allergic rhinitis due to animal (cat) (dog) hair and dander: Secondary | ICD-10-CM | POA: Diagnosis not present

## 2019-05-04 DIAGNOSIS — J3089 Other allergic rhinitis: Secondary | ICD-10-CM | POA: Diagnosis not present

## 2019-05-04 DIAGNOSIS — J301 Allergic rhinitis due to pollen: Secondary | ICD-10-CM | POA: Diagnosis not present

## 2019-05-12 DIAGNOSIS — J301 Allergic rhinitis due to pollen: Secondary | ICD-10-CM | POA: Diagnosis not present

## 2019-05-12 DIAGNOSIS — J3089 Other allergic rhinitis: Secondary | ICD-10-CM | POA: Diagnosis not present

## 2019-05-12 DIAGNOSIS — J3081 Allergic rhinitis due to animal (cat) (dog) hair and dander: Secondary | ICD-10-CM | POA: Diagnosis not present

## 2019-05-22 ENCOUNTER — Other Ambulatory Visit: Payer: Self-pay | Admitting: Medical

## 2019-05-22 DIAGNOSIS — J3081 Allergic rhinitis due to animal (cat) (dog) hair and dander: Secondary | ICD-10-CM | POA: Diagnosis not present

## 2019-05-22 DIAGNOSIS — J3089 Other allergic rhinitis: Secondary | ICD-10-CM | POA: Diagnosis not present

## 2019-05-22 DIAGNOSIS — J301 Allergic rhinitis due to pollen: Secondary | ICD-10-CM | POA: Diagnosis not present

## 2019-05-24 ENCOUNTER — Ambulatory Visit: Payer: 59 | Attending: Internal Medicine

## 2019-05-24 DIAGNOSIS — Z20828 Contact with and (suspected) exposure to other viral communicable diseases: Secondary | ICD-10-CM | POA: Diagnosis not present

## 2019-05-24 DIAGNOSIS — Z20822 Contact with and (suspected) exposure to covid-19: Secondary | ICD-10-CM

## 2019-05-24 MED FILL — SERTRALINE HCL 100 MG TAB: 100 | 90 days supply | Qty: 90 | Fill #0

## 2019-05-26 DIAGNOSIS — U071 COVID-19: Secondary | ICD-10-CM

## 2019-05-26 HISTORY — DX: COVID-19: U07.1

## 2019-05-26 LAB — NOVEL CORONAVIRUS, NAA: SARS-CoV-2, NAA: DETECTED — AB

## 2019-06-07 DIAGNOSIS — J301 Allergic rhinitis due to pollen: Secondary | ICD-10-CM | POA: Diagnosis not present

## 2019-06-07 DIAGNOSIS — J3081 Allergic rhinitis due to animal (cat) (dog) hair and dander: Secondary | ICD-10-CM | POA: Diagnosis not present

## 2019-06-07 DIAGNOSIS — J3089 Other allergic rhinitis: Secondary | ICD-10-CM | POA: Diagnosis not present

## 2019-06-13 DIAGNOSIS — M5116 Intervertebral disc disorders with radiculopathy, lumbar region: Secondary | ICD-10-CM | POA: Diagnosis not present

## 2019-06-13 DIAGNOSIS — M5417 Radiculopathy, lumbosacral region: Secondary | ICD-10-CM | POA: Diagnosis not present

## 2019-06-13 DIAGNOSIS — J301 Allergic rhinitis due to pollen: Secondary | ICD-10-CM | POA: Diagnosis not present

## 2019-06-13 DIAGNOSIS — J3081 Allergic rhinitis due to animal (cat) (dog) hair and dander: Secondary | ICD-10-CM | POA: Diagnosis not present

## 2019-06-13 DIAGNOSIS — J3089 Other allergic rhinitis: Secondary | ICD-10-CM | POA: Diagnosis not present

## 2019-06-13 DIAGNOSIS — M5416 Radiculopathy, lumbar region: Secondary | ICD-10-CM | POA: Diagnosis not present

## 2019-06-13 MED FILL — METHOCARBAMOL 500 MG TABS: 500 | 15 days supply | Qty: 60 | Fill #0

## 2019-06-13 MED FILL — OXYCODONE-APAP 5-325MG: 5-325 | 10 days supply | Qty: 40 | Fill #0

## 2019-06-21 ENCOUNTER — Other Ambulatory Visit: Payer: Self-pay | Admitting: Obstetrics and Gynecology

## 2019-06-21 DIAGNOSIS — Z1231 Encounter for screening mammogram for malignant neoplasm of breast: Secondary | ICD-10-CM

## 2019-06-23 DIAGNOSIS — J3089 Other allergic rhinitis: Secondary | ICD-10-CM | POA: Diagnosis not present

## 2019-06-23 DIAGNOSIS — J3081 Allergic rhinitis due to animal (cat) (dog) hair and dander: Secondary | ICD-10-CM | POA: Diagnosis not present

## 2019-06-23 DIAGNOSIS — J301 Allergic rhinitis due to pollen: Secondary | ICD-10-CM | POA: Diagnosis not present

## 2019-06-28 ENCOUNTER — Other Ambulatory Visit: Payer: Self-pay | Admitting: Medical

## 2019-06-28 DIAGNOSIS — J3089 Other allergic rhinitis: Secondary | ICD-10-CM | POA: Diagnosis not present

## 2019-06-28 DIAGNOSIS — J3081 Allergic rhinitis due to animal (cat) (dog) hair and dander: Secondary | ICD-10-CM | POA: Diagnosis not present

## 2019-06-28 DIAGNOSIS — J301 Allergic rhinitis due to pollen: Secondary | ICD-10-CM | POA: Diagnosis not present

## 2019-06-28 MED FILL — FLUTICASONE PROP 50 MCG SPR: 50 | 90 days supply | Qty: 48 | Fill #0

## 2019-06-28 MED FILL — FAMOTIDINE 20 MG TABS: 20 | 30 days supply | Qty: 60 | Fill #0

## 2019-06-28 MED FILL — AZELASTINE HCL 0.05 % SOLN: 0.05 | 30 days supply | Qty: 6 | Fill #1

## 2019-07-04 DIAGNOSIS — J301 Allergic rhinitis due to pollen: Secondary | ICD-10-CM | POA: Diagnosis not present

## 2019-07-04 DIAGNOSIS — J3089 Other allergic rhinitis: Secondary | ICD-10-CM | POA: Diagnosis not present

## 2019-07-04 DIAGNOSIS — J3081 Allergic rhinitis due to animal (cat) (dog) hair and dander: Secondary | ICD-10-CM | POA: Diagnosis not present

## 2019-07-21 DIAGNOSIS — J301 Allergic rhinitis due to pollen: Secondary | ICD-10-CM | POA: Diagnosis not present

## 2019-07-21 DIAGNOSIS — J3089 Other allergic rhinitis: Secondary | ICD-10-CM | POA: Diagnosis not present

## 2019-07-21 DIAGNOSIS — J3081 Allergic rhinitis due to animal (cat) (dog) hair and dander: Secondary | ICD-10-CM | POA: Diagnosis not present

## 2019-07-21 MED FILL — AZELASTINE HCL 137 MCG SPRY: 0.1 | 75 days supply | Qty: 90 | Fill #0

## 2019-07-25 ENCOUNTER — Ambulatory Visit
Admission: RE | Admit: 2019-07-25 | Discharge: 2019-07-25 | Disposition: A | Discharge status: Home / Self Care | Payer: 59 | Source: Ambulatory Visit | Attending: Obstetrics and Gynecology | Admitting: Obstetrics and Gynecology

## 2019-07-25 ENCOUNTER — Other Ambulatory Visit: Payer: Self-pay

## 2019-07-25 DIAGNOSIS — J3089 Other allergic rhinitis: Secondary | ICD-10-CM | POA: Diagnosis not present

## 2019-07-25 DIAGNOSIS — J301 Allergic rhinitis due to pollen: Secondary | ICD-10-CM | POA: Diagnosis not present

## 2019-07-25 DIAGNOSIS — J3081 Allergic rhinitis due to animal (cat) (dog) hair and dander: Secondary | ICD-10-CM | POA: Diagnosis not present

## 2019-07-25 DIAGNOSIS — Z1231 Encounter for screening mammogram for malignant neoplasm of breast: Secondary | ICD-10-CM

## 2019-07-25 MED FILL — MONTELUKAST SOD 10 MG TAB: 10 | 90 days supply | Qty: 90 | Fill #1

## 2019-07-25 MED FILL — LEVOCETIRIZINE 5 MG TABLET: 5 | 90 days supply | Qty: 90 | Fill #1

## 2019-07-31 DIAGNOSIS — H524 Presbyopia: Secondary | ICD-10-CM | POA: Diagnosis not present

## 2019-08-10 DIAGNOSIS — J301 Allergic rhinitis due to pollen: Secondary | ICD-10-CM | POA: Diagnosis not present

## 2019-08-10 DIAGNOSIS — J3081 Allergic rhinitis due to animal (cat) (dog) hair and dander: Secondary | ICD-10-CM | POA: Diagnosis not present

## 2019-08-10 DIAGNOSIS — J3089 Other allergic rhinitis: Secondary | ICD-10-CM | POA: Diagnosis not present

## 2019-08-17 ENCOUNTER — Other Ambulatory Visit: Payer: Self-pay | Admitting: Medical

## 2019-08-18 MED FILL — SERTRALINE HCL 100 MG TAB: 100 | 30 days supply | Qty: 30 | Fill #0

## 2019-08-18 MED FILL — FAMOTIDINE 20 MG TABLET: 20 | 30 days supply | Qty: 60 | Fill #0

## 2019-08-18 NOTE — Telephone Encounter (Signed)
Patient has an appointment scheduled for 09/08/19

## 2019-08-18 NOTE — Telephone Encounter (Signed)
Get in for physical fasting, can send 30 day supply

## 2019-08-22 DIAGNOSIS — J3089 Other allergic rhinitis: Secondary | ICD-10-CM | POA: Diagnosis not present

## 2019-08-22 DIAGNOSIS — J301 Allergic rhinitis due to pollen: Secondary | ICD-10-CM | POA: Diagnosis not present

## 2019-08-22 DIAGNOSIS — J3081 Allergic rhinitis due to animal (cat) (dog) hair and dander: Secondary | ICD-10-CM | POA: Diagnosis not present

## 2019-08-29 DIAGNOSIS — J3081 Allergic rhinitis due to animal (cat) (dog) hair and dander: Secondary | ICD-10-CM | POA: Diagnosis not present

## 2019-08-29 DIAGNOSIS — J301 Allergic rhinitis due to pollen: Secondary | ICD-10-CM | POA: Diagnosis not present

## 2019-08-29 DIAGNOSIS — J3089 Other allergic rhinitis: Secondary | ICD-10-CM | POA: Diagnosis not present

## 2019-09-06 DIAGNOSIS — J3089 Other allergic rhinitis: Secondary | ICD-10-CM | POA: Diagnosis not present

## 2019-09-06 DIAGNOSIS — J301 Allergic rhinitis due to pollen: Secondary | ICD-10-CM | POA: Diagnosis not present

## 2019-09-06 DIAGNOSIS — J3081 Allergic rhinitis due to animal (cat) (dog) hair and dander: Secondary | ICD-10-CM | POA: Diagnosis not present

## 2019-09-08 ENCOUNTER — Other Ambulatory Visit: Payer: Self-pay | Admitting: Medical

## 2019-09-08 ENCOUNTER — Other Ambulatory Visit: Payer: Self-pay

## 2019-09-08 ENCOUNTER — Encounter: Payer: Self-pay | Admitting: Medical

## 2019-09-08 ENCOUNTER — Ambulatory Visit (INDEPENDENT_AMBULATORY_CARE_PROVIDER_SITE_OTHER): Payer: 59 | Admitting: Medical

## 2019-09-08 VITALS — BP 130/78 | HR 102 | Temp 98.4°F | Ht 66.0 in | Wt 234.6 lb

## 2019-09-08 DIAGNOSIS — D259 Leiomyoma of uterus, unspecified: Secondary | ICD-10-CM

## 2019-09-08 DIAGNOSIS — Z13220 Encounter for screening for lipoid disorders: Secondary | ICD-10-CM | POA: Diagnosis not present

## 2019-09-08 DIAGNOSIS — R7301 Impaired fasting glucose: Secondary | ICD-10-CM

## 2019-09-08 DIAGNOSIS — E669 Obesity, unspecified: Secondary | ICD-10-CM

## 2019-09-08 DIAGNOSIS — J4530 Mild persistent asthma, uncomplicated: Secondary | ICD-10-CM | POA: Diagnosis not present

## 2019-09-08 DIAGNOSIS — K219 Gastro-esophageal reflux disease without esophagitis: Secondary | ICD-10-CM

## 2019-09-08 DIAGNOSIS — M5137 Other intervertebral disc degeneration, lumbosacral region: Secondary | ICD-10-CM

## 2019-09-08 DIAGNOSIS — Z0000 Encounter for general adult medical examination without abnormal findings: Secondary | ICD-10-CM

## 2019-09-08 DIAGNOSIS — J301 Allergic rhinitis due to pollen: Secondary | ICD-10-CM | POA: Diagnosis not present

## 2019-09-08 DIAGNOSIS — N3281 Overactive bladder: Secondary | ICD-10-CM

## 2019-09-08 DIAGNOSIS — K760 Fatty (change of) liver, not elsewhere classified: Secondary | ICD-10-CM | POA: Diagnosis not present

## 2019-09-08 DIAGNOSIS — E785 Hyperlipidemia, unspecified: Secondary | ICD-10-CM

## 2019-09-08 DIAGNOSIS — E559 Vitamin D deficiency, unspecified: Secondary | ICD-10-CM | POA: Diagnosis not present

## 2019-09-08 DIAGNOSIS — T300 Burn of unspecified body region, unspecified degree: Secondary | ICD-10-CM | POA: Insufficient documentation

## 2019-09-08 DIAGNOSIS — F3281 Premenstrual dysphoric disorder: Secondary | ICD-10-CM

## 2019-09-08 DIAGNOSIS — G4700 Insomnia, unspecified: Secondary | ICD-10-CM | POA: Insufficient documentation

## 2019-09-08 LAB — POCT URINALYSIS DIP (PROADVANTAGE DEVICE)
Blood, UA: NEGATIVE
Glucose, UA: NEGATIVE mg/dL
Leukocytes, UA: NEGATIVE
Nitrite, UA: NEGATIVE
Protein Ur, POC: NEGATIVE mg/dL
Specific Gravity, Urine: 1.03
Urobilinogen, Ur: NEGATIVE
pH, UA: 6 (ref 5.0–8.0)

## 2019-09-08 MED ORDER — SILVER SULFADIAZINE 1 % EX CREA: 1.0000 "application " | TOPICAL_CREAM | Freq: Every day | CUTANEOUS | 0 refills | Status: DC

## 2019-09-08 MED ORDER — FAMOTIDINE 20 MG PO TABS: 40.0000 mg | ORAL_TABLET | Freq: Every day | ORAL | 1 refills | Status: DC

## 2019-09-08 MED ORDER — ALBUTEROL SULFATE HFA 108 (90 BASE) MCG/ACT IN AERS: 2.0000 | INHALATION_SPRAY | Freq: Four times a day (QID) | RESPIRATORY_TRACT | 0 refills | Status: DC | PRN

## 2019-09-08 MED ORDER — RESTORA PO CAPS: 1.0000 | ORAL_CAPSULE | Freq: Every day | ORAL | 0 refills | Status: DC

## 2019-09-08 MED ORDER — NALTREXONE HCL 50 MG PO TABS: 50.0000 mg | ORAL_TABLET | Freq: Every day | ORAL | 1 refills | Status: DC

## 2019-09-08 MED FILL — NALTREXONE 50 MG TABLET: 50 | 90 days supply | Qty: 90 | Fill #0

## 2019-09-08 NOTE — Addendum Note (Signed)
Addended by: Carlena Hurl on: 09/08/2019 09:01 AM   Modules accepted: Orders
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
## 2019-09-09 LAB — CBC WITH DIFFERENTIAL/PLATELET
Basophils Absolute: 0 10*3/uL (ref 0.0–0.2)
Basos: 0 %
EOS (ABSOLUTE): 0 10*3/uL (ref 0.0–0.4)
Eos: 0 %
Hematocrit: 42.5 % (ref 34.0–46.6)
Hemoglobin: 14.2 g/dL (ref 11.1–15.9)
Immature Grans (Abs): 0 10*3/uL (ref 0.0–0.1)
Immature Granulocytes: 0 %
Lymphocytes Absolute: 1.9 10*3/uL (ref 0.7–3.1)
Lymphs: 27 %
MCH: 29 pg (ref 26.6–33.0)
MCHC: 33.4 g/dL (ref 31.5–35.7)
MCV: 87 fL (ref 79–97)
Monocytes Absolute: 0.9 10*3/uL (ref 0.1–0.9)
Monocytes: 14 %
Neutrophils Absolute: 3.9 10*3/uL (ref 1.4–7.0)
Neutrophils: 59 %
Platelets: 249 10*3/uL (ref 150–450)
RBC: 4.9 x10E6/uL (ref 3.77–5.28)
RDW: 12.5 % (ref 11.7–15.4)
WBC: 6.8 10*3/uL (ref 3.4–10.8)

## 2019-09-09 LAB — COMPREHENSIVE METABOLIC PANEL
ALT: 58 IU/L — ABNORMAL HIGH (ref 0–32)
AST: 47 IU/L — ABNORMAL HIGH (ref 0–40)
Albumin/Globulin Ratio: 1.5 (ref 1.2–2.2)
Albumin: 4.5 g/dL (ref 3.8–4.8)
Alkaline Phosphatase: 111 IU/L (ref 39–117)
BUN/Creatinine Ratio: 12 (ref 9–23)
BUN: 10 mg/dL (ref 6–24)
Bilirubin Total: 0.3 mg/dL (ref 0.0–1.2)
CO2: 20 mmol/L (ref 20–29)
Calcium: 9.9 mg/dL (ref 8.7–10.2)
Chloride: 103 mmol/L (ref 96–106)
Creatinine, Ser: 0.81 mg/dL (ref 0.57–1.00)
GFR calc Af Amer: 100 mL/min/{1.73_m2} (ref >59)
GFR calc non Af Amer: 87 mL/min/{1.73_m2} (ref >59)
Globulin, Total: 3.1 g/dL (ref 1.5–4.5)
Glucose: 87 mg/dL (ref 65–99)
Potassium: 4.7 mmol/L (ref 3.5–5.2)
Sodium: 139 mmol/L (ref 134–144)
Total Protein: 7.6 g/dL (ref 6.0–8.5)

## 2019-09-09 LAB — LIPID PANEL
Chol/HDL Ratio: 3 ratio (ref 0.0–4.4)
Cholesterol, Total: 247 mg/dL — ABNORMAL HIGH (ref 100–199)
HDL: 82 mg/dL (ref >39)
LDL Chol Calc (NIH): 144 mg/dL — ABNORMAL HIGH (ref 0–99)
Triglycerides: 122 mg/dL (ref 0–149)
VLDL Cholesterol Cal: 21 mg/dL (ref 5–40)

## 2019-09-09 LAB — TSH: TSH: 3.3 u[IU]/mL (ref 0.450–4.500)

## 2019-09-09 LAB — HEMOGLOBIN A1C
Est. average glucose Bld gHb Est-mCnc: 114 mg/dL
Hgb A1c MFr Bld: 5.6 % (ref 4.8–5.6)

## 2019-09-09 LAB — VITAMIN D 25 HYDROXY (VIT D DEFICIENCY, FRACTURES): Vit D, 25-Hydroxy: 23.3 ng/mL — ABNORMAL LOW (ref 30.0–100.0)

## 2019-09-11 ENCOUNTER — Other Ambulatory Visit: Payer: Self-pay | Admitting: Medical

## 2019-09-11 DIAGNOSIS — R7989 Other specified abnormal findings of blood chemistry: Secondary | ICD-10-CM

## 2019-09-11 MED ORDER — VITAMIN D 25 MCG (1000 UNIT) PO TABS: 1000.0000 [IU] | ORAL_TABLET | Freq: Every day | ORAL | 3 refills | Status: DC

## 2019-09-11 MED FILL — VITAMIN D3 25 MCG (1000 UT): 25 MCG | 100 days supply | Qty: 100 | Fill #0

## 2019-09-12 ENCOUNTER — Other Ambulatory Visit: Payer: Self-pay | Admitting: Medical

## 2019-09-12 MED FILL — FAMOTIDINE 20 MG TABLET: 20 | 90 days supply | Qty: 180 | Fill #0

## 2019-09-12 MED FILL — SERTRALINE HCL 100 MG TAB: 100 | 90 days supply | Qty: 90 | Fill #0

## 2019-09-13 DIAGNOSIS — J3089 Other allergic rhinitis: Secondary | ICD-10-CM | POA: Diagnosis not present

## 2019-09-13 DIAGNOSIS — J301 Allergic rhinitis due to pollen: Secondary | ICD-10-CM | POA: Diagnosis not present

## 2019-09-13 DIAGNOSIS — J3081 Allergic rhinitis due to animal (cat) (dog) hair and dander: Secondary | ICD-10-CM | POA: Diagnosis not present

## 2019-09-20 DIAGNOSIS — J3081 Allergic rhinitis due to animal (cat) (dog) hair and dander: Secondary | ICD-10-CM | POA: Diagnosis not present

## 2019-09-20 DIAGNOSIS — J3089 Other allergic rhinitis: Secondary | ICD-10-CM | POA: Diagnosis not present

## 2019-09-20 DIAGNOSIS — J301 Allergic rhinitis due to pollen: Secondary | ICD-10-CM | POA: Diagnosis not present

## 2019-09-24 ENCOUNTER — Telehealth: Payer: 59 | Admitting: Family

## 2019-09-24 DIAGNOSIS — Z2089 Contact with and (suspected) exposure to other communicable diseases: Secondary | ICD-10-CM | POA: Diagnosis not present

## 2019-09-24 DIAGNOSIS — Z207 Contact with and (suspected) exposure to pediculosis, acariasis and other infestations: Secondary | ICD-10-CM

## 2019-09-24 MED ORDER — PERMETHRIN 5 % EX CREA: 1.0000 "application " | TOPICAL_CREAM | Freq: Once | CUTANEOUS | 1 refills | Status: AC

## 2019-09-24 NOTE — Progress Notes (Signed)
E-Visit for Scabies  We are sorry that you are not feeling well. Here is how we plan to help!  Based on what you shared with me it looks like you have scabies.  Scabies is an infection caused by very tiny mites that burrow into the skin.  They cause severe itching. Though children are most commonly infected, anyone can get scabies.  Scabies mites can pass from person to person through physical close contact.  They can also be passed through shared clothing, towels, and bedding.  Scabies infection is not usually dangerous, but it is uncomfortable.  Because it is so contagious, scabies should be treated immediately to keep the infection from spreading.  If you have children in day care, please have any exposed children examined by their pediatrician. .   I have prescribed Permethrin topical cream 5% thoroughly massage cream from head to soles of feet; leave on for 8 to 14 hours before removing (shower or bath). May repeat if living mites are observed 14 days after first treatment; one application is generally curative.    HOME CARE:  . You should treat all members in your household whether they show symptoms or not. . Wash towels, clothes, bed linens, cloth toys, hats, and other personal items in hot water and dry on high heat. . Vacuum floors and furniture and throw away the bag afterwards. [name] Keep your child home from day care or school until the morning after treatment for scabies. . Notify your child's day care or school so that other children can be checked and treated.  GET HELP RIGHT AWAY IF:  . The infected person has a fever, red streaks, pain, or swelling of the skin. . Sores get worse or don't heal. . New rashes appear or itching continues for more than 2 weeks after treatment.  MAKE SURE YOU:   Understand these instructions.  Will watch your condition.  Will get help right away if you are not doing well or get worse.  Thank you for choosing an e-visit. Your e-visit answers  were reviewed by a board certified advanced clinical practitioner to complete your personal care plan.  Depending upon the condition, your plan could have included both over the counter or prescription medications.    Please review your pharmacy choice.  Make sure the pharmacy is open so you can pick up prescription now.   If there is a problem, you may contact your provider through [employer] and have the prescription routed to another pharmacy. Your safety is important to [name].  If you have drug allergies check your prescription carefully.    For the next 24 hours you can use MyChart to ask questions about today's visit, request a non-urgent call back, or ask for a work or school excuse.  You will get an email in the next 2 days asking about your experience.  I hope that your e-visit has been valuable and will speed your recovery.  Approximately 5 minutes was spent documenting and reviewing patient's chart.

## 2019-09-26 DIAGNOSIS — J3081 Allergic rhinitis due to animal (cat) (dog) hair and dander: Secondary | ICD-10-CM | POA: Diagnosis not present

## 2019-09-26 DIAGNOSIS — J3089 Other allergic rhinitis: Secondary | ICD-10-CM | POA: Diagnosis not present

## 2019-09-26 DIAGNOSIS — J301 Allergic rhinitis due to pollen: Secondary | ICD-10-CM | POA: Diagnosis not present

## 2019-09-29 ENCOUNTER — Other Ambulatory Visit: Payer: Self-pay

## 2019-09-29 MED ORDER — RESTORA PO CAPS: 1.0000 | ORAL_CAPSULE | Freq: Every day | ORAL | 0 refills | Status: DC

## 2019-10-02 ENCOUNTER — Other Ambulatory Visit: Payer: Self-pay | Admitting: Medical

## 2019-10-02 ENCOUNTER — Telehealth: Payer: Self-pay | Admitting: Medical

## 2019-10-02 MED ORDER — RESTORA PO CAPS: 1.0000 | ORAL_CAPSULE | Freq: Every day | ORAL | 3 refills | Status: DC

## 2019-10-02 NOTE — Telephone Encounter (Signed)
Pt called and needs refill on [facility] sent to the [facility]

## 2019-10-06 DIAGNOSIS — J3089 Other allergic rhinitis: Secondary | ICD-10-CM | POA: Diagnosis not present

## 2019-10-06 DIAGNOSIS — J301 Allergic rhinitis due to pollen: Secondary | ICD-10-CM | POA: Diagnosis not present

## 2019-10-06 DIAGNOSIS — J3081 Allergic rhinitis due to animal (cat) (dog) hair and dander: Secondary | ICD-10-CM | POA: Diagnosis not present

## 2019-10-10 DIAGNOSIS — J3089 Other allergic rhinitis: Secondary | ICD-10-CM | POA: Diagnosis not present

## 2019-10-10 DIAGNOSIS — J3081 Allergic rhinitis due to animal (cat) (dog) hair and dander: Secondary | ICD-10-CM | POA: Diagnosis not present

## 2019-10-10 DIAGNOSIS — J301 Allergic rhinitis due to pollen: Secondary | ICD-10-CM | POA: Diagnosis not present

## 2019-10-20 DIAGNOSIS — J3081 Allergic rhinitis due to animal (cat) (dog) hair and dander: Secondary | ICD-10-CM | POA: Diagnosis not present

## 2019-10-20 DIAGNOSIS — J301 Allergic rhinitis due to pollen: Secondary | ICD-10-CM | POA: Diagnosis not present

## 2019-10-20 DIAGNOSIS — J3089 Other allergic rhinitis: Secondary | ICD-10-CM | POA: Diagnosis not present

## 2019-10-26 DIAGNOSIS — J301 Allergic rhinitis due to pollen: Secondary | ICD-10-CM | POA: Diagnosis not present

## 2019-10-26 DIAGNOSIS — J3089 Other allergic rhinitis: Secondary | ICD-10-CM | POA: Diagnosis not present

## 2019-10-26 DIAGNOSIS — J3081 Allergic rhinitis due to animal (cat) (dog) hair and dander: Secondary | ICD-10-CM | POA: Diagnosis not present

## 2019-10-26 MED FILL — MONTELUKAST SOD 10 MG TAB: 10 | 90 days supply | Qty: 90 | Fill #2

## 2019-10-26 MED FILL — LEVOCETIRIZINE 5 MG TABLET: 5 | 90 days supply | Qty: 90 | Fill #2

## 2019-11-02 DIAGNOSIS — J3089 Other allergic rhinitis: Secondary | ICD-10-CM | POA: Diagnosis not present

## 2019-11-02 DIAGNOSIS — J3081 Allergic rhinitis due to animal (cat) (dog) hair and dander: Secondary | ICD-10-CM | POA: Diagnosis not present

## 2019-11-02 DIAGNOSIS — J301 Allergic rhinitis due to pollen: Secondary | ICD-10-CM | POA: Diagnosis not present

## 2019-11-08 DIAGNOSIS — J3089 Other allergic rhinitis: Secondary | ICD-10-CM | POA: Diagnosis not present

## 2019-11-08 DIAGNOSIS — J301 Allergic rhinitis due to pollen: Secondary | ICD-10-CM | POA: Diagnosis not present

## 2019-11-08 DIAGNOSIS — J3081 Allergic rhinitis due to animal (cat) (dog) hair and dander: Secondary | ICD-10-CM | POA: Diagnosis not present

## 2019-11-16 DIAGNOSIS — J3081 Allergic rhinitis due to animal (cat) (dog) hair and dander: Secondary | ICD-10-CM | POA: Diagnosis not present

## 2019-11-16 DIAGNOSIS — J3089 Other allergic rhinitis: Secondary | ICD-10-CM | POA: Diagnosis not present

## 2019-11-16 DIAGNOSIS — J301 Allergic rhinitis due to pollen: Secondary | ICD-10-CM | POA: Diagnosis not present

## 2019-11-20 DIAGNOSIS — Z6833 Body mass index (BMI) 33.0-33.9, adult: Secondary | ICD-10-CM | POA: Diagnosis not present

## 2019-11-20 DIAGNOSIS — Z01419 Encounter for gynecological examination (general) (routine) without abnormal findings: Secondary | ICD-10-CM | POA: Diagnosis not present

## 2019-11-20 DIAGNOSIS — N951 Menopausal and female climacteric states: Secondary | ICD-10-CM | POA: Diagnosis not present

## 2019-11-20 DIAGNOSIS — Z130 Encounter for screening for diseases of the blood and blood-forming organs and certain disorders involving the immune mechanism: Secondary | ICD-10-CM | POA: Diagnosis not present

## 2019-11-20 DIAGNOSIS — Z1389 Encounter for screening for other disorder: Secondary | ICD-10-CM | POA: Diagnosis not present

## 2019-11-21 DIAGNOSIS — Z1151 Encounter for screening for human papillomavirus (HPV): Secondary | ICD-10-CM | POA: Diagnosis not present

## 2019-11-21 DIAGNOSIS — Z1272 Encounter for screening for malignant neoplasm of vagina: Secondary | ICD-10-CM | POA: Diagnosis not present

## 2019-11-24 DIAGNOSIS — J301 Allergic rhinitis due to pollen: Secondary | ICD-10-CM | POA: Diagnosis not present

## 2019-11-24 DIAGNOSIS — J3081 Allergic rhinitis due to animal (cat) (dog) hair and dander: Secondary | ICD-10-CM | POA: Diagnosis not present

## 2019-11-24 DIAGNOSIS — J3089 Other allergic rhinitis: Secondary | ICD-10-CM | POA: Diagnosis not present

## 2019-11-28 DIAGNOSIS — J301 Allergic rhinitis due to pollen: Secondary | ICD-10-CM | POA: Diagnosis not present

## 2019-11-28 DIAGNOSIS — J3089 Other allergic rhinitis: Secondary | ICD-10-CM | POA: Diagnosis not present

## 2019-11-28 DIAGNOSIS — J3081 Allergic rhinitis due to animal (cat) (dog) hair and dander: Secondary | ICD-10-CM | POA: Diagnosis not present

## 2019-12-04 ENCOUNTER — Other Ambulatory Visit: Payer: Self-pay | Admitting: Medical

## 2019-12-04 MED FILL — NALTREXONE 50 MG TABLET: 50 | 90 days supply | Qty: 90 | Fill #1

## 2019-12-05 MED FILL — SERTRALINE HCL 100 MG TABS: 100 | 90 days supply | Qty: 90 | Fill #0

## 2019-12-08 DIAGNOSIS — J3089 Other allergic rhinitis: Secondary | ICD-10-CM | POA: Diagnosis not present

## 2019-12-08 DIAGNOSIS — J301 Allergic rhinitis due to pollen: Secondary | ICD-10-CM | POA: Diagnosis not present

## 2019-12-08 DIAGNOSIS — J3081 Allergic rhinitis due to animal (cat) (dog) hair and dander: Secondary | ICD-10-CM | POA: Diagnosis not present

## 2019-12-11 DIAGNOSIS — J301 Allergic rhinitis due to pollen: Secondary | ICD-10-CM | POA: Diagnosis not present

## 2019-12-12 DIAGNOSIS — J3089 Other allergic rhinitis: Secondary | ICD-10-CM | POA: Diagnosis not present

## 2019-12-12 DIAGNOSIS — J3081 Allergic rhinitis due to animal (cat) (dog) hair and dander: Secondary | ICD-10-CM | POA: Diagnosis not present

## 2019-12-19 DIAGNOSIS — J301 Allergic rhinitis due to pollen: Secondary | ICD-10-CM | POA: Diagnosis not present

## 2019-12-19 DIAGNOSIS — J3089 Other allergic rhinitis: Secondary | ICD-10-CM | POA: Diagnosis not present

## 2019-12-19 DIAGNOSIS — J3081 Allergic rhinitis due to animal (cat) (dog) hair and dander: Secondary | ICD-10-CM | POA: Diagnosis not present

## 2019-12-28 DIAGNOSIS — J301 Allergic rhinitis due to pollen: Secondary | ICD-10-CM | POA: Diagnosis not present

## 2019-12-28 DIAGNOSIS — J3089 Other allergic rhinitis: Secondary | ICD-10-CM | POA: Diagnosis not present

## 2019-12-28 DIAGNOSIS — J3081 Allergic rhinitis due to animal (cat) (dog) hair and dander: Secondary | ICD-10-CM | POA: Diagnosis not present

## 2019-12-29 MED FILL — VITAMIN D3 25 MCG (1000 UT): 25 MCG | 100 days supply | Qty: 100 | Fill #1

## 2020-01-03 DIAGNOSIS — J301 Allergic rhinitis due to pollen: Secondary | ICD-10-CM | POA: Diagnosis not present

## 2020-01-03 DIAGNOSIS — J3089 Other allergic rhinitis: Secondary | ICD-10-CM | POA: Diagnosis not present

## 2020-01-03 DIAGNOSIS — J3081 Allergic rhinitis due to animal (cat) (dog) hair and dander: Secondary | ICD-10-CM | POA: Diagnosis not present

## 2020-01-10 DIAGNOSIS — J3081 Allergic rhinitis due to animal (cat) (dog) hair and dander: Secondary | ICD-10-CM | POA: Diagnosis not present

## 2020-01-10 DIAGNOSIS — J3089 Other allergic rhinitis: Secondary | ICD-10-CM | POA: Diagnosis not present

## 2020-01-10 DIAGNOSIS — J301 Allergic rhinitis due to pollen: Secondary | ICD-10-CM | POA: Diagnosis not present

## 2020-01-16 MED FILL — AZELASTINE HCL 137 MCG/SPRA: 137 | 75 days supply | Qty: 90 | Fill #1

## 2020-01-22 DIAGNOSIS — Z124 Encounter for screening for malignant neoplasm of cervix: Secondary | ICD-10-CM | POA: Diagnosis not present

## 2020-01-22 DIAGNOSIS — R87619 Unspecified abnormal cytological findings in specimens from cervix uteri: Secondary | ICD-10-CM | POA: Diagnosis not present

## 2020-01-22 DIAGNOSIS — J301 Allergic rhinitis due to pollen: Secondary | ICD-10-CM | POA: Diagnosis not present

## 2020-01-22 DIAGNOSIS — J3089 Other allergic rhinitis: Secondary | ICD-10-CM | POA: Diagnosis not present

## 2020-01-22 DIAGNOSIS — J3081 Allergic rhinitis due to animal (cat) (dog) hair and dander: Secondary | ICD-10-CM | POA: Diagnosis not present

## 2020-01-22 LAB — HM PAP SMEAR

## 2020-01-26 MED FILL — LEVOCETIRIZINE 5 MG TABLET: 5 | 90 days supply | Qty: 90 | Fill #0

## 2020-01-26 MED FILL — MONTELUKAST SOD 10 MG TAB: 10 | 90 days supply | Qty: 90 | Fill #0

## 2020-01-31 DIAGNOSIS — J3081 Allergic rhinitis due to animal (cat) (dog) hair and dander: Secondary | ICD-10-CM | POA: Diagnosis not present

## 2020-01-31 DIAGNOSIS — J3089 Other allergic rhinitis: Secondary | ICD-10-CM | POA: Diagnosis not present

## 2020-01-31 DIAGNOSIS — J301 Allergic rhinitis due to pollen: Secondary | ICD-10-CM | POA: Diagnosis not present

## 2020-01-31 MED FILL — ALPRAZolam 0.5 MG TABS: 0.5 | 5 days supply | Qty: 5 | Fill #0

## 2020-02-07 DIAGNOSIS — D060 Carcinoma in situ of endocervix: Secondary | ICD-10-CM | POA: Diagnosis not present

## 2020-02-07 DIAGNOSIS — N879 Dysplasia of cervix uteri, unspecified: Secondary | ICD-10-CM | POA: Diagnosis not present

## 2020-02-07 DIAGNOSIS — R87619 Unspecified abnormal cytological findings in specimens from cervix uteri: Secondary | ICD-10-CM | POA: Diagnosis not present

## 2020-02-07 DIAGNOSIS — B977 Papillomavirus as the cause of diseases classified elsewhere: Secondary | ICD-10-CM | POA: Diagnosis not present

## 2020-02-09 DIAGNOSIS — J3089 Other allergic rhinitis: Secondary | ICD-10-CM | POA: Diagnosis not present

## 2020-02-09 DIAGNOSIS — J301 Allergic rhinitis due to pollen: Secondary | ICD-10-CM | POA: Diagnosis not present

## 2020-02-09 DIAGNOSIS — J3081 Allergic rhinitis due to animal (cat) (dog) hair and dander: Secondary | ICD-10-CM | POA: Diagnosis not present

## 2020-02-16 DIAGNOSIS — J3089 Other allergic rhinitis: Secondary | ICD-10-CM | POA: Diagnosis not present

## 2020-02-16 DIAGNOSIS — J3081 Allergic rhinitis due to animal (cat) (dog) hair and dander: Secondary | ICD-10-CM | POA: Diagnosis not present

## 2020-02-16 DIAGNOSIS — J301 Allergic rhinitis due to pollen: Secondary | ICD-10-CM | POA: Diagnosis not present

## 2020-02-23 DIAGNOSIS — J3089 Other allergic rhinitis: Secondary | ICD-10-CM | POA: Diagnosis not present

## 2020-02-23 DIAGNOSIS — J301 Allergic rhinitis due to pollen: Secondary | ICD-10-CM | POA: Diagnosis not present

## 2020-02-23 DIAGNOSIS — J3081 Allergic rhinitis due to animal (cat) (dog) hair and dander: Secondary | ICD-10-CM | POA: Diagnosis not present

## 2020-02-29 ENCOUNTER — Encounter (HOSPITAL_BASED_OUTPATIENT_CLINIC_OR_DEPARTMENT_OTHER): Payer: Self-pay | Admitting: Obstetrics and Gynecology

## 2020-02-29 ENCOUNTER — Other Ambulatory Visit: Payer: Self-pay

## 2020-02-29 DIAGNOSIS — J3089 Other allergic rhinitis: Secondary | ICD-10-CM | POA: Diagnosis not present

## 2020-02-29 DIAGNOSIS — J3081 Allergic rhinitis due to animal (cat) (dog) hair and dander: Secondary | ICD-10-CM | POA: Diagnosis not present

## 2020-02-29 DIAGNOSIS — J301 Allergic rhinitis due to pollen: Secondary | ICD-10-CM | POA: Diagnosis not present

## 2020-02-29 NOTE — Progress Notes (Addendum)
Spoke w/ via phone for pre-op interview--pt- Lab needs dos----   Cbc t & s   , urine poct         Lab results------none COVID test ------03-04-2020 800 Arrive at -------530 am 03-05-2020 NPO after MN NO Solid Food.  Clear liquids from MN until---430 am then npo Medications to take morning of surgery -----sertraline, nasal sprays, famotidine, xanaax prn Diabetic medication ----n/a- Patient Special Instructions ----none- Pre-Op special Istructions -----none Patient verbalized understanding of instructions that were given at this phone interview. Patient denies shortness of breath, chest pain, fever, cough at this phone interview.

## 2020-03-01 ENCOUNTER — Other Ambulatory Visit (HOSPITAL_COMMUNITY): Payer: 59

## 2020-03-04 ENCOUNTER — Other Ambulatory Visit (HOSPITAL_COMMUNITY)
Admission: RE | Admit: 2020-03-04 | Discharge: 2020-03-04 | Disposition: A | Discharge status: Home / Self Care | Payer: 59 | Source: Ambulatory Visit | Attending: Obstetrics and Gynecology | Admitting: Obstetrics and Gynecology

## 2020-03-04 ENCOUNTER — Other Ambulatory Visit: Payer: Self-pay | Admitting: Medical

## 2020-03-04 ENCOUNTER — Encounter (HOSPITAL_BASED_OUTPATIENT_CLINIC_OR_DEPARTMENT_OTHER): Payer: Self-pay | Admitting: Obstetrics and Gynecology

## 2020-03-04 ENCOUNTER — Telehealth: Payer: Self-pay

## 2020-03-04 DIAGNOSIS — Z20822 Contact with and (suspected) exposure to covid-19: Secondary | ICD-10-CM | POA: Diagnosis not present

## 2020-03-04 DIAGNOSIS — Z01812 Encounter for preprocedural laboratory examination: Secondary | ICD-10-CM | POA: Insufficient documentation

## 2020-03-04 LAB — SARS CORONAVIRUS 2 (TAT 6-24 HRS): SARS Coronavirus 2: NEGATIVE

## 2020-03-04 MED ORDER — NALTREXONE HCL 50 MG PO TABS: 50.0000 mg | ORAL_TABLET | Freq: Every day | ORAL | 1 refills | Status: DC

## 2020-03-04 MED FILL — NALTREXONE 50 MG TABLET: 50 | 90 days supply | Qty: 90 | Fill #0

## 2020-03-04 NOTE — Anesthesia Preprocedure Evaluation (Addendum)
Anesthesia Evaluation  Patient identified by MRN, date of birth, ID band  Reviewed: Allergy & Precautions, NPO status , Patient's Chart, lab work & pertinent test results  Airway Mallampati: II  TM Distance: >3 FB Neck ROM: Full    Dental no notable dental hx.    Pulmonary asthma (mild) ,  H/o COVID 05/2019   Pulmonary exam normal breath sounds clear to auscultation       Cardiovascular Exercise Tolerance: Good negative cardio ROS Normal cardiovascular exam Rhythm:Regular Rate:Normal     Neuro/Psych  Headaches, PSYCHIATRIC DISORDERS Depression    GI/Hepatic Neg liver ROS, GERD  ,  Endo/Other  BMI 34  Renal/GU negative Renal ROS  negative genitourinary   Musculoskeletal  (+) Arthritis ,   Abdominal   Peds negative pediatric ROS (+)  Hematology  (+) anemia ,   Anesthesia Other Findings   Reproductive/Obstetrics                            Anesthesia Physical Anesthesia Plan  ASA: II  Anesthesia Plan: MAC   Post-op Pain Management:    Induction:   PONV Risk Score and Plan: 2 and Propofol infusion, TIVA, Treatment may vary due to age or medical condition and Midazolam  Airway Management Planned: Natural Airway and Simple Face Mask  Additional Equipment:   Intra-op Plan:   Post-operative Plan:   Informed Consent: I have reviewed the patients History and Physical, chart, labs and discussed the procedure including the risks, benefits and alternatives for the proposed anesthesia with the patient or authorized representative who has indicated his/her understanding and acceptance.       Plan Discussed with: CRNA, Anesthesiologist and Surgeon  Anesthesia Plan Comments: (Propofol gtt + natural airway. GA/LMA as backup plan. )       Anesthesia Quick Evaluation

## 2020-03-04 NOTE — Telephone Encounter (Signed)
Received fax from [facility]. Pharmacy for a refill on the pts. Naltrexone 50 mg last apt. Was 09/08/19

## 2020-03-04 NOTE — H&P (Signed)
Michelle Griffin is an 49 y.o. female G2P2 here for cervical conization with adenocarcinoma in situ noted on recent colposcopy.  Plan is ultimate LAVH/BS but we will do conization to r/o any invasive disease.  She is s/p endometrial ablation in 2017 and has no menses.  Husband has had a vasectomy.      Pertinent Gynecological History:  Last pap: abnormal: 01/22/20 AGUS/HPV + OB History: G2P2    Menstrual History:  No LMP recorded. Patient has had an ablation.    Past Medical History:  Diagnosis Date  . Adenocarcinoma in situ of cervix   . Allergy    sees [name]  . Anemia    hx/o iron deficiency due to heavy periods  . Asthma    exercise induced  . Bursitis   . Cancer ([place])   . COVID-19 05/26/2019   fatigue, h/a, loss of taste and smell, fatige x 1 month, loss of taste x 5 months all symptoms rsolved  . DDD (degenerative disc disease), lumbar   . Fatty liver   . GERD (gastroesophageal reflux disease)    [name]  . H/O mammogram   . Hiatal hernia    small per EGD, [name]  . Hyperlipidemia 2012   was on pravastatin prior  . Migraine    prior eval with neurologist, Dr. Melton Alar  . Overactive bladder   . PMDD (premenstrual dysphoric disorder)   . Pyelonephritis    age 61yo  . Uterine infection 2015  . Vitamin D deficiency   . Wears glasses     Past Surgical History:  Procedure Laterality Date  . CHOLECYSTECTOMY  2011   [facility]  . COLONOSCOPY  2014   repeat 5 years; [name], baseline  . ESOPHAGOGASTRODUODENOSCOPY  2014   [name]  . FOOT SURGERY Left 2011   morton's neuroma  . LASIK  yrs ago   [name]  . LUMBAR DISC SURGERY  07/2013, [facility]   due to herniated disc, [name]  . [place]  2015  . WISDOM TOOTH EXTRACTION  age 68    Family History  Problem Relation Age of Onset  . Lupus Mother   . Hypertension Mother   . Hyperlipidemia Mother   . Migraines Mother   . Kidney disease Mother   . Hypertension Father   .  Diabetes Father   . Hyperlipidemia Father   . Depression Father   . Anxiety disorder Father   . Dementia Father   . Obesity Sister   . Hypertension Sister   . Diabetes Sister   . Heart disease Paternal Uncle        CABG  . Hyperlipidemia Paternal Uncle   . Cancer Paternal Uncle        multiple with colon cancer  . Cancer Cousin        breast  . Cancer Cousin        ovarian  . Ovarian cysts Daughter   . Stroke Neg Hx     Social History:  reports that she has never smoked. She has never used smokeless tobacco. She reports current alcohol use of about 3.0 standard drinks of alcohol per week. She reports that she does not use drugs.  Allergies:  Allergies  Allergen Reactions  . Sulfa Antibiotics Hives  . Percocet [Oxycodone-Acetaminophen]     Oxycodone " makes me climb the wall, itching"    No medications prior to admission.    Review of Systems  Gastrointestinal:  Negative for abdominal pain.  Genitourinary: Negative for dyspareunia, menstrual problem, pelvic pain and vaginal bleeding.    Height 5\' 10"  (1.778 m), weight 104.1 kg. Physical Exam Cardiovascular:     Rate and Rhythm: Normal rate and regular rhythm.  Pulmonary:     Effort: Pulmonary effort is normal.  Abdominal:     Palpations: Abdomen is soft.  Genitourinary:    General: Normal vulva.  Neurological:     Mental Status: She is alert.  Psychiatric:        Mood and Affect: Mood normal.     Results for orders placed or performed during the hospital encounter of 03/04/20 (from the past 24 hour(s))  SARS CORONAVIRUS 2 (TAT 6-24 HRS) Nasopharyngeal Nasopharyngeal Swab     Status: None   Collection Time: 03/04/20  8:21 AM   Specimen: Nasopharyngeal Swab  Result Value Ref Range   SARS Coronavirus 2 NEGATIVE NEGATIVE    No results found.  Assessment/Plan: Pt counseled on conization procedure in detail and risks/benefits reviewed with her.  We discussed bleeding and infection as possible risks.  She  understands and is ready to proceed.   Logan Bores 03/04/2020, 6:07 PM

## 2020-03-05 ENCOUNTER — Encounter (HOSPITAL_BASED_OUTPATIENT_CLINIC_OR_DEPARTMENT_OTHER)
Admission: RE | Disposition: A | Discharge status: Home / Self Care | Payer: Self-pay | Source: Home / Self Care | Attending: Obstetrics and Gynecology

## 2020-03-05 ENCOUNTER — Other Ambulatory Visit (HOSPITAL_BASED_OUTPATIENT_CLINIC_OR_DEPARTMENT_OTHER): Payer: Self-pay | Admitting: Obstetrics and Gynecology

## 2020-03-05 ENCOUNTER — Ambulatory Visit (HOSPITAL_BASED_OUTPATIENT_CLINIC_OR_DEPARTMENT_OTHER): Payer: 59 | Admitting: Anesthesiology

## 2020-03-05 ENCOUNTER — Ambulatory Visit (HOSPITAL_BASED_OUTPATIENT_CLINIC_OR_DEPARTMENT_OTHER)
Admission: RE | Admit: 2020-03-05 | Discharge: 2020-03-05 | Disposition: A | Discharge status: Home / Self Care | Payer: 59 | Attending: Obstetrics and Gynecology | Admitting: Obstetrics and Gynecology

## 2020-03-05 ENCOUNTER — Encounter (HOSPITAL_BASED_OUTPATIENT_CLINIC_OR_DEPARTMENT_OTHER): Payer: Self-pay | Admitting: Obstetrics and Gynecology

## 2020-03-05 DIAGNOSIS — Z803 Family history of malignant neoplasm of breast: Secondary | ICD-10-CM | POA: Insufficient documentation

## 2020-03-05 DIAGNOSIS — E559 Vitamin D deficiency, unspecified: Secondary | ICD-10-CM | POA: Diagnosis not present

## 2020-03-05 DIAGNOSIS — Z885 Allergy status to narcotic agent status: Secondary | ICD-10-CM | POA: Insufficient documentation

## 2020-03-05 DIAGNOSIS — Z8041 Family history of malignant neoplasm of ovary: Secondary | ICD-10-CM | POA: Diagnosis not present

## 2020-03-05 DIAGNOSIS — D069 Carcinoma in situ of cervix, unspecified: Secondary | ICD-10-CM | POA: Diagnosis not present

## 2020-03-05 DIAGNOSIS — E785 Hyperlipidemia, unspecified: Secondary | ICD-10-CM | POA: Diagnosis not present

## 2020-03-05 DIAGNOSIS — Z882 Allergy status to sulfonamides status: Secondary | ICD-10-CM | POA: Insufficient documentation

## 2020-03-05 DIAGNOSIS — Z9049 Acquired absence of other specified parts of digestive tract: Secondary | ICD-10-CM | POA: Diagnosis not present

## 2020-03-05 DIAGNOSIS — Z8616 Personal history of COVID-19: Secondary | ICD-10-CM | POA: Diagnosis not present

## 2020-03-05 DIAGNOSIS — D060 Carcinoma in situ of endocervix: Secondary | ICD-10-CM | POA: Diagnosis not present

## 2020-03-05 DIAGNOSIS — G4700 Insomnia, unspecified: Secondary | ICD-10-CM | POA: Diagnosis not present

## 2020-03-05 DIAGNOSIS — C530 Malignant neoplasm of endocervix: Secondary | ICD-10-CM | POA: Insufficient documentation

## 2020-03-05 DIAGNOSIS — Z800 Family history of malignant neoplasm of digestive organs: Secondary | ICD-10-CM | POA: Insufficient documentation

## 2020-03-05 DIAGNOSIS — Z8249 Family history of ischemic heart disease and other diseases of the circulatory system: Secondary | ICD-10-CM | POA: Insufficient documentation

## 2020-03-05 DIAGNOSIS — K760 Fatty (change of) liver, not elsewhere classified: Secondary | ICD-10-CM | POA: Diagnosis not present

## 2020-03-05 DIAGNOSIS — J4530 Mild persistent asthma, uncomplicated: Secondary | ICD-10-CM | POA: Diagnosis not present

## 2020-03-05 DIAGNOSIS — Z8349 Family history of other endocrine, nutritional and metabolic diseases: Secondary | ICD-10-CM | POA: Diagnosis not present

## 2020-03-05 HISTORY — PX: CERVICAL CONIZATION W/BX: SHX1330

## 2020-03-05 HISTORY — DX: Malignant (primary) neoplasm, unspecified: C80.1

## 2020-03-05 HISTORY — DX: Carcinoma in situ of cervix, unspecified: D06.9

## 2020-03-05 LAB — CBC
HCT: 38.8 % (ref 36.0–46.0)
Hemoglobin: 12.4 g/dL (ref 12.0–15.0)
MCH: 27.8 pg (ref 26.0–34.0)
MCHC: 32 g/dL (ref 30.0–36.0)
MCV: 87 fL (ref 80.0–100.0)
Platelets: 260 10*3/uL (ref 150–400)
RBC: 4.46 MIL/uL (ref 3.87–5.11)
RDW: 13.5 % (ref 11.5–15.5)
WBC: 5.3 10*3/uL (ref 4.0–10.5)
nRBC: 0 % (ref 0.0–0.2)

## 2020-03-05 LAB — TYPE AND SCREEN
ABO/RH(D): O NEG
Antibody Screen: NEGATIVE

## 2020-03-05 LAB — POCT PREGNANCY, URINE: Preg Test, Ur: NEGATIVE

## 2020-03-05 LAB — ABO/RH: ABO/RH(D): O NEG

## 2020-03-05 SURGERY — CONE BIOPSY, CERVIX
Anesthesia: General

## 2020-03-05 MED ORDER — MIDAZOLAM HCL 2 MG/2ML IJ SOLN
INTRAMUSCULAR | Status: AC
  Filled 2020-03-05: qty 2

## 2020-03-05 MED ORDER — FENTANYL CITRATE (PF) 100 MCG/2ML IJ SOLN
INTRAMUSCULAR | Status: DC | PRN
  Administered 2020-03-05 (×2): 50 ug via INTRAVENOUS

## 2020-03-05 MED ORDER — ARTIFICIAL TEARS OPHTHALMIC OINT
TOPICAL_OINTMENT | OPHTHALMIC | Status: AC
  Filled 2020-03-05: qty 3.5

## 2020-03-05 MED ORDER — DEXAMETHASONE SODIUM PHOSPHATE 10 MG/ML IJ SOLN
INTRAMUSCULAR | Status: AC
  Filled 2020-03-05: qty 1

## 2020-03-05 MED ORDER — LACTATED RINGERS IV SOLN: INTRAVENOUS | Status: DC

## 2020-03-05 MED ORDER — EPHEDRINE 5 MG/ML INJ
INTRAVENOUS | Status: AC
  Filled 2020-03-05: qty 10

## 2020-03-05 MED ORDER — LIDOCAINE HCL 1 % IJ SOLN
INTRAMUSCULAR | Status: DC | PRN
  Administered 2020-03-05: 20 mL

## 2020-03-05 MED ORDER — FENTANYL CITRATE (PF) 100 MCG/2ML IJ SOLN
INTRAMUSCULAR | Status: AC
  Filled 2020-03-05: qty 2

## 2020-03-05 MED ORDER — HYDROCODONE-ACETAMINOPHEN 5-325 MG PO TABS
1.0000 | ORAL_TABLET | Freq: Four times a day (QID) | ORAL | 0 refills | Status: DC | PRN
Start: 2020-03-05 — End: 2020-03-21

## 2020-03-05 MED ORDER — PROPOFOL 10 MG/ML IV BOLUS
INTRAVENOUS | Status: AC
  Filled 2020-03-05: qty 20

## 2020-03-05 MED ORDER — PROPOFOL 500 MG/50ML IV EMUL
INTRAVENOUS | Status: DC | PRN
  Administered 2020-03-05: 200 ug/kg/min via INTRAVENOUS

## 2020-03-05 MED ORDER — KETOROLAC TROMETHAMINE 30 MG/ML IJ SOLN: INTRAMUSCULAR | Status: DC | PRN

## 2020-03-05 MED ORDER — MIDAZOLAM HCL 2 MG/2ML IJ SOLN
INTRAMUSCULAR | Status: DC | PRN
  Administered 2020-03-05 (×2): 1 mg via INTRAVENOUS

## 2020-03-05 MED ORDER — LIDOCAINE HCL (CARDIAC) PF 100 MG/5ML IV SOSY
PREFILLED_SYRINGE | INTRAVENOUS | Status: DC | PRN
  Administered 2020-03-05: 100 mg via INTRAVENOUS

## 2020-03-05 MED ORDER — LIDOCAINE 2% (20 MG/ML) 5 ML SYRINGE
INTRAMUSCULAR | Status: AC
  Filled 2020-03-05: qty 5

## 2020-03-05 MED ORDER — KETOROLAC TROMETHAMINE 30 MG/ML IJ SOLN
INTRAMUSCULAR | Status: AC
  Filled 2020-03-05: qty 1

## 2020-03-05 MED ORDER — PROPOFOL 500 MG/50ML IV EMUL
INTRAVENOUS | Status: AC
  Filled 2020-03-05: qty 50

## 2020-03-05 MED ORDER — ONDANSETRON HCL 4 MG/2ML IJ SOLN
INTRAMUSCULAR | Status: AC
  Filled 2020-03-05: qty 2

## 2020-03-05 MED ORDER — FENTANYL CITRATE (PF) 100 MCG/2ML IJ SOLN: 25.0000 ug | INTRAMUSCULAR | Status: DC | PRN

## 2020-03-05 MED ORDER — POVIDONE-IODINE 10 % EX SWAB: 2.0000 "application " | Freq: Once | CUTANEOUS | Status: DC

## 2020-03-05 MED ORDER — GLYCOPYRROLATE PF 0.2 MG/ML IJ SOSY
PREFILLED_SYRINGE | INTRAMUSCULAR | Status: AC
  Filled 2020-03-05: qty 1

## 2020-03-05 MED ORDER — SODIUM CHLORIDE 0.9 % IR SOLN
Status: DC | PRN
  Administered 2020-03-05: 500 mL

## 2020-03-05 MED ORDER — ONDANSETRON HCL 4 MG/2ML IJ SOLN
INTRAMUSCULAR | Status: DC | PRN
  Administered 2020-03-05: 4 mg via INTRAVENOUS

## 2020-03-05 MED ORDER — PROMETHAZINE HCL 25 MG/ML IJ SOLN: 6.2500 mg | INTRAMUSCULAR | Status: DC | PRN

## 2020-03-05 MED ORDER — PROPOFOL 10 MG/ML IV BOLUS
INTRAVENOUS | Status: DC | PRN
  Administered 2020-03-05: 200 mg via INTRAVENOUS

## 2020-03-05 MED ORDER — DEXAMETHASONE SODIUM PHOSPHATE 4 MG/ML IJ SOLN
INTRAMUSCULAR | Status: DC | PRN
  Administered 2020-03-05: 10 mg via INTRAVENOUS

## 2020-03-05 MED FILL — HYDROCODON-APAP 5-325: 5-325 | 3 days supply | Qty: 12 | Fill #0

## 2020-03-05 SURGICAL SUPPLY — 23 items
APL SWBSTK 6 STRL LF DISP (MISCELLANEOUS) ×1
APPLICATOR COTTON TIP 6 STRL (MISCELLANEOUS) IMPLANT
APPLICATOR COTTON TIP 6IN STRL (MISCELLANEOUS) ×2
BLADE SURG 15 STRL LF DISP TIS (BLADE) ×1 IMPLANT
BLADE SURG 15 STRL SS (BLADE) ×2
BLADE SURG SZ11 CARB STEEL (BLADE) ×1 IMPLANT
CANISTER SUCT 3000ML PPV (MISCELLANEOUS) ×2 IMPLANT
GLOVE BIO SURGEON STRL SZ 6.5 (GLOVE) ×2 IMPLANT
GLOVE BIOGEL PI IND STRL 7.0 (GLOVE) ×1 IMPLANT
GLOVE BIOGEL PI INDICATOR 7.0 (GLOVE) ×1
GOWN STRL REUS W/ TWL LRG LVL3 (GOWN DISPOSABLE) ×2 IMPLANT
GOWN STRL REUS W/TWL LRG LVL3 (GOWN DISPOSABLE) ×4
NS IRRIG 1000ML POUR BTL (IV SOLUTION) ×2 IMPLANT
PACK VAGINAL WOMENS (CUSTOM PROCEDURE TRAY) ×2 IMPLANT
PAD OB MATERNITY 4.3X12.25 (PERSONAL CARE ITEMS) ×2 IMPLANT
PENCIL SMOKE EVACUATOR (MISCELLANEOUS) ×1 IMPLANT
SCOPETTES 8  STERILE (MISCELLANEOUS) ×2
SCOPETTES 8 STERILE (MISCELLANEOUS) ×2 IMPLANT
SPONGE SURGIFOAM ABS GEL 12-7 (HEMOSTASIS) IMPLANT
SUT VIC AB 0 CT1 27 (SUTURE) ×6
SUT VIC AB 0 CT1 27XBRD ANBCTR (SUTURE) ×2 IMPLANT
SUT VIC AB 2-0 UR6 27 (SUTURE) ×1 IMPLANT
TOWEL OR 17X26 10 PK STRL BLUE (TOWEL DISPOSABLE) ×3 IMPLANT

## 2020-03-05 NOTE — Progress Notes (Signed)
Patient ID: Michelle Griffin, female   DOB: 18-Feb-1971, 49 y.o.   MRN: [id number] Per pt no changes in dictated H&P, ready to proceed.Brief exam WNL

## 2020-03-05 NOTE — Transfer of Care (Signed)
Immediate Anesthesia Transfer of Care Note  Patient: Michelle Griffin  Procedure(s) Performed: Procedure(s) (LRB): CONIZATION CERVIX WITH BIOPSY (N/A)  Patient Location: PACU  Anesthesia Type: General  Level of Consciousness: awake, oriented, sedated and patient cooperative  Airway & Oxygen Therapy: Patient Spontanous Breathing and Patient connected to face mask oxygen  Post-op Assessment: Report given to PACU RN and Post -op Vital signs reviewed and stable  Post vital signs: Reviewed and stable  Complications: No apparent anesthesia complications Last Vitals:  Vitals Value Taken Time  BP 107/73 03/05/20 0824  Temp 36.7 C 03/05/20 0824  Pulse 71 03/05/20 0825  Resp 12 03/05/20 0825  SpO2 97 % 03/05/20 0825  Vitals shown include unvalidated device data.  Last Pain:  Vitals:   03/05/20 0615  TempSrc: Oral  PainSc: 0-No pain      Patients Stated Pain Goal: 5 (64/31/42 7670)  Complications: No complications documented.

## 2020-03-05 NOTE — Discharge Instructions (Signed)

## 2020-03-05 NOTE — Anesthesia Procedure Notes (Signed)
Procedure Name: LMA Insertion Date/Time: 03/05/2020 7:39 AM Performed by: Mechele Claude, CRNA Pre-anesthesia Checklist: Patient identified, Emergency Drugs available, Suction available and Patient being monitored Patient Re-evaluated:Patient Re-evaluated prior to induction Oxygen Delivery Method: Circle system utilized Preoxygenation: Pre-oxygenation with 100% oxygen Induction Type: IV induction Ventilation: Mask ventilation without difficulty LMA: LMA inserted LMA Size: 4.0 Number of attempts: 1 Airway Equipment and Method: Bite block Placement Confirmation: positive ETCO2 Tube secured with: Tape Dental Injury: Teeth and Oropharynx as per pre-operative assessment

## 2020-03-05 NOTE — Anesthesia Postprocedure Evaluation (Signed)
Anesthesia Post Note  Patient: Michelle Griffin  Procedure(s) Performed: CONIZATION CERVIX WITH BIOPSY (N/A )     Patient location during evaluation: PACU Anesthesia Type: General Level of consciousness: awake and alert Pain management: pain level controlled Vital Signs Assessment: post-procedure vital signs reviewed and stable Respiratory status: spontaneous breathing, nonlabored ventilation and respiratory function stable Cardiovascular status: blood pressure returned to baseline and stable Postop Assessment: no apparent nausea or vomiting Anesthetic complications: no   No complications documented.  Last Vitals:  Vitals:   03/05/20 0900 03/05/20 0930  BP: 122/73 124/77  Pulse: 65 (!) 59  Resp: 17 16  Temp:  36.5 C  SpO2: 97% 100%    Last Pain:  Vitals:   03/05/20 0930  TempSrc:   PainSc: 0-No pain                 Merlinda Frederick

## 2020-03-05 NOTE — Op Note (Signed)
Operative Note    Preoperative Diagnosis Adenocarcinoma in situ of cervix on colposcopy biopsies  Postoperative Diagnosis Same  Procedure Cold knife conization of cervix  Surgeon Paula Compton, MD  Anesthesia LMA  Fluids: EBL 74mL IVF 772mL  Findings Cervix was multiparous, but normal in appearance and was not barrel-shaped or distorted.  A generous cone was taken of the ectocervix and endocervical canal  Specimen Cervical cone   Procedure Note The patient was taken to the operating room where LMA anesthesia was obtained without difficulty.  She was prepped and draped in the normal sterile fashion in the dorsal lithotomy position.  A speculum was placed in the vagina and the cervix infiltrated with 41mL of 1% plain lidocaine.  The anterior lip of the cervix was grasped with a single tooth tenaculum and stay sutures of 0 vicryl placed at 9 o'clock and 3 o'clock.  The 11 blade was then used to circumferentially excise a cone of tissue from the ectocervical margin laterally to the endocervical canal to a depth of 1.5 to 2 cm.  This was handed off to pathology.  The cone bed was then sutured with a running suture in-and-out of the bed circumferentially and held.  The deeper bed bas was additionally sutured with interrupted figure of eight sutures of 2-0 vicryl on a UR needle to render hemostasis.  The circumferential suture was then tied down and good hemostasis noted.  A few areas of superficial oozing were controlled with bovie cautery.   The cervix was inspected and found to be hemostatic after several minutes of observation.  All instruments and sponges were removed after the stay sutures were trimmed.  The patient was taken to the recovery room in good condition.

## 2020-03-06 ENCOUNTER — Encounter (HOSPITAL_BASED_OUTPATIENT_CLINIC_OR_DEPARTMENT_OTHER): Payer: Self-pay | Admitting: Obstetrics and Gynecology

## 2020-03-06 LAB — SURGICAL PATHOLOGY

## 2020-03-11 NOTE — Progress Notes (Addendum)
COVID Vaccine Completed: x2 Date COVID Vaccine completed:  07-28-19 & 08-18-19 COVID vaccine manufacturer: [employer]   PCP - Chana Bode, PA-C  Cardiologist -   Chest x-ray -  EKG -  Stress Test -  ECHO -  Cardiac Cath -  Pacemaker/ICD device last checked:  Sleep Study -  CPAP -   Fasting Blood Sugar -  Checks Blood Sugar _____ times a day  Blood Thinner Instructions: Aspirin Instructions: Last Dose:  Anesthesia review:   Patient denies shortness of breath, fever, cough and chest pain at PAT appointment   Patient verbalized understanding of instructions that were given to them at the PAT appointment. Patient was also instructed that they will need to review over the PAT instructions again at home before surgery.

## 2020-03-11 NOTE — Patient Instructions (Addendum)
DUE TO COVID-19 ONLY ONE VISITOR IS ALLOWED IN WAITING ROOM (VISITOR WILL HAVE A TEMPERATURE CHECK ON ARRIVAL AND MUST WEAR A FACE MASK THE ENTIRE TIME.)  ONCE YOU ARE ADMITTED TO YOUR PRIVATE ROOM, THE SAME ONE VISITOR IS ALLOWED TO VISIT DURING VISITING HOURS ONLY.  Your COVID swab testing is scheduled for Saturday, 03-16-20 at  , You must self quarantine after your testing per handout given to you at the testing site. [address] [place], [place] [address]  (Must self quarantine after testing. Follow instructions on handout.)       Your procedure is scheduled on: Wednesday, 04-20-20  Report to [facility] AT 5:30 AM.   Call this number if you have problems the morning of surgery:[phone].   OUR ADDRESS IS [address].  WE ARE LOCATED IN [facility].                                     REMEMBER:  DO NOT EAT FOOD AFTER MIDNIGHT .    YOU MAY HAVE CLEAR LIQUIDS FROM MIDNIGHT UNTIL 4:30 A.M.  CLEAR LIQUID DIET  Foods Allowed                                                                     Water, Black Coffee and tea, regular and decaf             Plain Jell-O in any flavor  (No red)                                                                      Iced Popsicles (No red)                                                            Apple juices Sports drinks like Gatorade (No red) Lightly seasoned clear broth or consume(fat free) Sugar, honey syrup    BRUSH YOUR TEETH THE MORNING OF SURGERY.  TAKE THESE MEDICATIONS MORNING OF SURGERY WITH A SIP OF WATER:  Famotidine, Sertraline, okay to use nasal spray  DO NOT WEAR JEWERLY, MAKE UP, OR NAIL POLISH.  DO NOT WEAR LOTIONS, POWDERS, PERFUMES/COLOGNE OR DEODORANT.  DO NOT SHAVE FOR 24 HOURS PRIOR TO DAY OF SURGERY.  CONTACTS, GLASSES, OR DENTURES MAY NOT BE WORN TO SURGERY.                                    [facility] IS NOT RESPONSIBLE  FOR ANY BELONGINGS.  [facility] - Preparing for Surgery Before surgery, you can play an important role.  Because skin is not sterile, your skin needs to be as free of germs as possible.  You can reduce the number of germs on your skin by washing with CHG (chlorahexidine gluconate) soap before surgery.  CHG is an antiseptic cleaner which kills germs and bonds with the skin to continue killing germs even after washing. Please DO NOT use if you have an allergy to CHG or antibacterial soaps.  If your skin becomes reddened/irritated stop using the CHG and inform your nurse when you arrive at Short Stay. Do not shave (including legs and underarms) for at least 48 hours prior to the first CHG shower.  You may shave your face/neck.  Please follow these instructions carefully:  1.  Shower with CHG Soap the night before surgery and the  morning of surgery.  2.  If you choose to wash your hair, wash your hair first as usual with your normal  shampoo.  3.  After you shampoo, rinse your hair and body thoroughly to remove the shampoo.                             4.  Use CHG as you would any other liquid soap.  You can apply chg directly to the skin and wash.  Gently with a scrungie or clean washcloth.  5.  Apply the CHG Soap to your body ONLY FROM THE NECK DOWN.   Do   not use on face/ open                           Wound or open sores. Avoid contact with eyes, ears mouth and   genitals (private parts).                       Wash face,  Genitals (private parts) with your normal soap.             6.  Wash thoroughly, paying special attention to the area where your    surgery  will be performed.  7.  Thoroughly rinse your body with warm water from the neck down.  8.  DO NOT shower/wash with your normal soap after using and rinsing off the CHG Soap.                9.  Pat yourself dry with a clean towel.            10.  Wear clean pajamas.            11.  Place clean sheets on your bed  the night of your first shower and do not  sleep with pets. Day of Surgery : Do not apply any lotions/deodorants the morning of surgery.  Please wear clean clothes to the hospital/surgery center.  FAILURE TO FOLLOW THESE INSTRUCTIONS MAY RESULT IN THE CANCELLATION OF YOUR SURGERY  PATIENT SIGNATURE_________________________________  NURSE SIGNATURE__________________________________  ________________________________________________________________________     Adam Phenix  An incentive spirometer is a tool that can help keep your lungs clear and active. This tool measures how well you are filling your lungs with each breath. Taking long deep breaths may help reverse or decrease the chance of developing breathing (pulmonary) problems (especially infection) following:  A long period of time when you are unable to move or be active. BEFORE THE PROCEDURE  If the spirometer includes an indicator to show your best effort, your nurse or respiratory therapist will set it to a desired goal.  If possible, sit up straight or lean slightly forward. Try not to slouch.  Hold the incentive spirometer in an upright position. INSTRUCTIONS FOR USE  1. Sit on the edge of your bed if possible, or sit up as far as you can in bed or on a chair. 2. Hold the incentive spirometer in an upright position. 3. Breathe out normally. 4. Place the mouthpiece in your mouth and seal your lips tightly around it. 5. Breathe in slowly and as deeply as possible, raising the piston or the ball toward the top of the column. 6. Hold your breath for 3-5 seconds or for as long as possible. Allow the piston or ball to fall to the bottom of the column. 7. Remove the mouthpiece from your mouth and breathe out normally. 8. Rest for a few seconds and repeat Steps 1 through 7 at least 10 times every 1-2 hours when you are awake. Take your time and take a few normal breaths between deep breaths. 9. The spirometer may  include an indicator to show your best effort. Use the indicator as a goal to work toward during each repetition. 10. After each set of 10 deep breaths, practice coughing to be sure your lungs are clear. If you have an incision (the cut made at the time of surgery), support your incision when coughing by placing a pillow or rolled up towels firmly against it. Once you are able to get out of bed, walk around indoors and cough well. You may stop using the incentive spirometer when instructed by your caregiver.  RISKS AND COMPLICATIONS  Take your time so you do not get dizzy or light-headed.  If you are in pain, you may need to take or ask for pain medication before doing incentive spirometry. It is harder to take a deep breath if you are having pain. AFTER USE  Rest and breathe slowly and easily.  It can be helpful to keep track of a log of your progress. Your caregiver can provide you with a simple table to help with this. If you are using the spirometer at home, follow these instructions: [place] IF:   You are having difficultly using the spirometer.  You have trouble using the spirometer as often as instructed.  Your pain medication is not giving enough relief while using the spirometer.  You develop fever of 100.5 F (38.1 C) or higher. SEEK IMMEDIATE MEDICAL CARE IF:   You cough up bloody sputum that had not been present before.  You develop fever of 102 F (38.9 C) or greater.  You develop worsening pain at or near the incision site. MAKE SURE YOU:   Understand these instructions.  Will watch your condition.  Will get help right away if you are not doing well or get worse. Document Released: 09/28/2006 Document Revised: 08/10/2011 Document Reviewed: 11/29/2006 ExitCare Patient Information 2014 ExitCare, [place].   ________________________________________________________________________  WHAT IS A BLOOD TRANSFUSION? Blood Transfusion Information  A  transfusion is the replacement of blood or some of its parts. Blood is made up of multiple cells which provide different functions.  Red blood cells carry oxygen and are used for blood loss replacement.  White blood cells fight against infection.  Platelets control bleeding.  Plasma helps clot blood.  Other blood products are available for specialized needs, such as hemophilia or other clotting  disorders. BEFORE THE TRANSFUSION  Who gives blood for transfusions?   Healthy volunteers who are fully evaluated to make sure their blood is safe. This is blood bank blood. Transfusion therapy is the safest it has ever been in the practice of medicine. Before blood is taken from a donor, a complete history is taken to make sure that person has no history of diseases nor engages in risky social behavior (examples are intravenous drug use or sexual activity with multiple partners). The donor's travel history is screened to minimize risk of transmitting infections, such as malaria. The donated blood is tested for signs of infectious diseases, such as HIV and hepatitis. The blood is then tested to be sure it is compatible with you in order to minimize the chance of a transfusion reaction. If you or a relative donates blood, this is often done in anticipation of surgery and is not appropriate for emergency situations. It takes many days to process the donated blood. RISKS AND COMPLICATIONS Although transfusion therapy is very safe and saves many lives, the main dangers of transfusion include:   Getting an infectious disease.  Developing a transfusion reaction. This is an allergic reaction to something in the blood you were given. Every precaution is taken to prevent this. The decision to have a blood transfusion has been considered carefully by your caregiver before blood is given. Blood is not given unless the benefits outweigh the risks. AFTER THE TRANSFUSION  Right after receiving a blood transfusion,  you will usually feel much better and more energetic. This is especially true if your red blood cells have gotten low (anemic). The transfusion raises the level of the red blood cells which carry oxygen, and this usually causes an energy increase.  The nurse administering the transfusion will monitor you carefully for complications. HOME CARE INSTRUCTIONS  No special instructions are needed after a transfusion. You may find your energy is better. Speak with your caregiver about any limitations on activity for underlying diseases you may have. SEEK MEDICAL CARE IF:   Your condition is not improving after your transfusion.  You develop redness or irritation at the intravenous (IV) site. SEEK IMMEDIATE MEDICAL CARE IF:  Any of the following symptoms occur over the next 12 hours:  Shaking chills.  You have a temperature by mouth above 102 F (38.9 C), not controlled by medicine.  Chest, back, or muscle pain.  People around you feel you are not acting correctly or are confused.  Shortness of breath or difficulty breathing.  Dizziness and fainting.  You get a rash or develop hives.  You have a decrease in urine output.  Your urine turns a dark color or changes to pink, red, or brown. Any of the following symptoms occur over the next 10 days:  You have a temperature by mouth above 102 F (38.9 C), not controlled by medicine.  Shortness of breath.  Weakness after normal activity.  The white part of the eye turns yellow (jaundice).  You have a decrease in the amount of urine or are urinating less often.  Your urine turns a dark color or changes to pink, red, or brown. Document Released: 05/15/2000 Document Revised: 08/10/2011 Document Reviewed: 01/02/2008 [facility] Patient Information 2014 [place], [place].  _______________________________________________________________________

## 2020-03-11 NOTE — Progress Notes (Signed)
Please enter orders for PAT visit 03-15-20

## 2020-03-12 DIAGNOSIS — J3081 Allergic rhinitis due to animal (cat) (dog) hair and dander: Secondary | ICD-10-CM | POA: Diagnosis not present

## 2020-03-12 DIAGNOSIS — J3089 Other allergic rhinitis: Secondary | ICD-10-CM | POA: Diagnosis not present

## 2020-03-12 DIAGNOSIS — J301 Allergic rhinitis due to pollen: Secondary | ICD-10-CM | POA: Diagnosis not present

## 2020-03-15 ENCOUNTER — Encounter (HOSPITAL_COMMUNITY)
Admission: RE | Admit: 2020-03-15 | Discharge: 2020-03-15 | Disposition: A | Discharge status: Home / Self Care | Payer: 59 | Source: Ambulatory Visit | Attending: Obstetrics and Gynecology | Admitting: Obstetrics and Gynecology

## 2020-03-15 ENCOUNTER — Other Ambulatory Visit: Payer: Self-pay

## 2020-03-15 ENCOUNTER — Encounter (HOSPITAL_COMMUNITY): Payer: Self-pay

## 2020-03-15 DIAGNOSIS — Z01812 Encounter for preprocedural laboratory examination: Secondary | ICD-10-CM | POA: Insufficient documentation

## 2020-03-15 LAB — CBC
HCT: 40.5 % (ref 36.0–46.0)
Hemoglobin: 13.6 g/dL (ref 12.0–15.0)
MCH: 28.6 pg (ref 26.0–34.0)
MCHC: 33.6 g/dL (ref 30.0–36.0)
MCV: 85.3 fL (ref 80.0–100.0)
Platelets: 255 10*3/uL (ref 150–400)
RBC: 4.75 MIL/uL (ref 3.87–5.11)
RDW: 13.4 % (ref 11.5–15.5)
WBC: 6.5 10*3/uL (ref 4.0–10.5)
nRBC: 0 % (ref 0.0–0.2)

## 2020-03-15 LAB — BASIC METABOLIC PANEL
Anion gap: 8 (ref 5–15)
BUN: 10 mg/dL (ref 6–20)
CO2: 25 mmol/L (ref 22–32)
Calcium: 9.2 mg/dL (ref 8.9–10.3)
Chloride: 103 mmol/L (ref 98–111)
Creatinine, Ser: 0.86 mg/dL (ref 0.44–1.00)
GFR, Estimated: >60 mL/min (ref >60)
Glucose, Bld: 93 mg/dL (ref 70–99)
Potassium: 3.9 mmol/L (ref 3.5–5.1)
Sodium: 136 mmol/L (ref 135–145)

## 2020-03-15 NOTE — Progress Notes (Signed)
COVID Vaccine Completed: x2 Date COVID Vaccine completed:  07-28-19 & 08-18-19, 03-15-20 COVID vaccine manufacturer: [place]    PCP - Chana Bode, PA-C  Cardiologist - no  Chest x-ray -  EKG -  Stress Test -  ECHO -  Cardiac Cath -  Pacemaker/ICD device last checked:  Sleep Study -  CPAP -   Fasting Blood Sugar -  Checks Blood Sugar _____ times a day  Blood Thinner Instructions: Aspirin Instructions: Last Dose:  Anesthesia review: pt. Takes Naltrexone per Konrad Felix  PA  Recommendation is to hold 3-5 days prior to surgery if any issues regarding holding medication contact prescriber for recommendations.  Patient denies shortness of breath, fever, cough and chest pain at PAT appointment  none   Patient verbalized understanding of instructions that were given to them at the PAT appointment. Patient was also instructed that they will need to review over the PAT instructions again at home before surgery.

## 2020-03-16 ENCOUNTER — Other Ambulatory Visit (HOSPITAL_COMMUNITY)
Admission: RE | Admit: 2020-03-16 | Discharge: 2020-03-16 | Disposition: A | Discharge status: Home / Self Care | Payer: 59 | Source: Ambulatory Visit | Attending: Obstetrics and Gynecology | Admitting: Obstetrics and Gynecology

## 2020-03-16 DIAGNOSIS — Z01812 Encounter for preprocedural laboratory examination: Secondary | ICD-10-CM | POA: Diagnosis not present

## 2020-03-16 DIAGNOSIS — Z20822 Contact with and (suspected) exposure to covid-19: Secondary | ICD-10-CM | POA: Diagnosis not present

## 2020-03-17 LAB — SARS CORONAVIRUS 2 (TAT 6-24 HRS): SARS Coronavirus 2: NEGATIVE

## 2020-03-18 ENCOUNTER — Other Ambulatory Visit: Payer: Self-pay | Admitting: Medical

## 2020-03-19 MED FILL — SERTRALINE HCL 100 MG TABS: 100 | 90 days supply | Qty: 90 | Fill #0

## 2020-03-19 NOTE — H&P (Signed)
Michelle Griffin is an 49 y.o. female G2P2 presenting for scheduled LAVH/BSO for adenocarcinoma in situ of cervix.   She had a recent conization with no invasive disease noted, but AIS did extend to the lateral margin.  The deep margin was clear. She is s/p endometrial ablation in 2017 and has no menses.  Husband has had a vasectomy.      Pertinent Gynecological History:  OB History: NSVD x2    Menstrual History:  No LMP recorded. Patient has had an ablation.    Past Medical History:  Diagnosis Date  . Adenocarcinoma in situ of cervix   . Allergy    sees [name]  . Anemia    hx/o iron deficiency due to heavy periods  . Asthma    exercise induced  . Bursitis   . Cancer ([place])    cervical  . COVID-19 05/26/2019   fatigue, h/a, loss of taste and smell, fatige x 1 month, loss of taste x 5 months all symptoms rsolved  . DDD (degenerative disc disease), lumbar   . Fatty liver   . GERD (gastroesophageal reflux disease)    [name]  . H/O mammogram   . Hiatal hernia    small per EGD, [name]  . Hyperlipidemia 2012   was on pravastatin prior  . Migraine    prior eval with neurologist, Dr. Melton Alar  . Overactive bladder   . PMDD (premenstrual dysphoric disorder)   . Pyelonephritis    age 41yo  . Uterine infection 2015  . Vitamin D deficiency   . Wears glasses     Past Surgical History:  Procedure Laterality Date  . CERVICAL CONIZATION W/BX N/A 03/05/2020   Procedure: CONIZATION CERVIX WITH BIOPSY;  Surgeon: Paula Compton, MD;  Location: [facility];  Service: Gynecology;  Laterality: N/A;  . CHOLECYSTECTOMY  2011   [facility]  . COLONOSCOPY  2014   repeat 5 years; [name], baseline  . ESOPHAGOGASTRODUODENOSCOPY  2014   [name]  . FOOT SURGERY Left 2011   morton's neuroma  . LASIK  yrs ago   [name]  . LUMBAR DISC SURGERY  07/2013, [facility]   due to herniated disc, [name]  . [place]  2015  . WISDOM TOOTH  EXTRACTION  age 12    Family History  Problem Relation Age of Onset  . Lupus Mother   . Hypertension Mother   . Hyperlipidemia Mother   . Migraines Mother   . Kidney disease Mother   . Hypertension Father   . Diabetes Father   . Hyperlipidemia Father   . Depression Father   . Anxiety disorder Father   . Dementia Father   . Obesity Sister   . Hypertension Sister   . Diabetes Sister   . Heart disease Paternal Uncle        CABG  . Hyperlipidemia Paternal Uncle   . Cancer Paternal Uncle        multiple with colon cancer  . Cancer Cousin        breast  . Cancer Cousin        ovarian  . Ovarian cysts Daughter   . Stroke Neg Hx     Social History:  reports that she has never smoked. She has never used smokeless tobacco. She reports current alcohol use of about 3.0 standard drinks of alcohol per week. She reports that she does not use drugs.  Allergies:  Allergies  Allergen Reactions  .  Sulfa Antibiotics Hives  . Percocet [Oxycodone-Acetaminophen] Itching    Oxycodone " makes me climb the wall, itching"    No medications prior to admission.    Review of Systems  Constitutional: Negative for fever.  Gastrointestinal: Negative for abdominal pain.  Genitourinary: Negative for menstrual problem.    There were no vitals taken for this visit. Physical Exam HENT:     Nose: Nose normal.  Cardiovascular:     Rate and Rhythm: Normal rate and regular rhythm.  Pulmonary:     Effort: Pulmonary effort is normal.  Abdominal:     Palpations: Abdomen is soft.  Genitourinary:    General: Normal vulva.     Vagina: Normal.     Cervix: Erythema (healing conization bed) present.     Uterus: Normal.      Adnexa: Right adnexa normal.  Neurological:     Mental Status: She is alert.     No results found for this or any previous visit (from the past 24 hour(s)).  No results found.  Assessment/Plan: The patient was counseled on hysterectomy in detail and desires to proceed  for definitive treatment of her cervical adenocarcinoma in situ.  After careful consideration she would like her ovaries removed as well.  She understands this will render her surgically menopausal and she will experience an abrupt onset of menopausal symptoms.   We discussed the risks and benefits of LAVH/BSO including bleeding, infection and possible damage to bowel, bladder or ureters. We discussed in the event of a complication she would potentially need a larger incision and a longer hospital stay that may delay her recovery.   She desires to proceed.  We will do a hysteroscopy at the end of the procedure to evaluate the ureters.  Logan Bores 03/19/2020, 9:18 PM

## 2020-03-19 NOTE — Anesthesia Preprocedure Evaluation (Addendum)
Anesthesia Evaluation  Patient identified by MRN, date of birth, ID band Patient awake    Reviewed: Allergy & Precautions, NPO status , Patient's Chart, lab work & pertinent test results  Airway Mallampati: II  TM Distance: >3 FB Neck ROM: Full    Dental no notable dental hx. (+) Dental Advisory Given, Teeth Intact   Pulmonary asthma (mild) ,  H/o COVID 05/2019   Pulmonary exam normal breath sounds clear to auscultation       Cardiovascular Exercise Tolerance: Good negative cardio ROS Normal cardiovascular exam Rhythm:Regular Rate:Normal     Neuro/Psych  Headaches, PSYCHIATRIC DISORDERS Depression    GI/Hepatic Neg liver ROS, hiatal hernia, GERD  ,  Endo/Other  BMI 34  Renal/GU negative Renal ROS  negative genitourinary   Musculoskeletal  (+) Arthritis ,   Abdominal   Peds negative pediatric ROS (+)  Hematology  (+) anemia ,   Anesthesia Other Findings   Reproductive/Obstetrics                            Anesthesia Physical  Anesthesia Plan  ASA: II  Anesthesia Plan: General   Post-op Pain Management:    Induction:   PONV Risk Score and Plan: 4 or greater and Treatment may vary due to age or medical condition, Midazolam, Ondansetron, Dexamethasone, Propofol infusion and Scopolamine patch - Pre-op  Airway Management Planned: Oral ETT  Additional Equipment: None  Intra-op Plan:   Post-operative Plan: Extubation in OR  Informed Consent: I have reviewed the patients History and Physical, chart, labs and discussed the procedure including the risks, benefits and alternatives for the proposed anesthesia with the patient or authorized representative who has indicated his/her understanding and acceptance.     Dental advisory given  Plan Discussed with: CRNA  Anesthesia Plan Comments:        Anesthesia Quick Evaluation

## 2020-03-20 ENCOUNTER — Other Ambulatory Visit: Payer: Self-pay

## 2020-03-20 ENCOUNTER — Ambulatory Visit (HOSPITAL_BASED_OUTPATIENT_CLINIC_OR_DEPARTMENT_OTHER): Payer: 59 | Admitting: Physician Assistant

## 2020-03-20 ENCOUNTER — Encounter (HOSPITAL_BASED_OUTPATIENT_CLINIC_OR_DEPARTMENT_OTHER)
Admission: RE | Disposition: A | Discharge status: Home / Self Care | Payer: Self-pay | Source: Home / Self Care | Attending: Obstetrics and Gynecology

## 2020-03-20 ENCOUNTER — Ambulatory Visit (HOSPITAL_BASED_OUTPATIENT_CLINIC_OR_DEPARTMENT_OTHER): Payer: 59 | Admitting: Anesthesiology

## 2020-03-20 ENCOUNTER — Ambulatory Visit (HOSPITAL_BASED_OUTPATIENT_CLINIC_OR_DEPARTMENT_OTHER)
Admission: RE | Admit: 2020-03-20 | Discharge: 2020-03-21 | Disposition: A | Discharge status: Home / Self Care | Payer: 59 | Attending: Obstetrics and Gynecology | Admitting: Obstetrics and Gynecology

## 2020-03-20 ENCOUNTER — Encounter (HOSPITAL_BASED_OUTPATIENT_CLINIC_OR_DEPARTMENT_OTHER): Payer: Self-pay | Admitting: Obstetrics and Gynecology

## 2020-03-20 DIAGNOSIS — Z885 Allergy status to narcotic agent status: Secondary | ICD-10-CM | POA: Insufficient documentation

## 2020-03-20 DIAGNOSIS — E785 Hyperlipidemia, unspecified: Secondary | ICD-10-CM | POA: Insufficient documentation

## 2020-03-20 DIAGNOSIS — Z8249 Family history of ischemic heart disease and other diseases of the circulatory system: Secondary | ICD-10-CM | POA: Diagnosis not present

## 2020-03-20 DIAGNOSIS — G43909 Migraine, unspecified, not intractable, without status migrainosus: Secondary | ICD-10-CM | POA: Insufficient documentation

## 2020-03-20 DIAGNOSIS — Z833 Family history of diabetes mellitus: Secondary | ICD-10-CM | POA: Insufficient documentation

## 2020-03-20 DIAGNOSIS — Z800 Family history of malignant neoplasm of digestive organs: Secondary | ICD-10-CM | POA: Diagnosis not present

## 2020-03-20 DIAGNOSIS — K449 Diaphragmatic hernia without obstruction or gangrene: Secondary | ICD-10-CM | POA: Diagnosis not present

## 2020-03-20 DIAGNOSIS — Z9049 Acquired absence of other specified parts of digestive tract: Secondary | ICD-10-CM | POA: Diagnosis not present

## 2020-03-20 DIAGNOSIS — E559 Vitamin D deficiency, unspecified: Secondary | ICD-10-CM | POA: Insufficient documentation

## 2020-03-20 DIAGNOSIS — Z8616 Personal history of COVID-19: Secondary | ICD-10-CM | POA: Diagnosis not present

## 2020-03-20 DIAGNOSIS — N858 Other specified noninflammatory disorders of uterus: Secondary | ICD-10-CM | POA: Diagnosis not present

## 2020-03-20 DIAGNOSIS — Z882 Allergy status to sulfonamides status: Secondary | ICD-10-CM | POA: Diagnosis not present

## 2020-03-20 DIAGNOSIS — D251 Intramural leiomyoma of uterus: Secondary | ICD-10-CM | POA: Diagnosis not present

## 2020-03-20 DIAGNOSIS — J45990 Exercise induced bronchospasm: Secondary | ICD-10-CM | POA: Diagnosis not present

## 2020-03-20 DIAGNOSIS — K760 Fatty (change of) liver, not elsewhere classified: Secondary | ICD-10-CM | POA: Diagnosis not present

## 2020-03-20 DIAGNOSIS — Z810 Family history of intellectual disabilities: Secondary | ICD-10-CM | POA: Diagnosis not present

## 2020-03-20 DIAGNOSIS — Z8041 Family history of malignant neoplasm of ovary: Secondary | ICD-10-CM | POA: Insufficient documentation

## 2020-03-20 DIAGNOSIS — D259 Leiomyoma of uterus, unspecified: Secondary | ICD-10-CM | POA: Diagnosis not present

## 2020-03-20 DIAGNOSIS — Z90710 Acquired absence of both cervix and uterus: Secondary | ICD-10-CM | POA: Diagnosis present

## 2020-03-20 DIAGNOSIS — Z820 Family history of epilepsy and other diseases of the nervous system: Secondary | ICD-10-CM | POA: Insufficient documentation

## 2020-03-20 DIAGNOSIS — D069 Carcinoma in situ of cervix, unspecified: Secondary | ICD-10-CM | POA: Diagnosis not present

## 2020-03-20 DIAGNOSIS — Z79899 Other long term (current) drug therapy: Secondary | ICD-10-CM | POA: Diagnosis not present

## 2020-03-20 DIAGNOSIS — Z803 Family history of malignant neoplasm of breast: Secondary | ICD-10-CM | POA: Diagnosis not present

## 2020-03-20 DIAGNOSIS — J45909 Unspecified asthma, uncomplicated: Secondary | ICD-10-CM | POA: Diagnosis not present

## 2020-03-20 DIAGNOSIS — N838 Other noninflammatory disorders of ovary, fallopian tube and broad ligament: Secondary | ICD-10-CM | POA: Diagnosis not present

## 2020-03-20 DIAGNOSIS — Z791 Long term (current) use of non-steroidal anti-inflammatories (NSAID): Secondary | ICD-10-CM | POA: Insufficient documentation

## 2020-03-20 DIAGNOSIS — N8302 Follicular cyst of left ovary: Secondary | ICD-10-CM | POA: Diagnosis not present

## 2020-03-20 DIAGNOSIS — Z8349 Family history of other endocrine, nutritional and metabolic diseases: Secondary | ICD-10-CM | POA: Insufficient documentation

## 2020-03-20 HISTORY — PX: LAPAROSCOPIC VAGINAL HYSTERECTOMY WITH SALPINGECTOMY: SHX6680

## 2020-03-20 HISTORY — PX: CYSTOSCOPY: SHX5120

## 2020-03-20 LAB — TYPE AND SCREEN
ABO/RH(D): O NEG
Antibody Screen: NEGATIVE

## 2020-03-20 LAB — POCT PREGNANCY, URINE: Preg Test, Ur: NEGATIVE

## 2020-03-20 SURGERY — HYSTERECTOMY, VAGINAL, LAPAROSCOPY-ASSISTED, WITH SALPINGECTOMY
Anesthesia: General

## 2020-03-20 MED ORDER — SODIUM CHLORIDE 0.9 % IR SOLN
Status: DC | PRN
  Administered 2020-03-20: 3000 mL

## 2020-03-20 MED ORDER — FLUORESCEIN SODIUM 10 % IV SOLN
INTRAVENOUS | Status: AC
  Filled 2020-03-20: qty 5

## 2020-03-20 MED ORDER — MIDAZOLAM HCL 5 MG/5ML IJ SOLN
INTRAMUSCULAR | Status: DC | PRN
  Administered 2020-03-20: 1 mg via INTRAVENOUS

## 2020-03-20 MED ORDER — CHLORHEXIDINE GLUCONATE 0.12 % MT SOLN: 15.0000 mL | Freq: Once | OROMUCOSAL | Status: DC

## 2020-03-20 MED ORDER — LACTATED RINGERS IV SOLN: INTRAVENOUS | Status: DC

## 2020-03-20 MED ORDER — MAGNESIUM HYDROXIDE 400 MG/5ML PO SUSP
30.0000 mL | Freq: Every day | ORAL | Status: DC | PRN
  Filled 2020-03-20: qty 30

## 2020-03-20 MED ORDER — IBUPROFEN 800 MG PO TABS
ORAL_TABLET | ORAL | Status: AC
  Filled 2020-03-20: qty 1

## 2020-03-20 MED ORDER — PHENYLEPHRINE 40 MCG/ML (10ML) SYRINGE FOR IV PUSH (FOR BLOOD PRESSURE SUPPORT)
PREFILLED_SYRINGE | INTRAVENOUS | Status: AC
  Filled 2020-03-20: qty 10

## 2020-03-20 MED ORDER — HYDROCODONE-ACETAMINOPHEN 5-325 MG PO TABS
ORAL_TABLET | ORAL | Status: AC
  Filled 2020-03-20: qty 2

## 2020-03-20 MED ORDER — DEXAMETHASONE SODIUM PHOSPHATE 10 MG/ML IJ SOLN
INTRAMUSCULAR | Status: DC | PRN
  Administered 2020-03-20: 10 mg via INTRAVENOUS

## 2020-03-20 MED ORDER — PANTOPRAZOLE SODIUM 40 MG PO TBEC
DELAYED_RELEASE_TABLET | ORAL | Status: AC
  Filled 2020-03-20: qty 1

## 2020-03-20 MED ORDER — POVIDONE-IODINE 10 % EX SWAB
2.0000 "application " | Freq: Once | CUTANEOUS | Status: AC
  Administered 2020-03-20: 2 via TOPICAL

## 2020-03-20 MED ORDER — LIDOCAINE 2% (20 MG/ML) 5 ML SYRINGE
INTRAMUSCULAR | Status: DC | PRN
  Administered 2020-03-20: 80 mg via INTRAVENOUS

## 2020-03-20 MED ORDER — HYDROMORPHONE HCL 1 MG/ML IJ SOLN: 0.2000 mg | INTRAMUSCULAR | Status: DC | PRN

## 2020-03-20 MED ORDER — FLUORESCEIN SODIUM 10 % IV SOLN
INTRAVENOUS | Status: DC | PRN
  Administered 2020-03-20: 100 mg via INTRAVENOUS

## 2020-03-20 MED ORDER — VASOPRESSIN 20 UNIT/ML IV SOLN: INTRAVENOUS | Status: DC | PRN

## 2020-03-20 MED ORDER — ONDANSETRON HCL 4 MG/2ML IJ SOLN
INTRAMUSCULAR | Status: AC
  Filled 2020-03-20: qty 2

## 2020-03-20 MED ORDER — CEFAZOLIN SODIUM-DEXTROSE 2-4 GM/100ML-% IV SOLN
INTRAVENOUS | Status: AC
  Filled 2020-03-20: qty 100

## 2020-03-20 MED ORDER — ORAL CARE MOUTH RINSE: 15.0000 mL | Freq: Once | OROMUCOSAL | Status: DC

## 2020-03-20 MED ORDER — MENTHOL 3 MG MT LOZG: 1.0000 | LOZENGE | OROMUCOSAL | Status: DC | PRN

## 2020-03-20 MED ORDER — SIMETHICONE 80 MG PO CHEW
CHEWABLE_TABLET | ORAL | Status: AC
  Filled 2020-03-20: qty 1

## 2020-03-20 MED ORDER — CEFAZOLIN SODIUM-DEXTROSE 2-4 GM/100ML-% IV SOLN
2.0000 g | INTRAVENOUS | Status: AC
  Administered 2020-03-20: 2 g via INTRAVENOUS

## 2020-03-20 MED ORDER — IBUPROFEN 800 MG PO TABS
800.0000 mg | ORAL_TABLET | Freq: Three times a day (TID) | ORAL | Status: DC
  Administered 2020-03-20 – 2020-03-21 (×3): 800 mg via ORAL

## 2020-03-20 MED ORDER — EPHEDRINE SULFATE 50 MG/ML IJ SOLN
INTRAMUSCULAR | Status: DC | PRN
  Administered 2020-03-20: 10 mg via INTRAVENOUS

## 2020-03-20 MED ORDER — MIDAZOLAM HCL 2 MG/2ML IJ SOLN
INTRAMUSCULAR | Status: AC
  Filled 2020-03-20: qty 2

## 2020-03-20 MED ORDER — PROPOFOL 10 MG/ML IV BOLUS
INTRAVENOUS | Status: DC | PRN
  Administered 2020-03-20: 150 mg via INTRAVENOUS

## 2020-03-20 MED ORDER — BUPIVACAINE HCL (PF) 0.25 % IJ SOLN
INTRAMUSCULAR | Status: DC | PRN
  Administered 2020-03-20: 10 mL

## 2020-03-20 MED ORDER — PANTOPRAZOLE SODIUM 40 MG PO TBEC
40.0000 mg | DELAYED_RELEASE_TABLET | Freq: Every day | ORAL | Status: DC
  Administered 2020-03-20: 40 mg via ORAL

## 2020-03-20 MED ORDER — PROMETHAZINE HCL 25 MG/ML IJ SOLN: 6.2500 mg | INTRAMUSCULAR | Status: DC | PRN

## 2020-03-20 MED ORDER — SERTRALINE HCL 100 MG PO TABS
100.0000 mg | ORAL_TABLET | Freq: Every day | ORAL | Status: DC
  Filled 2020-03-20: qty 1

## 2020-03-20 MED ORDER — FENTANYL CITRATE (PF) 100 MCG/2ML IJ SOLN
INTRAMUSCULAR | Status: AC
  Filled 2020-03-20: qty 2

## 2020-03-20 MED ORDER — ONDANSETRON HCL 4 MG/2ML IJ SOLN
INTRAMUSCULAR | Status: DC | PRN
  Administered 2020-03-20: 4 mg via INTRAVENOUS

## 2020-03-20 MED ORDER — FENTANYL CITRATE (PF) 250 MCG/5ML IJ SOLN
INTRAMUSCULAR | Status: AC
  Filled 2020-03-20: qty 5

## 2020-03-20 MED ORDER — ROCURONIUM BROMIDE 10 MG/ML (PF) SYRINGE
PREFILLED_SYRINGE | INTRAVENOUS | Status: AC
  Filled 2020-03-20: qty 10

## 2020-03-20 MED ORDER — ONDANSETRON HCL 4 MG/2ML IJ SOLN: 4.0000 mg | Freq: Four times a day (QID) | INTRAMUSCULAR | Status: DC | PRN

## 2020-03-20 MED ORDER — FAMOTIDINE 20 MG PO TABS: 20.0000 mg | ORAL_TABLET | Freq: Every day | ORAL | Status: DC

## 2020-03-20 MED ORDER — DEXAMETHASONE SODIUM PHOSPHATE 10 MG/ML IJ SOLN
INTRAMUSCULAR | Status: AC
  Filled 2020-03-20: qty 1

## 2020-03-20 MED ORDER — MEPERIDINE HCL 25 MG/ML IJ SOLN: 6.2500 mg | INTRAMUSCULAR | Status: DC | PRN

## 2020-03-20 MED ORDER — SUGAMMADEX SODIUM 200 MG/2ML IV SOLN
INTRAVENOUS | Status: DC | PRN
  Administered 2020-03-20: 200 mg via INTRAVENOUS

## 2020-03-20 MED ORDER — ONDANSETRON HCL 4 MG PO TABS: 4.0000 mg | ORAL_TABLET | Freq: Four times a day (QID) | ORAL | Status: DC | PRN

## 2020-03-20 MED ORDER — PROPOFOL 10 MG/ML IV BOLUS
INTRAVENOUS | Status: AC
  Filled 2020-03-20: qty 20

## 2020-03-20 MED ORDER — FENTANYL CITRATE (PF) 100 MCG/2ML IJ SOLN
INTRAMUSCULAR | Status: DC | PRN
  Administered 2020-03-20 (×3): 50 ug via INTRAVENOUS
  Administered 2020-03-20: 150 ug via INTRAVENOUS
  Administered 2020-03-20: 50 ug via INTRAVENOUS

## 2020-03-20 MED ORDER — ROCURONIUM BROMIDE 10 MG/ML (PF) SYRINGE
PREFILLED_SYRINGE | INTRAVENOUS | Status: DC | PRN
  Administered 2020-03-20 (×3): 10 mg via INTRAVENOUS
  Administered 2020-03-20: 60 mg via INTRAVENOUS

## 2020-03-20 MED ORDER — LIDOCAINE 2% (20 MG/ML) 5 ML SYRINGE
INTRAMUSCULAR | Status: AC
  Filled 2020-03-20: qty 5

## 2020-03-20 MED ORDER — HYDROCODONE-ACETAMINOPHEN 5-325 MG PO TABS
1.0000 | ORAL_TABLET | ORAL | Status: DC | PRN
  Administered 2020-03-20: 1 via ORAL
  Administered 2020-03-20: 2 via ORAL
  Administered 2020-03-20: 1 via ORAL
  Administered 2020-03-20 – 2020-03-21 (×3): 2 via ORAL

## 2020-03-20 MED ORDER — SIMETHICONE 80 MG PO CHEW
80.0000 mg | CHEWABLE_TABLET | Freq: Four times a day (QID) | ORAL | Status: DC | PRN
  Administered 2020-03-20 (×3): 80 mg via ORAL

## 2020-03-20 MED ORDER — HYDROMORPHONE HCL 1 MG/ML IJ SOLN
INTRAMUSCULAR | Status: AC
  Filled 2020-03-20: qty 1

## 2020-03-20 MED ORDER — EPHEDRINE 5 MG/ML INJ
INTRAVENOUS | Status: AC
  Filled 2020-03-20: qty 10

## 2020-03-20 MED ORDER — CETIRIZINE HCL 10 MG PO TABS
10.0000 mg | ORAL_TABLET | Freq: Every evening | ORAL | Status: DC
  Administered 2020-03-20: 10 mg via ORAL
  Filled 2020-03-20: qty 1

## 2020-03-20 MED ORDER — HYDROMORPHONE HCL 1 MG/ML IJ SOLN
0.2500 mg | INTRAMUSCULAR | Status: DC | PRN
  Administered 2020-03-20: 0.5 mg via INTRAVENOUS
  Administered 2020-03-20 (×2): 0.25 mg via INTRAVENOUS
  Administered 2020-03-20 (×2): 0.5 mg via INTRAVENOUS

## 2020-03-20 MED ORDER — MONTELUKAST SODIUM 10 MG PO TABS
10.0000 mg | ORAL_TABLET | Freq: Every day | ORAL | Status: DC
  Administered 2020-03-20: 10 mg via ORAL
  Filled 2020-03-20: qty 1

## 2020-03-20 MED ORDER — PHENYLEPHRINE 40 MCG/ML (10ML) SYRINGE FOR IV PUSH (FOR BLOOD PRESSURE SUPPORT)
PREFILLED_SYRINGE | INTRAVENOUS | Status: DC | PRN
  Administered 2020-03-20: 80 ug via INTRAVENOUS
  Administered 2020-03-20: 40 ug via INTRAVENOUS

## 2020-03-20 SURGICAL SUPPLY — 40 items
ADH SKN CLS APL DERMABOND .7 (GAUZE/BANDAGES/DRESSINGS) ×2
APL SRG 38 LTWT LNG FL B (MISCELLANEOUS)
APPLICATOR ARISTA FLEXITIP XL (MISCELLANEOUS) IMPLANT
CABLE HIGH FREQUENCY MONO STRZ (ELECTRODE) IMPLANT
COVER BACK TABLE 60X90IN (DRAPES) ×3 IMPLANT
COVER MAYO STAND STRL (DRAPES) ×3 IMPLANT
COVER WAND RF STERILE (DRAPES) ×3 IMPLANT
DERMABOND ADVANCED (GAUZE/BANDAGES/DRESSINGS) ×1
DERMABOND ADVANCED .7 DNX12 (GAUZE/BANDAGES/DRESSINGS) ×2 IMPLANT
DRSG COVADERM PLUS 2X2 (GAUZE/BANDAGES/DRESSINGS) IMPLANT
DRSG OPSITE POSTOP 3X4 (GAUZE/BANDAGES/DRESSINGS) ×3 IMPLANT
DURAPREP 26ML APPLICATOR (WOUND CARE) ×3 IMPLANT
ELECT REM PT RETURN 9FT ADLT (ELECTROSURGICAL) ×3
ELECTRODE REM PT RTRN 9FT ADLT (ELECTROSURGICAL) IMPLANT
GAUZE 4X4 16PLY RFD (DISPOSABLE) ×3 IMPLANT
GLOVE BIO SURGEON STRL SZ7 (GLOVE) ×6 IMPLANT
GLOVE BIOGEL PI IND STRL 7.0 (GLOVE) ×2 IMPLANT
GLOVE BIOGEL PI INDICATOR 7.0 (GLOVE) ×1
GLOVE ECLIPSE 6.5 STRL STRAW (GLOVE) ×3 IMPLANT
HEMOSTAT ARISTA ABSORB 3G PWDR (HEMOSTASIS) IMPLANT
NEEDLE INSUFFLATION 120MM (ENDOMECHANICALS) ×3 IMPLANT
NS IRRIG 500ML POUR BTL (IV SOLUTION) ×3 IMPLANT
PACK LAVH (CUSTOM PROCEDURE TRAY) ×3 IMPLANT
PACK ROBOTIC GOWN (GOWN DISPOSABLE) ×3 IMPLANT
PACK TRENDGUARD 450 HYBRID PRO (MISCELLANEOUS) ×1 IMPLANT
PROTECTOR NERVE ULNAR (MISCELLANEOUS) ×6 IMPLANT
SET IRRIG Y TYPE TUR BLADDER L (SET/KITS/TRAYS/PACK) ×3 IMPLANT
SET SUCTION IRRIG HYDROSURG (IRRIGATION / IRRIGATOR) ×1 IMPLANT
SET TUBE SMOKE EVAC HIGH FLOW (TUBING) ×3 IMPLANT
SHEARS HARMONIC ACE PLUS 36CM (ENDOMECHANICALS) ×3 IMPLANT
SUT VIC AB 0 CT1 18XCR BRD8 (SUTURE) ×4 IMPLANT
SUT VIC AB 0 CT1 8-18 (SUTURE) ×6
SUT VIC AB 2-0 CT1 (SUTURE) ×5 IMPLANT
SUT VIC AB 4-0 PS2 18 (SUTURE) ×5 IMPLANT
SUT VICRYL 0 TIES 12 18 (SUTURE) IMPLANT
TOWEL OR 17X26 10 PK STRL BLUE (TOWEL DISPOSABLE) ×6 IMPLANT
TRAY FOLEY W/BAG SLVR 14FR LF (SET/KITS/TRAYS/PACK) ×3 IMPLANT
TRENDGUARD 450 HYBRID PRO PACK (MISCELLANEOUS) ×3
TROCAR BLADELESS OPT 5 100 (ENDOMECHANICALS) ×9 IMPLANT
WARMER LAPAROSCOPE (MISCELLANEOUS) ×3 IMPLANT

## 2020-03-20 NOTE — Discharge Summary (Signed)
Physician Discharge Summary  Patient ID: Michelle Griffin MRN: [id number] DOB/AGE: [birth year] 47 y.o.  Admit date: 03/20/2020 Discharge date: 03/21/2020  Admission Diagnoses: Adenocarcinoma in situ of cervix Fibroids  Discharge Diagnoses:  Active Problems:   S/P laparoscopic assisted vaginal hysterectomy (LAVH)   Discharged Condition: good  Hospital Course: Pt was admitted for post-operative observation s/p LAVH/BSO.  By postoperative day #1 she was ambulating and tolerating a regular diet  Her pain was controlled on oral medications and she was voiding without problem.  She was d/c home   Consults: None  Significant Diagnostic Studies: labs: cbc  Treatments: surgery: LAVH/BSO  Discharge Exam: Blood pressure 118/62, pulse 81, temperature 98.4 F (36.9 C), resp. rate 16, height 5\' 10"  (1.778 m), weight 104.6 kg, SpO2 98 %. General appearance: alert and cooperative GI: soft NT Incision/Wound: clear and well approximated  Disposition:  Discharge disposition: 01-Home or Self Care       Discharge Instructions    Call MD for:  persistant nausea and vomiting   Complete by: As directed    Call MD for:  redness, tenderness, or signs of infection (pain, swelling, redness, odor or green/yellow discharge around incision site)   Complete by: As directed    Call MD for:  severe uncontrolled pain   Complete by: As directed    Call MD for:  temperature >100.4   Complete by: As directed    Diet - low sodium heart healthy   Complete by: As directed    Discharge instructions   Complete by: As directed    Avoid driving for at least 1-2 weeks or until off narcotic pain meds.  No heavy lifting greater than 10 lbs.  Nothing in vagina for 6 weeks.  May remove bandage in 1-2 days.  Shower over incision and pat dry.     Allergies as of 03/21/2020      Reactions   Sulfa Antibiotics Hives   Percocet [oxycodone-acetaminophen] Itching   Oxycodone " makes me climb the wall, itching"       Medication List    STOP taking these medications   acetaminophen 500 MG tablet Commonly known as: TYLENOL   azelastine 0.1 % nasal spray Commonly known as: ASTELIN     TAKE these medications   ALPRAZolam 0.5 MG tablet Commonly known as: XANAX Take 0.5 mg by mouth as needed for anxiety.   azelastine 0.05 % ophthalmic solution Commonly known as: OPTIVAR Place 1 drop into both eyes daily as needed (allergies).   cholecalciferol 25 MCG (1000 UNIT) tablet Commonly known as: VITAMIN D3 Take 1 tablet (1,000 Units total) by mouth daily.   EPINEPHrine 0.3 mg/0.3 mL Soaj injection Commonly known as: EPI-PEN Inject 0.3 mg into the muscle as needed for anaphylaxis.   famotidine 20 MG tablet Commonly known as: PEPCID Take 20 mg by mouth daily.   Flonase 50 MCG/ACT nasal spray Generic drug: fluticasone Place 1 spray into both nostrils daily.   HYDROcodone-acetaminophen 5-325 MG tablet Commonly known as: NORCO/VICODIN Take 1-2 tablets by mouth every 4 (four) hours as needed for moderate pain. What changed:   how much to take  when to take this   ibuprofen 800 MG tablet Commonly known as: ADVIL Take 1 tablet (800 mg total) by mouth every 8 (eight) hours. What changed:   medication strength  how much to take  when to take this  reasons to take this   levocetirizine 5 MG tablet Commonly known as: XYZAL Take 5 mg by  mouth every evening. What changed: Another medication with the same name was removed. Continue taking this medication, and follow the directions you see here.   montelukast 10 MG tablet Commonly known as: SINGULAIR Take 10 mg by mouth at bedtime.   naltrexone 50 MG tablet Commonly known as: DEPADE Take 1 tablet (50 mg total) by mouth at bedtime.   Restora Caps Take 1 capsule by mouth daily.   sertraline 100 MG tablet Commonly known as: ZOLOFT Take 1 tablet (100 mg total) by mouth daily.       Follow-up Information    Paula Compton, MD.  Schedule an appointment as soon as possible for a visit in 2 week(s).   Specialty: Obstetrics and Gynecology Why: Incision check Contact information: [address] [place] [address] [phone]               Signed: Logan Bores 03/21/2020, 8:30 AM

## 2020-03-20 NOTE — Anesthesia Procedure Notes (Signed)
Procedure Name: Intubation Date/Time: 03/20/2020 8:41 AM Performed by: Rogers Blocker, CRNA Pre-anesthesia Checklist: Patient identified, Emergency Drugs available, Suction available and Patient being monitored Patient Re-evaluated:Patient Re-evaluated prior to induction Oxygen Delivery Method: Circle System Utilized Preoxygenation: Pre-oxygenation with 100% oxygen Induction Type: IV induction Ventilation: Mask ventilation without difficulty Laryngoscope Size: Mac and 3 Grade View: Grade I Tube type: Oral Number of attempts: 1 Airway Equipment and Method: Stylet Placement Confirmation: ETT inserted through vocal cords under direct vision,  positive ETCO2 and breath sounds checked- equal and bilateral Secured at: 21 cm Tube secured with: Tape Dental Injury: Teeth and Oropharynx as per pre-operative assessment

## 2020-03-20 NOTE — Op Note (Signed)
Operative Note  Dr. Ivor Costa assistance was needed for retraction and visibility to complete the surgery  Preoperative Diagnosis Adenocarcinoma in situ of the cervix Fibroid uterus  Postoperative Diagnosis same  Procedure Laparoscopic assisted vaginal hysterectomy/Bilateral Salpingoophorectomies Cystoscopy  Surgeon Paula Compton, MD Carlynn Purl, DO  Anesthesia GETA  Fluids: EBL 548mL UOP 122ml clear IVF 1567mL LR  Findings The uterus was enlarged with fibroids, about 10 weeks size.  The cervix had a healing conization bed and was a bit friable.  Ovaries and tubes were WNL.  Specimen Uterus, cervix, ovaries and tubes  Procedure Note Patient was taken to the operating room where general anesthesia was obtained without difficulty. She was then prepped and draped in the normal sterile fashion in the dorsal lithotomy position. An appropriate timeout was performed. A speculum was then placed within the vagina and an acorn tenaculum placed within the cervix which was a bit flush with the vagina from her recent conization to be used for uterine manipulation. A foley catheter was placed in the bladder.  Attention was then turned to the patient's abdomen after draping where the infraumbilical area was injected with approximately 10 cc of quarter percent Marcaine. A 1 cm incision was then made within the umbilicus and the varies needle easily introduced into the peritoneal cavity. Intraperitoneal placement was confirmed by aspiration and injection with normal saline. Gas flow was then applied and a pneumoperitoneum obtained with approximate 3 L of CO2 gas. The varies needle was then removed and a 5 mm optiview trocar was easily introduced into the abdomen under direct visualization. . With patient in Trendelenburg the uterus and tubes and ovaries were inspected with findings as previously stated. Two additional 54mm trocars were placed in the upper lateral quadrants under direct  visualization after injection with quarter percent marcaine.  The Harmonic scalpel was then utilized to dissect the infundibulopelvic ligament and free the ovaries and  fallopian tubes from the side wall.  The broad ligament and round ligaments were then taken down bilaterally with the harmonic scalpel down to the level of the the bladder flap.  The bladder flap was taken down from the lower uterine segment and pushed away to expose the cervix.  An area of bleeding at the right uterine artery was cauterized.  The acorn tenaculum was pushing through the lower uterine segment a bit, so was removed.  Attention was then turned to the vagina while Dr. Terri Piedra watched from above for entry into the anterior and posterior cul-de-sac given the planes were a bit distorted from the conization. The foley catheter was removed.   The cervix was grasped with Yates Decamp tenaculums x 2.  The bovie was then used to make a circumferential incision.  The mayo scissors then further dissected the vaginal mucosa from the underlying cervix and the anterior cul-de-sac was entered with the deaver retractor under direct laparoscopic visualization. The posterior cul de sac was then entered sharply and the banana speculum placed within and confirmed laparoscopically.  With a banana speculum and deaver retractor isolating the uterus from the bladder and rectum.  The uterosacral ligaments and paracervical tissue was taken down sequentially with parametrial clamps and suture ligated with zero vicryl at each step.  When  the was uterus freed on the patient's left, it was then delivered and the remaining tissue on the right clamped and transected completely freeing the uterus and tubes.  It was handed off to pathology.   A small amount of bleeding on the right was controlled  with a figure of eight sutures of zero vicryl and then the pedicle was hemostatic. The uterosacral ligaments were approximated with zero vicryl.  The peritoneum was closed with a  pursestring of 2-0 vicryl.  The short weighted speculum was placed and the cuff run with a running locked 2-0 vicryl for hemostasis. All instruments were then removed from the vagina.  A cystoscopy was performed and the bladder had no trauma noted or sutures.  Both ureteral jets were seen and normal.   Dr. Terri Piedra visualized all pedicles laparoscopically and all were hemostatic as well as the cuff. .  The ureters were visualized and normal in appearance. A four quadrant view of the pelvis and abdomen was performed and found to be normal with no bleeding or injuries noted.  The instruments were removed from the abdomen as well as the 5 mm lateral ports under visualization.  The pneumoperitoneum was reduced through the trocar. The trocar was finally removed and the infraumbilical incision and lateral incisions were closed with a subcuticular stitch of 3-0 Vicryl. Dermabond and a bandage were placed. Patient was then awakened and taken to the recovery room in good condition.

## 2020-03-20 NOTE — Progress Notes (Signed)
Patient ID: Michelle Griffin, female   DOB: 10-05-1970, 49 y.o.   MRN: [id number] Per pt no changes in dictated H&P.  She is ready to proceed.  She confirms she definitely wants removal of ovaries.  This is added to consent form as well as planned cystoscopy which was not on there. We have reviewed surgical menopause in detail.  Brief exam WNL and pt ready to proceed.

## 2020-03-20 NOTE — Transfer of Care (Signed)
Immediate Anesthesia Transfer of Care Note  Patient: Michelle Griffin  Procedure(s) Performed: LAPAROSCOPIC ASSISTED VAGINAL HYSTERECTOMY WITH SALPINGECTOMY (Bilateral ) CYSTOSCOPY (N/A )  Patient Location: PACU  Anesthesia Type:General  Level of Consciousness: awake, alert , oriented and patient cooperative  Airway & Oxygen Therapy: Patient Spontanous Breathing and Patient connected to nasal cannula oxygen  Post-op Assessment: Report given to RN and Post -op Vital signs reviewed and stable  Post vital signs: Reviewed and stable  Last Vitals:  Vitals Value Taken Time  BP 130/50 03/20/20 1115  Temp 36.7 C 03/20/20 1115  Pulse 88 03/20/20 1118  Resp 18 03/20/20 1118  SpO2 99 % 03/20/20 1118  Vitals shown include unvalidated device data.  Last Pain:  Vitals:   03/20/20 0659  TempSrc: Oral         Complications: No complications documented.

## 2020-03-20 NOTE — Progress Notes (Signed)
Day of Surgery Procedure(s) (LRB): LAPAROSCOPIC ASSISTED VAGINAL HYSTERECTOMY WITH SALPINGECTOMY (Bilateral) CYSTOSCOPY (N/A)  Subjective: Patient reports tolerating PO.  Pain controlled, just some gas pain  Objective: I have reviewed patient's vital signs and intake and output.  General: alert and cooperative GI: soft NT and incisions clear Vaginal Bleeding: minimal  Assessment: s/p Procedure(s): LAPAROSCOPIC ASSISTED VAGINAL HYSTERECTOMY WITH SALPINGECTOMY (Bilateral) CYSTOSCOPY (N/A): progressing well  Plan: Discontinue IV fluids d/c foley   plan d/c in AM  LOS: 0 days    Michelle Griffin 03/20/2020, 5:48 PM

## 2020-03-21 ENCOUNTER — Other Ambulatory Visit (HOSPITAL_BASED_OUTPATIENT_CLINIC_OR_DEPARTMENT_OTHER): Payer: Self-pay | Admitting: Obstetrics and Gynecology

## 2020-03-21 ENCOUNTER — Encounter (HOSPITAL_BASED_OUTPATIENT_CLINIC_OR_DEPARTMENT_OTHER): Payer: Self-pay | Admitting: Obstetrics and Gynecology

## 2020-03-21 DIAGNOSIS — G43909 Migraine, unspecified, not intractable, without status migrainosus: Secondary | ICD-10-CM | POA: Diagnosis not present

## 2020-03-21 DIAGNOSIS — K449 Diaphragmatic hernia without obstruction or gangrene: Secondary | ICD-10-CM | POA: Diagnosis not present

## 2020-03-21 DIAGNOSIS — K760 Fatty (change of) liver, not elsewhere classified: Secondary | ICD-10-CM | POA: Diagnosis not present

## 2020-03-21 DIAGNOSIS — J45990 Exercise induced bronchospasm: Secondary | ICD-10-CM | POA: Diagnosis not present

## 2020-03-21 DIAGNOSIS — J3081 Allergic rhinitis due to animal (cat) (dog) hair and dander: Secondary | ICD-10-CM | POA: Diagnosis not present

## 2020-03-21 DIAGNOSIS — E785 Hyperlipidemia, unspecified: Secondary | ICD-10-CM | POA: Diagnosis not present

## 2020-03-21 DIAGNOSIS — Z8616 Personal history of COVID-19: Secondary | ICD-10-CM | POA: Diagnosis not present

## 2020-03-21 DIAGNOSIS — J301 Allergic rhinitis due to pollen: Secondary | ICD-10-CM | POA: Diagnosis not present

## 2020-03-21 DIAGNOSIS — J3089 Other allergic rhinitis: Secondary | ICD-10-CM | POA: Diagnosis not present

## 2020-03-21 DIAGNOSIS — D259 Leiomyoma of uterus, unspecified: Secondary | ICD-10-CM | POA: Diagnosis not present

## 2020-03-21 DIAGNOSIS — E559 Vitamin D deficiency, unspecified: Secondary | ICD-10-CM | POA: Diagnosis not present

## 2020-03-21 DIAGNOSIS — D069 Carcinoma in situ of cervix, unspecified: Secondary | ICD-10-CM | POA: Diagnosis not present

## 2020-03-21 LAB — CBC
HCT: 34 % — ABNORMAL LOW (ref 36.0–46.0)
Hemoglobin: 10.9 g/dL — ABNORMAL LOW (ref 12.0–15.0)
MCH: 27.9 pg (ref 26.0–34.0)
MCHC: 32.1 g/dL (ref 30.0–36.0)
MCV: 87.2 fL (ref 80.0–100.0)
Platelets: 207 10*3/uL (ref 150–400)
RBC: 3.9 MIL/uL (ref 3.87–5.11)
RDW: 13.6 % (ref 11.5–15.5)
WBC: 12.1 10*3/uL — ABNORMAL HIGH (ref 4.0–10.5)
nRBC: 0 % (ref 0.0–0.2)

## 2020-03-21 MED ORDER — HYDROCODONE-ACETAMINOPHEN 5-325 MG PO TABS
1.0000 | ORAL_TABLET | ORAL | 0 refills | Status: DC | PRN
Start: 2020-03-21 — End: 2020-07-03

## 2020-03-21 MED ORDER — IBUPROFEN 800 MG PO TABS
ORAL_TABLET | ORAL | Status: AC
  Filled 2020-03-21: qty 1

## 2020-03-21 MED ORDER — HYDROCODONE-ACETAMINOPHEN 5-325 MG PO TABS
ORAL_TABLET | ORAL | Status: AC
  Filled 2020-03-21: qty 2

## 2020-03-21 MED ORDER — IBUPROFEN 800 MG PO TABS: 800.0000 mg | ORAL_TABLET | Freq: Three times a day (TID) | ORAL | 0 refills | Status: DC

## 2020-03-21 MED FILL — HYDROCODON-APAP 5-325: 5-325 | 2 days supply | Qty: 20 | Fill #0

## 2020-03-21 MED FILL — IBUPROFEN 800 MG TAB: 800 | 10 days supply | Qty: 30 | Fill #0

## 2020-03-21 NOTE — Anesthesia Postprocedure Evaluation (Signed)
Anesthesia Post Note  Patient: Michelle Griffin  Procedure(s) Performed: LAPAROSCOPIC ASSISTED VAGINAL HYSTERECTOMY WITH SALPINGECTOMY (Bilateral ) CYSTOSCOPY (N/A )     Patient location during evaluation: PACU Anesthesia Type: General Level of consciousness: sedated and patient cooperative Pain management: pain level controlled Vital Signs Assessment: post-procedure vital signs reviewed and stable Respiratory status: spontaneous breathing Cardiovascular status: stable Anesthetic complications: no   No complications documented.  Last Vitals:  Vitals:   03/21/20 0628 03/21/20 0851  BP: 118/62 128/80  Pulse: 81 86  Resp: 16 16  Temp: 36.9 C 36.6 C  SpO2: 98% 100%    Last Pain:  Vitals:   03/21/20 0851  TempSrc:   PainSc: [place]

## 2020-03-21 NOTE — Progress Notes (Signed)
1 Day Post-Op Procedure(s) (LRB): LAPAROSCOPIC ASSISTED VAGINAL HYSTERECTOMY WITH SALPINGECTOMY (Bilateral) CYSTOSCOPY (N/A)  Subjective: Patient reports tolerating PO, + flatus and no problems voiding.    Objective: I have reviewed patient's vital signs and intake and output.  General: alert and cooperative GI: Soft NT, incisions well-approximated Vaginal Bleeding: minimal  Assessment: s/p Procedure(s): LAPAROSCOPIC ASSISTED VAGINAL HYSTERECTOMY WITH SALPINGECTOMY (Bilateral) CYSTOSCOPY (N/A): progressing well  Plan: Discharge home  LOS: 0 days    Michelle Griffin 03/21/2020, 8:25 AM

## 2020-03-21 NOTE — Discharge Instructions (Signed)
Laparoscopically Assisted Vaginal Hysterectomy, Care After This sheet gives you information about how to care for yourself after your procedure. Your health care provider may also give you more specific instructions. If you have problems or questions, contact your health care provider. What can I expect after the procedure? After the procedure, it is common to have:  Soreness and numbness in your incision areas.  Abdominal pain. You will be given pain medicine to control it.  Vaginal bleeding and discharge. You will need to use a sanitary napkin after this procedure.  Sore throat from the breathing tube that was inserted during surgery. Follow these instructions at home: Medicines  Take over-the-counter and prescription medicines only as told by your health care provider.  Do not take aspirin or ibuprofen. These medicines can cause bleeding.  Do not drive or use heavy machinery while taking prescription pain medicine.  Do not drive for 24 hours if you were given a medicine to help you relax (sedative) during the procedure. Incision care   Follow instructions from your health care provider about how to take care of your incisions. Make sure you: ? Wash your hands with soap and water before you change your bandage (dressing). If soap and water are not available, use hand sanitizer. ? Change your dressing as told by your health care provider. ? Leave stitches (sutures), skin glue, or adhesive strips in place. These skin closures may need to stay in place for 2 weeks or longer. If adhesive strip edges start to loosen and curl up, you may trim the loose edges. Do not remove adhesive strips completely unless your health care provider tells you to do that.  Check your incision area every day for signs of infection. Check for: ? Redness, swelling, or pain. ? Fluid or blood. ? Warmth. ? Pus or a bad smell. Activity  Get regular exercise as told by your health care provider. You may be  told to take short walks every day and go farther each time.  Return to your normal activities as told by your health care provider. Ask your health care provider what activities are safe for you.  Do not douche, use tampons, or have sexual intercourse for at least 6 weeks, or until your health care provider gives you permission.  Do not lift anything that is heavier than 10 lb (4.5 kg), or the limit that your health care provider tells you, until he or she says that it is safe. General instructions  Do not take baths, swim, or use a hot tub until your health care provider approves. Take showers instead of baths.  Do not drive for 24 hours if you received a sedative.  Do not drive or operate heavy machinery while taking prescription pain medicine.  To prevent or treat constipation while you are taking prescription pain medicine, your health care provider may recommend that you: ? Drink enough fluid to keep your urine clear or pale yellow. ? Take over-the-counter or prescription medicines. ? Eat foods that are high in fiber, such as fresh fruits and vegetables, whole grains, and beans. ? Limit foods that are high in fat and processed sugars, such as fried and sweet foods.  Keep all follow-up visits as told by your health care provider. This is important. Contact a health care provider if:  You have signs of infection, such as: ? Redness, swelling, or pain around your incision sites. ? Fluid or blood coming from an incision. ? An incision that feels warm to the   touch. ? Pus or a bad smell coming from an incision.  Your incision breaks open.  Your pain medicine is not helping.  You feel dizzy or light-headed.  You have pain or bleeding when you urinate.  You have persistent nausea and vomiting.  You have blood, pus, or a bad-smelling discharge from your vagina. Get help right away if:  You have a fever.  You have severe abdominal pain.  You have chest pain.  You have  shortness of breath.  You faint.  You have pain, swelling, or redness in your leg.  You have heavy bleeding from your vagina. Summary  After the procedure, it is common to have abdominal pain and vaginal bleeding.  You should not drive or lift heavy objects until your health care provider says that it is safe.  Contact your health care provider if you have any symptoms of infection, excessive vaginal bleeding, nausea, vomiting, or shortness of breath. This information is not intended to replace advice given to you by your health care provider. Make sure you discuss any questions you have with your health care provider. Document Revised: 04/30/2017 Document Reviewed: 07/14/2016 [employer] Patient Education  2020 [employer]  

## 2020-03-25 LAB — SURGICAL PATHOLOGY

## 2020-03-26 DIAGNOSIS — J3089 Other allergic rhinitis: Secondary | ICD-10-CM | POA: Diagnosis not present

## 2020-03-26 DIAGNOSIS — J3081 Allergic rhinitis due to animal (cat) (dog) hair and dander: Secondary | ICD-10-CM | POA: Diagnosis not present

## 2020-03-26 DIAGNOSIS — J301 Allergic rhinitis due to pollen: Secondary | ICD-10-CM | POA: Diagnosis not present

## 2020-03-26 DIAGNOSIS — J4520 Mild intermittent asthma, uncomplicated: Secondary | ICD-10-CM | POA: Diagnosis not present

## 2020-03-26 DIAGNOSIS — H1045 Other chronic allergic conjunctivitis: Secondary | ICD-10-CM | POA: Diagnosis not present

## 2020-03-28 DIAGNOSIS — L821 Other seborrheic keratosis: Secondary | ICD-10-CM | POA: Diagnosis not present

## 2020-03-28 DIAGNOSIS — L814 Other melanin hyperpigmentation: Secondary | ICD-10-CM | POA: Diagnosis not present

## 2020-03-28 DIAGNOSIS — D2372 Other benign neoplasm of skin of left lower limb, including hip: Secondary | ICD-10-CM | POA: Diagnosis not present

## 2020-04-01 ENCOUNTER — Other Ambulatory Visit: Payer: Self-pay | Admitting: Medical

## 2020-04-01 MED FILL — VITAMIN D3 25 MCG (1000 UT): 25 MCG | 100 days supply | Qty: 100 | Fill #2

## 2020-04-01 MED FILL — LEVOCETIRIZINE 5 MG TABLET: 5 | 90 days supply | Qty: 90 | Fill #0

## 2020-04-01 MED FILL — MONTELUKAST SOD 10 MG TAB: 10 | 90 days supply | Qty: 90 | Fill #1

## 2020-04-01 MED FILL — FAMOTIDINE 20 MG TABLET: 20 | 90 days supply | Qty: 180 | Fill #1

## 2020-04-03 DIAGNOSIS — J301 Allergic rhinitis due to pollen: Secondary | ICD-10-CM | POA: Diagnosis not present

## 2020-04-03 DIAGNOSIS — J3089 Other allergic rhinitis: Secondary | ICD-10-CM | POA: Diagnosis not present

## 2020-04-03 DIAGNOSIS — J3081 Allergic rhinitis due to animal (cat) (dog) hair and dander: Secondary | ICD-10-CM | POA: Diagnosis not present

## 2020-04-04 ENCOUNTER — Other Ambulatory Visit (HOSPITAL_COMMUNITY): Payer: Self-pay | Admitting: Allergy

## 2020-04-04 MED FILL — AZELASTINE HCL 137 MCG/SPRA: 137 | 75 days supply | Qty: 90 | Fill #0

## 2020-04-23 DIAGNOSIS — J3081 Allergic rhinitis due to animal (cat) (dog) hair and dander: Secondary | ICD-10-CM | POA: Diagnosis not present

## 2020-04-23 DIAGNOSIS — J3089 Other allergic rhinitis: Secondary | ICD-10-CM | POA: Diagnosis not present

## 2020-04-23 DIAGNOSIS — J301 Allergic rhinitis due to pollen: Secondary | ICD-10-CM | POA: Diagnosis not present

## 2020-04-30 DIAGNOSIS — J3081 Allergic rhinitis due to animal (cat) (dog) hair and dander: Secondary | ICD-10-CM | POA: Diagnosis not present

## 2020-04-30 DIAGNOSIS — J3089 Other allergic rhinitis: Secondary | ICD-10-CM | POA: Diagnosis not present

## 2020-04-30 DIAGNOSIS — J301 Allergic rhinitis due to pollen: Secondary | ICD-10-CM | POA: Diagnosis not present

## 2020-05-07 DIAGNOSIS — J3089 Other allergic rhinitis: Secondary | ICD-10-CM | POA: Diagnosis not present

## 2020-05-07 DIAGNOSIS — J301 Allergic rhinitis due to pollen: Secondary | ICD-10-CM | POA: Diagnosis not present

## 2020-05-07 DIAGNOSIS — J3081 Allergic rhinitis due to animal (cat) (dog) hair and dander: Secondary | ICD-10-CM | POA: Diagnosis not present

## 2020-05-16 DIAGNOSIS — J3089 Other allergic rhinitis: Secondary | ICD-10-CM | POA: Diagnosis not present

## 2020-05-16 DIAGNOSIS — J3081 Allergic rhinitis due to animal (cat) (dog) hair and dander: Secondary | ICD-10-CM | POA: Diagnosis not present

## 2020-05-16 DIAGNOSIS — J301 Allergic rhinitis due to pollen: Secondary | ICD-10-CM | POA: Diagnosis not present

## 2020-05-19 ENCOUNTER — Other Ambulatory Visit: Payer: Self-pay

## 2020-05-19 ENCOUNTER — Emergency Department (HOSPITAL_BASED_OUTPATIENT_CLINIC_OR_DEPARTMENT_OTHER)
Admission: EM | Admit: 2020-05-19 | Discharge: 2020-05-19 | Disposition: A | Discharge status: Home / Self Care | Payer: 59 | Attending: Emergency Medicine | Admitting: Emergency Medicine

## 2020-05-19 ENCOUNTER — Emergency Department (HOSPITAL_BASED_OUTPATIENT_CLINIC_OR_DEPARTMENT_OTHER): Payer: 59

## 2020-05-19 ENCOUNTER — Encounter (HOSPITAL_BASED_OUTPATIENT_CLINIC_OR_DEPARTMENT_OTHER): Payer: Self-pay | Admitting: Emergency Medicine

## 2020-05-19 DIAGNOSIS — J4530 Mild persistent asthma, uncomplicated: Secondary | ICD-10-CM | POA: Diagnosis not present

## 2020-05-19 DIAGNOSIS — R1013 Epigastric pain: Secondary | ICD-10-CM | POA: Insufficient documentation

## 2020-05-19 DIAGNOSIS — Z7951 Long term (current) use of inhaled steroids: Secondary | ICD-10-CM | POA: Diagnosis not present

## 2020-05-19 DIAGNOSIS — R110 Nausea: Secondary | ICD-10-CM | POA: Diagnosis not present

## 2020-05-19 DIAGNOSIS — Z8616 Personal history of COVID-19: Secondary | ICD-10-CM | POA: Diagnosis not present

## 2020-05-19 DIAGNOSIS — Z86001 Personal history of in-situ neoplasm of cervix uteri: Secondary | ICD-10-CM | POA: Diagnosis not present

## 2020-05-19 DIAGNOSIS — R197 Diarrhea, unspecified: Secondary | ICD-10-CM | POA: Insufficient documentation

## 2020-05-19 DIAGNOSIS — K219 Gastro-esophageal reflux disease without esophagitis: Secondary | ICD-10-CM | POA: Insufficient documentation

## 2020-05-19 DIAGNOSIS — R109 Unspecified abdominal pain: Secondary | ICD-10-CM | POA: Diagnosis not present

## 2020-05-19 LAB — COMPREHENSIVE METABOLIC PANEL
ALT: 43 U/L (ref 0–44)
AST: 35 U/L (ref 15–41)
Albumin: 4.4 g/dL (ref 3.5–5.0)
Alkaline Phosphatase: 90 U/L (ref 38–126)
Anion gap: 10 (ref 5–15)
BUN: 13 mg/dL (ref 6–20)
CO2: 24 mmol/L (ref 22–32)
Calcium: 9.7 mg/dL (ref 8.9–10.3)
Chloride: 103 mmol/L (ref 98–111)
Creatinine, Ser: 0.87 mg/dL (ref 0.44–1.00)
GFR, Estimated: >60 mL/min (ref >60)
Glucose, Bld: 110 mg/dL — ABNORMAL HIGH (ref 70–99)
Potassium: 3.6 mmol/L (ref 3.5–5.1)
Sodium: 137 mmol/L (ref 135–145)
Total Bilirubin: 0.5 mg/dL (ref 0.3–1.2)
Total Protein: 7.7 g/dL (ref 6.5–8.1)

## 2020-05-19 LAB — CBC
HCT: 41.2 % (ref 36.0–46.0)
Hemoglobin: 13.5 g/dL (ref 12.0–15.0)
MCH: 26.6 pg (ref 26.0–34.0)
MCHC: 32.8 g/dL (ref 30.0–36.0)
MCV: 81.3 fL (ref 80.0–100.0)
Platelets: 284 10*3/uL (ref 150–400)
RBC: 5.07 MIL/uL (ref 3.87–5.11)
RDW: 13.7 % (ref 11.5–15.5)
WBC: 9.8 10*3/uL (ref 4.0–10.5)
nRBC: 0 % (ref 0.0–0.2)

## 2020-05-19 LAB — URINALYSIS, ROUTINE W REFLEX MICROSCOPIC
Bilirubin Urine: NEGATIVE
Glucose, UA: NEGATIVE mg/dL
Hgb urine dipstick: NEGATIVE
Ketones, ur: NEGATIVE mg/dL
Leukocytes,Ua: NEGATIVE
Nitrite: NEGATIVE
Protein, ur: NEGATIVE mg/dL
Specific Gravity, Urine: 1.01 (ref 1.005–1.030)
pH: 6.5 (ref 5.0–8.0)

## 2020-05-19 LAB — LIPASE, BLOOD: Lipase: 29 U/L (ref 11–51)

## 2020-05-19 MED ORDER — ALUM & MAG HYDROXIDE-SIMETH 200-200-20 MG/5ML PO SUSP
30.0000 mL | Freq: Once | ORAL | Status: AC
  Administered 2020-05-19: 30 mL via ORAL
  Filled 2020-05-19: qty 30

## 2020-05-19 MED ORDER — SODIUM CHLORIDE 0.9 % IV BOLUS
1000.0000 mL | Freq: Once | INTRAVENOUS | Status: AC
  Administered 2020-05-19: 1000 mL via INTRAVENOUS

## 2020-05-19 MED ORDER — ONDANSETRON HCL 4 MG/2ML IJ SOLN
4.0000 mg | Freq: Once | INTRAMUSCULAR | Status: AC | PRN
  Administered 2020-05-19: 4 mg via INTRAVENOUS
  Filled 2020-05-19: qty 2

## 2020-05-19 MED ORDER — SUCRALFATE 1 GM/10ML PO SUSP: 1.0000 g | Freq: Three times a day (TID) | ORAL | 0 refills | Status: DC

## 2020-05-19 MED ORDER — LIDOCAINE VISCOUS HCL 2 % MT SOLN
15.0000 mL | Freq: Once | OROMUCOSAL | Status: AC
  Administered 2020-05-19: 15 mL via ORAL
  Filled 2020-05-19: qty 15

## 2020-05-19 MED ORDER — PANTOPRAZOLE SODIUM 40 MG PO TBEC: 40.0000 mg | DELAYED_RELEASE_TABLET | Freq: Every day | ORAL | 0 refills | Status: DC

## 2020-05-19 MED ORDER — ONDANSETRON 4 MG PO TBDP: 4.0000 mg | ORAL_TABLET | Freq: Three times a day (TID) | ORAL | 0 refills | Status: DC | PRN

## 2020-05-19 MED ORDER — METOCLOPRAMIDE HCL 5 MG/ML IJ SOLN
5.0000 mg | Freq: Once | INTRAMUSCULAR | Status: AC
  Administered 2020-05-19: 5 mg via INTRAVENOUS
  Filled 2020-05-19: qty 2

## 2020-05-19 MED ORDER — FAMOTIDINE IN NACL 20-0.9 MG/50ML-% IV SOLN
20.0000 mg | Freq: Once | INTRAVENOUS | Status: AC
  Administered 2020-05-19: 20 mg via INTRAVENOUS
  Filled 2020-05-19: qty 50

## 2020-05-19 MED ORDER — FENTANYL CITRATE (PF) 100 MCG/2ML IJ SOLN
50.0000 ug | Freq: Once | INTRAMUSCULAR | Status: AC
  Administered 2020-05-19: 50 ug via INTRAVENOUS
  Filled 2020-05-19: qty 2

## 2020-05-19 MED ORDER — IOHEXOL 300 MG/ML  SOLN
100.0000 mL | Freq: Once | INTRAMUSCULAR | Status: AC | PRN
  Administered 2020-05-19: 100 mL via INTRAVENOUS

## 2020-05-19 NOTE — Discharge Instructions (Addendum)
You were seen in the ER today for abdominal pain.  Your labs, CT, and EKG were reassuring. We are sending you home with the following medications to help with your symptoms:  - Protonix- please take 1 tablet in the morning prior to any meals to help with stomach acidity/pain.  - Carafate- please take prior to each meal and prior to bedtime to help with stomach acidity/pain.  - Zofran- please take every 8 hours as needed for nausea/vomiting.   We have prescribed you new medication(s) today. Discuss the medications prescribed today with your pharmacist as they can have adverse effects and interactions with your other medicines including over the counter and prescribed medications. Seek medical evaluation if you start to experience new or abnormal symptoms after taking one of these medicines, seek care immediately if you start to experience difficulty breathing, feeling of your throat closing, facial swelling, or rash as these could be indications of a more serious allergic reaction  Please follow attached diet guidelines.   Follow up with your primary care provider within 3 days for re-evaluation.  Return to the ER for new or worsening symptoms including but not limited to worsened pain, new pain, inability to keep fluids down, blood in vomit/stool, passing out, or any other concerns.
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
## 2020-05-19 NOTE — ED Notes (Signed)
Pt tolerated PO.

## 2020-05-19 NOTE — ED Triage Notes (Signed)
Reports upper abdominal pain that started this morning with nausea and dry heaves.  Endorses having her hysterectomy 8 weeks ago.

## 2020-05-20 ENCOUNTER — Other Ambulatory Visit (HOSPITAL_COMMUNITY): Payer: Self-pay | Admitting: Obstetrics and Gynecology

## 2020-05-28 DIAGNOSIS — J3081 Allergic rhinitis due to animal (cat) (dog) hair and dander: Secondary | ICD-10-CM | POA: Diagnosis not present

## 2020-05-28 DIAGNOSIS — J3089 Other allergic rhinitis: Secondary | ICD-10-CM | POA: Diagnosis not present

## 2020-05-28 DIAGNOSIS — J301 Allergic rhinitis due to pollen: Secondary | ICD-10-CM | POA: Diagnosis not present

## 2020-05-29 ENCOUNTER — Telehealth (INDEPENDENT_AMBULATORY_CARE_PROVIDER_SITE_OTHER): Payer: 59 | Admitting: Medical

## 2020-05-29 ENCOUNTER — Encounter: Payer: Self-pay | Admitting: Medical

## 2020-05-29 VITALS — Wt 225.0 lb

## 2020-05-29 DIAGNOSIS — Z90710 Acquired absence of both cervix and uterus: Secondary | ICD-10-CM | POA: Diagnosis not present

## 2020-05-29 DIAGNOSIS — K219 Gastro-esophageal reflux disease without esophagitis: Secondary | ICD-10-CM | POA: Diagnosis not present

## 2020-05-29 DIAGNOSIS — R1013 Epigastric pain: Secondary | ICD-10-CM

## 2020-05-29 NOTE — Progress Notes (Signed)
Subjective:     Patient ID: Michelle Griffin, female   DOB: 28-Dec-1970, 49 y.o.   MRN: [id number]  This visit type was conducted due to national recommendations for restrictions regarding the COVID-19 Pandemic (e.g. social distancing) in an effort to limit this patient's exposure and mitigate transmission in our community.  Due to their co-morbid illnesses, this patient is at least at moderate risk for complications without adequate follow up.  This format is felt to be most appropriate for this patient at this time.    Documentation for virtual audio and video telecommunications through [place] encounter:  The patient was located at home. The provider was located in the office. The patient did consent to this visit and is aware of possible charges through their insurance for this visit.  The other persons participating in this telemedicine service were none. Time spent on call was 20 minutes and in review of previous records 20 minutes total.  This virtual service is not related to other E/M service within previous 7 days.   HPI Chief Complaint  Patient presents with  . other    Stomach ulcer seen at med center [facility] 05/19/20 CT scan see's GI 07/30/20    Virtual consult today for abdominal pain.  She just recently had complete hysterectomy in October for cancer cells of the cervix in situ.  She was put on estrogen patch recently due to hot flashes and menopausal symptoms.  She has been on that 10 days.  She ended up not having to have chemotherapy or radiation.  She is also status post cholecystectomy from prior  Her recent symptoms that took her to [facility] med center were significant epigastric pain that was doubling her liver.  They suspected an ulcer after the CT and labs are unremarkable.  She was prescribed Protonix, sucralfate and Zofran.  She took Zofran and sucralfate for several days then stopped it.  She continues on the Protonix and is doing much better.  She has not taken  ibuprofen several weeks although it is listed in her chart  She did call her gastroenterologist, Dr. Ewing Schlein.  He is booked up but plans to see her in March for an appointment  Otherwise in normal state of health   No other aggravating or relieving factors. No other complaint.   Review of Systems As in subjective    Objective:   Physical Exam Due to coronavirus pandemic stay at home measures, patient visit was virtual and they were not examined in person.   Wt 225 lb (102.1 kg)   BMI 32.28 kg/m   Gen: Well-developed well-nourished no acute stress     Assessment:     Encounter Diagnoses  Name Primary?  . Gastroesophageal reflux disease, unspecified whether esophagitis present Yes  . Epigastric pain   . S/P hysterectomy        Plan:     We discussed her concerns and symptoms.  She is improving from the last few days.  I reviewed her emergency department note from 05/19/2020 at [facility].  I reviewed the labs and CT abdomen pelvis.  Labs showed elevated blood sugar but she was likely nonfasting otherwise labs were fine.  CT showed some free fluid but she just recently had complete hysterectomy otherwise normal CT  There is the possibility of gastritis versus peptic ulcer disease.  She is only currently taking Protonix.  She was on sucralfate last week but has stopped that.  She has not needed the Zofran  that was prescribed.  All 3 of these medicines are new  I recommended she take Protonix for the next month to finish out the 30-day prescription.  I advised that she may want to do the sucralfate a few more days and add Pepto-Bismol twice daily for about 5 to 7 days.  We discussed avoiding trigger foods such as spicy foods, acidic foods, peppers.  She is not currently taking ibuprofen listed in the chart and advised to avoid NSAIDs due to the risk as well.  As long as she continues to improve then she can let me know if she needs refills on any of these medications.  She  does have GI follow-up planned with Dr. Ewing Schlein in March and will look into both EGD and colonoscopy at that time  Dainelle was seen today for other.  Diagnoses and all orders for this visit:  Gastroesophageal reflux disease, unspecified whether esophagitis present  Epigastric pain  S/P hysterectomy     Follow-up as needed otherwise

## 2020-06-06 DIAGNOSIS — Z20822 Contact with and (suspected) exposure to covid-19: Secondary | ICD-10-CM | POA: Diagnosis not present

## 2020-06-06 DIAGNOSIS — Z1159 Encounter for screening for other viral diseases: Secondary | ICD-10-CM | POA: Diagnosis not present

## 2020-06-06 DIAGNOSIS — Z20828 Contact with and (suspected) exposure to other viral communicable diseases: Secondary | ICD-10-CM | POA: Diagnosis not present

## 2020-06-11 ENCOUNTER — Telehealth: Payer: Self-pay | Admitting: Family Medicine

## 2020-06-11 ENCOUNTER — Other Ambulatory Visit: Payer: Self-pay | Admitting: Medical

## 2020-06-11 MED ORDER — PANTOPRAZOLE SODIUM 40 MG PO TBEC: 40.0000 mg | DELAYED_RELEASE_TABLET | Freq: Every day | ORAL | 0 refills | Status: DC

## 2020-06-11 MED FILL — ESTRADIOL 0.1 MG/24HR PTTW: 0.1 | 84 days supply | Qty: 24 | Fill #0

## 2020-06-11 NOTE — Telephone Encounter (Signed)
Pt requesting refill on Protonix to [facility].

## 2020-06-12 DIAGNOSIS — J3081 Allergic rhinitis due to animal (cat) (dog) hair and dander: Secondary | ICD-10-CM | POA: Diagnosis not present

## 2020-06-12 DIAGNOSIS — J3089 Other allergic rhinitis: Secondary | ICD-10-CM | POA: Diagnosis not present

## 2020-06-12 DIAGNOSIS — J301 Allergic rhinitis due to pollen: Secondary | ICD-10-CM | POA: Diagnosis not present

## 2020-06-13 MED FILL — PANTOPRAZOLE SOD DR 40 MG T: 40 | 90 days supply | Qty: 90 | Fill #0

## 2020-06-24 MED FILL — SERTRALINE HCL 100 MG TABS: 100 | 90 days supply | Qty: 90 | Fill #0

## 2020-06-27 DIAGNOSIS — J301 Allergic rhinitis due to pollen: Secondary | ICD-10-CM | POA: Diagnosis not present

## 2020-06-27 DIAGNOSIS — J3081 Allergic rhinitis due to animal (cat) (dog) hair and dander: Secondary | ICD-10-CM | POA: Diagnosis not present

## 2020-06-27 DIAGNOSIS — J3089 Other allergic rhinitis: Secondary | ICD-10-CM | POA: Diagnosis not present

## 2020-07-01 ENCOUNTER — Other Ambulatory Visit: Payer: Self-pay | Admitting: Obstetrics and Gynecology

## 2020-07-01 DIAGNOSIS — Z1231 Encounter for screening mammogram for malignant neoplasm of breast: Secondary | ICD-10-CM

## 2020-07-03 ENCOUNTER — Other Ambulatory Visit: Payer: Self-pay

## 2020-07-03 ENCOUNTER — Telehealth: Payer: 59 | Admitting: Medical

## 2020-07-03 ENCOUNTER — Encounter: Payer: Self-pay | Admitting: Medical

## 2020-07-03 VITALS — Ht 70.0 in | Wt 230.0 lb

## 2020-07-03 DIAGNOSIS — F430 Acute stress reaction: Secondary | ICD-10-CM | POA: Diagnosis not present

## 2020-07-03 DIAGNOSIS — G4700 Insomnia, unspecified: Secondary | ICD-10-CM | POA: Diagnosis not present

## 2020-07-03 MED ORDER — TRAZODONE HCL 50 MG PO TABS: 25.0000 mg | ORAL_TABLET | Freq: Every evening | ORAL | 1 refills | Status: DC | PRN

## 2020-07-03 NOTE — Progress Notes (Signed)
Subjective:     Patient ID: Michelle Griffin, female   DOB: 07-12-1970, 50 y.o.   MRN: [id number]  This visit type was conducted due to national recommendations for restrictions regarding the COVID-19 Pandemic (e.g. social distancing) in an effort to limit this patient's exposure and mitigate transmission in our community.  Due to their co-morbid illnesses, this patient is at least at moderate risk for complications without adequate follow up.  This format is felt to be most appropriate for this patient at this time.    Documentation for virtual audio and video telecommunications through [place] encounter:  The patient was located at home. The provider was located in the office. The patient did consent to this visit and is aware of possible charges through their insurance for this visit.  The other persons participating in this telemedicine service were none. Time spent on call was 20 minutes and in review of previous records 20 minutes total.  This virtual service is not related to other E/M service within previous 7 days.   HPI Chief Complaint  Patient presents with  . Insomnia   Virtual consult for sleep concern.    Having trouble sleeping.  recently had hysterectomy, subsequent was having hot flashes.  Has been using hormone patch without not helping her sleep currently..    In the last month, having trouble getting to sleep.  Only getting 3-4 hours of sleep per night.  Has tried melatonin, benadryl sleep aid.    Parents and sister live in [place], sister has lost lots of weight, 60+ pounds, and now in hospital at [place] inpatient.  This is really weighing on her currently  Has used Ambien 15 years ago.  Had crazy dreams on this.   No prior trazodone.     No other aggravating or relieving factors. No other complaint.  Past Medical History:  Diagnosis Date  . Adenocarcinoma in situ of cervix   . Allergy    sees Dr. Donneta Griffin  . Anemia    hx/o iron deficiency due to  heavy periods  . Asthma    exercise induced  . Bursitis   . Cancer ([place])    cervical  . COVID-19 05/26/2019   fatigue, h/a, loss of taste and smell, fatige x 1 month, loss of taste x 5 months all symptoms rsolved  . DDD (degenerative disc disease), lumbar   . Fatty liver   . GERD (gastroesophageal reflux disease)    Dr. Watt Griffin  . H/O mammogram   . Hiatal hernia    small per EGD, Dr. Watt Griffin  . Hyperlipidemia 2012   was on pravastatin prior  . Migraine    prior eval with neurologist, Dr. Melton Griffin  . Overactive bladder   . PMDD (premenstrual dysphoric disorder)   . Pyelonephritis    age 35yo  . Uterine infection 2015  . Vitamin D deficiency   . Wears glasses    Current Outpatient Medications on File Prior to Visit  Medication Sig Dispense Refill  . azelastine (OPTIVAR) 0.05 % ophthalmic solution Place 1 drop into both eyes daily as needed (allergies).    . cholecalciferol (VITAMIN D3) 25 MCG (1000 UNIT) tablet Take 1 tablet (1,000 Units total) by mouth daily. 90 tablet 3  . EPINEPHrine (EPI-PEN) 0.3 mg/0.3 mL SOAJ injection Inject 0.3 mg into the muscle as needed for anaphylaxis.     . famotidine (PEPCID) 20 MG tablet Take 20 mg by mouth daily.    . fluticasone (FLONASE) 50 MCG/ACT nasal spray  Place 1 spray into both nostrils daily.     [name] ibuprofen (ADVIL) 800 MG tablet Take 1 tablet (800 mg total) by mouth every 8 (eight) hours. 30 tablet 0  . levocetirizine (XYZAL) 5 MG tablet Take 5 mg by mouth every evening.    . montelukast (SINGULAIR) 10 MG tablet Take 10 mg by mouth at bedtime.    . pantoprazole (PROTONIX) 40 MG tablet Take 1 tablet (40 mg total) by mouth daily. 90 tablet 0  . Probiotic Product (RESTORA) CAPS Take 1 capsule by mouth daily. 90 capsule 3  . sertraline (ZOLOFT) 100 MG tablet TAKE 1 TABLET (100 MG TOTAL) BY MOUTH DAILY. 90 tablet 0  . estradiol (VIVELLE-DOT) 0.1 MG/24HR patch estradiol 0.1 mg/24 hr semiweekly transdermal patch  APPLY 1 PATCH ONTO THE SKIN TWICE A  WEEK    . ondansetron (ZOFRAN ODT) 4 MG disintegrating tablet Take 1 tablet (4 mg total) by mouth every 8 (eight) hours as needed for nausea or vomiting. (Patient not taking: No sig reported) 8 tablet 0  . sucralfate (CARAFATE) 1 GM/10ML suspension Take 10 mLs (1 g total) by mouth 4 (four) times daily -  with meals and at bedtime. (Patient not taking: No sig reported) 420 mL 0   No current facility-administered medications on file prior to visit.     Review of Systems As in subjective    Objective:   Physical Exam Due to coronavirus pandemic stay at home measures, patient visit was virtual and they were not examined in person.   Ht 5\' 10"  (1.778 m)   Wt 230 lb (104.3 kg)   BMI 33.00 kg/m   Gen: wd, wn, [place], good eye contact, answers questions appropriately      Assessment:     Encounter Diagnoses  Name Primary?  . Insomnia, unspecified type Yes  . Acute stress reaction        Plan:     We discussed sleep hygiene, discussed her recent stressors, discussed the surgical menopause symptoms.  At this point she will begin trial of trazodone.  We discussed other options.  Hopefully she will need this short-term.  Hydrate well daily.  If not working in the next 2 weeks, consider nightly clonidine trial  Michelle Griffin was seen today for insomnia.  Diagnoses and all orders for this visit:  Insomnia, unspecified type  Acute stress reaction  Other orders -     traZODone (DESYREL) 50 MG tablet; Take 0.5-1 tablets (25-50 mg total) by mouth at bedtime as needed for sleep.  Follow-up 2 weeks

## 2020-07-04 DIAGNOSIS — J3089 Other allergic rhinitis: Secondary | ICD-10-CM | POA: Diagnosis not present

## 2020-07-04 DIAGNOSIS — J3081 Allergic rhinitis due to animal (cat) (dog) hair and dander: Secondary | ICD-10-CM | POA: Diagnosis not present

## 2020-07-04 DIAGNOSIS — J301 Allergic rhinitis due to pollen: Secondary | ICD-10-CM | POA: Diagnosis not present

## 2020-07-14 ENCOUNTER — Other Ambulatory Visit (HOSPITAL_COMMUNITY): Payer: Self-pay | Admitting: Allergy

## 2020-07-15 DIAGNOSIS — J301 Allergic rhinitis due to pollen: Secondary | ICD-10-CM | POA: Diagnosis not present

## 2020-07-15 MED FILL — FLUTICASONE PROP 50 MCG SPR: 50 | 90 days supply | Qty: 48 | Fill #0

## 2020-07-16 DIAGNOSIS — J3089 Other allergic rhinitis: Secondary | ICD-10-CM | POA: Diagnosis not present

## 2020-07-16 DIAGNOSIS — J3081 Allergic rhinitis due to animal (cat) (dog) hair and dander: Secondary | ICD-10-CM | POA: Diagnosis not present

## 2020-07-19 ENCOUNTER — Other Ambulatory Visit (HOSPITAL_COMMUNITY): Payer: Self-pay | Admitting: Allergy

## 2020-07-19 DIAGNOSIS — J3089 Other allergic rhinitis: Secondary | ICD-10-CM | POA: Diagnosis not present

## 2020-07-19 DIAGNOSIS — J3081 Allergic rhinitis due to animal (cat) (dog) hair and dander: Secondary | ICD-10-CM | POA: Diagnosis not present

## 2020-07-19 DIAGNOSIS — J301 Allergic rhinitis due to pollen: Secondary | ICD-10-CM | POA: Diagnosis not present

## 2020-07-19 MED FILL — LEVOCETIRIZINE 5 MG TABLET: 5 | 90 days supply | Qty: 90 | Fill #0

## 2020-07-19 MED FILL — MONTELUKAST SOD 10 MG TAB: 10 | 90 days supply | Qty: 90 | Fill #0

## 2020-07-25 ENCOUNTER — Other Ambulatory Visit: Payer: Self-pay

## 2020-07-25 ENCOUNTER — Ambulatory Visit
Admission: RE | Admit: 2020-07-25 | Discharge: 2020-07-25 | Disposition: A | Discharge status: Home / Self Care | Payer: 59 | Source: Ambulatory Visit

## 2020-07-25 DIAGNOSIS — Z1231 Encounter for screening mammogram for malignant neoplasm of breast: Secondary | ICD-10-CM

## 2020-07-25 MED FILL — VITAMIN D3 25 MCG (1000 UT): 25 MCG | 60 days supply | Qty: 60 | Fill #3

## 2020-07-30 DIAGNOSIS — Z8371 Family history of colonic polyps: Secondary | ICD-10-CM | POA: Diagnosis not present

## 2020-07-30 DIAGNOSIS — J301 Allergic rhinitis due to pollen: Secondary | ICD-10-CM | POA: Diagnosis not present

## 2020-07-30 DIAGNOSIS — J3081 Allergic rhinitis due to animal (cat) (dog) hair and dander: Secondary | ICD-10-CM | POA: Diagnosis not present

## 2020-07-30 DIAGNOSIS — R1013 Epigastric pain: Secondary | ICD-10-CM | POA: Diagnosis not present

## 2020-07-30 DIAGNOSIS — J3089 Other allergic rhinitis: Secondary | ICD-10-CM | POA: Diagnosis not present

## 2020-07-30 DIAGNOSIS — Z800 Family history of malignant neoplasm of digestive organs: Secondary | ICD-10-CM | POA: Diagnosis not present

## 2020-08-14 DIAGNOSIS — J3081 Allergic rhinitis due to animal (cat) (dog) hair and dander: Secondary | ICD-10-CM | POA: Diagnosis not present

## 2020-08-14 DIAGNOSIS — J3089 Other allergic rhinitis: Secondary | ICD-10-CM | POA: Diagnosis not present

## 2020-08-14 DIAGNOSIS — J301 Allergic rhinitis due to pollen: Secondary | ICD-10-CM | POA: Diagnosis not present

## 2020-08-22 ENCOUNTER — Other Ambulatory Visit (HOSPITAL_BASED_OUTPATIENT_CLINIC_OR_DEPARTMENT_OTHER): Payer: Self-pay

## 2020-08-27 MED FILL — ESTRADIOL 0.1 MG/24HR PTTW: 0.1 | 84 days supply | Qty: 24 | Fill #1

## 2020-08-30 DIAGNOSIS — J3089 Other allergic rhinitis: Secondary | ICD-10-CM | POA: Diagnosis not present

## 2020-08-30 DIAGNOSIS — J301 Allergic rhinitis due to pollen: Secondary | ICD-10-CM | POA: Diagnosis not present

## 2020-08-30 DIAGNOSIS — J3081 Allergic rhinitis due to animal (cat) (dog) hair and dander: Secondary | ICD-10-CM | POA: Diagnosis not present

## 2020-09-03 ENCOUNTER — Other Ambulatory Visit (HOSPITAL_COMMUNITY): Payer: Self-pay

## 2020-09-05 ENCOUNTER — Other Ambulatory Visit: Payer: Self-pay | Admitting: Medical

## 2020-09-05 ENCOUNTER — Other Ambulatory Visit (HOSPITAL_COMMUNITY): Payer: Self-pay

## 2020-09-05 MED ORDER — SERTRALINE HCL 100 MG PO TABS
ORAL_TABLET | Freq: Every day | ORAL | 0 refills | Status: DC
  Filled 2020-09-05: qty 90, 90d supply, fill #0

## 2020-09-05 MED ORDER — PANTOPRAZOLE SODIUM 40 MG PO TBEC
DELAYED_RELEASE_TABLET | Freq: Every day | ORAL | 0 refills | Status: DC
  Filled 2020-09-05: qty 90, 90d supply, fill #0

## 2020-09-05 MED ORDER — VITAMIN D3 25 MCG (1000 UNIT) PO TABS
ORAL_TABLET | Freq: Every day | ORAL | 0 refills | Status: DC
  Filled 2020-09-05: qty 90, 90d supply, fill #0

## 2020-09-06 ENCOUNTER — Other Ambulatory Visit (HOSPITAL_COMMUNITY): Payer: Self-pay

## 2020-09-10 ENCOUNTER — Other Ambulatory Visit (HOSPITAL_COMMUNITY): Payer: Self-pay

## 2020-09-19 DIAGNOSIS — J3081 Allergic rhinitis due to animal (cat) (dog) hair and dander: Secondary | ICD-10-CM | POA: Diagnosis not present

## 2020-09-19 DIAGNOSIS — J3089 Other allergic rhinitis: Secondary | ICD-10-CM | POA: Diagnosis not present

## 2020-09-19 DIAGNOSIS — J301 Allergic rhinitis due to pollen: Secondary | ICD-10-CM | POA: Diagnosis not present

## 2020-10-04 DIAGNOSIS — J3081 Allergic rhinitis due to animal (cat) (dog) hair and dander: Secondary | ICD-10-CM | POA: Diagnosis not present

## 2020-10-04 DIAGNOSIS — J3089 Other allergic rhinitis: Secondary | ICD-10-CM | POA: Diagnosis not present

## 2020-10-04 DIAGNOSIS — J301 Allergic rhinitis due to pollen: Secondary | ICD-10-CM | POA: Diagnosis not present

## 2020-10-11 DIAGNOSIS — K2930 Chronic superficial gastritis without bleeding: Secondary | ICD-10-CM | POA: Diagnosis not present

## 2020-10-11 DIAGNOSIS — K219 Gastro-esophageal reflux disease without esophagitis: Secondary | ICD-10-CM | POA: Diagnosis not present

## 2020-10-11 DIAGNOSIS — R1013 Epigastric pain: Secondary | ICD-10-CM | POA: Diagnosis not present

## 2020-10-11 DIAGNOSIS — Z8371 Family history of colonic polyps: Secondary | ICD-10-CM | POA: Diagnosis not present

## 2020-10-11 DIAGNOSIS — K449 Diaphragmatic hernia without obstruction or gangrene: Secondary | ICD-10-CM | POA: Diagnosis not present

## 2020-10-11 DIAGNOSIS — K2950 Unspecified chronic gastritis without bleeding: Secondary | ICD-10-CM | POA: Diagnosis not present

## 2020-10-11 DIAGNOSIS — K2100 Gastro-esophageal reflux disease with esophagitis, without bleeding: Secondary | ICD-10-CM | POA: Diagnosis not present

## 2020-10-11 DIAGNOSIS — K635 Polyp of colon: Secondary | ICD-10-CM | POA: Diagnosis not present

## 2020-10-11 LAB — HM COLONOSCOPY

## 2020-10-16 DIAGNOSIS — J3081 Allergic rhinitis due to animal (cat) (dog) hair and dander: Secondary | ICD-10-CM | POA: Diagnosis not present

## 2020-10-16 DIAGNOSIS — J301 Allergic rhinitis due to pollen: Secondary | ICD-10-CM | POA: Diagnosis not present

## 2020-10-16 DIAGNOSIS — J3089 Other allergic rhinitis: Secondary | ICD-10-CM | POA: Diagnosis not present

## 2020-10-18 ENCOUNTER — Other Ambulatory Visit (HOSPITAL_COMMUNITY): Payer: Self-pay

## 2020-10-18 MED FILL — Levocetirizine Dihydrochloride Tab 5 MG: ORAL | 90 days supply | Qty: 90 | Fill #0 | Status: AC

## 2020-10-18 MED FILL — Montelukast Sodium Tab 10 MG (Base Equiv): ORAL | 90 days supply | Qty: 90 | Fill #0 | Status: AC

## 2020-10-23 DIAGNOSIS — J3089 Other allergic rhinitis: Secondary | ICD-10-CM | POA: Diagnosis not present

## 2020-10-23 DIAGNOSIS — J301 Allergic rhinitis due to pollen: Secondary | ICD-10-CM | POA: Diagnosis not present

## 2020-10-23 DIAGNOSIS — J3081 Allergic rhinitis due to animal (cat) (dog) hair and dander: Secondary | ICD-10-CM | POA: Diagnosis not present

## 2020-11-04 ENCOUNTER — Encounter: Payer: Self-pay | Admitting: Medical

## 2020-11-05 ENCOUNTER — Other Ambulatory Visit: Payer: Self-pay

## 2020-11-05 ENCOUNTER — Encounter: Payer: Self-pay | Admitting: Medical

## 2020-11-05 ENCOUNTER — Ambulatory Visit (INDEPENDENT_AMBULATORY_CARE_PROVIDER_SITE_OTHER): Payer: 59 | Admitting: Medical

## 2020-11-05 VITALS — BP 130/86 | HR 83 | Ht 70.0 in | Wt 234.0 lb

## 2020-11-05 DIAGNOSIS — N3281 Overactive bladder: Secondary | ICD-10-CM

## 2020-11-05 DIAGNOSIS — R7301 Impaired fasting glucose: Secondary | ICD-10-CM | POA: Diagnosis not present

## 2020-11-05 DIAGNOSIS — K760 Fatty (change of) liver, not elsewhere classified: Secondary | ICD-10-CM | POA: Diagnosis not present

## 2020-11-05 DIAGNOSIS — K219 Gastro-esophageal reflux disease without esophagitis: Secondary | ICD-10-CM

## 2020-11-05 DIAGNOSIS — Z13220 Encounter for screening for lipoid disorders: Secondary | ICD-10-CM

## 2020-11-05 DIAGNOSIS — Z0000 Encounter for general adult medical examination without abnormal findings: Secondary | ICD-10-CM

## 2020-11-05 DIAGNOSIS — J4530 Mild persistent asthma, uncomplicated: Secondary | ICD-10-CM | POA: Diagnosis not present

## 2020-11-05 DIAGNOSIS — Z7185 Encounter for immunization safety counseling: Secondary | ICD-10-CM

## 2020-11-05 DIAGNOSIS — L989 Disorder of the skin and subcutaneous tissue, unspecified: Secondary | ICD-10-CM

## 2020-11-05 DIAGNOSIS — E669 Obesity, unspecified: Secondary | ICD-10-CM

## 2020-11-05 DIAGNOSIS — E785 Hyperlipidemia, unspecified: Secondary | ICD-10-CM | POA: Diagnosis not present

## 2020-11-05 DIAGNOSIS — J301 Allergic rhinitis due to pollen: Secondary | ICD-10-CM | POA: Diagnosis not present

## 2020-11-05 DIAGNOSIS — Z90710 Acquired absence of both cervix and uterus: Secondary | ICD-10-CM | POA: Diagnosis not present

## 2020-11-05 DIAGNOSIS — Z23 Encounter for immunization: Secondary | ICD-10-CM

## 2020-11-05 DIAGNOSIS — Z131 Encounter for screening for diabetes mellitus: Secondary | ICD-10-CM

## 2020-11-05 DIAGNOSIS — E559 Vitamin D deficiency, unspecified: Secondary | ICD-10-CM | POA: Diagnosis not present

## 2020-11-05 DIAGNOSIS — F3281 Premenstrual dysphoric disorder: Secondary | ICD-10-CM

## 2020-11-05 DIAGNOSIS — G4700 Insomnia, unspecified: Secondary | ICD-10-CM

## 2020-11-05 DIAGNOSIS — M5137 Other intervertebral disc degeneration, lumbosacral region: Secondary | ICD-10-CM

## 2020-11-05 NOTE — Progress Notes (Signed)
Subjective:   HPI  Michelle Griffin is a 50 y.o. female who presents for Chief Complaint  Patient presents with  . Annual Exam    Physical with fasting labs.     Patient Care Team: , Leward Quan as PCP - General (Family Medicine) Sees dentist Sees eye doctor Dr. Clarene Essex, GI Dr. Paula Compton, [facility] Dr. Mosetta Anis, allergist   Concerns: Weight, efforts to lose weight   Reviewed their medical, surgical, family, social, medication, and allergy history and updated chart as appropriate.   Past Medical History:  Diagnosis Date  . Adenocarcinoma in situ of cervix   . Allergy    sees [name]  . Anemia    hx/o iron deficiency due to heavy periods  . Asthma    exercise induced  . Bursitis   . Cancer ([place])    cervical  . COVID-19 05/26/2019   fatigue, h/a, loss of taste and smell, fatige x 1 month, loss of taste x 5 months all symptoms rsolved  . DDD (degenerative disc disease), lumbar   . Fatty liver   . GERD (gastroesophageal reflux disease)    [name]  . H/O mammogram   . Hiatal hernia    small per EGD, [name]  . Hyperlipidemia 2012   was on pravastatin prior  . Migraine    prior eval with neurologist, Dr. Melton Alar  . Overactive bladder   . PMDD (premenstrual dysphoric disorder)   . Pyelonephritis    age 62yo  . Uterine infection 2015  . Vitamin D deficiency   . Wears glasses     Family History  Problem Relation Age of Onset  . Lupus Mother   . Hypertension Mother   . Hyperlipidemia Mother   . Migraines Mother   . Kidney disease Mother   . Hypertension Father   . Diabetes Father   . Hyperlipidemia Father   . Depression Father   . Anxiety disorder Father   . Dementia Father   . Obesity Sister   . Hypertension Sister   . Diabetes Sister   . Heart disease Paternal Uncle        CABG  . Hyperlipidemia Paternal Uncle   . Cancer Paternal Uncle        multiple with colon cancer  . Cancer Cousin        breast  .  Cancer Cousin        ovarian  . Ovarian cysts Daughter   . Stroke Neg Hx      Current Outpatient Medications:  .  Azelastine HCl 137 MCG/SPRAY SOLN, INSTILL 1 TO 2 SPRAYS INTO EACH NOSTRIL TWICE DAILY, Disp: 90 mL, Rfl: 2 .  cholecalciferol (VITAMIN D) 25 MCG (1000 UNIT) tablet, TAKE 1 TABLET BY MOUTH ONCE DAILY, Disp: 90 tablet, Rfl: 0 .  EPINEPHrine (EPI-PEN) 0.3 mg/0.3 mL SOAJ injection, Inject 0.3 mg into the muscle as needed for anaphylaxis. , Disp: , Rfl:  .  estradiol (VIVELLE-DOT) 0.1 MG/24HR patch, APPLY 1 PATCH ON TOPICALLY 2 TIMES A WEEK, Disp: 24 patch, Rfl: 5 .  fluticasone (FLONASE) 50 MCG/ACT nasal spray, PLACE 1 - 2 SPRAYS INTO EACH NOSTRIL ONCE OR TWICE DAILY AS DIRECTED, Disp: 48 g, Rfl: 2 .  ibuprofen (ADVIL) 800 MG tablet, TAKE 1 TABLET BY MOUTH EVERY 8 HOURS, Disp: 30 tablet, Rfl: 0 .  levocetirizine (XYZAL) 5 MG tablet, TAKE 1 TABLET BY MOUTH EVERY EVENING, Disp: 90 tablet, Rfl: 2 .  montelukast (SINGULAIR)  10 MG tablet, Take 10 mg by mouth at bedtime., Disp: , Rfl:  .  montelukast (SINGULAIR) 10 MG tablet, TAKE 1 TABLET BY MOUTH EVERY EVENING, Disp: 90 tablet, Rfl: 2 .  pantoprazole (PROTONIX) 40 MG tablet, TAKE 1 TABLET BY MOUTH DAILY, Disp: 90 tablet, Rfl: 0 .  Probiotic Product (RESTORA) CAPS, Take 1 capsule by mouth daily., Disp: 90 capsule, Rfl: 3 .  sertraline (ZOLOFT) 100 MG tablet, TAKE 1 TABLET BY MOUTH DAILY., Disp: 90 tablet, Rfl: 0 .  traZODone (DESYREL) 50 MG tablet, Take 0.5-1 tablets (25-50 mg total) by mouth at bedtime as needed for sleep., Disp: 30 tablet, Rfl: 1 .  ondansetron (ZOFRAN ODT) 4 MG disintegrating tablet, Take 1 tablet (4 mg total) by mouth every 8 (eight) hours as needed for nausea or vomiting. (Patient not taking: No sig reported), Disp: 8 tablet, Rfl: 0  Allergies  Allergen Reactions  . Sulfa Antibiotics Hives  . Ambien [Zolpidem]     Nightmares/vivid dreams  . Percocet [Oxycodone-Acetaminophen] Itching    Oxycodone " makes me climb  the wall, itching"      Review of Systems Constitutional: -fever, -chills, -sweats, -unexpected weight change, -decreased appetite, -fatigue Allergy: -sneezing, -itching, -congestion Dermatology: -changing moles, --rash, -lumps ENT: -runny nose, -ear pain, -sore throat, -hoarseness, -sinus pain, -teeth pain, - ringing in ears, -hearing loss, -nosebleeds Cardiology: -chest pain, -palpitations, -swelling, -difficulty breathing when lying flat, -waking up short of breath Respiratory: -cough, -shortness of breath, -difficulty breathing with exercise or exertion, -wheezing, -coughing up blood Gastroenterology: -abdominal pain, -nausea, -vomiting, -diarrhea, -constipation, -blood in stool, -changes in bowel movement, -difficulty swallowing or eating Hematology: -bleeding, -bruising  Musculoskeletal: -joint aches, -muscle aches, -joint swelling, -back pain, -neck pain, -cramping, -changes in gait Ophthalmology: denies vision changes, eye redness, itching, discharge Urology: -burning with urination, -difficulty urinating, -blood in urine, -urinary frequency, -urgency, -incontinence Neurology: -headache, -weakness, -tingling, -numbness, -memory loss, -falls, -dizziness Psychology: -depressed mood, -agitation, -sleep problems Breast/gyn: -breast tendnerss, -discharge, -lumps, -vaginal discharge,- irregular periods, -heavy periods   Depression screen [facility] 2/9 11/05/2020 09/08/2019 06/16/2018  Decreased Interest 0 0 0  Down, Depressed, Hopeless 0 0 0  PHQ - 2 Score 0 0 0  Some encounter information is confidential and restricted. Go to Review Flowsheets activity to see all data.       Objective:  BP 130/86   Pulse 83   Ht 5' 10" (1.778 m)   Wt 234 lb (106.1 kg)   LMP 03/16/2015   SpO2 97%   BMI 33.58 kg/m    BP Readings from Last 3 Encounters:  11/05/20 130/86  05/19/20 120/82  03/21/20 128/80   Wt Readings from Last 3 Encounters:  11/05/20 234 lb (106.1 kg)  07/03/20 230 lb (104.3 kg)   05/29/20 225 lb (102.1 kg)     General appearance: alert, no distress, WD/WN, Caucasian female Skin: right lateral lower leg with small crusting wound with 2cm of surrounding pink/red coloration, no warmth, no drainage, no fluctuation, no other worrisome lesions HEENT: normocephalic, conjunctiva/corneas normal, sclerae anicteric, PERRLA, EOMi, nares patent, no discharge or erythema, pharynx normal Oral cavity: MMM, tongue normal, teeth normal Neck: supple, no lymphadenopathy, no thyromegaly, no masses, normal ROM, no bruits Chest: non tender, normal shape and expansion Heart: RRR, normal S1, S2, no murmurs Lungs: CTA bilaterally, no wheezes, rhonchi, or rales Abdomen: +bs, soft, non tender, non distended, no masses, no hepatomegaly, no splenomegaly, no bruits Back: non tender, normal ROM, no scoliosis Musculoskeletal: upper extremities non  tender, no obvious deformity, normal ROM throughout, lower extremities non tender, no obvious deformity, normal ROM throughout Extremities: no edema, no cyanosis, no clubbing Pulses: 2+ symmetric, upper and lower extremities, normal cap refill Neurological: alert, oriented x 3, CN2-12 intact, strength normal upper extremities and lower extremities, sensation normal throughout, DTRs 2+ throughout, no cerebellar signs, gait normal Psychiatric: normal affect, behavior normal, pleasant  Breast/gyn/rectal - deferred to gynecology    Assessment and Plan :   Encounter Diagnoses  Name Primary?  . Encounter for health maintenance examination in adult Yes  . S/P hysterectomy   . Vaccine counseling   . Need for pneumococcal vaccination   . Screening for lipid disorders   . Screening for diabetes mellitus   . Vitamin D deficiency   . Mild persistent asthma without complication   . Allergic rhinitis due to pollen, unspecified seasonality   . Fatty liver   . Gastroesophageal reflux disease, unspecified whether esophagitis present   . Impaired fasting blood  sugar   . OAB (overactive bladder)   . DEGENERATIVE DISC DISEASE, LUMBAR SPINE   . Hyperlipidemia, unspecified hyperlipidemia type   . PMDD (premenstrual dysphoric disorder)   . Obesity with serious comorbidity, unspecified classification, unspecified obesity type   . Insomnia, unspecified type   . S/P laparoscopic assisted vaginal hysterectomy (LAVH)      This visit was a preventative care visit, also known as wellness visit or routine physical.   Topics typically include healthy lifestyle, diet, exercise, preventative care, vaccinations, sick and well care, proper use of emergency dept and after hours care, as well as other concerns.     Recommendations: Continue to return yearly for your annual wellness and preventative care visits.  This gives [name] a chance to discuss healthy lifestyle, exercise, vaccinations, review your chart record, and perform screenings where appropriate.  I recommend you see your eye doctor yearly for routine vision care.  I recommend you see your dentist yearly for routine dental care including hygiene visits twice yearly.   Vaccination recommendations were reviewed Immunization History  Administered Date(s) Administered  . Hepatitis B 12/05/1973, 01/14/1994, 06/30/1994  . Influenza Split 03/11/2015  . Influenza, High Dose Seasonal PF 03/27/2019, 03/26/2020  . Influenza-Unspecified 02/10/2004, 02/03/2013, 03/11/2015, 02/29/2016, 03/10/2016, 03/11/2017, 03/12/2017, 03/21/2018  . MMR 09/16/1973, 06/11/2006  . PFIZER(Purple Top)SARS-COV-2 Vaccination 07/28/2019, 08/18/2019, 03/15/2020, 03/26/2020  . Pneumococcal Polysaccharide-23 02/17/2016, 11/05/2020  . Rubella 12/05/1993, 12/05/1993  . Td 06/01/1990, 05/12/2001, 06/16/2018  . Tdap 06/14/2007  . Tetanus 06/01/2008   I recommend a yearly flu shot in the fall.  Counseled on the pneumococcal vaccine.  Vaccine information sheet given.  Pneumococcal vaccine PPSV23 given after consent  obtained.    Screening for cancer: Colon cancer screening: I reviewed your colonoscopy on file that is up to date from 09/2020  Breast cancer screening: You should perform a self breast exam monthly.   We reviewed recommendations for regular mammograms and breast cancer screening.  Cervical cancer screening: We reviewed recommendations for pap smear screening.   Skin cancer screening: Check your skin regularly for new changes, growing lesions, or other lesions of concern Come in for evaluation if you have skin lesions of concern.  Lung cancer screening: If you have a greater than 20 pack year history of tobacco use, then you may qualify for lung cancer screening with a chest CT scan.   Please call your insurance company to inquire about coverage for this test.  We currently don't have screenings for other cancers besides  breast, cervical, colon, and lung cancers.  If you have a strong family history of cancer or have other cancer screening concerns, please let me know.    Bone health: Get at least 150 minutes of aerobic exercise weekly Get weight bearing exercise at least once weekly Bone density test:   A bone density test is an imaging test that uses a type of X-ray to measure the amount of calcium and other minerals in your bones.  The test may be used to diagnose or screen you for a condition that causes weak or thin bones (osteoporosis), predict your risk for a broken bone (fracture), or determine how well your osteoporosis treatment is working. The bone density test is recommended for females 34 and older, or females or males <33 if certain risk factors such as thyroid disease, long term use of steroids such as for asthma or rheumatological issues, vitamin D deficiency, estrogen deficiency, family history of osteoporosis, self or family history of fragility fracture in first degree relative.    Heart health: Get at least 150 minutes of aerobic exercise weekly Limit  alcohol It is important to maintain a healthy blood pressure and healthy cholesterol numbers  Heart disease screening: Screening for heart disease includes screening for blood pressure, fasting lipids, glucose/diabetes screening, BMI height to weight ratio, reviewed of smoking status, physical activity, and diet.    Goals include blood pressure 120/80 or less, maintaining a healthy lipid/cholesterol profile, preventing diabetes or keeping diabetes numbers under good control, not smoking or using tobacco products, exercising most days per week or at least 150 minutes per week of exercise, and eating healthy variety of fruits and vegetables, healthy oils, and avoiding unhealthy food choices like fried food, fast food, high sugar and high cholesterol foods.    Other tests may possibly include EKG test, CT coronary calcium score, echocardiogram, exercise treadmill stress test.    Medical care options: I recommend you continue to seek care here first for routine care.  We try really hard to have available appointments Monday through Friday daytime hours for sick visits, acute visits, and physicals.  Urgent care should be used for after hours and weekends for significant issues that cannot wait till the next day.  The emergency department should be used for significant potentially life-threatening emergencies.  The emergency department is expensive, can often have long wait times for less significant concerns, so try to utilize primary care, urgent care, or telemedicine when possible to avoid unnecessary trips to the emergency department.  Virtual visits and telemedicine have been introduced since the pandemic started in 2020, and can be convenient ways to receive medical care.  We offer virtual appointments as well to assist you in a variety of options to seek medical care.    Separate significant issues discussed: Obesity-discussed need to continue efforts to lose weight through healthy diet and  exercise.  Fatty liver disease- counseled on need for weight loss and low-fat low-cholesterol diet.  Discussed long-term risk of fatty liver disease  Hyperlipidemia-diet controlled currently.   Labs today  Asthma-no recent problems  Vitamin D deficiency-continue supplement  Impaired glucose - labs today  Skin lesion-advised topical triple antibiotic, bandage, discussed wound care.  If not improving/resolving within the next 10-14 days then recheck.  If worse redness, warmth, fever or other worse changes, then recheck right away   Narmeen was seen today for annual exam.  Diagnoses and all orders for this visit:  Encounter for health maintenance examination in adult -  Lipid panel -     Hemoglobin A1c -     VITAMIN D 25 Hydroxy (Vit-D Deficiency, Fractures) -     CBC with Differential/Platelet -     Comprehensive metabolic panel  S/P hysterectomy  Vaccine counseling  Need for pneumococcal vaccination  Screening for lipid disorders  Screening for diabetes mellitus -     Hemoglobin A1c  Vitamin D deficiency -     VITAMIN D 25 Hydroxy (Vit-D Deficiency, Fractures)  Mild persistent asthma without complication  Allergic rhinitis due to pollen, unspecified seasonality  Fatty liver -     Comprehensive metabolic panel  Gastroesophageal reflux disease, unspecified whether esophagitis present  Impaired fasting blood sugar -     Hemoglobin A1c  OAB (overactive bladder)  DEGENERATIVE DISC DISEASE, LUMBAR SPINE  Hyperlipidemia, unspecified hyperlipidemia type -     Lipid panel  PMDD (premenstrual dysphoric disorder)  Obesity with serious comorbidity, unspecified classification, unspecified obesity type  Insomnia, unspecified type  S/P laparoscopic assisted vaginal hysterectomy (LAVH)  Other orders -     Pneumococcal polysaccharide vaccine 23-valent greater than or equal to 2yo subcutaneous/IM    Follow-up pending labs, yearly for  physical

## 2020-11-06 ENCOUNTER — Other Ambulatory Visit: Payer: Self-pay | Admitting: Medical

## 2020-11-06 DIAGNOSIS — J3081 Allergic rhinitis due to animal (cat) (dog) hair and dander: Secondary | ICD-10-CM | POA: Diagnosis not present

## 2020-11-06 DIAGNOSIS — J3089 Other allergic rhinitis: Secondary | ICD-10-CM | POA: Diagnosis not present

## 2020-11-06 DIAGNOSIS — J301 Allergic rhinitis due to pollen: Secondary | ICD-10-CM | POA: Diagnosis not present

## 2020-11-06 LAB — COMPREHENSIVE METABOLIC PANEL
ALT: 35 IU/L — ABNORMAL HIGH (ref 0–32)
AST: 26 IU/L (ref 0–40)
Albumin/Globulin Ratio: 1.6 (ref 1.2–2.2)
Albumin: 4.5 g/dL (ref 3.8–4.8)
Alkaline Phosphatase: 117 IU/L (ref 44–121)
BUN/Creatinine Ratio: 16 (ref 9–23)
BUN: 13 mg/dL (ref 6–24)
Bilirubin Total: 0.3 mg/dL (ref 0.0–1.2)
CO2: 23 mmol/L (ref 20–29)
Calcium: 9.5 mg/dL (ref 8.7–10.2)
Chloride: 104 mmol/L (ref 96–106)
Creatinine, Ser: 0.8 mg/dL (ref 0.57–1.00)
Globulin, Total: 2.8 g/dL (ref 1.5–4.5)
Glucose: 98 mg/dL (ref 65–99)
Potassium: 4.4 mmol/L (ref 3.5–5.2)
Sodium: 140 mmol/L (ref 134–144)
Total Protein: 7.3 g/dL (ref 6.0–8.5)
eGFR: 91 mL/min/{1.73_m2} (ref >59)

## 2020-11-06 LAB — LIPID PANEL
Chol/HDL Ratio: 3.3 ratio (ref 0.0–4.4)
Cholesterol, Total: 240 mg/dL — ABNORMAL HIGH (ref 100–199)
HDL: 72 mg/dL (ref >39)
LDL Chol Calc (NIH): 141 mg/dL — ABNORMAL HIGH (ref 0–99)
Triglycerides: 155 mg/dL — ABNORMAL HIGH (ref 0–149)
VLDL Cholesterol Cal: 27 mg/dL (ref 5–40)

## 2020-11-06 LAB — VITAMIN D 25 HYDROXY (VIT D DEFICIENCY, FRACTURES): Vit D, 25-Hydroxy: 30.1 ng/mL (ref 30.0–100.0)

## 2020-11-06 LAB — CBC WITH DIFFERENTIAL/PLATELET
Basophils Absolute: 0 10*3/uL (ref 0.0–0.2)
Basos: 1 %
EOS (ABSOLUTE): 0 10*3/uL (ref 0.0–0.4)
Eos: 1 %
Hematocrit: 42 % (ref 34.0–46.6)
Hemoglobin: 13.5 g/dL (ref 11.1–15.9)
Immature Grans (Abs): 0 10*3/uL (ref 0.0–0.1)
Immature Granulocytes: 0 %
Lymphocytes Absolute: 1.3 10*3/uL (ref 0.7–3.1)
Lymphs: 25 %
MCH: 27 pg (ref 26.6–33.0)
MCHC: 32.1 g/dL (ref 31.5–35.7)
MCV: 84 fL (ref 79–97)
Monocytes Absolute: 0.6 10*3/uL (ref 0.1–0.9)
Monocytes: 12 %
Neutrophils Absolute: 3.1 10*3/uL (ref 1.4–7.0)
Neutrophils: 61 %
Platelets: 233 10*3/uL (ref 150–450)
RBC: 5 x10E6/uL (ref 3.77–5.28)
RDW: 14.6 % (ref 11.7–15.4)
WBC: 5.1 10*3/uL (ref 3.4–10.8)

## 2020-11-06 LAB — HEMOGLOBIN A1C
Est. average glucose Bld gHb Est-mCnc: 120 mg/dL
Hgb A1c MFr Bld: 5.8 % — ABNORMAL HIGH (ref 4.8–5.6)

## 2020-11-06 MED ORDER — VITAMIN D2 50 MCG (2000 UT) PO TABS
1.0000 | ORAL_TABLET | Freq: Every day | ORAL | 3 refills | Status: DC
  Filled 2020-11-06: qty 90, fill #0

## 2020-11-06 MED ORDER — ROSUVASTATIN CALCIUM 10 MG PO TABS
10.0000 mg | ORAL_TABLET | Freq: Every day | ORAL | 3 refills | Status: DC
  Filled 2020-11-06: qty 90, 90d supply, fill #0
  Filled 2021-01-20: qty 90, 90d supply, fill #1
  Filled 2021-05-06: qty 90, 90d supply, fill #2
  Filled 2021-08-05: qty 90, 90d supply, fill #3

## 2020-11-06 MED ORDER — TRAZODONE HCL 50 MG PO TABS
25.0000 mg | ORAL_TABLET | Freq: Every evening | ORAL | 5 refills | Status: DC | PRN
  Filled 2020-11-06: qty 30, 30d supply, fill #0
  Filled 2021-11-04: qty 30, 30d supply, fill #1

## 2020-11-06 MED ORDER — RESTORA PO CAPS
1.0000 | ORAL_CAPSULE | Freq: Every day | ORAL | 3 refills | Status: DC
  Filled 2020-11-06: qty 90, 90d supply, fill #0

## 2020-11-07 ENCOUNTER — Other Ambulatory Visit (HOSPITAL_COMMUNITY): Payer: Self-pay

## 2020-11-07 ENCOUNTER — Other Ambulatory Visit: Payer: Self-pay | Admitting: Medical

## 2020-11-07 MED ORDER — PANTOPRAZOLE SODIUM 40 MG PO TBEC
DELAYED_RELEASE_TABLET | Freq: Every day | ORAL | 0 refills | Status: DC
  Filled 2020-11-07 – 2020-12-06 (×2): qty 90, 90d supply, fill #0

## 2020-11-07 MED FILL — Fluticasone Propionate Nasal Susp 50 MCG/ACT: NASAL | 90 days supply | Qty: 48 | Fill #0 | Status: AC

## 2020-11-07 MED FILL — Azelastine HCl Nasal Spray 0.1% (137 MCG/SPRAY): NASAL | 90 days supply | Qty: 90 | Fill #0 | Status: AC

## 2020-11-08 ENCOUNTER — Encounter: Payer: Self-pay | Admitting: Medical

## 2020-11-08 ENCOUNTER — Other Ambulatory Visit (HOSPITAL_COMMUNITY): Payer: Self-pay

## 2020-11-11 ENCOUNTER — Other Ambulatory Visit: Payer: Self-pay | Admitting: Medical

## 2020-11-11 ENCOUNTER — Other Ambulatory Visit: Payer: Self-pay

## 2020-11-11 NOTE — Progress Notes (Signed)
Script was faxed over to pharmacy.

## 2020-11-11 NOTE — Progress Notes (Signed)
NA

## 2020-11-11 NOTE — Progress Notes (Unsigned)
[facility], please call in Tirzepatide /Mounjaro 2.5mg  weekly injection, subcutaneous injection, #30 day supply and 1 refill.  This isn't even in Epic, so call in.     We will await prior auth.   See her my chart message  Michelle Griffin

## 2020-11-12 ENCOUNTER — Other Ambulatory Visit (HOSPITAL_COMMUNITY): Payer: Self-pay

## 2020-11-13 ENCOUNTER — Other Ambulatory Visit (HOSPITAL_COMMUNITY): Payer: Self-pay

## 2020-11-13 MED ORDER — TIRZEPATIDE 2.5 MG/0.5ML ~~LOC~~ SOAJ
2.5000 mg | SUBCUTANEOUS | 1 refills | Status: DC
  Filled 2020-11-13 – 2020-12-03 (×3): qty 2, 28d supply, fill #0
  Filled 2020-12-27: qty 2, 28d supply, fill #1

## 2020-11-14 ENCOUNTER — Other Ambulatory Visit (HOSPITAL_COMMUNITY): Payer: Self-pay

## 2020-11-14 MED FILL — Estradiol TD Patch Twice Weekly 0.1 MG/24HR: TRANSDERMAL | 84 days supply | Qty: 24 | Fill #0 | Status: AC

## 2020-11-15 DIAGNOSIS — J3081 Allergic rhinitis due to animal (cat) (dog) hair and dander: Secondary | ICD-10-CM | POA: Diagnosis not present

## 2020-11-15 DIAGNOSIS — J3089 Other allergic rhinitis: Secondary | ICD-10-CM | POA: Diagnosis not present

## 2020-11-15 DIAGNOSIS — J301 Allergic rhinitis due to pollen: Secondary | ICD-10-CM | POA: Diagnosis not present

## 2020-11-20 ENCOUNTER — Other Ambulatory Visit (HOSPITAL_COMMUNITY): Payer: Self-pay

## 2020-11-20 DIAGNOSIS — Z79890 Hormone replacement therapy (postmenopausal): Secondary | ICD-10-CM | POA: Diagnosis not present

## 2020-11-20 DIAGNOSIS — J301 Allergic rhinitis due to pollen: Secondary | ICD-10-CM | POA: Diagnosis not present

## 2020-11-20 DIAGNOSIS — Z124 Encounter for screening for malignant neoplasm of cervix: Secondary | ICD-10-CM | POA: Diagnosis not present

## 2020-11-20 DIAGNOSIS — J3089 Other allergic rhinitis: Secondary | ICD-10-CM | POA: Diagnosis not present

## 2020-11-20 DIAGNOSIS — Z1151 Encounter for screening for human papillomavirus (HPV): Secondary | ICD-10-CM | POA: Diagnosis not present

## 2020-11-20 DIAGNOSIS — Z1389 Encounter for screening for other disorder: Secondary | ICD-10-CM | POA: Diagnosis not present

## 2020-11-20 DIAGNOSIS — Z130 Encounter for screening for diseases of the blood and blood-forming organs and certain disorders involving the immune mechanism: Secondary | ICD-10-CM | POA: Diagnosis not present

## 2020-11-20 DIAGNOSIS — Z01419 Encounter for gynecological examination (general) (routine) without abnormal findings: Secondary | ICD-10-CM | POA: Diagnosis not present

## 2020-11-20 DIAGNOSIS — Z6833 Body mass index (BMI) 33.0-33.9, adult: Secondary | ICD-10-CM | POA: Diagnosis not present

## 2020-11-20 DIAGNOSIS — J3081 Allergic rhinitis due to animal (cat) (dog) hair and dander: Secondary | ICD-10-CM | POA: Diagnosis not present

## 2020-11-20 MED ORDER — ESTRADIOL 0.1 MG/24HR TD PTTW
MEDICATED_PATCH | TRANSDERMAL | 4 refills | Status: DC
  Filled 2020-11-20 – 2021-01-20 (×2): qty 24, 84d supply, fill #0
  Filled 2021-05-06: qty 24, 84d supply, fill #1
  Filled 2021-07-25: qty 24, 84d supply, fill #2
  Filled 2021-10-07: qty 24, 84d supply, fill #3

## 2020-11-22 ENCOUNTER — Other Ambulatory Visit (HOSPITAL_COMMUNITY): Payer: Self-pay

## 2020-11-23 NOTE — Progress Notes (Unsigned)
[facility] sent in

## 2020-11-26 ENCOUNTER — Encounter: Payer: Self-pay | Admitting: Internal Medicine

## 2020-12-03 ENCOUNTER — Other Ambulatory Visit (HOSPITAL_COMMUNITY): Payer: Self-pay

## 2020-12-03 ENCOUNTER — Telehealth: Payer: Self-pay

## 2020-12-03 NOTE — Telephone Encounter (Signed)
P.A. Darcel Bayley was completed and denied, only covered for diabetes.  Activated and printed discount card and called pharmacy.  Daphne from [employer] was able to get the discount card to work for $25 a month.  Called pt and informed.  They are ordering and will be in tomorrow.

## 2020-12-03 NOTE — Telephone Encounter (Signed)
Daphne from [employer] called back and was able to get the discount card to work with the denied P.A. for $25 a month.  Called pt and informed.  They are ordering and will be in tomorrow.

## 2020-12-04 ENCOUNTER — Other Ambulatory Visit (HOSPITAL_COMMUNITY): Payer: Self-pay

## 2020-12-05 DIAGNOSIS — J3089 Other allergic rhinitis: Secondary | ICD-10-CM | POA: Diagnosis not present

## 2020-12-05 DIAGNOSIS — J301 Allergic rhinitis due to pollen: Secondary | ICD-10-CM | POA: Diagnosis not present

## 2020-12-05 DIAGNOSIS — J3081 Allergic rhinitis due to animal (cat) (dog) hair and dander: Secondary | ICD-10-CM | POA: Diagnosis not present

## 2020-12-06 ENCOUNTER — Other Ambulatory Visit (HOSPITAL_COMMUNITY): Payer: Self-pay

## 2020-12-06 ENCOUNTER — Other Ambulatory Visit: Payer: Self-pay | Admitting: Medical

## 2020-12-06 MED ORDER — SERTRALINE HCL 100 MG PO TABS
ORAL_TABLET | Freq: Every day | ORAL | 0 refills | Status: DC
  Filled 2020-12-06: qty 90, 90d supply, fill #0

## 2020-12-07 ENCOUNTER — Other Ambulatory Visit (HOSPITAL_COMMUNITY): Payer: Self-pay

## 2020-12-11 DIAGNOSIS — J3089 Other allergic rhinitis: Secondary | ICD-10-CM | POA: Diagnosis not present

## 2020-12-11 DIAGNOSIS — J301 Allergic rhinitis due to pollen: Secondary | ICD-10-CM | POA: Diagnosis not present

## 2020-12-11 DIAGNOSIS — J3081 Allergic rhinitis due to animal (cat) (dog) hair and dander: Secondary | ICD-10-CM | POA: Diagnosis not present

## 2020-12-26 DIAGNOSIS — J301 Allergic rhinitis due to pollen: Secondary | ICD-10-CM | POA: Diagnosis not present

## 2020-12-26 DIAGNOSIS — J3081 Allergic rhinitis due to animal (cat) (dog) hair and dander: Secondary | ICD-10-CM | POA: Diagnosis not present

## 2020-12-26 DIAGNOSIS — J3089 Other allergic rhinitis: Secondary | ICD-10-CM | POA: Diagnosis not present

## 2020-12-27 ENCOUNTER — Other Ambulatory Visit (HOSPITAL_COMMUNITY): Payer: Self-pay

## 2021-01-03 DIAGNOSIS — J3081 Allergic rhinitis due to animal (cat) (dog) hair and dander: Secondary | ICD-10-CM | POA: Diagnosis not present

## 2021-01-03 DIAGNOSIS — J3089 Other allergic rhinitis: Secondary | ICD-10-CM | POA: Diagnosis not present

## 2021-01-03 DIAGNOSIS — J301 Allergic rhinitis due to pollen: Secondary | ICD-10-CM | POA: Diagnosis not present

## 2021-01-10 DIAGNOSIS — J301 Allergic rhinitis due to pollen: Secondary | ICD-10-CM | POA: Diagnosis not present

## 2021-01-10 DIAGNOSIS — J3089 Other allergic rhinitis: Secondary | ICD-10-CM | POA: Diagnosis not present

## 2021-01-10 DIAGNOSIS — J3081 Allergic rhinitis due to animal (cat) (dog) hair and dander: Secondary | ICD-10-CM | POA: Diagnosis not present

## 2021-01-15 ENCOUNTER — Other Ambulatory Visit: Payer: Self-pay

## 2021-01-15 ENCOUNTER — Ambulatory Visit: Payer: 59 | Admitting: Medical

## 2021-01-15 VITALS — BP 122/72 | HR 97 | Wt 224.6 lb

## 2021-01-15 DIAGNOSIS — J301 Allergic rhinitis due to pollen: Secondary | ICD-10-CM | POA: Diagnosis not present

## 2021-01-15 DIAGNOSIS — E785 Hyperlipidemia, unspecified: Secondary | ICD-10-CM

## 2021-01-15 DIAGNOSIS — K760 Fatty (change of) liver, not elsewhere classified: Secondary | ICD-10-CM | POA: Diagnosis not present

## 2021-01-15 DIAGNOSIS — E669 Obesity, unspecified: Secondary | ICD-10-CM | POA: Diagnosis not present

## 2021-01-15 DIAGNOSIS — R7301 Impaired fasting glucose: Secondary | ICD-10-CM | POA: Diagnosis not present

## 2021-01-15 DIAGNOSIS — E559 Vitamin D deficiency, unspecified: Secondary | ICD-10-CM

## 2021-01-15 DIAGNOSIS — J3081 Allergic rhinitis due to animal (cat) (dog) hair and dander: Secondary | ICD-10-CM | POA: Diagnosis not present

## 2021-01-15 DIAGNOSIS — J3089 Other allergic rhinitis: Secondary | ICD-10-CM | POA: Diagnosis not present

## 2021-01-15 LAB — COMPREHENSIVE METABOLIC PANEL
ALT: 46 IU/L — ABNORMAL HIGH (ref 0–32)
AST: 37 IU/L (ref 0–40)
Albumin/Globulin Ratio: 1.7 (ref 1.2–2.2)
Albumin: 4.4 g/dL (ref 3.8–4.8)
Alkaline Phosphatase: 147 IU/L — ABNORMAL HIGH (ref 44–121)
BUN/Creatinine Ratio: 11 (ref 9–23)
BUN: 10 mg/dL (ref 6–24)
Bilirubin Total: 0.3 mg/dL (ref 0.0–1.2)
CO2: 25 mmol/L (ref 20–29)
Calcium: 9.8 mg/dL (ref 8.7–10.2)
Chloride: 102 mmol/L (ref 96–106)
Creatinine, Ser: 0.91 mg/dL (ref 0.57–1.00)
Globulin, Total: 2.6 g/dL (ref 1.5–4.5)
Glucose: 87 mg/dL (ref 65–99)
Potassium: 4.8 mmol/L (ref 3.5–5.2)
Sodium: 140 mmol/L (ref 134–144)
Total Protein: 7 g/dL (ref 6.0–8.5)
eGFR: 78 mL/min/{1.73_m2} (ref >59)

## 2021-01-15 LAB — LIPID PANEL
Chol/HDL Ratio: 2 ratio (ref 0.0–4.4)
Cholesterol, Total: 153 mg/dL (ref 100–199)
HDL: 78 mg/dL (ref >39)
LDL Chol Calc (NIH): 63 mg/dL (ref 0–99)
Triglycerides: 59 mg/dL (ref 0–149)
VLDL Cholesterol Cal: 12 mg/dL (ref 5–40)

## 2021-01-15 NOTE — Progress Notes (Signed)
Subjective:  Michelle Griffin is a 50 y.o. female who presents for Chief Complaint  Patient presents with   follow-up    Follow-up- on cholesterol and weight loss     Here for recheck on multiple issues.  Last visit we started statin Crestor due to elevated cholesterol and reducing risk.  She is taking the medicine daily without any side effects.  Here fasting for recheck labs  Last visit we increased her vitamin D to 2000 units daily.  The over-the-counter version was cheaper so she is taking 2 tablets of the 1000 units daily.  No side effects are medically reported  She began Mounjaro injection recently to help with weight management and impaired glucose.  She does get some headaches and nausea and loss of appetite but for now and still wants to continue this.  She has lost 10 pounds on the medication.  She is exercising some but the high temperatures recently has prevented her from doing more.  She knows she could exercise more.  She is eating healthy things and avoiding high sugar and high fat foods\\  No other aggravating or relieving factors.    No other c/o.  Past Medical History:  Diagnosis Date   Adenocarcinoma in situ of cervix    Allergy    sees [name]   Anemia    hx/o iron deficiency due to heavy periods   Asthma    exercise induced   Bursitis    Cancer ([place])    cervical   COVID-19 05/26/2019   fatigue, h/a, loss of taste and smell, fatige x 1 month, loss of taste x 5 months all symptoms rsolved   DDD (degenerative disc disease), lumbar    Fatty liver    GERD (gastroesophageal reflux disease)    [name]   H/O mammogram    Hiatal hernia    small per EGD, [name]   Hyperlipidemia 2012   was on pravastatin prior   Migraine    prior eval with neurologist, Dr. Melton Alar   Overactive bladder    PMDD (premenstrual dysphoric disorder)    Pyelonephritis    age 60yo   Uterine infection 2015   Vitamin D deficiency    Wears glasses    Current Outpatient  Medications on File Prior to Visit  Medication Sig Dispense Refill   Azelastine HCl 137 MCG/SPRAY SOLN INSTILL 1 TO 2 SPRAYS INTO EACH NOSTRIL TWICE DAILY 90 mL 2   EPINEPHrine (EPI-PEN) 0.3 mg/0.3 mL SOAJ injection Inject 0.3 mg into the muscle as needed for anaphylaxis.      Ergocalciferol (VITAMIN D2) 50 MCG (2000 UT) TABS Take 1 tablet by mouth daily. 90 tablet 3   estradiol (VIVELLE-DOT) 0.1 MG/24HR patch APPLY 1 PATCH ONTO SKIN 2 TIMES A WEEK 24 patch 4   fluticasone (FLONASE) 50 MCG/ACT nasal spray PLACE 1 - 2 SPRAYS INTO EACH NOSTRIL ONCE OR TWICE DAILY AS DIRECTED 48 g 2   ibuprofen (ADVIL) 800 MG tablet TAKE 1 TABLET BY MOUTH EVERY 8 HOURS 30 tablet 0   levocetirizine (XYZAL) 5 MG tablet TAKE 1 TABLET BY MOUTH EVERY EVENING 90 tablet 2   montelukast (SINGULAIR) 10 MG tablet TAKE 1 TABLET BY MOUTH EVERY EVENING 90 tablet 2   pantoprazole (PROTONIX) 40 MG tablet TAKE 1 TABLET BY MOUTH DAILY 90 tablet 0   Probiotic Product (RESTORA) CAPS Take 1 capsule by mouth daily. 90 capsule 3   rosuvastatin (CRESTOR) 10 MG tablet Take 1 tablet (10 mg  total) by mouth daily. 90 tablet 3   sertraline (ZOLOFT) 100 MG tablet TAKE 1 TABLET BY MOUTH DAILY. 90 tablet 0   tirzepatide (MOUNJARO) 2.5 MG/0.5ML Pen Inject 2.5 mg into the skin once a week. 2 mL 1   traZODone (DESYREL) 50 MG tablet Take 0.5-1 tablets (25-50 mg total) by mouth at bedtime as needed for sleep. 30 tablet 5   No current facility-administered medications on file prior to visit.     The following portions of the patient's history were reviewed and updated as appropriate: allergies, current medications, past family history, past medical history, past social history, past surgical history and problem list.  ROS Otherwise as in subjective above  Objective: BP 122/72   Pulse 97   Wt 224 lb 9.6 oz (101.9 kg)   LMP 03/16/2015   BMI 32.23 kg/m   General appearance: alert, no distress, well developed, well nourished HEENT:  normocephalic, sclerae anicteric, PERRLA, EOMi Neck: supple, no lymphadenopathy, no thyromegaly, no masses Heart: RRR, normal S1, S2, no murmurs Lungs: CTA bilaterally, no wheezes, rhonchi, or rales Pulses: 2+ radial pulses, 2+ pedal pulses, normal cap refill Ext: no edema Neuro: nonfocal exam    Assessment: Encounter Diagnoses  Name Primary?   Impaired fasting blood sugar Yes   Vitamin D deficiency    Obesity with serious comorbidity, unspecified classification, unspecified obesity type    Fatty liver    Hyperlipidemia, unspecified hyperlipidemia type      Plan: Impaired fasting glucose and obesity-continue Mounjaro injection for now.  We may have to stop this if she continues getting headaches.  She wants to continue this for now though.  Congratulated her on her weight loss.  We spent more time discussing exercise that she needs to be doing more for exercise.  counseled on diet as well  Vitamin D deficiency-she is compliant with 2000 units of vitamin D daily.  Recheck calcium and electrolytes today  Fatty liver -continue efforts to lose weight through healthy diet and exercise  Hyperlipidemia-recheck labs and starting statin.  Continue low cholesterol diet   [name] was seen today for follow-up.  Diagnoses and all orders for this visit:  Impaired fasting blood sugar  Vitamin D deficiency  Obesity with serious comorbidity, unspecified classification, unspecified obesity type -     Comprehensive metabolic panel  Fatty liver  Hyperlipidemia, unspecified hyperlipidemia type -     Lipid panel -     Comprehensive metabolic panel   Follow up: pending labs

## 2021-01-16 ENCOUNTER — Other Ambulatory Visit: Payer: Self-pay | Admitting: Medical

## 2021-01-16 ENCOUNTER — Other Ambulatory Visit (HOSPITAL_COMMUNITY): Payer: Self-pay

## 2021-01-16 MED ORDER — TIRZEPATIDE 2.5 MG/0.5ML ~~LOC~~ SOAJ
2.5000 mg | SUBCUTANEOUS | 1 refills | Status: DC
  Filled 2021-01-16: qty 2, 28d supply, fill #0
  Filled 2021-02-21: qty 2, 28d supply, fill #1
  Filled 2021-03-21: qty 2, 28d supply, fill #2
  Filled 2021-04-18: qty 2, 28d supply, fill #3

## 2021-01-20 ENCOUNTER — Other Ambulatory Visit: Payer: Self-pay | Admitting: Medical

## 2021-01-20 ENCOUNTER — Other Ambulatory Visit (HOSPITAL_COMMUNITY): Payer: Self-pay

## 2021-01-20 MED FILL — Levocetirizine Dihydrochloride Tab 5 MG: ORAL | 90 days supply | Qty: 90 | Fill #1 | Status: AC

## 2021-01-20 MED FILL — Montelukast Sodium Tab 10 MG (Base Equiv): ORAL | 90 days supply | Qty: 90 | Fill #1 | Status: AC

## 2021-01-21 DIAGNOSIS — J3089 Other allergic rhinitis: Secondary | ICD-10-CM | POA: Diagnosis not present

## 2021-01-21 DIAGNOSIS — J301 Allergic rhinitis due to pollen: Secondary | ICD-10-CM | POA: Diagnosis not present

## 2021-01-21 DIAGNOSIS — J3081 Allergic rhinitis due to animal (cat) (dog) hair and dander: Secondary | ICD-10-CM | POA: Diagnosis not present

## 2021-01-29 DIAGNOSIS — J3089 Other allergic rhinitis: Secondary | ICD-10-CM | POA: Diagnosis not present

## 2021-01-29 DIAGNOSIS — J3081 Allergic rhinitis due to animal (cat) (dog) hair and dander: Secondary | ICD-10-CM | POA: Diagnosis not present

## 2021-01-29 DIAGNOSIS — J301 Allergic rhinitis due to pollen: Secondary | ICD-10-CM | POA: Diagnosis not present

## 2021-01-31 ENCOUNTER — Other Ambulatory Visit (HOSPITAL_COMMUNITY): Payer: Self-pay

## 2021-02-11 ENCOUNTER — Other Ambulatory Visit (HOSPITAL_COMMUNITY): Payer: Self-pay

## 2021-02-12 DIAGNOSIS — J3089 Other allergic rhinitis: Secondary | ICD-10-CM | POA: Diagnosis not present

## 2021-02-12 DIAGNOSIS — J301 Allergic rhinitis due to pollen: Secondary | ICD-10-CM | POA: Diagnosis not present

## 2021-02-12 DIAGNOSIS — J3081 Allergic rhinitis due to animal (cat) (dog) hair and dander: Secondary | ICD-10-CM | POA: Diagnosis not present

## 2021-02-21 ENCOUNTER — Other Ambulatory Visit (HOSPITAL_COMMUNITY): Payer: Self-pay

## 2021-02-26 DIAGNOSIS — J3089 Other allergic rhinitis: Secondary | ICD-10-CM | POA: Diagnosis not present

## 2021-02-26 DIAGNOSIS — J3081 Allergic rhinitis due to animal (cat) (dog) hair and dander: Secondary | ICD-10-CM | POA: Diagnosis not present

## 2021-02-26 DIAGNOSIS — J301 Allergic rhinitis due to pollen: Secondary | ICD-10-CM | POA: Diagnosis not present

## 2021-03-05 ENCOUNTER — Other Ambulatory Visit (HOSPITAL_COMMUNITY): Payer: Self-pay

## 2021-03-05 ENCOUNTER — Other Ambulatory Visit: Payer: Self-pay | Admitting: Medical

## 2021-03-05 MED ORDER — SERTRALINE HCL 100 MG PO TABS
ORAL_TABLET | Freq: Every day | ORAL | 0 refills | Status: DC
  Filled 2021-03-05: qty 90, 90d supply, fill #0

## 2021-03-05 MED ORDER — PANTOPRAZOLE SODIUM 40 MG PO TBEC
DELAYED_RELEASE_TABLET | Freq: Every day | ORAL | 0 refills | Status: DC
Start: 2021-03-05 — End: 2021-06-04
  Filled 2021-03-05: qty 90, 90d supply, fill #0

## 2021-03-10 DIAGNOSIS — J3089 Other allergic rhinitis: Secondary | ICD-10-CM | POA: Diagnosis not present

## 2021-03-10 DIAGNOSIS — J301 Allergic rhinitis due to pollen: Secondary | ICD-10-CM | POA: Diagnosis not present

## 2021-03-10 DIAGNOSIS — J3081 Allergic rhinitis due to animal (cat) (dog) hair and dander: Secondary | ICD-10-CM | POA: Diagnosis not present

## 2021-03-19 DIAGNOSIS — J3081 Allergic rhinitis due to animal (cat) (dog) hair and dander: Secondary | ICD-10-CM | POA: Diagnosis not present

## 2021-03-19 DIAGNOSIS — J301 Allergic rhinitis due to pollen: Secondary | ICD-10-CM | POA: Diagnosis not present

## 2021-03-19 DIAGNOSIS — J3089 Other allergic rhinitis: Secondary | ICD-10-CM | POA: Diagnosis not present

## 2021-03-21 ENCOUNTER — Other Ambulatory Visit (HOSPITAL_COMMUNITY): Payer: Self-pay

## 2021-03-27 DIAGNOSIS — J3081 Allergic rhinitis due to animal (cat) (dog) hair and dander: Secondary | ICD-10-CM | POA: Diagnosis not present

## 2021-03-27 DIAGNOSIS — J3089 Other allergic rhinitis: Secondary | ICD-10-CM | POA: Diagnosis not present

## 2021-03-27 DIAGNOSIS — J301 Allergic rhinitis due to pollen: Secondary | ICD-10-CM | POA: Diagnosis not present

## 2021-04-03 DIAGNOSIS — J3081 Allergic rhinitis due to animal (cat) (dog) hair and dander: Secondary | ICD-10-CM | POA: Diagnosis not present

## 2021-04-03 DIAGNOSIS — J301 Allergic rhinitis due to pollen: Secondary | ICD-10-CM | POA: Diagnosis not present

## 2021-04-03 DIAGNOSIS — J3089 Other allergic rhinitis: Secondary | ICD-10-CM | POA: Diagnosis not present

## 2021-04-10 DIAGNOSIS — J3089 Other allergic rhinitis: Secondary | ICD-10-CM | POA: Diagnosis not present

## 2021-04-10 DIAGNOSIS — J301 Allergic rhinitis due to pollen: Secondary | ICD-10-CM | POA: Diagnosis not present

## 2021-04-10 DIAGNOSIS — J3081 Allergic rhinitis due to animal (cat) (dog) hair and dander: Secondary | ICD-10-CM | POA: Diagnosis not present

## 2021-04-14 ENCOUNTER — Other Ambulatory Visit (HOSPITAL_COMMUNITY): Payer: Self-pay

## 2021-04-14 MED ORDER — LEVOCETIRIZINE DIHYDROCHLORIDE 5 MG PO TABS
ORAL_TABLET | ORAL | 6 refills | Status: DC
  Filled 2021-04-14: qty 30, 30d supply, fill #0
  Filled 2021-05-06: qty 30, 30d supply, fill #1
  Filled 2021-06-16: qty 30, 30d supply, fill #2
  Filled 2021-07-13: qty 30, 30d supply, fill #3
  Filled 2021-08-05: qty 30, 30d supply, fill #4
  Filled 2021-09-09: qty 30, 30d supply, fill #5
  Filled 2021-10-02 – 2021-10-03 (×2): qty 30, 30d supply, fill #6

## 2021-04-14 MED ORDER — FLUTICASONE PROPIONATE 50 MCG/ACT NA SUSP
NASAL | 6 refills | Status: DC
  Filled 2021-04-14: qty 16, 30d supply, fill #0
  Filled 2021-06-04: qty 16, 30d supply, fill #1
  Filled 2021-07-13: qty 16, 30d supply, fill #2
  Filled 2021-11-27: qty 16, 30d supply, fill #3

## 2021-04-14 MED ORDER — EPINEPHRINE 0.3 MG/0.3ML IJ SOAJ
INTRAMUSCULAR | 1 refills | Status: DC
  Filled 2021-04-14: qty 2, 30d supply, fill #0

## 2021-04-14 MED ORDER — MONTELUKAST SODIUM 10 MG PO TABS
ORAL_TABLET | ORAL | 6 refills | Status: DC
  Filled 2021-04-14: qty 30, 30d supply, fill #0
  Filled 2021-05-06: qty 30, 30d supply, fill #1
  Filled 2021-06-16: qty 30, 30d supply, fill #2
  Filled 2021-07-13: qty 30, 30d supply, fill #3
  Filled 2021-08-05: qty 30, 30d supply, fill #4
  Filled 2021-09-09: qty 30, 30d supply, fill #5
  Filled 2021-09-16 – 2021-10-03 (×3): qty 30, 30d supply, fill #6

## 2021-04-14 MED ORDER — AZELASTINE HCL 137 MCG/SPRAY NA SOLN
NASAL | 6 refills | Status: DC
  Filled 2021-04-14: qty 30, 30d supply, fill #0
  Filled 2021-06-04: qty 30, 25d supply, fill #1
  Filled 2021-07-26: qty 30, 25d supply, fill #2
  Filled 2021-11-27: qty 30, 25d supply, fill #3

## 2021-04-15 DIAGNOSIS — J301 Allergic rhinitis due to pollen: Secondary | ICD-10-CM | POA: Diagnosis not present

## 2021-04-16 DIAGNOSIS — J3089 Other allergic rhinitis: Secondary | ICD-10-CM | POA: Diagnosis not present

## 2021-04-16 DIAGNOSIS — J3081 Allergic rhinitis due to animal (cat) (dog) hair and dander: Secondary | ICD-10-CM | POA: Diagnosis not present

## 2021-04-18 ENCOUNTER — Other Ambulatory Visit (HOSPITAL_COMMUNITY): Payer: Self-pay

## 2021-04-21 DIAGNOSIS — J3089 Other allergic rhinitis: Secondary | ICD-10-CM | POA: Diagnosis not present

## 2021-04-21 DIAGNOSIS — J3081 Allergic rhinitis due to animal (cat) (dog) hair and dander: Secondary | ICD-10-CM | POA: Diagnosis not present

## 2021-04-21 DIAGNOSIS — J301 Allergic rhinitis due to pollen: Secondary | ICD-10-CM | POA: Diagnosis not present

## 2021-04-29 ENCOUNTER — Ambulatory Visit: Payer: 59 | Admitting: Medical

## 2021-04-30 ENCOUNTER — Ambulatory Visit: Payer: 59 | Admitting: Medical

## 2021-04-30 ENCOUNTER — Other Ambulatory Visit: Payer: Self-pay

## 2021-04-30 ENCOUNTER — Other Ambulatory Visit (HOSPITAL_COMMUNITY): Payer: Self-pay

## 2021-04-30 VITALS — BP 120/80 | HR 102 | Wt 217.0 lb

## 2021-04-30 DIAGNOSIS — E669 Obesity, unspecified: Secondary | ICD-10-CM | POA: Diagnosis not present

## 2021-04-30 DIAGNOSIS — Z79899 Other long term (current) drug therapy: Secondary | ICD-10-CM | POA: Insufficient documentation

## 2021-04-30 DIAGNOSIS — E785 Hyperlipidemia, unspecified: Secondary | ICD-10-CM

## 2021-04-30 DIAGNOSIS — R7301 Impaired fasting glucose: Secondary | ICD-10-CM | POA: Diagnosis not present

## 2021-04-30 DIAGNOSIS — F419 Anxiety disorder, unspecified: Secondary | ICD-10-CM

## 2021-04-30 DIAGNOSIS — R110 Nausea: Secondary | ICD-10-CM

## 2021-04-30 DIAGNOSIS — K760 Fatty (change of) liver, not elsewhere classified: Secondary | ICD-10-CM | POA: Diagnosis not present

## 2021-04-30 LAB — HEMOGLOBIN A1C
Est. average glucose Bld gHb Est-mCnc: 111 mg/dL
Hgb A1c MFr Bld: 5.5 % (ref 4.8–5.6)

## 2021-04-30 MED ORDER — NALTREXONE HCL 50 MG PO TABS
25.0000 mg | ORAL_TABLET | Freq: Every day | ORAL | 2 refills | Status: DC
  Filled 2021-04-30: qty 30, 60d supply, fill #0
  Filled 2021-11-04: qty 30, 60d supply, fill #1

## 2021-04-30 MED ORDER — ONDANSETRON HCL 4 MG PO TABS
4.0000 mg | ORAL_TABLET | Freq: Three times a day (TID) | ORAL | 1 refills | Status: DC | PRN
  Filled 2021-04-30: qty 20, 7d supply, fill #0
  Filled 2021-11-04: qty 20, 7d supply, fill #1

## 2021-04-30 MED ORDER — TIRZEPATIDE 5 MG/0.5ML ~~LOC~~ SOAJ
5.0000 mg | SUBCUTANEOUS | 1 refills | Status: DC
  Filled 2021-04-30: qty 2, 28d supply, fill #0
  Filled 2021-06-12: qty 2, 28d supply, fill #1
  Filled 2021-07-13: qty 2, 28d supply, fill #2
  Filled 2021-08-07: qty 2, 28d supply, fill #3

## 2021-04-30 NOTE — Progress Notes (Signed)
Subjective:  Michelle Griffin is a 50 y.o. female who presents for Chief Complaint  Patient presents with   follow-up     Here for follow-up on medication.  She continues on [facility] for impaired glucose and obesity.  She continues to lose weight and is happy with the response she has had so far to this medication.  She was exercising pretty good but lately this has been a little bit more lax given the holidays and busy work schedule.  She also had a stomach virus over Thanksgiving.  She is eating healthy.  she started the weight loss efforts at 234 pounds.  She would like to get down to 180 pounds  She is compliant with statin for high cholesterol.  With the holidays coming up she would like to go back on naltrexone short-term.  She has used this in the past given some prior issues with alcohol dependency.  Everything seems she picked up a stomach virus.  This is mostly back to normal but she does still have some nausea.  She like a refill on some nausea medication  No other aggravating or relieving factors.    No other c/o.  Past Medical History:  Diagnosis Date   Adenocarcinoma in situ of cervix    Allergy    sees [name]   Anemia    hx/o iron deficiency due to heavy periods   Asthma    exercise induced   Bursitis    Cancer ([place])    cervical   COVID-19 05/26/2019   fatigue, h/a, loss of taste and smell, fatige x 1 month, loss of taste x 5 months all symptoms rsolved   DDD (degenerative disc disease), lumbar    Fatty liver    GERD (gastroesophageal reflux disease)    [name]   H/O mammogram    Hiatal hernia    small per EGD, [name]   Hyperlipidemia 2012   was on pravastatin prior   Migraine    prior eval with neurologist, Dr. Melton Alar   Overactive bladder    PMDD (premenstrual dysphoric disorder)    Pyelonephritis    age 64yo   Uterine infection 2015   Vitamin D deficiency    Wears glasses    Current Outpatient Medications on File Prior to Visit  Medication Sig  Dispense Refill   Azelastine HCl 137 MCG/SPRAY SOLN INSTILL 1 TO 2 SPRAYS INTO EACH NOTRIL TWICE DAILY 30 mL 6   Ergocalciferol (VITAMIN D2) 50 MCG (2000 UT) TABS Take 1 tablet by mouth daily. 90 tablet 3   estradiol (VIVELLE-DOT) 0.1 MG/24HR patch APPLY 1 PATCH ONTO SKIN 2 TIMES A WEEK 24 patch 4   fluticasone (FLONASE) 50 MCG/ACT nasal spray Use 1 - 2 sprays in each nostril once or twice daily as directed. 16 g 6   levocetirizine (XYZAL) 5 MG tablet Take 1 tablet by mouth each evening 30 tablet 6   montelukast (SINGULAIR) 10 MG tablet TAKE 1 TABLET BY MOUTH EVERY EVENING 30 tablet 6   pantoprazole (PROTONIX) 40 MG tablet TAKE 1 TABLET BY MOUTH DAILY 90 tablet 0   Probiotic Product (RESTORA) CAPS Take 1 capsule by mouth daily. 90 capsule 3   rosuvastatin (CRESTOR) 10 MG tablet Take 1 tablet (10 mg total) by mouth daily. 90 tablet 3   sertraline (ZOLOFT) 100 MG tablet TAKE 1 TABLET BY MOUTH DAILY. 90 tablet 0   traZODone (DESYREL) 50 MG tablet Take 0.5-1 tablets (25-50 mg total) by mouth at bedtime  as needed for sleep. 30 tablet 5   EPINEPHrine 0.3 mg/0.3 mL IJ SOAJ injection Inject as directed into the muscle as needed for severe allergic reaction 1 each 1   No current facility-administered medications on file prior to visit.    Past Surgical History:  Procedure Laterality Date   CERVICAL CONIZATION W/BX N/A 03/05/2020   Procedure: CONIZATION CERVIX WITH BIOPSY;  Surgeon: Paula Compton, MD;  Location: [place];  Service: Gynecology;  Laterality: N/A;   CHOLECYSTECTOMY  2011   [facility]   COLONOSCOPY  2014   repeat 5 years; [name], baseline   CYSTOSCOPY N/A 03/20/2020   Procedure: CYSTOSCOPY;  Surgeon: Paula Compton, MD;  [facility];  Service: Gynecology;  Laterality: N/A;   ESOPHAGOGASTRODUODENOSCOPY  2014   [name]   FOOT SURGERY Left 2011   morton's neuroma   LAPAROSCOPIC VAGINAL HYSTERECTOMY WITH SALPINGECTOMY Bilateral 03/20/2020    Procedure: LAPAROSCOPIC ASSISTED VAGINAL HYSTERECTOMY WITH SALPINGECTOMY;  Surgeon: Paula Compton, MD;  Location: [place];  Service: Gynecology;  Laterality: Bilateral;   LASIK  yrs ago   [name]   L[facility]BAR DISC SURG[facility]  07[facility]2013[facility]   due to herniated disc, Dr. Janan Ridge ABLATION  2015   WISDOM TOOTH EXTRACTION  age 82       The following portions of the patient's history were reviewed and updated as appropriate: allergies, current medications, past family history, past medical history, past social history, past surgical history and problem list.  ROS Otherwise as in subjective above    Objective: BP 120/80   Pulse (!) 102   Wt 217 lb (98.4 kg)   LMP 03/16/2015   BMI 31.14 kg/m   General appearance: alert, no distress, well developed, well nourished Psych: Pleasant, good eye contact, answers questions appropriately Pulses: 2+ radial pulses, 2+ pedal pulses, normal cap refill Ext: no edema     Assessment: Encounter Diagnoses  Name Primary?   Obesity with serious comorbidity, unspecified classification, unspecified obesity type Yes   Fatty liver    Hyperlipidemia, unspecified hyperlipidemia type    Impaired fasting blood sugar    High risk medication use    Anxiety       Plan: It is good to see that she continues losing weight with her efforts.  Congratulated her on her efforts.  She will continue Mounjaro, but we will increase the dose after discussing risk and benefits.  She wants to get down to 180 pounds.  Counseled on the need to do better with exercise and activity.  Continue healthy eating habits.  Update hemoglobin A1c screen today.  Impaired glucose-continue efforts to lifestyle and weight loss efforts.  Update hemoglobin A1c today  Refilled Zofran today.  She had a recent gastroenteritis and has had a little bit of nausea although much improved.  She has had some prior issues with alcohol  dependency and anxiety particularly around the holiday.  this can be more of a challenge.  She wants to start back on naltrexone short-term.  She has been on this before.  Fatty liver disease-continue efforts to lose weight through healthy diet and exercise  Hyperlipidemia- continues statin    Zunairah was seen today for follow-up.  Diagnoses and all orders for this visit:  Obesity with serious comorbidity, unspecified classification, unspecified obesity type -     Hemoglobin A1c  Fatty liver -     Hemoglobin A1c  Hyperlipidemia, unspecified hyperlipidemia type  Impaired fasting blood sugar -     Hemoglobin A1c  High risk medication use  Anxiety  Other orders -     tirzepatide (MOUNJARO) 5 MG/0.5ML Pen; Inject 5 mg into the skin once a week. -     ondansetron (ZOFRAN) 4 MG tablet; Take 1 tablet (4 mg total) by mouth every 8 (eight) hours as needed for nausea or vomiting. -     naltrexone (DEPADE) 50 MG tablet; Take 1/2 tablet by mouth daily.    Follow up: pending labs

## 2021-05-06 ENCOUNTER — Other Ambulatory Visit (HOSPITAL_COMMUNITY): Payer: Self-pay

## 2021-05-08 ENCOUNTER — Other Ambulatory Visit (HOSPITAL_COMMUNITY): Payer: Self-pay

## 2021-05-14 DIAGNOSIS — J3081 Allergic rhinitis due to animal (cat) (dog) hair and dander: Secondary | ICD-10-CM | POA: Diagnosis not present

## 2021-05-14 DIAGNOSIS — J3089 Other allergic rhinitis: Secondary | ICD-10-CM | POA: Diagnosis not present

## 2021-05-14 DIAGNOSIS — J301 Allergic rhinitis due to pollen: Secondary | ICD-10-CM | POA: Diagnosis not present

## 2021-05-28 DIAGNOSIS — J301 Allergic rhinitis due to pollen: Secondary | ICD-10-CM | POA: Diagnosis not present

## 2021-05-28 DIAGNOSIS — J3089 Other allergic rhinitis: Secondary | ICD-10-CM | POA: Diagnosis not present

## 2021-05-28 DIAGNOSIS — J3081 Allergic rhinitis due to animal (cat) (dog) hair and dander: Secondary | ICD-10-CM | POA: Diagnosis not present

## 2021-06-03 ENCOUNTER — Other Ambulatory Visit: Payer: Self-pay | Admitting: Medical

## 2021-06-03 DIAGNOSIS — Z1231 Encounter for screening mammogram for malignant neoplasm of breast: Secondary | ICD-10-CM

## 2021-06-04 ENCOUNTER — Other Ambulatory Visit: Payer: Self-pay | Admitting: Medical

## 2021-06-04 ENCOUNTER — Other Ambulatory Visit (HOSPITAL_COMMUNITY): Payer: Self-pay

## 2021-06-04 MED ORDER — TIZANIDINE HCL 4 MG PO TABS: 4.0000 mg | ORAL_TABLET | Freq: Four times a day (QID) | ORAL | 0 refills | Status: DC | PRN

## 2021-06-04 MED ORDER — PREDNISONE 10 MG PO TABS: ORAL_TABLET | ORAL | 0 refills | Status: DC

## 2021-06-04 MED ORDER — PANTOPRAZOLE SODIUM 40 MG PO TBEC
DELAYED_RELEASE_TABLET | Freq: Every day | ORAL | 0 refills | Status: DC
  Filled 2021-06-04: qty 90, 90d supply, fill #0

## 2021-06-04 MED ORDER — SERTRALINE HCL 100 MG PO TABS
ORAL_TABLET | Freq: Every day | ORAL | 0 refills | Status: DC
  Filled 2021-06-04: qty 90, 90d supply, fill #0

## 2021-06-05 ENCOUNTER — Other Ambulatory Visit (HOSPITAL_COMMUNITY): Payer: Self-pay

## 2021-06-05 DIAGNOSIS — J301 Allergic rhinitis due to pollen: Secondary | ICD-10-CM | POA: Diagnosis not present

## 2021-06-05 DIAGNOSIS — J3089 Other allergic rhinitis: Secondary | ICD-10-CM | POA: Diagnosis not present

## 2021-06-05 DIAGNOSIS — J3081 Allergic rhinitis due to animal (cat) (dog) hair and dander: Secondary | ICD-10-CM | POA: Diagnosis not present

## 2021-06-09 DIAGNOSIS — J3089 Other allergic rhinitis: Secondary | ICD-10-CM | POA: Diagnosis not present

## 2021-06-09 DIAGNOSIS — J3081 Allergic rhinitis due to animal (cat) (dog) hair and dander: Secondary | ICD-10-CM | POA: Diagnosis not present

## 2021-06-09 DIAGNOSIS — J301 Allergic rhinitis due to pollen: Secondary | ICD-10-CM | POA: Diagnosis not present

## 2021-06-12 ENCOUNTER — Other Ambulatory Visit (HOSPITAL_COMMUNITY): Payer: Self-pay

## 2021-06-17 ENCOUNTER — Other Ambulatory Visit (HOSPITAL_COMMUNITY): Payer: Self-pay

## 2021-06-20 DIAGNOSIS — J3089 Other allergic rhinitis: Secondary | ICD-10-CM | POA: Diagnosis not present

## 2021-06-20 DIAGNOSIS — J3081 Allergic rhinitis due to animal (cat) (dog) hair and dander: Secondary | ICD-10-CM | POA: Diagnosis not present

## 2021-06-20 DIAGNOSIS — J301 Allergic rhinitis due to pollen: Secondary | ICD-10-CM | POA: Diagnosis not present

## 2021-06-25 DIAGNOSIS — J3089 Other allergic rhinitis: Secondary | ICD-10-CM | POA: Diagnosis not present

## 2021-06-25 DIAGNOSIS — J3081 Allergic rhinitis due to animal (cat) (dog) hair and dander: Secondary | ICD-10-CM | POA: Diagnosis not present

## 2021-06-25 DIAGNOSIS — J301 Allergic rhinitis due to pollen: Secondary | ICD-10-CM | POA: Diagnosis not present

## 2021-07-08 DIAGNOSIS — J3081 Allergic rhinitis due to animal (cat) (dog) hair and dander: Secondary | ICD-10-CM | POA: Diagnosis not present

## 2021-07-08 DIAGNOSIS — J3089 Other allergic rhinitis: Secondary | ICD-10-CM | POA: Diagnosis not present

## 2021-07-08 DIAGNOSIS — J301 Allergic rhinitis due to pollen: Secondary | ICD-10-CM | POA: Diagnosis not present

## 2021-07-14 ENCOUNTER — Other Ambulatory Visit (HOSPITAL_COMMUNITY): Payer: Self-pay

## 2021-07-21 DIAGNOSIS — J3089 Other allergic rhinitis: Secondary | ICD-10-CM | POA: Diagnosis not present

## 2021-07-21 DIAGNOSIS — J301 Allergic rhinitis due to pollen: Secondary | ICD-10-CM | POA: Diagnosis not present

## 2021-07-21 DIAGNOSIS — J3081 Allergic rhinitis due to animal (cat) (dog) hair and dander: Secondary | ICD-10-CM | POA: Diagnosis not present

## 2021-07-25 ENCOUNTER — Other Ambulatory Visit (HOSPITAL_COMMUNITY): Payer: Self-pay

## 2021-07-26 ENCOUNTER — Other Ambulatory Visit (HOSPITAL_COMMUNITY): Payer: Self-pay

## 2021-07-29 ENCOUNTER — Ambulatory Visit
Admission: RE | Admit: 2021-07-29 | Discharge: 2021-07-29 | Disposition: A | Discharge status: Home / Self Care | Payer: 59 | Source: Ambulatory Visit

## 2021-07-29 DIAGNOSIS — Z1231 Encounter for screening mammogram for malignant neoplasm of breast: Secondary | ICD-10-CM | POA: Diagnosis not present

## 2021-07-30 ENCOUNTER — Other Ambulatory Visit: Payer: Self-pay | Admitting: Medical

## 2021-07-30 ENCOUNTER — Other Ambulatory Visit (HOSPITAL_COMMUNITY): Payer: Self-pay | Admitting: Medical

## 2021-07-30 DIAGNOSIS — R928 Other abnormal and inconclusive findings on diagnostic imaging of breast: Secondary | ICD-10-CM

## 2021-07-31 DIAGNOSIS — J3081 Allergic rhinitis due to animal (cat) (dog) hair and dander: Secondary | ICD-10-CM | POA: Diagnosis not present

## 2021-07-31 DIAGNOSIS — J301 Allergic rhinitis due to pollen: Secondary | ICD-10-CM | POA: Diagnosis not present

## 2021-07-31 DIAGNOSIS — J3089 Other allergic rhinitis: Secondary | ICD-10-CM | POA: Diagnosis not present

## 2021-08-05 ENCOUNTER — Ambulatory Visit (HOSPITAL_COMMUNITY)
Admission: RE | Admit: 2021-08-05 | Discharge: 2021-08-05 | Disposition: A | Discharge status: Home / Self Care | Payer: 59 | Source: Ambulatory Visit | Attending: Medical | Admitting: Medical

## 2021-08-05 ENCOUNTER — Other Ambulatory Visit: Payer: Self-pay

## 2021-08-05 DIAGNOSIS — R928 Other abnormal and inconclusive findings on diagnostic imaging of breast: Secondary | ICD-10-CM

## 2021-08-05 DIAGNOSIS — R922 Inconclusive mammogram: Secondary | ICD-10-CM | POA: Diagnosis not present

## 2021-08-06 ENCOUNTER — Other Ambulatory Visit (HOSPITAL_COMMUNITY): Payer: Self-pay

## 2021-08-07 ENCOUNTER — Other Ambulatory Visit (HOSPITAL_COMMUNITY): Payer: Self-pay

## 2021-08-08 ENCOUNTER — Other Ambulatory Visit (HOSPITAL_COMMUNITY): Payer: Self-pay

## 2021-08-08 ENCOUNTER — Other Ambulatory Visit: Payer: Self-pay | Admitting: Medical

## 2021-08-08 DIAGNOSIS — J3081 Allergic rhinitis due to animal (cat) (dog) hair and dander: Secondary | ICD-10-CM | POA: Diagnosis not present

## 2021-08-08 DIAGNOSIS — J301 Allergic rhinitis due to pollen: Secondary | ICD-10-CM | POA: Diagnosis not present

## 2021-08-08 DIAGNOSIS — J3089 Other allergic rhinitis: Secondary | ICD-10-CM | POA: Diagnosis not present

## 2021-08-08 MED ORDER — WEGOVY 0.5 MG/0.5ML ~~LOC~~ SOAJ
0.5000 mg | SUBCUTANEOUS | 0 refills | Status: DC
  Filled 2021-08-08: qty 2, 28d supply, fill #0

## 2021-08-08 MED ORDER — TIRZEPATIDE 5 MG/0.5ML ~~LOC~~ SOAJ
5.0000 mg | SUBCUTANEOUS | 0 refills | Status: DC
Start: 2021-08-08 — End: 2022-01-20
  Filled 2021-08-08 – 2021-11-27 (×2): qty 6, 84d supply, fill #0

## 2021-08-09 ENCOUNTER — Telehealth: Payer: Self-pay

## 2021-08-09 NOTE — Telephone Encounter (Signed)
P.A. WEGOVY 

## 2021-08-14 ENCOUNTER — Other Ambulatory Visit (HOSPITAL_COMMUNITY): Payer: Self-pay

## 2021-08-14 NOTE — Telephone Encounter (Signed)
P.A. approved til 11/10/21, pt informed & advised of discount cards online, faxed pharmacy.

## 2021-08-18 ENCOUNTER — Other Ambulatory Visit: Payer: 59

## 2021-08-19 DIAGNOSIS — J301 Allergic rhinitis due to pollen: Secondary | ICD-10-CM | POA: Diagnosis not present

## 2021-08-19 DIAGNOSIS — J3089 Other allergic rhinitis: Secondary | ICD-10-CM | POA: Diagnosis not present

## 2021-08-19 DIAGNOSIS — J3081 Allergic rhinitis due to animal (cat) (dog) hair and dander: Secondary | ICD-10-CM | POA: Diagnosis not present

## 2021-08-26 DIAGNOSIS — J3081 Allergic rhinitis due to animal (cat) (dog) hair and dander: Secondary | ICD-10-CM | POA: Diagnosis not present

## 2021-08-26 DIAGNOSIS — J301 Allergic rhinitis due to pollen: Secondary | ICD-10-CM | POA: Diagnosis not present

## 2021-08-26 DIAGNOSIS — J3089 Other allergic rhinitis: Secondary | ICD-10-CM | POA: Diagnosis not present

## 2021-08-28 ENCOUNTER — Ambulatory Visit: Payer: 59 | Admitting: Medical

## 2021-08-28 VITALS — BP 120/70 | HR 98 | Wt 207.6 lb

## 2021-08-28 DIAGNOSIS — K760 Fatty (change of) liver, not elsewhere classified: Secondary | ICD-10-CM

## 2021-08-28 DIAGNOSIS — R7301 Impaired fasting glucose: Secondary | ICD-10-CM | POA: Diagnosis not present

## 2021-08-28 DIAGNOSIS — E669 Obesity, unspecified: Secondary | ICD-10-CM

## 2021-08-28 DIAGNOSIS — E785 Hyperlipidemia, unspecified: Secondary | ICD-10-CM

## 2021-08-28 NOTE — Progress Notes (Signed)
Subjective: ? Michelle Griffin is a 51 y.o. female who presents for ?Chief Complaint  ?Patient presents with  ? weight loss follow-up  ?   ?Here for weight loss follow-up.  She is on Mounjaro.  She started out at 234lb back in June 2022.  She has continued to have successful weight loss on this medication.  She has maybe 2 or 3 more weeks left on Mounjaro.  The plan is to switch to [facility] due to insurance changes.  She already has the [facility] 0.5 mg dose but has not started this yet. ? ?Current exercise - 10,000 steps daily goal.  Walking a lot.   No weight bearing exercise ? ?Goal weight 180 lb.   ? ?No other aggravating or relieving factors.   ? ?No other c/o. ? ?Past Medical History:  ?Diagnosis Date  ? Adenocarcinoma in situ of cervix   ? Allergy   ? sees [name]  ? Anemia   ? hx/o iron deficiency due to heavy periods  ? Asthma   ? exercise induced  ? Bursitis   ? Cancer [facility])   ? cervical  ? COVID-19 05/26/2019  ? fatigue, h/a, loss of taste and smell, fatige x 1 month, loss of taste x 5 months all symptoms rsolved  ? DDD (degenerative disc disease), lumbar   ? Fatty liver   ? GERD (gastroesophageal reflux disease)   ? [name]  ? H/O mammogram   ? Hiatal hernia   ? small per EGD, [name]  ? Hyperlipidemia 2012  ? was on pravastatin prior  ? Migraine   ? prior eval with neurologist, Dr. Melton Alar  ? Overactive bladder   ? PMDD (premenstrual dysphoric disorder)   ? Pyelonephritis   ? age 51yo  ? Uterine infection 2015  ? Vitamin D deficiency   ? Wears glasses   ? ?Current Outpatient Medications on File Prior to Visit  ?Medication Sig Dispense Refill  ? Azelastine HCl 137 MCG/SPRAY SOLN INSTILL 1 TO 2 SPRAYS INTO EACH NOTRIL TWICE DAILY 30 mL 6  ? EPINEPHrine 0.3 mg/0.3 mL IJ SOAJ injection Inject as directed into the muscle as needed for severe allergic reaction 1 each 1  ? Ergocalciferol (VITAMIN D2) 50 MCG (2000 UT) TABS Take 1 tablet by mouth daily. 90 tablet 3  ? estradiol (VIVELLE-DOT) 0.1 MG/24HR patch APPLY  1 PATCH ONTO SKIN 2 TIMES A WEEK 24 patch 4  ? fluticasone (FLONASE) 50 MCG/ACT nasal spray Use 1 - 2 sprays in each nostril once or twice daily as directed. 16 g 6  ? levocetirizine (XYZAL) 5 MG tablet Take 1 tablet by mouth each evening 30 tablet 6  ? montelukast (SINGULAIR) 10 MG tablet TAKE 1 TABLET BY MOUTH EVERY EVENING 30 tablet 6  ? naltrexone (DEPADE) 50 MG tablet Take 1/2 tablet by mouth daily. 30 tablet 2  ? ondansetron (ZOFRAN) 4 MG tablet Take 1 tablet (4 mg total) by mouth every 8 (eight) hours as needed for nausea or vomiting. 20 tablet 1  ? pantoprazole (PROTONIX) 40 MG tablet TAKE 1 TABLET BY MOUTH DAILY 90 tablet 0  ? Probiotic Product (RESTORA) CAPS Take 1 capsule by mouth daily. 90 capsule 3  ? rosuvastatin (CRESTOR) 10 MG tablet Take 1 tablet (10 mg total) by mouth daily. 90 tablet 3  ? sertraline (ZOLOFT) 100 MG tablet TAKE 1 TABLET BY MOUTH DAILY. 90 tablet 0  ? tirzepatide (MOUNJARO) 5 MG/0.5ML Pen Inject 5 mg into the skin once  a week. 6 mL 0  ? tiZANidine (ZANAFLEX) 4 MG tablet Take 1 tablet (4 mg total) by mouth every 6 (six) hours as needed for muscle spasms. 12 tablet 0  ? traZODone (DESYREL) 50 MG tablet Take 0.5-1 tablets (25-50 mg total) by mouth at bedtime as needed for sleep. 30 tablet 5  ? Semaglutide-Weight Management (WEGOVY) 0.5 MG/0.5ML SOAJ Inject 0.5 mg into the skin once a week. (Patient not taking: Reported on 08/28/2021) 2 mL 0  ? ?No current facility-administered medications on file prior to visit.  ? ? ? ?The following portions of the patient's history were reviewed and updated as appropriate: allergies, current medications, past family history, past medical history, past social history, past surgical history and problem list. ? ?ROS ?Otherwise as in subjective above ? ?Objective: ?BP 120/70   Pulse 98   Wt 207 lb 9.6 oz (94.2 kg)   LMP 03/16/2015   BMI 29.79 kg/m?  ? ?Wt Readings from Last 3 Encounters:  ?08/28/21 207 lb 9.6 oz (94.2 kg)  ?04/30/21 217 lb (98.4 kg)   ?01/15/21 224 lb 9.6 oz (101.9 kg)  ? ? ?General appearance: alert, no distress, well developed, well nourished ?Neck: supple, no lymphadenopathy, no thyromegaly, no masses ?Heart: RRR, normal S1, S2, no murmurs ?Lungs: CTA bilaterally, no wheezes, rhonchi, or rales ?Pulses: 2+ radial pulses, 2+ pedal pulses, normal cap refill ?Ext: no edema ? ? ?Assessment: ?Encounter Diagnoses  ?Name Primary?  ? Obesity with serious comorbidity, unspecified classification, unspecified obesity type Yes  ? Impaired fasting blood sugar   ? Fatty liver   ? Hyperlipidemia, unspecified hyperlipidemia type   ? ? ? ?Plan: ?We discussed her success so far.  Congratulated on her efforts.  Continue and finish out Mounjaro then switch to [facility].  Add weightbearing exercise.  We discussed some strategies for weightbearing exercise.  Continue aerobic exercise.  Continue with healthy diet.  Plan to follow-up in about 8 weeks ? ?Michelle Griffin was seen today for weight loss follow-up. ? ?Diagnoses and all orders for this visit: ? ?Obesity with serious comorbidity, unspecified classification, unspecified obesity type ? ?Impaired fasting blood sugar ? ?Fatty liver ? ?Hyperlipidemia, unspecified hyperlipidemia type ? ? ? ?Follow up: 8 weeks ?

## 2021-09-02 ENCOUNTER — Other Ambulatory Visit: Payer: Self-pay | Admitting: Medical

## 2021-09-03 ENCOUNTER — Other Ambulatory Visit (HOSPITAL_COMMUNITY): Payer: Self-pay

## 2021-09-03 MED ORDER — PANTOPRAZOLE SODIUM 40 MG PO TBEC
DELAYED_RELEASE_TABLET | Freq: Every day | ORAL | 1 refills | Status: DC
  Filled 2021-09-03: qty 90, 90d supply, fill #0
  Filled 2021-11-27: qty 90, 90d supply, fill #1

## 2021-09-03 MED ORDER — SERTRALINE HCL 100 MG PO TABS
ORAL_TABLET | Freq: Every day | ORAL | 1 refills | Status: DC
  Filled 2021-09-03: qty 90, 90d supply, fill #0
  Filled 2021-11-27: qty 90, 90d supply, fill #1

## 2021-09-09 ENCOUNTER — Other Ambulatory Visit (HOSPITAL_COMMUNITY): Payer: Self-pay

## 2021-09-12 DIAGNOSIS — H5201 Hypermetropia, right eye: Secondary | ICD-10-CM | POA: Diagnosis not present

## 2021-09-12 DIAGNOSIS — J3081 Allergic rhinitis due to animal (cat) (dog) hair and dander: Secondary | ICD-10-CM | POA: Diagnosis not present

## 2021-09-12 DIAGNOSIS — J301 Allergic rhinitis due to pollen: Secondary | ICD-10-CM | POA: Diagnosis not present

## 2021-09-12 DIAGNOSIS — J3089 Other allergic rhinitis: Secondary | ICD-10-CM | POA: Diagnosis not present

## 2021-09-12 NOTE — Progress Notes (Signed)
?[facility] Note ? ?09/16/2021 ? ?  ? ?CHIEF COMPLAINT ?Patient presents for Retina Evaluation ? ? ?HISTORY OF PRESENT ILLNESS: ?Michelle Griffin is a 51 y.o. female who presents to the clinic today for:  ? ?HPI   ? ? Retina Evaluation   ?In left eye.  This started 4 days ago.  Duration of 4 days.  I, the attending physician,  performed the HPI with the patient and updated documentation appropriately. ? ?  ?  ? ? Comments   ?Pt here for ret eval referred by Dr. 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 Pt states she was in to see Dr. Valetta Close Friday for her annual exam. Has not experienced any vision changes or issues recently. Pt reports being pre diabetic, she is on Wegovy injection weekly, reports she is no longer pre diabetic. Diabetes does run in her family. She has been on the injection since last Summer, starting with [facility]. Most recent A1C taken in Nov was 5.8. Pts sister has diabetic retinopathy and receives injections OU, patients father had an optic nerve stroke and is partially blind. Wears rx specs, current pair is 8 months old.  ? ?  ?  ?Last edited by Bernarda Caffey, MD on 09/16/2021  8:57 AM.  ?  ?Pt is here on the referral of Dr. Valetta Close for concern of retinal hemes OS, pt states she saw him last Friday for a routine eye exam, she is not experiencing any vision problems, pt states she was pre-diabetic and put on Mounjaro and is no longer diabetic, she is currently taking Wegovy for weight loss, she does not take any medication for BP, but takes several allergy medications and a statin for cholesterol, pts sister gets injections for diabetic retinopathy and her dad had an optic nerve stroke and is blind in one eye ? ?Referring physician: ?Jola Schmidt, MD ?[address] ?[place],  [place] [id number] ? ?HISTORICAL INFORMATION:  ? ?Selected notes from the [facility] ?Referred by Dr. Valetta Close for retinal hemorrhages OS ?LEE:  ?Ocular Hx- ?PMH- ?  ? ?CURRENT MEDICATIONS: ?No  current outpatient medications on file. (Ophthalmic Drugs)  ? ?No current facility-administered medications for this visit. (Ophthalmic Drugs)  ? ?Current Outpatient Medications (Other)  ?Medication Sig  ? Azelastine HCl 137 MCG/SPRAY SOLN INSTILL 1 TO 2 SPRAYS INTO EACH NOTRIL TWICE DAILY  ? EPINEPHrine 0.3 mg/0.3 mL IJ SOAJ injection Inject as directed into the muscle as needed for severe allergic reaction  ? Ergocalciferol (VITAMIN D2) 50 MCG (2000 UT) TABS Take 1 tablet by mouth daily.  ? estradiol (VIVELLE-DOT) 0.1 MG/24HR patch APPLY 1 PATCH ONTO SKIN 2 TIMES A WEEK  ? fluticasone (FLONASE) 50 MCG/ACT nasal spray Use 1 - 2 sprays in each nostril once or twice daily as directed.  ? levocetirizine (XYZAL) 5 MG tablet Take 1 tablet by mouth each evening  ? montelukast (SINGULAIR) 10 MG tablet TAKE 1 TABLET BY MOUTH EVERY EVENING  ? ondansetron (ZOFRAN) 4 MG tablet Take 1 tablet (4 mg total) by mouth every 8 (eight) hours as needed for nausea or vomiting.  ? pantoprazole (PROTONIX) 40 MG tablet TAKE 1 TABLET BY MOUTH DAILY  ? Probiotic Product (RESTORA) CAPS Take 1 capsule by mouth daily.  ? rosuvastatin (CRESTOR) 10 MG tablet Take 1 tablet (10 mg total) by mouth daily.  ? Semaglutide-Weight Management (WEGOVY) 0.5 MG/0.5ML SOAJ Inject 0.5 mg into the skin once a week.  ? sertraline (ZOLOFT)  100 MG tablet TAKE 1 TABLET BY MOUTH DAILY.  ? traZODone (DESYREL) 50 MG tablet Take 0.5-1 tablets (25-50 mg total) by mouth at bedtime as needed for sleep.  ? Blood Pressure Monitoring (OMRON 3 SERIES BP MONITOR) DEVI Use as directed  ? naltrexone (DEPADE) 50 MG tablet Take 1/2 tablet by mouth daily. (Patient not taking: Reported on 09/16/2021)  ? tirzepatide [facility]) 5 MG/0.5ML Pen Inject 5 mg into the skin once a week. (Patient not taking: Reported on 09/16/2021)  ? tiZANidine (ZANAFLEX) 4 MG tablet Take 1 tablet (4 mg total) by mouth every 6 (six) hours as needed for muscle spasms. (Patient not taking: Reported on  09/16/2021)  ? ?No current facility-administered medications for this visit. (Other)  ? ?REVIEW OF SYSTEMS: ?ROS   ?Positive for: Cardiovascular, Eyes ?Negative for: Constitutional, Gastrointestinal, Neurological, Skin, Genitourinary, Musculoskeletal, HENT, Endocrine, Respiratory, Psychiatric, Allergic/Imm, Heme/Lymph ?Last edited by Kingsley Spittle, COT on 09/16/2021  8:22 AM.  ?  ? ?ALLERGIES ?Allergies  ?Allergen Reactions  ? Sulfa Antibiotics Hives  ? Ambien [Zolpidem]   ?  Nightmares/vivid dreams  ? Percocet [Oxycodone-Acetaminophen] Itching  ?  Oxycodone " makes me climb the wall, itching"  ? ?PAST MEDICAL HISTORY ?Past Medical History:  ?Diagnosis Date  ? Adenocarcinoma in situ of cervix   ? Allergy   ? sees [name]  ? Anemia   ? hx/o iron deficiency due to heavy periods  ? Asthma   ? exercise induced  ? Bursitis   ? Cancer [facility])   ? cervical  ? COVID-19 05/26/2019  ? fatigue, h/a, loss of taste and smell, fatige x 1 month, loss of taste x 5 months all symptoms rsolved  ? DDD (degenerative disc disease), lumbar   ? Fatty liver   ? GERD (gastroesophageal reflux disease)   ? [name]  ? H/O mammogram   ? Hiatal hernia   ? small per EGD, [name]  ? Hyperlipidemia 2012  ? was on pravastatin prior  ? Migraine   ? prior eval with neurologist, Dr. Melton Alar  ? Overactive bladder   ? PMDD (premenstrual dysphoric disorder)   ? Pyelonephritis   ? age 21yo  ? Uterine infection 2015  ? Vitamin D deficiency   ? Wears glasses   ? ?Past Surgical History:  ?Procedure Laterality Date  ? CERVICAL CONIZATION W/BX N/A 03/05/2020  ? Procedure: CONIZATION CERVIX WITH BIOPSY;  Surgeon: Paula Compton, MD;  Location: [facility];  Service: Gynecology;  Laterality: N/A;  ? CHOLECYSTECTOMY  2011  ? lapa  ? COLONOSCOPY  2014  ? repeat 5 years; [name], baseline  ? CYSTOSCOPY N/A 03/20/2020  ? Procedure: CYSTOSCOPY;  Surgeon: Paula Compton, MD;  Location: [facility];  Service: Gynecology;   Laterality: N/A;  ? ESOPHAGOGASTRODUODENOSCOPY  2014  ? [name]  ? FOOT SURGERY Left 2011  ? morton's neuroma  ? LAPAROSCOPIC VAGINAL HYSTERECTOMY WITH SALPINGECTOMY Bilateral 03/20/2020  ? Procedure: LAPAROSCOPIC ASSISTED VAGINAL HYSTERECTOMY WITH SALPINGECTOMY;  Surgeon: Paula Compton, MD;  Location: [facility];  Service: Gynecology;  Laterality: Bilateral;  ? LASIK  yrs ago  ? [name]  ? LUMBAR DISC SURGERY  [facility]2013, [facility]  ? due to herniated disc, [name]  ? [place]  2015  ? WISDOM TOOTH EXTRACTION  age 66  ? ?FAMILY HISTORY ?Family History  ?Problem Relation Age of Onset  ? Cataracts Mother   ? Lupus Mother   ? Hypertension  Mother   ? Hyperlipidemia Mother   ? Migraines Mother   ? Kidney disease Mother   ? Hypertension Father   ? Diabetes Father   ? Hyperlipidemia Father   ? Depression Father   ? Anxiety disorder Father   ? Dementia Father   ? Obesity Sister   ? Hypertension Sister   ? Diabetes Sister   ? Heart disease Paternal Uncle   ?     CABG  ? Hyperlipidemia Paternal Uncle   ? Cancer Paternal Uncle   ?     multiple with colon cancer  ? Ovarian cysts Daughter   ? Cancer Cousin   ?     breast  ? Cancer Cousin   ?     ovarian  ? Stroke Neg Hx   ? ?SOCIAL HISTORY ?Social History  ? ?Tobacco Use  ? Smoking status: Never  ? Smokeless tobacco: Never  ?Vaping Use  ? Vaping Use: Never used  ?Substance Use Topics  ? Alcohol use: Yes  ?  Alcohol/week: 3.0 standard drinks  ?  Types: 3 Cans of beer per week  ?  Comment: socially  ? Drug use: No  ?  ? ?  ?OPHTHALMIC EXAM: ? ?Base Eye Exam   ? ? Visual Acuity (Snellen - Linear)   ? ?   Right Left  ? Dist cc 20/20 20/20  ? ? Correction: Glasses  ? ?  ?  ? ? Tonometry (Tonopen, 8:36 AM)   ? ?   Right Left  ? Pressure 13 12  ? ?  ?  ? ? Pupils   ? ?   Dark Light Shape React APD  ? Right 3 2 Round Brisk None  ? Left 3 2 Round Brisk None  ? ?  ?  ? ? Visual Fields (Counting fingers)   ? ?   Left Right  ?  Full  Full  ? ?  ?  ? ? Extraocular Movement   ? ?   Right Left  ?  Full, Ortho Full, Ortho  ? ?  ?  ? ? Neuro/Psych   ? ? Oriented x3: Yes  ? Mood/Affect: Normal  ? ?  ?  ? ? Dilation   ? ? Both eyes: 1.0% Mydriacyl, 2

## 2021-09-16 ENCOUNTER — Encounter (INDEPENDENT_AMBULATORY_CARE_PROVIDER_SITE_OTHER): Payer: Self-pay | Admitting: Ophthalmology

## 2021-09-16 ENCOUNTER — Other Ambulatory Visit (HOSPITAL_COMMUNITY): Payer: Self-pay

## 2021-09-16 ENCOUNTER — Ambulatory Visit (INDEPENDENT_AMBULATORY_CARE_PROVIDER_SITE_OTHER): Payer: 59 | Admitting: Ophthalmology

## 2021-09-16 DIAGNOSIS — Z9889 Other specified postprocedural states: Secondary | ICD-10-CM

## 2021-09-16 DIAGNOSIS — I10 Essential (primary) hypertension: Secondary | ICD-10-CM

## 2021-09-16 DIAGNOSIS — H3562 Retinal hemorrhage, left eye: Secondary | ICD-10-CM

## 2021-09-16 DIAGNOSIS — H3581 Retinal edema: Secondary | ICD-10-CM

## 2021-09-16 DIAGNOSIS — H35033 Hypertensive retinopathy, bilateral: Secondary | ICD-10-CM

## 2021-09-16 MED ORDER — OMRON 3 SERIES BP MONITOR DEVI
0 refills | Status: AC
  Filled 2021-09-16: qty 1, 90d supply, fill #0

## 2021-09-17 ENCOUNTER — Other Ambulatory Visit (HOSPITAL_COMMUNITY): Payer: Self-pay

## 2021-09-17 DIAGNOSIS — J3089 Other allergic rhinitis: Secondary | ICD-10-CM | POA: Diagnosis not present

## 2021-09-17 DIAGNOSIS — J301 Allergic rhinitis due to pollen: Secondary | ICD-10-CM | POA: Diagnosis not present

## 2021-09-17 DIAGNOSIS — J3081 Allergic rhinitis due to animal (cat) (dog) hair and dander: Secondary | ICD-10-CM | POA: Diagnosis not present

## 2021-10-02 ENCOUNTER — Other Ambulatory Visit (HOSPITAL_COMMUNITY): Payer: Self-pay

## 2021-10-02 ENCOUNTER — Other Ambulatory Visit: Payer: Self-pay | Admitting: Medical

## 2021-10-02 MED ORDER — WEGOVY 0.5 MG/0.5ML ~~LOC~~ SOAJ
0.5000 mg | SUBCUTANEOUS | 0 refills | Status: DC
  Filled 2021-10-02: qty 2, 28d supply, fill #0

## 2021-10-03 ENCOUNTER — Other Ambulatory Visit (HOSPITAL_COMMUNITY): Payer: Self-pay

## 2021-10-06 DIAGNOSIS — J301 Allergic rhinitis due to pollen: Secondary | ICD-10-CM | POA: Diagnosis not present

## 2021-10-06 DIAGNOSIS — J3089 Other allergic rhinitis: Secondary | ICD-10-CM | POA: Diagnosis not present

## 2021-10-06 DIAGNOSIS — J3081 Allergic rhinitis due to animal (cat) (dog) hair and dander: Secondary | ICD-10-CM | POA: Diagnosis not present

## 2021-10-07 ENCOUNTER — Other Ambulatory Visit (HOSPITAL_COMMUNITY): Payer: Self-pay

## 2021-10-10 DIAGNOSIS — D1809 Hemangioma of other sites: Secondary | ICD-10-CM | POA: Diagnosis not present

## 2021-10-10 DIAGNOSIS — D485 Neoplasm of uncertain behavior of skin: Secondary | ICD-10-CM | POA: Diagnosis not present

## 2021-10-10 DIAGNOSIS — J301 Allergic rhinitis due to pollen: Secondary | ICD-10-CM | POA: Diagnosis not present

## 2021-10-10 DIAGNOSIS — D3612 Benign neoplasm of peripheral nerves and autonomic nervous system, upper limb, including shoulder: Secondary | ICD-10-CM | POA: Diagnosis not present

## 2021-10-10 DIAGNOSIS — J3081 Allergic rhinitis due to animal (cat) (dog) hair and dander: Secondary | ICD-10-CM | POA: Diagnosis not present

## 2021-10-10 DIAGNOSIS — J3089 Other allergic rhinitis: Secondary | ICD-10-CM | POA: Diagnosis not present

## 2021-10-10 DIAGNOSIS — L820 Inflamed seborrheic keratosis: Secondary | ICD-10-CM | POA: Diagnosis not present

## 2021-10-20 DIAGNOSIS — J3089 Other allergic rhinitis: Secondary | ICD-10-CM | POA: Diagnosis not present

## 2021-10-20 DIAGNOSIS — J301 Allergic rhinitis due to pollen: Secondary | ICD-10-CM | POA: Diagnosis not present

## 2021-10-21 NOTE — Progress Notes (Shared)
[facility] Note  10/30/2021     CHIEF COMPLAINT Patient presents for No chief complaint on file.   HISTORY OF PRESENT ILLNESS: Michelle Griffin is a 51 y.o. female who presents to the clinic today for:    Pt is here on the referral of Dr. Valetta Close for concern of retinal hemes OS, pt states she saw him last Friday for a routine eye exam, she is not experiencing any vision problems, pt states she was pre-diabetic and put on Mounjaro and is no longer diabetic, she is currently taking Wegovy for weight loss, she does not take any medication for BP, but takes several allergy medications and a statin for cholesterol, pts sister gets injections for diabetic retinopathy and her dad had an optic nerve stroke and is blind in one eye  Referring physician: Carlena Hurl, PA-C [place],  [place] [address]  HISTORICAL INFORMATION:   Selected notes from the MEDICAL RECORD NUMBER Referred by Dr. Valetta Close for retinal hemorrhages OS LEE:  Ocular Hx- PMH-    CURRENT MEDICATIONS: No current outpatient medications on file. (Ophthalmic Drugs)   No current facility-administered medications for this visit. (Ophthalmic Drugs)   Current Outpatient Medications (Other)  Medication Sig   Azelastine HCl 137 MCG/SPRAY SOLN INSTILL 1 TO 2 SPRAYS INTO EACH NOTRIL TWICE DAILY   Blood Pressure Monitoring (OMRON 3 SERIES BP MONITOR) DEVI Use as directed   EPINEPHrine 0.3 mg/0.3 mL IJ SOAJ injection Inject as directed into the muscle as needed for severe allergic reaction   Ergocalciferol (VITAMIN D2) 50 MCG (2000 UT) TABS Take 1 tablet by mouth daily.   estradiol (VIVELLE-DOT) 0.1 MG/24HR patch APPLY 1 PATCH ONTO SKIN 2 TIMES A WEEK   fluticasone (FLONASE) 50 MCG/ACT nasal spray Use 1 - 2 sprays in each nostril once or twice daily as directed.   levocetirizine (XYZAL) 5 MG tablet Take 1 tablet by mouth each evening   montelukast (SINGULAIR) 10 MG tablet TAKE 1 TABLET BY  MOUTH EVERY EVENING   naltrexone (DEPADE) 50 MG tablet Take 1/2 tablet by mouth daily. (Patient not taking: Reported on 09/16/2021)   ondansetron (ZOFRAN) 4 MG tablet Take 1 tablet (4 mg total) by mouth every 8 (eight) hours as needed for nausea or vomiting.   pantoprazole (PROTONIX) 40 MG tablet TAKE 1 TABLET BY MOUTH DAILY   Probiotic Product (RESTORA) CAPS Take 1 capsule by mouth daily.   rosuvastatin (CRESTOR) 10 MG tablet Take 1 tablet (10 mg total) by mouth daily.   Semaglutide-Weight Management (WEGOVY) 0.5 MG/0.5ML SOAJ Inject 0.5 mg into the skin once a week.   sertraline (ZOLOFT) 100 MG tablet TAKE 1 TABLET BY MOUTH DAILY.   tirzepatide [facility]) 5 MG/0.5ML Pen Inject 5 mg into the skin once a week. (Patient not taking: Reported on 09/16/2021)   tiZANidine (ZANAFLEX) 4 MG tablet Take 1 tablet (4 mg total) by mouth every 6 (six) hours as needed for muscle spasms. (Patient not taking: Reported on 09/16/2021)   traZODone (DESYREL) 50 MG tablet Take 0.5-1 tablets (25-50 mg total) by mouth at bedtime as needed for sleep.   No current facility-administered medications for this visit. (Other)   REVIEW OF SYSTEMS:   ALLERGIES Allergies  Allergen Reactions   Sulfa Antibiotics Hives   Ambien [Zolpidem]     Nightmares/vivid dreams   Percocet [Oxycodone-Acetaminophen] Itching    Oxycodone " makes me climb the wall, itching"   PAST MEDICAL HISTORY Past Medical History:  Diagnosis Date   Adenocarcinoma in situ of cervix    Allergy    sees [name]   Anemia    hx/o iron deficiency due to heavy periods   Asthma    exercise induced   Bursitis    Cancer (HCC)    cervical   COVID-19 05/26/2019   fatigue, h/a, loss of taste and smell, fatige x 1 month, loss of taste x 5 months all symptoms rsolved   DDD (degenerative disc disease), lumbar    Fatty liver    GERD (gastroesophageal reflux disease)    [name]   H/O mammogram    Hiatal hernia    small per EGD, [name]    Hyperlipidemia 2012   was on pravastatin prior   Migraine    prior eval with neurologist, Dr. Melton Alar   Overactive bladder    PMDD (premenstrual dysphoric disorder)    Pyelonephritis    age 40yo   Uterine infection 2015   Vitamin D deficiency    Wears glasses    Past Surgical History:  Procedure Laterality Date   CERVICAL CONIZATION W/BX N/A 03/05/2020   Procedure: CONIZATION CERVIX WITH BIOPSY;  Surgeon: Paula Compton, MD;  Location: [place];  Service: Gynecology;  Laterality: N/A;   CHOLECYSTECTOMY  2011   [place]   COLONOSCOPY  2014   repeat 5 years; [name], baseline   CYSTOSCOPY N/A 03/20/2020   Procedure: CYSTOSCOPY;  Surgeon: Paula Compton, MD;  Location: [facility];  Service: Gynecology;  Laterality: N/A;   ESOPHAGOGASTRODUODENOSCOPY  2014   [name]   FOOT SURGERY Left 2011   morton's neuroma   LAPAROSCOPIC VAGINAL HYSTERECTOMY WITH SALPINGECTOMY Bilateral 03/20/2020   Procedure: LAPAROSCOPIC ASSISTED VAGINAL HYSTERECTOMY WITH SALPINGECTOMY;  Surgeon: Paula Compton, MD;  Location: [place];  Service: Gynecology;  Laterality: Bilateral;   LASIK  yrs ago   [name]   LUMBAR DISC SURGERY  07/2013, [facility]   due to herniated disc, Dr. Janan Ridge ABLATION  2015   WISDOM TOOTH EXTRACTION  age 12   FAMILY HISTORY Family History  Problem Relation Age of Onset   Cataracts Mother    Lupus Mother    Hypertension Mother    Hyperlipidemia Mother    Migraines Mother    Kidney disease Mother    Hypertension Father    Diabetes Father    Hyperlipidemia Father    Depression Father    Anxiety disorder Father    Dementia Father    Obesity Sister    Hypertension Sister    Diabetes Sister    Heart disease Paternal Uncle        CABG   Hyperlipidemia Paternal Uncle    Cancer Paternal Uncle        multiple with colon cancer   Ovarian cysts Daughter    Cancer Cousin        breast    Cancer Cousin        ovarian   Stroke Neg Hx    SOCIAL HISTORY Social History   Tobacco Use   Smoking status: Never   Smokeless tobacco: Never  Vaping Use   Vaping Use: Never used  Substance Use Topics   Alcohol use: Yes    Alcohol/week: 3.0 standard drinks    Types: 3 Cans of beer per week    Comment: socially   Drug use: No       OPHTHALMIC EXAM:  Not recorded  IMAGING AND PROCEDURES  Imaging and Procedures for 10/30/2021          ASSESSMENT/PLAN:    ICD-10-CM   1. Retinal hemorrhage of left eye  H35.62     2. Hypertensive retinopathy of both eyes  H35.033     3. Hx of LASIK  Z98.890      1. Retinal hemorrhages OS  - mild MA DBH superior periphery -- incidental finding on dilated exam last week (4.14.23) w/ Dr. Valetta Close  - BCVA 20/20 OU and pt asymptomatic  - exam shows attenuated and tortuous venules just outside ST arcades amidst the retinal hemorrhages -- appearance suggestive of mild BRVO  - pt denies history of HTN, DM2, blood clots  - is on [facility] for weight loss and estradiol patch for hormone replacement post hysterectomy (hx of cervical ca)  - OCT w/o retinal edema  - FA 4.18.23 without leakage or vasculitis  - no retinal or ophthalmic interventions indicated or recommended  - monitor  - f/u in 6 wks -- DFE/OCT  2. Hypertensive retinopathy OU  - pt does not carry formal diagnosis of HTN, and is not on any BP meds - discussed importance of tight BP control - recommend home monitoring of BP w/ home cuff - monitor  3. History of LASIK OU  - stable  Ophthalmic Meds Ordered this visit:  No orders of the defined types were placed in this encounter.    No follow-ups on file.  There are no Patient Instructions on file for this visit.   Explained the diagnoses, plan, and follow up with the patient and they expressed understanding.  Patient expressed understanding of the importance of proper follow up care.   This document serves as a  record of services personally performed by Gardiner Sleeper, MD, PhD. It was created on their behalf by San Jetty. Owens Shark, OA an ophthalmic technician. The creation of this record is the provider's dictation and/or activities during the visit.    Electronically signed by: San Jetty. Owens Shark, [place] 05.23.2023 7:58 AM   Gardiner Sleeper, M.D., Ph.D. Diseases & Surgery of the Retina and Vitreous [facility]: M myopia (nearsighted); A astigmatism; H hyperopia (farsighted); P presbyopia; Mrx spectacle prescription;  CTL contact lenses; OD right eye; OS left eye; OU both eyes  XT exotropia; ET esotropia; PEK punctate epithelial keratitis; PEE punctate epithelial erosions; DES dry eye syndrome; MGD meibomian gland dysfunction; ATs artificial tears; PFAT's preservative free artificial tears; [place] nuclear sclerotic cataract; PSC posterior subcapsular cataract; ERM epi-retinal membrane; PVD posterior vitreous detachment; RD retinal detachment; DM diabetes mellitus; DR diabetic retinopathy; NPDR non-proliferative diabetic retinopathy; PDR proliferative diabetic retinopathy; CSME clinically significant macular edema; DME diabetic macular edema; dbh dot blot hemorrhages; CWS cotton wool spot; POAG primary open angle glaucoma; C/D cup-to-disc ratio; HVF humphrey visual field; GVF goldmann visual field; OCT optical coherence tomography; IOP intraocular pressure; BRVO Branch retinal vein occlusion; CRVO central retinal vein occlusion; CRAO central retinal artery occlusion; BRAO branch retinal artery occlusion; RT retinal tear; SB scleral buckle; PPV pars plana vitrectomy; VH Vitreous hemorrhage; PRP panretinal laser photocoagulation; IVK intravitreal kenalog; VMT vitreomacular traction; MH Macular hole;  NVD neovascularization of the disc; NVE neovascularization elsewhere; AREDS age related eye disease study; ARMD age related macular degeneration; POAG primary open angle glaucoma; EBMD  epithelial/anterior basement membrane dystrophy; ACIOL anterior chamber intraocular lens; IOL intraocular lens; PCIOL posterior chamber intraocular lens; Phaco/IOL phacoemulsification with intraocular lens placement;  [place] photorefractive keratectomy; LASIK laser assisted in situ keratomileusis; HTN hypertension; DM diabetes mellitus; COPD chronic obstructive pulmonary disease

## 2021-10-30 ENCOUNTER — Encounter (INDEPENDENT_AMBULATORY_CARE_PROVIDER_SITE_OTHER): Payer: 59 | Admitting: Ophthalmology

## 2021-10-30 DIAGNOSIS — H3562 Retinal hemorrhage, left eye: Secondary | ICD-10-CM

## 2021-10-30 DIAGNOSIS — Z9889 Other specified postprocedural states: Secondary | ICD-10-CM

## 2021-10-30 DIAGNOSIS — H35033 Hypertensive retinopathy, bilateral: Secondary | ICD-10-CM

## 2021-10-30 NOTE — Progress Notes (Signed)
[facility] Note  11/05/2021     CHIEF COMPLAINT Patient presents for Retina Follow Up   HISTORY OF PRESENT ILLNESS: Michelle Griffin is a 51 y.o. female who presents to the clinic today for:   HPI     Retina Follow Up   Patient presents with  Other.  In left eye.  This started months ago.  Duration of 7 weeks.  Since onset it is gradually worsening.  I, the attending physician,  performed the HPI with the patient and updated documentation appropriately.        Comments   Patient feels that the vision is worse since her last appointment 7 weeks ago. It's just harder to see when working.       Last edited by Bernarda Caffey, MD on 11/08/2021  1:28 AM.    Pt feels like vision is getting worse, she is having a hard time reading numbers for work, pt states she woke up at 430 this morning with a headache behind her eye  Referring physician: Carlena Hurl, PA-C [place],  [place] [address]  HISTORICAL INFORMATION:   Selected notes from the MEDICAL RECORD NUMBER Referred by Dr. Valetta Close for retinal hemorrhages OS LEE:  Ocular Hx- PMH-    CURRENT MEDICATIONS: No current outpatient medications on file. (Ophthalmic Drugs)   No current facility-administered medications for this visit. (Ophthalmic Drugs)   Current Outpatient Medications (Other)  Medication Sig   Azelastine HCl 137 MCG/SPRAY SOLN INSTILL 1 TO 2 SPRAYS INTO EACH NOTRIL TWICE DAILY   Blood Pressure Monitoring (OMRON 3 SERIES BP MONITOR) DEVI Use as directed   EPINEPHrine 0.3 mg/0.3 mL IJ SOAJ injection Inject as directed into the muscle as needed for severe allergic reaction   Ergocalciferol (VITAMIN D2) 50 MCG (2000 UT) TABS Take 1 tablet by mouth daily.   estradiol (VIVELLE-DOT) 0.1 MG/24HR patch APPLY 1 PATCH ONTO SKIN 2 TIMES A WEEK   fluticasone (FLONASE) 50 MCG/ACT nasal spray Use 1 - 2 sprays in each nostril once or twice daily as directed.   levocetirizine (XYZAL) 5  MG tablet Take 1 tablet by mouth each evening   montelukast (SINGULAIR) 10 MG tablet TAKE 1 TABLET BY MOUTH EVERY EVENING   naltrexone (DEPADE) 50 MG tablet Take 1/2 tablet by mouth daily.   ondansetron (ZOFRAN) 4 MG tablet Take 1 tablet (4 mg total) by mouth every 8 (eight) hours as needed for nausea or vomiting.   pantoprazole (PROTONIX) 40 MG tablet TAKE 1 TABLET BY MOUTH DAILY   Probiotic Product (RESTORA) CAPS Take 1 capsule by mouth daily.   Semaglutide-Weight Management (WEGOVY) 1 MG/0.5ML SOAJ Inject 1 pen (1 mg ) into the skin once a week.   sertraline (ZOLOFT) 100 MG tablet TAKE 1 TABLET BY MOUTH DAILY.   tirzepatide [facility]) 5 MG/0.5ML Pen Inject 5 mg into the skin once a week.   tiZANidine (ZANAFLEX) 4 MG tablet Take 1 tablet (4 mg total) by mouth every 6 (six) hours as needed for muscle spasms.   traZODone (DESYREL) 50 MG tablet Take 0.5-1 tablets (25-50 mg total) by mouth at bedtime as needed for sleep.   rosuvastatin (CRESTOR) 10 MG tablet Take 1 tablet (10 mg total) by mouth daily.   No current facility-administered medications for this visit. (Other)   REVIEW OF SYSTEMS: ROS   Positive for: Cardiovascular, Eyes Negative for: Constitutional, Gastrointestinal, Neurological, Skin, Genitourinary, Musculoskeletal, HENT, Endocrine, Respiratory, Psychiatric, Allergic/Imm, Heme/Lymph Last edited by  Annie Paras, COT on 11/05/2021  9:35 AM.     ALLERGIES Allergies  Allergen Reactions   Sulfa Antibiotics Hives   Ambien [Zolpidem]     Nightmares/vivid dreams   Percocet [Oxycodone-Acetaminophen] Itching    Oxycodone " makes me climb the wall, itching"   PAST MEDICAL HISTORY Past Medical History:  Diagnosis Date   Adenocarcinoma in situ of cervix    Allergy    sees [name]   Anemia    hx/o iron deficiency due to heavy periods   Asthma    exercise induced   Bursitis    Cancer ([place])    cervical   COVID-19 05/26/2019   fatigue, h/a, loss of taste and smell,  fatige x 1 month, loss of taste x 5 months all symptoms rsolved   DDD (degenerative disc disease), lumbar    Fatty liver    GERD (gastroesophageal reflux disease)    [name]   H/O mammogram    Hiatal hernia    small per EGD, [name]   Hyperlipidemia 2012   was on pravastatin prior   Migraine    prior eval with neurologist, Dr. Melton Alar   Overactive bladder    PMDD (premenstrual dysphoric disorder)    Pyelonephritis    age 77yo   Uterine infection 2015   Vitamin D deficiency    Wears glasses    Past Surgical History:  Procedure Laterality Date   CERVICAL CONIZATION W/BX N/A 03/05/2020   Procedure: CONIZATION CERVIX WITH BIOPSY;  Surgeon: Paula Compton, MD;  Location: [place];  Service: Gynecology;  Laterality: N/A;   CHOLECYSTECTOMY  2011   [place]   COLONOSCOPY  2014   repeat 5 years; [name], baseline   CYSTOSCOPY N/A 03/20/2020   Procedure: CYSTOSCOPY;  Surgeon: Paula Compton, MD;  Location: [facility];  Service: Gynecology;  Laterality: N/A;   ESOPHAGOGASTRODUODENOSCOPY  2014   [name]   FOOT SURGERY Left 2011   morton's neuroma   LAPAROSCOPIC VAGINAL HYSTERECTOMY WITH SALPINGECTOMY Bilateral 03/20/2020   Procedure: LAPAROSCOPIC ASSISTED VAGINAL HYSTERECTOMY WITH SALPINGECTOMY;  Surgeon: Paula Compton, MD;  Location: [place];  Service: Gynecology;  Laterality: Bilateral;   LASIK  yrs ago   [name]   LUMBAR DISC SURGERY  07/2013, [facility]   due to herniated disc, Dr. Janan Ridge ABLATION  2015   WISDOM TOOTH EXTRACTION  age 67   FAMILY HISTORY Family History  Problem Relation Age of Onset   Cataracts Mother    Lupus Mother    Hypertension Mother    Hyperlipidemia Mother    Migraines Mother    Kidney disease Mother    Hypertension Father    Diabetes Father    Hyperlipidemia Father    Depression Father    Anxiety disorder Father    Dementia Father    Obesity  Sister    Hypertension Sister    Diabetes Sister    Heart disease Paternal Uncle        CABG   Hyperlipidemia Paternal Uncle    Cancer Paternal Uncle        multiple with colon cancer   Ovarian cysts Daughter    Cancer Cousin        breast   Cancer Cousin        ovarian   Stroke Neg Hx    SOCIAL HISTORY Social History   Tobacco Use   Smoking status: Never   Smokeless  tobacco: Never  Vaping Use   Vaping Use: Never used  Substance Use Topics   Alcohol use: Yes    Alcohol/week: 3.0 standard drinks of alcohol    Types: 3 Cans of beer per week    Comment: socially   Drug use: No       OPHTHALMIC EXAM:  Base Eye Exam     Visual Acuity (Snellen - Linear)       Right Left   Dist cc 20/20 20/20    Correction: Glasses         Tonometry (Tonopen, 9:38 AM)       Right Left   Pressure 17 15         Pupils       Pupils Dark Light Shape React APD   Right PERRL 3 2 Round Brisk None   Left PERRL 3 2 Round Brisk None         Visual Fields       Left Right    Full Full         Extraocular Movement       Right Left    Full Full         Neuro/Psych     Oriented x3: Yes   Mood/Affect: Normal         Dilation     Both eyes: 1.0% Mydriacyl, 2.5% Phenylephrine @ 9:36 AM           Slit Lamp and Fundus Exam     Slit Lamp Exam       Right Left   Lids/Lashes Normal Normal   Conjunctiva/Sclera White and quiet White and quiet   Cornea Clear Clear   Anterior Chamber Deep and quiet Deep and quiet   Iris Round and dilated Round and dilated   Lens Clear Clear   Anterior Vitreous mild syneresis mild syneresis         Fundus Exam       Right Left   Disc Pink and Sharp Pink and Sharp   C/D Ratio 0.3 0.3   Macula Flat, Good foveal reflex, No heme or edema Flat, Good foveal reflex, RPE mottling, No heme or edema   Vessels mild tortuosity, mild copper wiring mild attenuation, mild tortuosity, mild copper wiring; attenuated and tortuous  venules just outside ST arcades, mild AV crossing changes   Periphery Attached, No heme Attached, attenuated and tortuous venules just outside ST arcades with surrounding MA/DBH, scattered DBH superiorly           Refraction     Wearing Rx       Sphere Cylinder Axis   Right +0.50 +0.75 154   Left +0.50 +0.25 066           IMAGING AND PROCEDURES  Imaging and Procedures for 11/05/2021  OCT, Retina - OU - Both Eyes       Right Eye Quality was good. Central Foveal Thickness: 272. Progression has been stable. Findings include normal foveal contour, no IRF, no SRF, vitreomacular adhesion .   Left Eye Quality was good. Central Foveal Thickness: 274. Progression has been stable. Findings include normal foveal contour, no IRF, no SRF.   Notes *Images captured and stored on drive  Diagnosis / Impression:  NFP, no IRF/SRF OU  Clinical management:  See below  Abbreviations: NFP - Normal foveal profile. CME - cystoid macular edema. PED - pigment epithelial detachment. IRF - intraretinal fluid. SRF - subretinal fluid. EZ - ellipsoid zone. ERM -  epiretinal membrane. ORA - outer retinal atrophy. ORT - outer retinal tubulation. SRHM - subretinal hyper-reflective material. IRHM - intraretinal hyper-reflective material            ASSESSMENT/PLAN:    ICD-10-CM   1. Retinal hemorrhage of left eye  H35.62 OCT, Retina - OU - Both Eyes    2. Hypertensive retinopathy of both eyes  H35.033 OCT, Retina - OU - Both Eyes    3. Hx of LASIK  Z98.890       1. Retinal hemorrhages OS  - mild MA DBH superior periphery -- incidental finding on dilated exam (4.14.23) w/ Dr. Valetta Close  - today, hemes persist, but BCVA 20/20 OU and pt asymptomatic  - exam shows attenuated and tortuous venules just outside ST arcades amidst the retinal hemorrhages -- appearance suggestive of mild BRVO  - pt denies history of HTN, DM2, blood clots  - is on [facility] for weight loss and estradiol patch for hormone  replacement post hysterectomy (hx of cervical ca)  - OCT w/o retinal edema  - FA 4.18.23 without leakage or vasculitis  - no retinal or ophthalmic interventions indicated or recommended  - monitor  - f/u in 4 months, sooner prn -- DFE/OCT  2. Hypertensive retinopathy OU  - pt does not carry formal diagnosis of HTN, and is not on any BP meds - discussed importance of tight BP control - recommend home monitoring of BP w/ home cuff - monitor  3. History of LASIK OU  - stable  Ophthalmic Meds Ordered this visit:  No orders of the defined types were placed in this encounter.    Return in about 4 months (around 03/07/2022) for f/u retinal hemes OS, DFE, OCT.  There are no Patient Instructions on file for this visit.   Explained the diagnoses, plan, and follow up with the patient and they expressed understanding.  Patient expressed understanding of the importance of proper follow up care.   This document serves as a record of services personally performed by Gardiner Sleeper, MD, PhD. It was created on their behalf by Orvan Falconer, an ophthalmic technician. The creation of this record is the provider's dictation and/or activities during the visit.    Electronically signed by: Orvan Falconer, OA, 11/08/21  1:33 AM  This document serves as a record of services personally performed by Gardiner Sleeper, MD, PhD. It was created on their behalf by San Jetty. Owens Shark, OA an ophthalmic technician. The creation of this record is the provider's dictation and/or activities during the visit.    Electronically signed by: San Jetty. Owens Shark, [place] 06.07.2023 1:33 AM  Gardiner Sleeper, M.D., Ph.D. Diseases & Surgery of the Retina and Vitreous [facility]  I have reviewed the above documentation for accuracy and completeness, and I agree with the above. Gardiner Sleeper, M.D., Ph.D. 11/08/21 1:36 AM    Abbreviations: M myopia (nearsighted); A astigmatism; H hyperopia (farsighted);  P presbyopia; Mrx spectacle prescription;  CTL contact lenses; OD right eye; OS left eye; OU both eyes  XT exotropia; ET esotropia; PEK punctate epithelial keratitis; PEE punctate epithelial erosions; DES dry eye syndrome; MGD meibomian gland dysfunction; ATs artificial tears; PFAT's preservative free artificial tears; [place] nuclear sclerotic cataract; PSC posterior subcapsular cataract; ERM epi-retinal membrane; PVD posterior vitreous detachment; RD retinal detachment; DM diabetes mellitus; DR diabetic retinopathy; NPDR non-proliferative diabetic retinopathy; PDR proliferative diabetic retinopathy; CSME clinically significant macular edema; DME diabetic macular edema; dbh dot blot hemorrhages; CWS  cotton wool spot; POAG primary open angle glaucoma; C/D cup-to-disc ratio; HVF humphrey visual field; GVF goldmann visual field; OCT optical coherence tomography; IOP intraocular pressure; BRVO Branch retinal vein occlusion; CRVO central retinal vein occlusion; CRAO central retinal artery occlusion; BRAO branch retinal artery occlusion; RT retinal tear; SB scleral buckle; PPV pars plana vitrectomy; VH Vitreous hemorrhage; PRP panretinal laser photocoagulation; IVK intravitreal kenalog; VMT vitreomacular traction; MH Macular hole;  NVD neovascularization of the disc; NVE neovascularization elsewhere; AREDS age related eye disease study; ARMD age related macular degeneration; POAG primary open angle glaucoma; EBMD epithelial/anterior basement membrane dystrophy; ACIOL anterior chamber intraocular lens; IOL intraocular lens; PCIOL posterior chamber intraocular lens; Phaco/IOL phacoemulsification with intraocular lens placement; [place] photorefractive keratectomy; LASIK laser assisted in situ keratomileusis; HTN hypertension; DM diabetes mellitus; COPD chronic obstructive pulmonary disease

## 2021-11-03 ENCOUNTER — Other Ambulatory Visit: Payer: Self-pay | Admitting: Medical

## 2021-11-04 ENCOUNTER — Other Ambulatory Visit (HOSPITAL_COMMUNITY): Payer: Self-pay

## 2021-11-04 ENCOUNTER — Other Ambulatory Visit: Payer: Self-pay | Admitting: Medical

## 2021-11-04 DIAGNOSIS — J3089 Other allergic rhinitis: Secondary | ICD-10-CM | POA: Diagnosis not present

## 2021-11-04 DIAGNOSIS — J3081 Allergic rhinitis due to animal (cat) (dog) hair and dander: Secondary | ICD-10-CM | POA: Diagnosis not present

## 2021-11-04 DIAGNOSIS — J301 Allergic rhinitis due to pollen: Secondary | ICD-10-CM | POA: Diagnosis not present

## 2021-11-04 MED ORDER — ONDANSETRON HCL 4 MG PO TABS
4.0000 mg | ORAL_TABLET | Freq: Three times a day (TID) | ORAL | 1 refills | Status: DC | PRN
  Filled 2021-11-04: qty 20, 7d supply, fill #0
  Filled 2021-12-24: qty 20, 7d supply, fill #1

## 2021-11-04 MED ORDER — WEGOVY 1 MG/0.5ML ~~LOC~~ SOAJ
1.0000 mg | SUBCUTANEOUS | 1 refills | Status: DC
  Filled 2021-11-04: qty 2, 28d supply, fill #0
  Filled 2021-12-13: qty 2, 28d supply, fill #1

## 2021-11-04 MED ORDER — MONTELUKAST SODIUM 10 MG PO TABS
10.0000 mg | ORAL_TABLET | Freq: Every evening | ORAL | 1 refills | Status: DC
  Filled 2021-11-04: qty 30, 30d supply, fill #0
  Filled 2021-11-27: qty 30, 30d supply, fill #1

## 2021-11-04 NOTE — Telephone Encounter (Signed)
Pt would like to increase to next dose and would also like a refill on zofran

## 2021-11-05 ENCOUNTER — Other Ambulatory Visit (HOSPITAL_COMMUNITY): Payer: Self-pay

## 2021-11-05 ENCOUNTER — Encounter (INDEPENDENT_AMBULATORY_CARE_PROVIDER_SITE_OTHER): Payer: Self-pay | Admitting: Ophthalmology

## 2021-11-05 ENCOUNTER — Other Ambulatory Visit: Payer: Self-pay | Admitting: Medical

## 2021-11-05 ENCOUNTER — Ambulatory Visit (INDEPENDENT_AMBULATORY_CARE_PROVIDER_SITE_OTHER): Payer: 59 | Admitting: Ophthalmology

## 2021-11-05 DIAGNOSIS — H35033 Hypertensive retinopathy, bilateral: Secondary | ICD-10-CM | POA: Diagnosis not present

## 2021-11-05 DIAGNOSIS — H3562 Retinal hemorrhage, left eye: Secondary | ICD-10-CM | POA: Diagnosis not present

## 2021-11-05 DIAGNOSIS — I10 Essential (primary) hypertension: Secondary | ICD-10-CM

## 2021-11-05 DIAGNOSIS — Z9889 Other specified postprocedural states: Secondary | ICD-10-CM

## 2021-11-05 MED ORDER — ROSUVASTATIN CALCIUM 10 MG PO TABS
10.0000 mg | ORAL_TABLET | Freq: Every day | ORAL | 1 refills | Status: DC
  Filled 2021-11-05: qty 90, 90d supply, fill #0
  Filled 2022-02-04: qty 90, 90d supply, fill #1

## 2021-11-08 ENCOUNTER — Encounter (INDEPENDENT_AMBULATORY_CARE_PROVIDER_SITE_OTHER): Payer: Self-pay | Admitting: Ophthalmology

## 2021-11-10 ENCOUNTER — Other Ambulatory Visit (HOSPITAL_COMMUNITY): Payer: Self-pay

## 2021-11-10 DIAGNOSIS — J3089 Other allergic rhinitis: Secondary | ICD-10-CM | POA: Diagnosis not present

## 2021-11-10 DIAGNOSIS — J3081 Allergic rhinitis due to animal (cat) (dog) hair and dander: Secondary | ICD-10-CM | POA: Diagnosis not present

## 2021-11-10 DIAGNOSIS — J301 Allergic rhinitis due to pollen: Secondary | ICD-10-CM | POA: Diagnosis not present

## 2021-11-11 ENCOUNTER — Encounter: Payer: 59 | Admitting: Medical

## 2021-11-13 ENCOUNTER — Other Ambulatory Visit (HOSPITAL_COMMUNITY): Payer: Self-pay

## 2021-11-13 ENCOUNTER — Telehealth: Payer: 59 | Admitting: Physician Assistant

## 2021-11-13 DIAGNOSIS — J019 Acute sinusitis, unspecified: Secondary | ICD-10-CM | POA: Diagnosis not present

## 2021-11-13 DIAGNOSIS — B9689 Other specified bacterial agents as the cause of diseases classified elsewhere: Secondary | ICD-10-CM | POA: Diagnosis not present

## 2021-11-13 MED ORDER — LEVOCETIRIZINE DIHYDROCHLORIDE 5 MG PO TABS
ORAL_TABLET | ORAL | 0 refills | Status: DC
  Filled 2021-11-13: qty 30, 30d supply, fill #0

## 2021-11-13 MED ORDER — AMOXICILLIN-POT CLAVULANATE 875-125 MG PO TABS: 1.0000 | ORAL_TABLET | Freq: Two times a day (BID) | ORAL | 0 refills | Status: DC

## 2021-11-13 NOTE — Progress Notes (Signed)
I have spent 5 minutes in review of e-visit questionnaire, review and updating patient chart, medical decision making and response to patient.   Dolph Tavano Cody D'Arcy Abraha, PA-C    

## 2021-11-13 NOTE — Progress Notes (Signed)

## 2021-11-20 ENCOUNTER — Other Ambulatory Visit (HOSPITAL_COMMUNITY): Payer: Self-pay

## 2021-11-20 DIAGNOSIS — J3089 Other allergic rhinitis: Secondary | ICD-10-CM | POA: Diagnosis not present

## 2021-11-20 DIAGNOSIS — Z130 Encounter for screening for diseases of the blood and blood-forming organs and certain disorders involving the immune mechanism: Secondary | ICD-10-CM | POA: Diagnosis not present

## 2021-11-20 DIAGNOSIS — N898 Other specified noninflammatory disorders of vagina: Secondary | ICD-10-CM | POA: Diagnosis not present

## 2021-11-20 DIAGNOSIS — D069 Carcinoma in situ of cervix, unspecified: Secondary | ICD-10-CM | POA: Diagnosis not present

## 2021-11-20 DIAGNOSIS — Z01419 Encounter for gynecological examination (general) (routine) without abnormal findings: Secondary | ICD-10-CM | POA: Diagnosis not present

## 2021-11-20 DIAGNOSIS — J3081 Allergic rhinitis due to animal (cat) (dog) hair and dander: Secondary | ICD-10-CM | POA: Diagnosis not present

## 2021-11-20 DIAGNOSIS — J301 Allergic rhinitis due to pollen: Secondary | ICD-10-CM | POA: Diagnosis not present

## 2021-11-20 DIAGNOSIS — Z1389 Encounter for screening for other disorder: Secondary | ICD-10-CM | POA: Diagnosis not present

## 2021-11-21 LAB — HM PAP SMEAR
HM Pap smear: NEGATIVE
HPV, high-risk: NEGATIVE

## 2021-11-21 LAB — RESULTS CONSOLE HPV: CHL HPV: NEGATIVE

## 2021-11-26 DIAGNOSIS — J301 Allergic rhinitis due to pollen: Secondary | ICD-10-CM | POA: Diagnosis not present

## 2021-11-26 DIAGNOSIS — J3089 Other allergic rhinitis: Secondary | ICD-10-CM | POA: Diagnosis not present

## 2021-11-26 DIAGNOSIS — J3081 Allergic rhinitis due to animal (cat) (dog) hair and dander: Secondary | ICD-10-CM | POA: Diagnosis not present

## 2021-11-27 ENCOUNTER — Other Ambulatory Visit (HOSPITAL_COMMUNITY): Payer: Self-pay

## 2021-12-03 ENCOUNTER — Other Ambulatory Visit (HOSPITAL_COMMUNITY): Payer: Self-pay

## 2021-12-03 MED ORDER — ALBUTEROL SULFATE HFA 108 (90 BASE) MCG/ACT IN AERS
INHALATION_SPRAY | RESPIRATORY_TRACT | 0 refills | Status: DC
  Filled 2021-12-03: qty 6.7, 30d supply, fill #0

## 2021-12-04 ENCOUNTER — Other Ambulatory Visit (HOSPITAL_COMMUNITY): Payer: Self-pay

## 2021-12-04 DIAGNOSIS — J3089 Other allergic rhinitis: Secondary | ICD-10-CM | POA: Diagnosis not present

## 2021-12-04 DIAGNOSIS — J3081 Allergic rhinitis due to animal (cat) (dog) hair and dander: Secondary | ICD-10-CM | POA: Diagnosis not present

## 2021-12-04 DIAGNOSIS — J301 Allergic rhinitis due to pollen: Secondary | ICD-10-CM | POA: Diagnosis not present

## 2021-12-09 ENCOUNTER — Other Ambulatory Visit (HOSPITAL_COMMUNITY): Payer: Self-pay

## 2021-12-09 DIAGNOSIS — J3089 Other allergic rhinitis: Secondary | ICD-10-CM | POA: Diagnosis not present

## 2021-12-09 DIAGNOSIS — H1045 Other chronic allergic conjunctivitis: Secondary | ICD-10-CM | POA: Diagnosis not present

## 2021-12-09 DIAGNOSIS — J4520 Mild intermittent asthma, uncomplicated: Secondary | ICD-10-CM | POA: Diagnosis not present

## 2021-12-09 DIAGNOSIS — J301 Allergic rhinitis due to pollen: Secondary | ICD-10-CM | POA: Diagnosis not present

## 2021-12-09 MED ORDER — ALBUTEROL SULFATE HFA 108 (90 BASE) MCG/ACT IN AERS
INHALATION_SPRAY | RESPIRATORY_TRACT | 0 refills | Status: DC
  Filled 2021-12-09: qty 6.7, 16d supply, fill #0

## 2021-12-09 MED ORDER — MONTELUKAST SODIUM 10 MG PO TABS
ORAL_TABLET | ORAL | 2 refills | Status: DC
  Filled 2021-12-24: qty 90, 90d supply, fill #0
  Filled 2022-04-13: qty 90, 90d supply, fill #1
  Filled 2022-07-15 – 2022-07-23 (×3): qty 90, 90d supply, fill #2

## 2021-12-09 MED ORDER — FLUTICASONE PROPIONATE 50 MCG/ACT NA SUSP
NASAL | 2 refills | Status: DC
  Filled 2021-12-09 – 2022-01-20 (×2): qty 48, 90d supply, fill #0
  Filled 2022-04-30: qty 48, 90d supply, fill #1
  Filled 2022-07-23 (×2): qty 48, 90d supply, fill #2

## 2021-12-09 MED ORDER — LEVOCETIRIZINE DIHYDROCHLORIDE 5 MG PO TABS
ORAL_TABLET | ORAL | 2 refills | Status: DC
  Filled 2021-12-09: qty 90, 90d supply, fill #0
  Filled 2022-03-18: qty 90, 90d supply, fill #1
  Filled 2022-06-08 (×2): qty 90, 90d supply, fill #2

## 2021-12-09 MED ORDER — AZELASTINE HCL 0.05 % OP SOLN
OPHTHALMIC | 3 refills | Status: AC
Start: 2021-12-09 — End: ?
  Filled 2021-12-09: qty 6, 24d supply, fill #0
  Filled 2022-08-19: qty 6, 24d supply, fill #1
  Filled 2022-09-15: qty 6, 24d supply, fill #2

## 2021-12-13 ENCOUNTER — Other Ambulatory Visit: Payer: Self-pay | Admitting: Medical

## 2021-12-13 ENCOUNTER — Other Ambulatory Visit (HOSPITAL_COMMUNITY): Payer: Self-pay

## 2021-12-15 ENCOUNTER — Other Ambulatory Visit (HOSPITAL_COMMUNITY): Payer: Self-pay

## 2021-12-15 DIAGNOSIS — J3081 Allergic rhinitis due to animal (cat) (dog) hair and dander: Secondary | ICD-10-CM | POA: Diagnosis not present

## 2021-12-15 DIAGNOSIS — J3089 Other allergic rhinitis: Secondary | ICD-10-CM | POA: Diagnosis not present

## 2021-12-15 DIAGNOSIS — J301 Allergic rhinitis due to pollen: Secondary | ICD-10-CM | POA: Diagnosis not present

## 2021-12-15 MED ORDER — SERTRALINE HCL 100 MG PO TABS
ORAL_TABLET | Freq: Every day | ORAL | 0 refills | Status: DC
  Filled 2021-12-15 – 2022-02-27 (×2): qty 90, 90d supply, fill #0

## 2021-12-24 ENCOUNTER — Other Ambulatory Visit (HOSPITAL_COMMUNITY): Payer: Self-pay

## 2021-12-26 DIAGNOSIS — J3081 Allergic rhinitis due to animal (cat) (dog) hair and dander: Secondary | ICD-10-CM | POA: Diagnosis not present

## 2021-12-26 DIAGNOSIS — J301 Allergic rhinitis due to pollen: Secondary | ICD-10-CM | POA: Diagnosis not present

## 2021-12-26 DIAGNOSIS — J3089 Other allergic rhinitis: Secondary | ICD-10-CM | POA: Diagnosis not present

## 2022-01-06 ENCOUNTER — Other Ambulatory Visit (HOSPITAL_COMMUNITY): Payer: Self-pay

## 2022-01-06 MED ORDER — ESTRADIOL 0.1 MG/24HR TD PTTW
MEDICATED_PATCH | TRANSDERMAL | 4 refills | Status: DC
Start: 2022-01-06 — End: 2022-12-22
  Filled 2022-01-06: qty 24, 84d supply, fill #0
  Filled 2022-03-25: qty 24, 84d supply, fill #1
  Filled 2022-07-14 – 2022-07-23 (×3): qty 24, 84d supply, fill #2
  Filled 2022-10-15: qty 24, 84d supply, fill #3

## 2022-01-07 ENCOUNTER — Other Ambulatory Visit (HOSPITAL_COMMUNITY): Payer: Self-pay

## 2022-01-12 ENCOUNTER — Telehealth: Payer: Self-pay | Admitting: *Deleted

## 2022-01-12 ENCOUNTER — Other Ambulatory Visit: Payer: 59

## 2022-01-12 DIAGNOSIS — J301 Allergic rhinitis due to pollen: Secondary | ICD-10-CM | POA: Diagnosis not present

## 2022-01-12 DIAGNOSIS — J3089 Other allergic rhinitis: Secondary | ICD-10-CM | POA: Diagnosis not present

## 2022-01-12 DIAGNOSIS — J3081 Allergic rhinitis due to animal (cat) (dog) hair and dander: Secondary | ICD-10-CM | POA: Diagnosis not present

## 2022-01-12 NOTE — Telephone Encounter (Signed)
Patient was here for weight check today. Her weight was 202.2lb. She mentioned this was for a PA for Wegovy-and she has a CPE scheduled with you 01/20/22.

## 2022-01-14 ENCOUNTER — Telehealth: Payer: Self-pay | Admitting: Medical

## 2022-01-14 NOTE — Telephone Encounter (Signed)
P.A. WEGOVY renewal completed

## 2022-01-15 ENCOUNTER — Other Ambulatory Visit (HOSPITAL_COMMUNITY): Payer: Self-pay

## 2022-01-15 NOTE — Telephone Encounter (Signed)
PA completed.

## 2022-01-16 NOTE — Telephone Encounter (Signed)
P.A. approved til 01/15/23, sent mychart message

## 2022-01-20 ENCOUNTER — Encounter: Payer: Self-pay | Admitting: Medical

## 2022-01-20 ENCOUNTER — Ambulatory Visit (INDEPENDENT_AMBULATORY_CARE_PROVIDER_SITE_OTHER): Payer: 59 | Admitting: Medical

## 2022-01-20 ENCOUNTER — Other Ambulatory Visit (HOSPITAL_COMMUNITY): Payer: Self-pay

## 2022-01-20 VITALS — BP 120/80 | HR 94 | Ht 70.0 in | Wt 202.2 lb

## 2022-01-20 DIAGNOSIS — Z13220 Encounter for screening for lipoid disorders: Secondary | ICD-10-CM

## 2022-01-20 DIAGNOSIS — Z90710 Acquired absence of both cervix and uterus: Secondary | ICD-10-CM

## 2022-01-20 DIAGNOSIS — Z862 Personal history of diseases of the blood and blood-forming organs and certain disorders involving the immune mechanism: Secondary | ICD-10-CM

## 2022-01-20 DIAGNOSIS — E785 Hyperlipidemia, unspecified: Secondary | ICD-10-CM | POA: Diagnosis not present

## 2022-01-20 DIAGNOSIS — K760 Fatty (change of) liver, not elsewhere classified: Secondary | ICD-10-CM

## 2022-01-20 DIAGNOSIS — F419 Anxiety disorder, unspecified: Secondary | ICD-10-CM | POA: Diagnosis not present

## 2022-01-20 DIAGNOSIS — Z0000 Encounter for general adult medical examination without abnormal findings: Secondary | ICD-10-CM | POA: Diagnosis not present

## 2022-01-20 DIAGNOSIS — K219 Gastro-esophageal reflux disease without esophagitis: Secondary | ICD-10-CM

## 2022-01-20 DIAGNOSIS — R7301 Impaired fasting glucose: Secondary | ICD-10-CM

## 2022-01-20 DIAGNOSIS — J3089 Other allergic rhinitis: Secondary | ICD-10-CM | POA: Diagnosis not present

## 2022-01-20 DIAGNOSIS — E669 Obesity, unspecified: Secondary | ICD-10-CM

## 2022-01-20 DIAGNOSIS — Z131 Encounter for screening for diabetes mellitus: Secondary | ICD-10-CM | POA: Diagnosis not present

## 2022-01-20 DIAGNOSIS — J3081 Allergic rhinitis due to animal (cat) (dog) hair and dander: Secondary | ICD-10-CM | POA: Diagnosis not present

## 2022-01-20 DIAGNOSIS — E559 Vitamin D deficiency, unspecified: Secondary | ICD-10-CM

## 2022-01-20 DIAGNOSIS — Z7185 Encounter for immunization safety counseling: Secondary | ICD-10-CM

## 2022-01-20 DIAGNOSIS — M5137 Other intervertebral disc degeneration, lumbosacral region: Secondary | ICD-10-CM

## 2022-01-20 DIAGNOSIS — F3281 Premenstrual dysphoric disorder: Secondary | ICD-10-CM

## 2022-01-20 DIAGNOSIS — Z79899 Other long term (current) drug therapy: Secondary | ICD-10-CM

## 2022-01-20 DIAGNOSIS — J4530 Mild persistent asthma, uncomplicated: Secondary | ICD-10-CM

## 2022-01-20 DIAGNOSIS — J301 Allergic rhinitis due to pollen: Secondary | ICD-10-CM | POA: Diagnosis not present

## 2022-01-20 DIAGNOSIS — M51379 Other intervertebral disc degeneration, lumbosacral region without mention of lumbar back pain or lower extremity pain: Secondary | ICD-10-CM

## 2022-01-20 DIAGNOSIS — G4700 Insomnia, unspecified: Secondary | ICD-10-CM

## 2022-01-20 DIAGNOSIS — N3281 Overactive bladder: Secondary | ICD-10-CM

## 2022-01-20 LAB — POCT URINALYSIS DIP (PROADVANTAGE DEVICE)
Blood, UA: NEGATIVE
Glucose, UA: NEGATIVE mg/dL
Ketones, POC UA: NEGATIVE mg/dL
Leukocytes, UA: NEGATIVE
Nitrite, UA: NEGATIVE
Protein Ur, POC: NEGATIVE mg/dL
Specific Gravity, Urine: 1.03
Urobilinogen, Ur: NEGATIVE
pH, UA: 6 (ref 5.0–8.0)

## 2022-01-20 LAB — POC COVID19 BINAXNOW: SARS Coronavirus 2 Ag: NEGATIVE

## 2022-01-20 MED ORDER — TRAZODONE HCL 50 MG PO TABS
25.0000 mg | ORAL_TABLET | Freq: Every evening | ORAL | 3 refills | Status: DC | PRN
  Filled 2022-01-20: qty 90, 90d supply, fill #0

## 2022-01-20 MED ORDER — ALBUTEROL SULFATE HFA 108 (90 BASE) MCG/ACT IN AERS
INHALATION_SPRAY | RESPIRATORY_TRACT | 2 refills | Status: AC
Start: 2022-01-20 — End: ?
  Filled 2022-01-20: qty 6.7, 16d supply, fill #0

## 2022-01-20 MED ORDER — EPINEPHRINE 0.3 MG/0.3ML IJ SOAJ
INTRAMUSCULAR | 1 refills | Status: DC
  Filled 2022-01-20: qty 2, 30d supply, fill #0

## 2022-01-20 MED ORDER — HYDROCORTISONE 2.5 % EX CREA
TOPICAL_CREAM | Freq: Two times a day (BID) | CUTANEOUS | 0 refills | Status: DC
  Filled 2022-01-20: qty 30, 10d supply, fill #0

## 2022-01-20 NOTE — Progress Notes (Signed)
Subjective:   HPI  Michelle Griffin is a 51 y.o. female who presents for Chief Complaint  Patient presents with   fasting cpe    Fasting cpe, will get flu shot at work, obgyn- Paula Compton    Patient Care Team: Glade Lloyd Leward Quan as PCP - General (Family Medicine) Sees dentist Sees eye doctor Dr. Clarene Essex, GI Dr. Paula Compton, [facility] Dr. Mosetta Anis, allergist   Concerns: She continues on [facility] for weight loss.  She has seen success with this.  She does get some GI issues alternating from some constipation to loose stools.  She thinks she may have recurrent hemorrhoid.  Its not painful but she feels something of a fullness there.  No blood in the stool.  Otherwise normal state of health.  She has had some hemorrhage behind the eye per eye doctor.  They want her to make sure blood pressures are running normal.  She says sometimes at home she gets slightly elevated numbers around 130/80 or a little higher.  No chest pain or palpitations.  No edema  She also feels a slight bit of sinus congestion.  She was out of town in [place] over the weekend and wants to make sure she does not have COVID.  She does not feel sick otherwise.  Reviewed their medical, surgical, family, social, medication, and allergy history and updated chart as appropriate.   Past Medical History:  Diagnosis Date   Adenocarcinoma in situ of cervix    Allergy    sees [name]   Anemia    hx/o iron deficiency due to heavy periods   Asthma    exercise induced   Bursitis    Cancer ([place])    cervical   COVID-19 05/26/2019   fatigue, h/a, loss of taste and smell, fatige x 1 month, loss of taste x 5 months all symptoms rsolved   DDD (degenerative disc disease), lumbar    Fatty liver    GERD (gastroesophageal reflux disease)    [name]   H/O mammogram    Hiatal hernia    small per EGD, [name]   Hyperlipidemia 2012   was on pravastatin prior   Migraine    prior eval  with neurologist, Dr. Melton Alar   Overactive bladder    PMDD (premenstrual dysphoric disorder)    Pyelonephritis    age 30yo   Uterine infection 2015   Vitamin D deficiency    Wears glasses     Family History  Problem Relation Age of Onset   Cataracts Mother    Lupus Mother    Hypertension Mother    Hyperlipidemia Mother    Migraines Mother    Kidney disease Mother    Hypertension Father    Diabetes Father    Hyperlipidemia Father    Depression Father    Anxiety disorder Father    Dementia Father    Obesity Sister    Hypertension Sister    Diabetes Sister    Ovarian cysts Daughter    Heart disease Paternal Uncle        CABG   Hyperlipidemia Paternal Uncle    Cancer Paternal Uncle        multiple with colon cancer   Aplastic anemia Maternal Grandfather    AAA (abdominal aortic aneurysm) Maternal Grandfather    Cancer Cousin        breast   Cancer Cousin        ovarian   Stroke Neg  Hx      Current Outpatient Medications:    azelastine (OPTIVAR) 0.05 % ophthalmic solution, Instill 1 drop into the affected eye(s) twice daily as directed, Disp: 6 mL, Rfl: 3   Azelastine HCl 137 MCG/SPRAY SOLN, INSTILL 1 TO 2 SPRAYS INTO EACH NOTRIL TWICE DAILY, Disp: 30 mL, Rfl: 6   Ergocalciferol (VITAMIN D2) 50 MCG (2000 UT) TABS, Take 1 tablet by mouth daily., Disp: 90 tablet, Rfl: 3   estradiol (VIVELLE-DOT) 0.1 MG/24HR patch, APPLY 1 PATCH ONTO SKIN 2 TIMES A WEEK, Disp: 24 patch, Rfl: 4   fluticasone (FLONASE) 50 MCG/ACT nasal spray, Use 1-2 sprays in each nostril once to twice daily as directed (max 4 sprays per day), Disp: 48 g, Rfl: 2   hydrocortisone 2.5 % cream, Apply topically 2 (two) times daily., Disp: 30 g, Rfl: 0   levocetirizine (XYZAL) 5 MG tablet, Take 1 tablet by mouth once daily in the evening, Disp: 90 tablet, Rfl: 2   montelukast (SINGULAIR) 10 MG tablet, Take 1 tablet by mouth every evening, Disp: 90 tablet, Rfl: 2   ondansetron (ZOFRAN) 4 MG tablet, Take 1 tablet  (4 mg total) by mouth every 8 (eight) hours as needed for nausea or vomiting., Disp: 20 tablet, Rfl: 1   pantoprazole (PROTONIX) 40 MG tablet, TAKE 1 TABLET BY MOUTH DAILY, Disp: 90 tablet, Rfl: 1   Probiotic Product (RESTORA) CAPS, Take 1 capsule by mouth daily., Disp: 90 capsule, Rfl: 3   Semaglutide-Weight Management (WEGOVY) 1 MG/0.5ML SOAJ, Inject 1 pen (1 mg ) into the skin once a week., Disp: 2 mL, Rfl: 1   sertraline (ZOLOFT) 100 MG tablet, TAKE 1 TABLET BY MOUTH DAILY., Disp: 90 tablet, Rfl: 0   albuterol (VENTOLIN HFA) 108 (90 Base) MCG/ACT inhaler, Inhale 1-2 puffs into the lungs every 4-6 hours as needed for cough or wheeze, Disp: 8 g, Rfl: 2   Blood Pressure Monitoring (OMRON 3 SERIES BP MONITOR) DEVI, Use as directed, Disp: 1 each, Rfl: 0   EPINEPHrine 0.3 mg/0.3 mL IJ SOAJ injection, Inject as directed into the muscle as needed for severe allergic reaction, Disp: 1 each, Rfl: 1   naltrexone (DEPADE) 50 MG tablet, Take 1/2 tablet by mouth daily. (Patient not taking: Reported on 01/20/2022), Disp: 30 tablet, Rfl: 2   rosuvastatin (CRESTOR) 10 MG tablet, Take 1 tablet (10 mg total) by mouth daily., Disp: 90 tablet, Rfl: 1   traZODone (DESYREL) 50 MG tablet, Take 0.5-1 tablets (25-50 mg total) by mouth at bedtime as needed for sleep., Disp: 90 tablet, Rfl: 3  Allergies  Allergen Reactions   Sulfa Antibiotics Hives   Ambien [Zolpidem]     Nightmares/vivid dreams   Percocet [Oxycodone-Acetaminophen] Itching    Oxycodone " makes me climb the wall, itching"      Review of Systems Constitutional: -fever, -chills, -sweats, -unexpected weight change, -decreased appetite, -fatigue Allergy: -sneezing, -itching, -congestion Dermatology: -changing moles, --rash, -lumps ENT: -runny nose, -ear pain, -sore throat, -hoarseness, -sinus pain, -teeth pain, - ringing in ears, -hearing loss, -nosebleeds Cardiology: -chest pain, -palpitations, -swelling, -difficulty breathing when lying flat,  -waking up short of breath Respiratory: -cough, -shortness of breath, -difficulty breathing with exercise or exertion, -wheezing, -coughing up blood Gastroenterology: -abdominal pain, +nausea, -vomiting, +diarrhea, +constipation, -blood in stool, -changes in bowel movement, -difficulty swallowing or eating Hematology: -bleeding, -bruising  Musculoskeletal: -joint aches, -muscle aches, -joint swelling, -back pain, -neck pain, -cramping, -changes in gait Ophthalmology: denies vision changes, eye redness, itching, discharge Urology: -  burning with urination, -difficulty urinating, -blood in urine, -urinary frequency, -urgency, -incontinence Neurology: -headache, -weakness, -tingling, -numbness, -memory loss, -falls, -dizziness Psychology: -depressed mood, -agitation, -sleep problems Breast/gyn: -breast tendnerss, -discharge, -lumps, -vaginal discharge,- irregular periods, -heavy periods      01/20/2022   10:25 AM 08/28/2021    8:25 AM 04/30/2021    9:08 AM 01/15/2021    8:57 AM 11/05/2020    9:25 AM  Depression screen PHQ 2/9  Decreased Interest 0 0 0 0 0  Down, Depressed, Hopeless 0 0 0 0 0  PHQ - 2 Score 0 0 0 0 0       Objective:  BP 120/80   Pulse 94   Ht 5' 10"  (1.778 m)   Wt 202 lb 3.2 oz (91.7 kg)   LMP 03/16/2015   BMI 29.01 kg/m    BP Readings from Last 3 Encounters:  01/20/22 120/80  08/28/21 120/70  04/30/21 120/80   Wt Readings from Last 3 Encounters:  01/20/22 202 lb 3.2 oz (91.7 kg)  08/28/21 207 lb 9.6 oz (94.2 kg)  04/30/21 217 lb (98.4 kg)     General appearance: alert, no distress, WD/WN, Caucasian female Skin: no worrisome lesions, tattoo left medial foot along the sole of the foot, tender to right volar wrist HEENT: normocephalic, conjunctiva/corneas normal, sclerae anicteric, PERRLA, EOMi, nares patent, no discharge or erythema, pharynx normal Oral cavity: MMM, tongue normal, teeth normal Neck: supple, no lymphadenopathy, no thyromegaly, no masses,  normal ROM, no bruits Chest: non tender, normal shape and expansion Heart: RRR, normal S1, S2, no murmurs Lungs: CTA bilaterally, no wheezes, rhonchi, or rales Abdomen: +bs, soft, non tender, non distended, no masses, no hepatomegaly, no splenomegaly, no bruits Back: non tender, normal ROM, no scoliosis Musculoskeletal: upper extremities non tender, no obvious deformity, normal ROM throughout, lower extremities non tender, no obvious deformity, normal ROM throughout Extremities: no edema, no cyanosis, no clubbing Pulses: 2+ symmetric, upper and lower extremities, normal cap refill Neurological: alert, oriented x 3, CN2-12 intact, strength normal upper extremities and lower extremities, sensation normal throughout, DTRs 2+ throughout, no cerebellar signs, gait normal Psychiatric: normal affect, behavior normal, pleasant  Breast/gyn - deferred to gynecology  On the left side of the anus there is a small pea size flesh-colored lump that is seen swollen suggestive of hemorrhoid but not thrombosed.  No other abnormality.  Exam chaperoned by nurse   Assessment and Plan :   Encounter Diagnoses  Name Primary?   Encounter for health maintenance examination in adult Yes   Mild persistent asthma without complication    Anxiety    Vitamin D deficiency    Vaccine counseling    Screening for lipid disorders    Screening for diabetes mellitus    S/P hysterectomy    S/P laparoscopic assisted vaginal hysterectomy (LAVH)    Allergic rhinitis due to pollen, unspecified seasonality    PMDD (premenstrual dysphoric disorder)    Obesity with serious comorbidity, unspecified classification, unspecified obesity type    OAB (overactive bladder)    Insomnia, unspecified type    Impaired fasting blood sugar    Hyperlipidemia, unspecified hyperlipidemia type    Gastroesophageal reflux disease, unspecified whether esophagitis present    High risk medication use    Fatty liver    DEGENERATIVE DISC DISEASE,  LUMBAR SPINE    History of anemia      This visit was a preventative care visit, also known as wellness visit or routine physical.   Topics  typically include healthy lifestyle, diet, exercise, preventative care, vaccinations, sick and well care, proper use of emergency dept and after hours care, as well as other concerns.     Recommendations: Continue to return yearly for your annual wellness and preventative care visits.  This gives [name] a chance to discuss healthy lifestyle, exercise, vaccinations, review your chart record, and perform screenings where appropriate.  I recommend you see your eye doctor yearly for routine vision care.  I recommend you see your dentist yearly for routine dental care including hygiene visits twice yearly.  See your gynecologist yearly for routine gynecological care.   Vaccination recommendations were reviewed Immunization History  Administered Date(s) Administered   Hepatitis B 12/05/1973, 01/14/1994, 06/30/1994   Influenza Split 03/11/2015, 03/21/2021   Influenza, High Dose Seasonal PF 03/27/2019, 03/26/2020   Influenza-Unspecified 02/10/2004, 02/03/2013, 03/11/2015, 02/29/2016, 03/10/2016, 03/11/2017, 03/12/2017, 03/21/2018   MMR 09/16/1973, 06/11/2006   PFIZER(Purple Top)SARS-COV-2 Vaccination 07/28/2019, 08/18/2019, 03/15/2020, 03/26/2020   Pneumococcal Polysaccharide-23 02/17/2016, 11/05/2020   Rubella 12/05/1993, 12/05/1993   Td 06/01/1990, 05/12/2001, 06/16/2018   Tdap 06/14/2007   Tetanus 06/01/2008   I recommend a yearly flu shot in the fall.    Screening for cancer: Colon cancer screening: I reviewed your colonoscopy on file that is up to date from 09/2020  Breast cancer screening: You should perform a self breast exam monthly.   We reviewed recommendations for regular mammograms and breast cancer screening.  Cervical cancer screening: We reviewed recommendations for pap smear screening.  S/p hysterectomy  Skin cancer  screening: Check your skin regularly for new changes, growing lesions, or other lesions of concern Come in for evaluation if you have skin lesions of concern.  Lung cancer screening: If you have a greater than 20 pack year history of tobacco use, then you may qualify for lung cancer screening with a chest CT scan.   Please call your insurance company to inquire about coverage for this test.  We currently don't have screenings for other cancers besides breast, cervical, colon, and lung cancers.  If you have a strong family history of cancer or have other cancer screening concerns, please let me know.    Bone health: Get at least 150 minutes of aerobic exercise weekly Get weight bearing exercise at least once weekly Bone density test:  A bone density test is an imaging test that uses a type of X-ray to measure the amount of calcium and other minerals in your bones. The test may be used to diagnose or screen you for a condition that causes weak or thin bones (osteoporosis), predict your risk for a broken bone (fracture), or determine how well your osteoporosis treatment is working. The bone density test is recommended for females 17 and older, or females or males <77 if certain risk factors such as thyroid disease, long term use of steroids such as for asthma or rheumatological issues, vitamin D deficiency, estrogen deficiency, family history of osteoporosis, self or family history of fragility fracture in first degree relative.    Heart health: Get at least 150 minutes of aerobic exercise weekly Limit alcohol It is important to maintain a healthy blood pressure and healthy cholesterol numbers  Heart disease screening: Screening for heart disease includes screening for blood pressure, fasting lipids, glucose/diabetes screening, BMI height to weight ratio, reviewed of smoking status, physical activity, and diet.    Goals include blood pressure 120/80 or less, maintaining a healthy  lipid/cholesterol profile, preventing diabetes or keeping diabetes numbers under good control, not smoking  or using tobacco products, exercising most days per week or at least 150 minutes per week of exercise, and eating healthy variety of fruits and vegetables, healthy oils, and avoiding unhealthy food choices like fried food, fast food, high sugar and high cholesterol foods.    She reports a normal CT coronary test back in 2020.  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  Back in 2020 she was a test patient when they were up starting the CT coronary test program.  She does not have a physical copy of the report and there is no dictated report since she was not officially seen as a patient but she notes the test was normal.  This was per Dr. Meda Coffee, cardiology   Medical care options: I recommend you continue to seek care here first for routine care.  We try really hard to have available appointments Monday through Friday daytime hours for sick visits, acute visits, and physicals.  Urgent care should be used for after hours and weekends for significant issues that cannot wait till the next day.  The emergency department should be used for significant potentially life-threatening emergencies.  The emergency department is expensive, can often have long wait times for less significant concerns, so try to utilize primary care, urgent care, or telemedicine when possible to avoid unnecessary trips to the emergency department.  Virtual visits and telemedicine have been introduced since the pandemic started in 2020, and can be convenient ways to receive medical care.  We offer virtual appointments as well to assist you in a variety of options to seek medical care.    Separate significant issues discussed: Obesity-discussed need to continue efforts to lose weight through healthy diet and exercise, wegovy.  Fatty liver disease- continue efforts with weight loss and low-fat  low-cholesterol diet.    Hyperlipidemia- c/t statin,  Labs today  Asthma-no recent problems  Vitamin D deficiency-continue supplement  Impaired glucose - labs today  Hemorrhoid -I prescribed today hydrocortisone cream and advised cream twice a day and hot soapy bath for the next few days.  If the area of hemorrhoid does not resolve within a week let me know.  Rakhi was seen today for fasting cpe.  Diagnoses and all orders for this visit:  Encounter for health maintenance examination in adult -     Comprehensive metabolic panel -     CBC -     Lipid panel -     Hemoglobin A1c -     VITAMIN D 25 Hydroxy (Vit-D Deficiency, Fractures) -     Iron  Mild persistent asthma without complication  Anxiety  Vitamin D deficiency -     VITAMIN D 25 Hydroxy (Vit-D Deficiency, Fractures)  Vaccine counseling  Screening for lipid disorders -     Lipid panel  Screening for diabetes mellitus -     Hemoglobin A1c  S/P hysterectomy  S/P laparoscopic assisted vaginal hysterectomy (LAVH)  Allergic rhinitis due to pollen, unspecified seasonality  PMDD (premenstrual dysphoric disorder)  Obesity with serious comorbidity, unspecified classification, unspecified obesity type  OAB (overactive bladder)  Insomnia, unspecified type  Impaired fasting blood sugar  Hyperlipidemia, unspecified hyperlipidemia type -     Lipid panel  Gastroesophageal reflux disease, unspecified whether esophagitis present  High risk medication use  Fatty liver  DEGENERATIVE DISC DISEASE, LUMBAR SPINE  History of anemia -     Iron  Other orders -     hydrocortisone 2.5 % cream; Apply topically  2 (two) times daily. -     traZODone (DESYREL) 50 MG tablet; Take 0.5-1 tablets (25-50 mg total) by mouth at bedtime as needed for sleep. -     albuterol (VENTOLIN HFA) 108 (90 Base) MCG/ACT inhaler; Inhale 1-2 puffs into the lungs every 4-6 hours as needed for cough or wheeze -     EPINEPHrine 0.3 mg/0.3 mL IJ  SOAJ injection; Inject as directed into the muscle as needed for severe allergic reaction    Follow-up pending labs, yearly for physical

## 2022-01-20 NOTE — Addendum Note (Signed)
Addended by: Minette Headland A on: 01/20/2022 11:42 AM   Modules accepted: Orders

## 2022-01-20 NOTE — Addendum Note (Signed)
Addended by: Minette Headland A on: 01/20/2022 11:43 AM   Modules accepted: Orders

## 2022-01-21 ENCOUNTER — Other Ambulatory Visit (HOSPITAL_COMMUNITY): Payer: Self-pay

## 2022-01-21 ENCOUNTER — Other Ambulatory Visit: Payer: Self-pay | Admitting: Medical

## 2022-01-21 LAB — COMPREHENSIVE METABOLIC PANEL
ALT: 31 IU/L (ref 0–32)
AST: 43 IU/L — ABNORMAL HIGH (ref 0–40)
Albumin/Globulin Ratio: 1.8 (ref 1.2–2.2)
Albumin: 4.8 g/dL (ref 3.9–4.9)
Alkaline Phosphatase: 104 IU/L (ref 44–121)
BUN/Creatinine Ratio: 13 (ref 9–23)
BUN: 10 mg/dL (ref 6–24)
Bilirubin Total: 0.4 mg/dL (ref 0.0–1.2)
CO2: 21 mmol/L (ref 20–29)
Calcium: 10 mg/dL (ref 8.7–10.2)
Chloride: 100 mmol/L (ref 96–106)
Creatinine, Ser: 0.78 mg/dL (ref 0.57–1.00)
Globulin, Total: 2.7 g/dL (ref 1.5–4.5)
Glucose: 84 mg/dL (ref 70–99)
Potassium: 4.6 mmol/L (ref 3.5–5.2)
Sodium: 142 mmol/L (ref 134–144)
Total Protein: 7.5 g/dL (ref 6.0–8.5)
eGFR: 93 mL/min/{1.73_m2} (ref >59)

## 2022-01-21 LAB — CBC
Hematocrit: 40.6 % (ref 34.0–46.6)
Hemoglobin: 13 g/dL (ref 11.1–15.9)
MCH: 26.7 pg (ref 26.6–33.0)
MCHC: 32 g/dL (ref 31.5–35.7)
MCV: 83 fL (ref 79–97)
Platelets: 244 10*3/uL (ref 150–450)
RBC: 4.87 x10E6/uL (ref 3.77–5.28)
RDW: 15.1 % (ref 11.7–15.4)
WBC: 8.4 10*3/uL (ref 3.4–10.8)

## 2022-01-21 LAB — VITAMIN D 25 HYDROXY (VIT D DEFICIENCY, FRACTURES): Vit D, 25-Hydroxy: 40.1 ng/mL (ref 30.0–100.0)

## 2022-01-21 LAB — IRON: Iron: 60 ug/dL (ref 27–159)

## 2022-01-21 LAB — LIPID PANEL
Chol/HDL Ratio: 2 ratio (ref 0.0–4.4)
Cholesterol, Total: 170 mg/dL (ref 100–199)
HDL: 85 mg/dL (ref >39)
LDL Chol Calc (NIH): 72 mg/dL (ref 0–99)
Triglycerides: 70 mg/dL (ref 0–149)
VLDL Cholesterol Cal: 13 mg/dL (ref 5–40)

## 2022-01-21 LAB — HEMOGLOBIN A1C
Est. average glucose Bld gHb Est-mCnc: 114 mg/dL
Hgb A1c MFr Bld: 5.6 % (ref 4.8–5.6)

## 2022-01-21 MED ORDER — WEGOVY 1.7 MG/0.75ML ~~LOC~~ SOAJ
1.7000 mg | SUBCUTANEOUS | 1 refills | Status: DC
Start: 2022-01-21 — End: 2022-04-02
  Filled 2022-01-21: qty 3, 28d supply, fill #0
  Filled 2022-02-27: qty 3, 28d supply, fill #1

## 2022-01-21 MED ORDER — ONDANSETRON HCL 4 MG PO TABS
4.0000 mg | ORAL_TABLET | Freq: Three times a day (TID) | ORAL | 0 refills | Status: DC | PRN
  Filled 2022-01-21: qty 90, 30d supply, fill #0

## 2022-01-21 MED ORDER — RESTORA PO CAPS
1.0000 | ORAL_CAPSULE | Freq: Every day | ORAL | 3 refills | Status: AC
  Filled 2022-01-21 – 2022-09-02 (×3): qty 120, 120d supply, fill #0

## 2022-01-21 MED ORDER — PANTOPRAZOLE SODIUM 40 MG PO TBEC
DELAYED_RELEASE_TABLET | Freq: Every day | ORAL | 1 refills | Status: DC
  Filled 2022-01-21: qty 90, fill #0
  Filled 2022-02-27: qty 90, 90d supply, fill #0
  Filled 2022-06-02: qty 90, 90d supply, fill #1

## 2022-01-22 ENCOUNTER — Telehealth: Payer: 59 | Admitting: Physician Assistant

## 2022-01-22 DIAGNOSIS — J310 Chronic rhinitis: Secondary | ICD-10-CM | POA: Diagnosis not present

## 2022-01-22 DIAGNOSIS — B9689 Other specified bacterial agents as the cause of diseases classified elsewhere: Secondary | ICD-10-CM | POA: Diagnosis not present

## 2022-01-22 DIAGNOSIS — J019 Acute sinusitis, unspecified: Secondary | ICD-10-CM

## 2022-01-22 MED ORDER — AMOXICILLIN-POT CLAVULANATE 875-125 MG PO TABS: 1.0000 | ORAL_TABLET | Freq: Two times a day (BID) | ORAL | 0 refills | Status: DC

## 2022-01-22 NOTE — Progress Notes (Signed)

## 2022-01-22 NOTE — Progress Notes (Signed)
I have spent 5 minutes in review of e-visit questionnaire, review and updating patient chart, medical decision making and response to patient.   Elihue Ebert Cody Marques Ericson, PA-C    

## 2022-02-04 ENCOUNTER — Other Ambulatory Visit (HOSPITAL_COMMUNITY): Payer: Self-pay

## 2022-02-10 DIAGNOSIS — J3081 Allergic rhinitis due to animal (cat) (dog) hair and dander: Secondary | ICD-10-CM | POA: Diagnosis not present

## 2022-02-10 DIAGNOSIS — J301 Allergic rhinitis due to pollen: Secondary | ICD-10-CM | POA: Diagnosis not present

## 2022-02-10 DIAGNOSIS — J3089 Other allergic rhinitis: Secondary | ICD-10-CM | POA: Diagnosis not present

## 2022-02-18 DIAGNOSIS — J301 Allergic rhinitis due to pollen: Secondary | ICD-10-CM | POA: Diagnosis not present

## 2022-02-19 DIAGNOSIS — J3089 Other allergic rhinitis: Secondary | ICD-10-CM | POA: Diagnosis not present

## 2022-02-19 DIAGNOSIS — J3081 Allergic rhinitis due to animal (cat) (dog) hair and dander: Secondary | ICD-10-CM | POA: Diagnosis not present

## 2022-02-20 DIAGNOSIS — J3081 Allergic rhinitis due to animal (cat) (dog) hair and dander: Secondary | ICD-10-CM | POA: Diagnosis not present

## 2022-02-20 DIAGNOSIS — J301 Allergic rhinitis due to pollen: Secondary | ICD-10-CM | POA: Diagnosis not present

## 2022-02-20 DIAGNOSIS — J3089 Other allergic rhinitis: Secondary | ICD-10-CM | POA: Diagnosis not present

## 2022-02-24 NOTE — Progress Notes (Signed)
[facility] - Clinic Note  03/10/2022     CHIEF COMPLAINT Patient presents for Retina Follow Up   HISTORY OF PRESENT ILLNESS: Michelle Griffin is a 51 y.o. female who presents to the clinic today for:   HPI     Retina Follow Up   Patient presents with  Other.  In left eye.  Severity is moderate.  Duration of 4 months.  Since onset it is stable.  I, the attending physician,  performed the HPI with the patient and updated documentation appropriately.        Comments   Pt here for 4 mo ret heme OS. Pt states VA is the same no change.       Last edited by , , MD on 03/10/2022  1:09 PM.    Pt states vision is stable, no new health concerns   Referring physician: Tysinger, David S, PA-C [address] [place],  [place] [address]  HISTORICAL INFORMATION:   Selected notes from the medical record:  Referred by Dr. Bowen for retinal hemorrhages OS LEE:  Ocular Hx- PMH-    CURRENT MEDICATIONS: Current Outpatient Medications (Ophthalmic Drugs)  Medication Sig   azelastine (OPTIVAR) 0.05 % ophthalmic solution Instill 1 drop into the affected eye(s) twice daily as directed   No current facility-administered medications for this visit. (Ophthalmic Drugs)   Current Outpatient Medications (Other)  Medication Sig   albuterol (VENTOLIN HFA) 108 (90 Base) MCG/ACT inhaler Inhale 1-2 puffs into the lungs every 4-6 hours as needed for cough or wheeze   amoxicillin-clavulanate (AUGMENTIN) 875-125 MG tablet Take 1 tablet by mouth 2 (two) times daily.   Azelastine HCl 137 MCG/SPRAY SOLN INSTILL 1 TO 2 SPRAYS INTO EACH NOTRIL TWICE DAILY   Blood Pressure Monitoring (OMRON 3 SERIES BP MONITOR) DEVI Use as directed   EPINEPHrine 0.3 mg/0.3 mL IJ SOAJ injection Inject as directed into the muscle as needed for severe allergic reaction   Ergocalciferol (VITAMIN D2) 50 MCG (2000 UT) TABS Take 1 tablet by mouth daily.   estradiol (VIVELLE-DOT) 0.1 MG/24HR  patch APPLY 1 PATCH ONTO SKIN 2 TIMES A WEEK   fluticasone (FLONASE) 50 MCG/ACT nasal spray Use 1-2 sprays in each nostril once to twice daily as directed   hydrocortisone 2.5 % cream Apply to affected area 2 (two) times daily.   levocetirizine (XYZAL) 5 MG tablet Take 1 tablet by mouth once daily in the evening   montelukast (SINGULAIR) 10 MG tablet Take 1 tablet by mouth every evening   ondansetron (ZOFRAN) 4 MG tablet Take 1 tablet by mouth every 8 hours as needed for nausea or vomiting.   pantoprazole (PROTONIX) 40 MG tablet TAKE 1 TABLET BY MOUTH DAILY   Probiotic Product (RESTORA) CAPS Take 1 capsule by mouth daily.   rosuvastatin (CRESTOR) 10 MG tablet Take 1 tablet (10 mg total) by mouth daily.   Semaglutide-Weight Management (WEGOVY) 1.7 MG/0.75ML SOAJ Inject 1 pen (1.7 mg ) into the skin once a week.   sertraline (ZOLOFT) 100 MG tablet TAKE 1 TABLET BY MOUTH DAILY.   traZODone (DESYREL) 50 MG tablet Take 1/2-1 tablet (25-50 mg total) by mouth at bedtime as needed for sleep.   No current facility-administered medications for this visit. (Other)   REVIEW OF SYSTEMS: ROS   Positive for: Cardiovascular, Eyes Negative for: Constitutional, Gastrointestinal, Neurological, Skin, Genitourinary, Musculoskeletal, HENT, Endocrine, Respiratory, Psychiatric, Allergic/Imm, Heme/Lymph Last edited by Simpson, Makenzie E, COT on 03/10/2022  9:39 AM.        ALLERGIES Allergies  Allergen Reactions   Sulfa Antibiotics Hives   Ambien [Zolpidem]     Nightmares/vivid dreams   Percocet [Oxycodone-Acetaminophen] Itching    Oxycodone " makes me climb the wall, itching"   PAST MEDICAL HISTORY Past Medical History:  Diagnosis Date   Adenocarcinoma in situ of cervix    Allergy    sees Dr. Sharma   Anemia    hx/o iron deficiency due to heavy periods   Asthma    exercise induced   Bursitis    Cancer (HCC)    cervical   COVID-19 05/26/2019   fatigue, h/a, loss of taste and smell, fatige x 1  month, loss of taste x 5 months all symptoms rsolved   DDD (degenerative disc disease), lumbar    Fatty liver    GERD (gastroesophageal reflux disease)    Dr. Magod   H/O mammogram    Hiatal hernia    small per EGD, Dr. Magod   Hyperlipidemia 2012   was on pravastatin prior   Migraine    prior eval with neurologist, Dr. Lewitt   Overactive bladder    PMDD (premenstrual dysphoric disorder)    Pyelonephritis    age 20yo   Uterine infection 2015   Vitamin D deficiency    Wears glasses    Past Surgical History:  Procedure Laterality Date   CERVICAL CONIZATION W/BX N/A 03/05/2020   Procedure: CONIZATION CERVIX WITH BIOPSY;  Surgeon: Richardson, Kathy, MD;  Location: [facility];  Service: Gynecology;  Laterality: N/A;   CHOLECYSTECTOMY  2011   lapa   COLONOSCOPY  2014   repeat 5 years; Dr. Magod, baseline   CYSTOSCOPY N/A 03/20/2020   Procedure: CYSTOSCOPY;  Surgeon: Richardson, Kathy, MD;  Location: [facility];  Service: Gynecology;  Laterality: N/A;   ESOPHAGOGASTRODUODENOSCOPY  2014   Dr. Magod   FOOT SURGERY Left 2011   morton's neuroma   LAPAROSCOPIC VAGINAL HYSTERECTOMY WITH SALPINGECTOMY Bilateral 03/20/2020   Procedure: LAPAROSCOPIC ASSISTED VAGINAL HYSTERECTOMY WITH SALPINGECTOMY;  Surgeon: Richardson, Kathy, MD;  Location: [facility];  Service: Gynecology;  Laterality: Bilateral;   LASIK  yrs ago   Dr. Stonecifer   LUMBAR DISC SURGERY  07/2013, [facility]   due to herniated disc, Dr. Dumonski   NOVASURE ABLATION  2015   WISDOM TOOTH EXTRACTION  age 20   FAMILY HISTORY Family History  Problem Relation Age of Onset   Cataracts Mother    Lupus Mother    Hypertension Mother    Hyperlipidemia Mother    Migraines Mother    Kidney disease Mother    Hypertension Father    Diabetes Father    Hyperlipidemia Father    Depression Father    Anxiety disorder Father    Dementia Father    Obesity Sister     Hypertension Sister    Diabetes Sister    Ovarian cysts Daughter    Heart disease Paternal Uncle        CABG   Hyperlipidemia Paternal Uncle    Cancer Paternal Uncle        multiple with colon cancer   Aplastic anemia Maternal Grandfather    AAA (abdominal aortic aneurysm) Maternal Grandfather    Cancer Cousin        breast   Cancer Cousin        ovarian   Stroke Neg Hx    SOCIAL HISTORY Social History   Tobacco Use   Smoking status: Never     Smokeless tobacco: Never  Vaping Use   Vaping Use: Never used  Substance Use Topics   Alcohol use: Yes    Alcohol/week: 3.0 standard drinks of alcohol    Types: 3 Cans of beer per week    Comment: socially   Drug use: No       OPHTHALMIC EXAM:  Base Eye Exam     Visual Acuity (Snellen - Linear)       Right Left   Dist [place] 20/20 20/20         Tonometry (Tonopen, 9:42 AM)       Right Left   Pressure 14 14         Pupils       Pupils Dark Light Shape React APD   Right PERRL 3 2 Round Brisk None   Left PERRL 3 2 Round Brisk None         Visual Fields (Counting fingers)       Left Right    Full Full         Extraocular Movement       Right Left    Full, Ortho Full, Ortho         Neuro/Psych     Oriented x3: Yes   Mood/Affect: Normal         Dilation     Both eyes: 1.0% Mydriacyl, 2.5% Phenylephrine @ 9:42 AM           Slit Lamp and Fundus Exam     Slit Lamp Exam       Right Left   Lids/Lashes Normal Normal   Conjunctiva/Sclera White and quiet White and quiet   Cornea trace PEE, well healed lasik flap, decreased TBUT well healed lasik flap   Anterior Chamber deep and clear deep and clear   Iris Round and dilated Round and dilated   Lens Clear Clear   Anterior Vitreous mild syneresis mild syneresis         Fundus Exam       Right Left   Disc Pink and Sharp Pink and Sharp   C/D Ratio 0.3 0.4   Macula Flat, Good foveal reflex, No heme or edema Flat, Good foveal reflex, RPE  mottling, No heme or edema   Vessels mild attenuation, mild copper wiring, mild tortuosity mild attenuation, mild copper wiring, mild tortuosity, mild AV crossing changes, Telangiectasia off ST venules in superior quad   Periphery Attached, No heme Attached, attenuated and tortuous venules / +telangectasia just outside ST arcades with surrounding MA/DBH, scattered DBH superiorly           IMAGING AND PROCEDURES  Imaging and Procedures for 03/10/2022  OCT, Retina - OU - Both Eyes       Right Eye Quality was good. Central Foveal Thickness: 277. Progression has been stable. Findings include normal foveal contour, no IRF, no SRF, vitreomacular adhesion .   Left Eye Quality was good. Central Foveal Thickness: 273. Progression has been stable. Findings include normal foveal contour, no IRF, no SRF, vitreomacular adhesion .   Notes *Images captured and stored on drive  Diagnosis / Impression:  NFP, no IRF/SRF OU  Clinical management:  See below  Abbreviations: NFP - Normal foveal profile. CME - cystoid macular edema. PED - pigment epithelial detachment. IRF - intraretinal fluid. SRF - subretinal fluid. EZ - ellipsoid zone. ERM - epiretinal membrane. ORA - outer retinal atrophy. ORT - outer retinal tubulation. SRHM - subretinal hyper-reflective material. IRHM - intraretinal hyper-reflective material  Color Fundus Photography Optos - OU - Both Eyes       Right Eye Progression has been stable. Disc findings include normal observations. Macula : normal observations. Vessels : normal observations. Periphery : normal observations.   Left Eye Progression has been stable. Disc findings include normal observations. Macula : normal observations. Vessels : tortuous vessels (Focal attenuation, tortuosity and telangectasias superior midzone just outside ST arcades). Periphery : hemorrhage.   Notes **Images stored on drive**  Impression: OS:   Focal cluster of MA DBH + vascular  attenuation, tortuosity and telangectasias superior midzone just outside ST arcades           ASSESSMENT/PLAN:    ICD-10-CM   1. Retinal hemorrhage of left eye  H35.62 OCT, Retina - OU - Both Eyes    Color Fundus Photography Optos - OU - Both Eyes    2. Hypertensive retinopathy of both eyes  H35.033     3. Hx of LASIK  Z98.890      1. Retinal hemorrhages OS  - mild MA DBH superior periphery -- persistent - incidental finding on dilated exam (4.14.23) w/ Dr. Valetta Close  - today, hemes persist, but BCVA 20/20 OU and pt asymptomatic  - exam shows attenuated and tortuous venules just outside ST arcades amidst the retinal hemorrhages -- appearance suggestive of mild remote BRVO  - pt denies history of HTN, DM2, blood clots  - is on [facility] for weight loss and estradiol patch for hormone replacement post hysterectomy (hx of cervical ca)  - OCT w/o retinal edema  - FA 4.18.23 without leakage or vasculitis  - Optos color and FAF images obtained today 10.10.23  - no retinal or ophthalmic interventions indicated or recommended  - monitor  - f/u in 9 months, sooner prn -- DFE/OCT  2. Hypertensive retinopathy OU  - pt does not carry formal diagnosis of HTN, and is not on any BP meds - discussed importance of tight BP control - recommend home monitoring of BP w/ home cuff - continue to monitor  3. History of LASIK OU  - stable  Ophthalmic Meds Ordered this visit:  No orders of the defined types were placed in this encounter.    Return in about 9 months (around 12/09/2022) for f/u retinal hemes OS, DFE, OCT.  There are no Patient Instructions on file for this visit.   Explained the diagnoses, plan, and follow up with the patient and they expressed understanding.  Patient expressed understanding of the importance of proper follow up care.   This document serves as a record of services personally performed by Gardiner Sleeper, MD, PhD. It was created on their behalf by Renaldo Reel,  [place] an ophthalmic technician. The creation of this record is the provider's dictation and/or activities during the visit.    Electronically signed by:  Renaldo Reel, COT  02/24/22 1:13 PM  This document serves as a record of services personally performed by Gardiner Sleeper, MD, PhD. It was created on their behalf by San Jetty. Owens Shark, OA an ophthalmic technician. The creation of this record is the provider's dictation and/or activities during the visit.    Electronically signed by: San Jetty. [place], [place] 10.10.2023 1:13 PM  Gardiner Sleeper, M.D., Ph.D. Diseases & Surgery of the Retina and Vitreous [facility]  I have reviewed the above documentation for accuracy and completeness, and I agree with the above. Gardiner Sleeper, M.D., Ph.D. 03/10/22 1:13 PM  Abbreviations: Jerilynn Mages myopia (  nearsighted); A astigmatism; H hyperopia (farsighted); P presbyopia; Mrx spectacle prescription;  CTL contact lenses; OD right eye; OS left eye; OU both eyes  XT exotropia; ET esotropia; PEK punctate epithelial keratitis; PEE punctate epithelial erosions; DES dry eye syndrome; MGD meibomian gland dysfunction; ATs artificial tears; PFAT's preservative free artificial tears; NSC nuclear sclerotic cataract; PSC posterior subcapsular cataract; ERM epi-retinal membrane; PVD posterior vitreous detachment; RD retinal detachment; DM diabetes mellitus; DR diabetic retinopathy; NPDR non-proliferative diabetic retinopathy; PDR proliferative diabetic retinopathy; CSME clinically significant macular edema; DME diabetic macular edema; dbh dot blot hemorrhages; CWS cotton wool spot; POAG primary open angle glaucoma; C/D cup-to-disc ratio; HVF humphrey visual field; GVF goldmann visual field; OCT optical coherence tomography; IOP intraocular pressure; BRVO Branch retinal vein occlusion; CRVO central retinal vein occlusion; CRAO central retinal artery occlusion; BRAO branch retinal artery occlusion; RT retinal tear; SB  scleral buckle; PPV pars plana vitrectomy; VH Vitreous hemorrhage; PRP panretinal laser photocoagulation; IVK intravitreal kenalog; VMT vitreomacular traction; MH Macular hole;  NVD neovascularization of the disc; NVE neovascularization elsewhere; AREDS age related eye disease study; ARMD age related macular degeneration; POAG primary open angle glaucoma; EBMD epithelial/anterior basement membrane dystrophy; ACIOL anterior chamber intraocular lens; IOL intraocular lens; PCIOL posterior chamber intraocular lens; Phaco/IOL phacoemulsification with intraocular lens placement; PRK photorefractive keratectomy; LASIK laser assisted in situ keratomileusis; HTN hypertension; DM diabetes mellitus; COPD chronic obstructive pulmonary disease 

## 2022-02-27 ENCOUNTER — Other Ambulatory Visit (HOSPITAL_COMMUNITY): Payer: Self-pay

## 2022-03-02 DIAGNOSIS — J301 Allergic rhinitis due to pollen: Secondary | ICD-10-CM | POA: Diagnosis not present

## 2022-03-02 DIAGNOSIS — K625 Hemorrhage of anus and rectum: Secondary | ICD-10-CM | POA: Diagnosis not present

## 2022-03-02 DIAGNOSIS — J3089 Other allergic rhinitis: Secondary | ICD-10-CM | POA: Diagnosis not present

## 2022-03-02 DIAGNOSIS — K5901 Slow transit constipation: Secondary | ICD-10-CM | POA: Diagnosis not present

## 2022-03-02 DIAGNOSIS — J3081 Allergic rhinitis due to animal (cat) (dog) hair and dander: Secondary | ICD-10-CM | POA: Diagnosis not present

## 2022-03-10 ENCOUNTER — Encounter: Payer: Self-pay | Admitting: Internal Medicine

## 2022-03-10 ENCOUNTER — Encounter (INDEPENDENT_AMBULATORY_CARE_PROVIDER_SITE_OTHER): Payer: Self-pay | Admitting: Ophthalmology

## 2022-03-10 ENCOUNTER — Ambulatory Visit (INDEPENDENT_AMBULATORY_CARE_PROVIDER_SITE_OTHER): Payer: 59 | Admitting: Ophthalmology

## 2022-03-10 DIAGNOSIS — H3562 Retinal hemorrhage, left eye: Secondary | ICD-10-CM

## 2022-03-10 DIAGNOSIS — Z9889 Other specified postprocedural states: Secondary | ICD-10-CM | POA: Diagnosis not present

## 2022-03-10 DIAGNOSIS — H35033 Hypertensive retinopathy, bilateral: Secondary | ICD-10-CM | POA: Diagnosis not present

## 2022-03-11 DIAGNOSIS — J3089 Other allergic rhinitis: Secondary | ICD-10-CM | POA: Diagnosis not present

## 2022-03-11 DIAGNOSIS — J301 Allergic rhinitis due to pollen: Secondary | ICD-10-CM | POA: Diagnosis not present

## 2022-03-11 DIAGNOSIS — J3081 Allergic rhinitis due to animal (cat) (dog) hair and dander: Secondary | ICD-10-CM | POA: Diagnosis not present

## 2022-03-18 ENCOUNTER — Other Ambulatory Visit (HOSPITAL_COMMUNITY): Payer: Self-pay

## 2022-03-23 DIAGNOSIS — J3081 Allergic rhinitis due to animal (cat) (dog) hair and dander: Secondary | ICD-10-CM | POA: Diagnosis not present

## 2022-03-23 DIAGNOSIS — J301 Allergic rhinitis due to pollen: Secondary | ICD-10-CM | POA: Diagnosis not present

## 2022-03-23 DIAGNOSIS — J3089 Other allergic rhinitis: Secondary | ICD-10-CM | POA: Diagnosis not present

## 2022-03-25 ENCOUNTER — Other Ambulatory Visit (HOSPITAL_COMMUNITY): Payer: Self-pay

## 2022-04-02 ENCOUNTER — Encounter (HOSPITAL_COMMUNITY): Payer: Self-pay

## 2022-04-02 ENCOUNTER — Other Ambulatory Visit: Payer: Self-pay | Admitting: Medical

## 2022-04-02 ENCOUNTER — Other Ambulatory Visit (HOSPITAL_COMMUNITY): Payer: Self-pay

## 2022-04-02 DIAGNOSIS — J3089 Other allergic rhinitis: Secondary | ICD-10-CM | POA: Diagnosis not present

## 2022-04-02 DIAGNOSIS — J301 Allergic rhinitis due to pollen: Secondary | ICD-10-CM | POA: Diagnosis not present

## 2022-04-02 DIAGNOSIS — J3081 Allergic rhinitis due to animal (cat) (dog) hair and dander: Secondary | ICD-10-CM | POA: Diagnosis not present

## 2022-04-02 MED ORDER — WEGOVY 1.7 MG/0.75ML ~~LOC~~ SOAJ
1.7000 mg | SUBCUTANEOUS | 1 refills | Status: DC
  Filled 2022-04-02: qty 3, 28d supply, fill #0
  Filled 2022-05-08: qty 3, 28d supply, fill #1

## 2022-04-13 ENCOUNTER — Other Ambulatory Visit (HOSPITAL_COMMUNITY): Payer: Self-pay

## 2022-04-13 DIAGNOSIS — J3089 Other allergic rhinitis: Secondary | ICD-10-CM | POA: Diagnosis not present

## 2022-04-13 DIAGNOSIS — J301 Allergic rhinitis due to pollen: Secondary | ICD-10-CM | POA: Diagnosis not present

## 2022-04-13 DIAGNOSIS — J3081 Allergic rhinitis due to animal (cat) (dog) hair and dander: Secondary | ICD-10-CM | POA: Diagnosis not present

## 2022-04-30 ENCOUNTER — Other Ambulatory Visit (HOSPITAL_COMMUNITY): Payer: Self-pay

## 2022-04-30 ENCOUNTER — Other Ambulatory Visit: Payer: Self-pay | Admitting: Medical

## 2022-04-30 DIAGNOSIS — J301 Allergic rhinitis due to pollen: Secondary | ICD-10-CM | POA: Diagnosis not present

## 2022-04-30 DIAGNOSIS — J3081 Allergic rhinitis due to animal (cat) (dog) hair and dander: Secondary | ICD-10-CM | POA: Diagnosis not present

## 2022-04-30 DIAGNOSIS — J3089 Other allergic rhinitis: Secondary | ICD-10-CM | POA: Diagnosis not present

## 2022-04-30 MED ORDER — ROSUVASTATIN CALCIUM 10 MG PO TABS
10.0000 mg | ORAL_TABLET | Freq: Every day | ORAL | 1 refills | Status: DC
  Filled 2022-04-30: qty 90, 90d supply, fill #0

## 2022-04-30 MED ORDER — ONDANSETRON HCL 4 MG PO TABS
4.0000 mg | ORAL_TABLET | Freq: Three times a day (TID) | ORAL | 0 refills | Status: DC | PRN
  Filled 2022-04-30: qty 90, 30d supply, fill #0

## 2022-05-01 ENCOUNTER — Other Ambulatory Visit (HOSPITAL_COMMUNITY): Payer: Self-pay

## 2022-05-07 DIAGNOSIS — J3081 Allergic rhinitis due to animal (cat) (dog) hair and dander: Secondary | ICD-10-CM | POA: Diagnosis not present

## 2022-05-07 DIAGNOSIS — J301 Allergic rhinitis due to pollen: Secondary | ICD-10-CM | POA: Diagnosis not present

## 2022-05-07 DIAGNOSIS — J3089 Other allergic rhinitis: Secondary | ICD-10-CM | POA: Diagnosis not present

## 2022-05-08 ENCOUNTER — Other Ambulatory Visit (HOSPITAL_COMMUNITY): Payer: Self-pay

## 2022-05-13 DIAGNOSIS — J3081 Allergic rhinitis due to animal (cat) (dog) hair and dander: Secondary | ICD-10-CM | POA: Diagnosis not present

## 2022-05-13 DIAGNOSIS — J301 Allergic rhinitis due to pollen: Secondary | ICD-10-CM | POA: Diagnosis not present

## 2022-05-13 DIAGNOSIS — J3089 Other allergic rhinitis: Secondary | ICD-10-CM | POA: Diagnosis not present

## 2022-05-27 DIAGNOSIS — J301 Allergic rhinitis due to pollen: Secondary | ICD-10-CM | POA: Diagnosis not present

## 2022-05-27 DIAGNOSIS — J3089 Other allergic rhinitis: Secondary | ICD-10-CM | POA: Diagnosis not present

## 2022-05-27 DIAGNOSIS — J3081 Allergic rhinitis due to animal (cat) (dog) hair and dander: Secondary | ICD-10-CM | POA: Diagnosis not present

## 2022-06-02 ENCOUNTER — Other Ambulatory Visit: Payer: Self-pay | Admitting: Medical

## 2022-06-02 ENCOUNTER — Other Ambulatory Visit (HOSPITAL_COMMUNITY): Payer: Self-pay

## 2022-06-02 NOTE — Telephone Encounter (Signed)
Refill request last apt 01/20/22 next apt 01/22/23.

## 2022-06-03 ENCOUNTER — Other Ambulatory Visit: Payer: Self-pay

## 2022-06-03 ENCOUNTER — Other Ambulatory Visit (HOSPITAL_COMMUNITY): Payer: Self-pay

## 2022-06-03 MED ORDER — SERTRALINE HCL 100 MG PO TABS
100.0000 mg | ORAL_TABLET | Freq: Every day | ORAL | 0 refills | Status: DC
  Filled 2022-06-03: qty 90, 90d supply, fill #0

## 2022-06-03 NOTE — Telephone Encounter (Signed)
Schedule med check appt now.  Refill sent

## 2022-06-08 ENCOUNTER — Other Ambulatory Visit (HOSPITAL_COMMUNITY): Payer: Self-pay

## 2022-06-15 DIAGNOSIS — J3081 Allergic rhinitis due to animal (cat) (dog) hair and dander: Secondary | ICD-10-CM | POA: Diagnosis not present

## 2022-06-15 DIAGNOSIS — J3089 Other allergic rhinitis: Secondary | ICD-10-CM | POA: Diagnosis not present

## 2022-06-15 DIAGNOSIS — J301 Allergic rhinitis due to pollen: Secondary | ICD-10-CM | POA: Diagnosis not present

## 2022-06-17 ENCOUNTER — Other Ambulatory Visit: Payer: Self-pay | Admitting: Medical

## 2022-06-17 ENCOUNTER — Other Ambulatory Visit: Payer: Self-pay

## 2022-06-17 MED ORDER — WEGOVY 1.7 MG/0.75ML ~~LOC~~ SOAJ
1.7000 mg | SUBCUTANEOUS | 1 refills | Status: DC
  Filled 2022-06-17 – 2022-07-23 (×5): qty 3, 28d supply, fill #0

## 2022-06-18 ENCOUNTER — Other Ambulatory Visit (HOSPITAL_COMMUNITY): Payer: Self-pay

## 2022-06-19 ENCOUNTER — Other Ambulatory Visit: Payer: Self-pay | Admitting: Obstetrics and Gynecology

## 2022-06-19 DIAGNOSIS — Z1231 Encounter for screening mammogram for malignant neoplasm of breast: Secondary | ICD-10-CM

## 2022-06-22 ENCOUNTER — Other Ambulatory Visit: Payer: Self-pay

## 2022-06-22 ENCOUNTER — Other Ambulatory Visit (HOSPITAL_COMMUNITY): Payer: Self-pay

## 2022-06-23 ENCOUNTER — Other Ambulatory Visit: Payer: Self-pay

## 2022-06-23 ENCOUNTER — Other Ambulatory Visit (HOSPITAL_COMMUNITY): Payer: Self-pay

## 2022-06-24 DIAGNOSIS — J3081 Allergic rhinitis due to animal (cat) (dog) hair and dander: Secondary | ICD-10-CM | POA: Diagnosis not present

## 2022-06-24 DIAGNOSIS — J3089 Other allergic rhinitis: Secondary | ICD-10-CM | POA: Diagnosis not present

## 2022-06-24 DIAGNOSIS — J301 Allergic rhinitis due to pollen: Secondary | ICD-10-CM | POA: Diagnosis not present

## 2022-06-26 ENCOUNTER — Other Ambulatory Visit: Payer: Self-pay

## 2022-06-26 ENCOUNTER — Other Ambulatory Visit (HOSPITAL_COMMUNITY): Payer: Self-pay

## 2022-07-02 ENCOUNTER — Other Ambulatory Visit (HOSPITAL_COMMUNITY): Payer: Self-pay

## 2022-07-02 DIAGNOSIS — H5711 Ocular pain, right eye: Secondary | ICD-10-CM | POA: Diagnosis not present

## 2022-07-02 DIAGNOSIS — J3089 Other allergic rhinitis: Secondary | ICD-10-CM | POA: Diagnosis not present

## 2022-07-02 DIAGNOSIS — H2000 Unspecified acute and subacute iridocyclitis: Secondary | ICD-10-CM | POA: Diagnosis not present

## 2022-07-02 DIAGNOSIS — J301 Allergic rhinitis due to pollen: Secondary | ICD-10-CM | POA: Diagnosis not present

## 2022-07-02 DIAGNOSIS — J3081 Allergic rhinitis due to animal (cat) (dog) hair and dander: Secondary | ICD-10-CM | POA: Diagnosis not present

## 2022-07-02 MED ORDER — PREDNISOLONE ACETATE 1 % OP SUSP
1.0000 [drp] | OPHTHALMIC | 0 refills | Status: DC
  Filled 2022-07-02: qty 10, 7d supply, fill #0

## 2022-07-03 ENCOUNTER — Other Ambulatory Visit (HOSPITAL_COMMUNITY): Payer: Self-pay

## 2022-07-06 ENCOUNTER — Ambulatory Visit: Payer: 59 | Admitting: Medical

## 2022-07-06 VITALS — BP 110/78 | HR 84 | Temp 98.4°F | Wt 188.2 lb

## 2022-07-06 DIAGNOSIS — H2000 Unspecified acute and subacute iridocyclitis: Secondary | ICD-10-CM | POA: Diagnosis not present

## 2022-07-06 DIAGNOSIS — N644 Mastodynia: Secondary | ICD-10-CM | POA: Diagnosis not present

## 2022-07-06 DIAGNOSIS — H5711 Ocular pain, right eye: Secondary | ICD-10-CM | POA: Diagnosis not present

## 2022-07-06 MED ORDER — AMOXICILLIN-POT CLAVULANATE 875-125 MG PO TABS: 1.0000 | ORAL_TABLET | Freq: Two times a day (BID) | ORAL | 0 refills | Status: DC

## 2022-07-06 NOTE — Progress Notes (Signed)
Subjective:  Michelle Griffin is a 52 y.o. female who presents for Chief Complaint  Patient presents with   right breast pain    Right breast pain at the nipple. Bump at the nipple and red/purple- pain x 4 days. Getting in the shower possiblity the door hit her over a week ago.      Here for right breast pain at nipple.  Has a bump at the nipple that is turning colors, discomfort keeping her up at night.   Thinks she had bumped her breast in the shower last week but no other reported trauma.   No fever, no body aches or chills.   Using no warm compresses.  No nipple discharge.   Is being treated for iritis by eye doctor.  No other aggravating or relieving factors.    No other c/o.  The following portions of the patient's history were reviewed and updated as appropriate: allergies, current medications, past family history, past medical history, past social history, past surgical history and problem list.  ROS Otherwise as in subjective above  Objective: BP 110/78   Pulse 84   Temp 98.4 F (36.9 C)   Wt 188 lb 3.2 oz (85.4 kg)   LMP 03/16/2015   BMI 27.00 kg/m   Wt Readings from Last 3 Encounters:  07/06/22 188 lb 3.2 oz (85.4 kg)  01/20/22 202 lb 3.2 oz (91.7 kg)  08/28/21 207 lb 9.6 oz (94.2 kg)    General appearance: alert, no distress, well developed, well nourished HEENT: normocephalic, sclerae anicteric, conjunctiva pink and moist, TMs pearly, nares patent, no discharge or erythema, pharynx breast:  right nipple with pink irritated area and slight puffiness, but no surrounding induration, erythema or fluctuance.  Breast in general close to nipple is tender.  No other mass, no lymphadenopathy. Rest of breast exam unremarkable.   Exam chaperoned by nurse   Assessment: Encounter Diagnoses  Name Primary?   Nipple pain Yes   Nipple tenderness      Plan: There is localized tenderness and redness right at the nipple today but no other induration or surrounding erythema, no  discharge or drainage, no fever or chills.  Begin topical triple antibiotic ointment over-the-counter, warm compresses, several days of ibuprofen to help with pain and inflammation.  Begin antibiotic blood to be proactive in the event of early infection.  Advised if any worse symptoms in the next few days such as induration, warmth, worse redness surrounding the areola, fever or other symptoms then get reevaluated right away  [place] was seen today for right breast pain.  Diagnoses and all orders for this visit:  Nipple pain  Nipple tenderness  Other orders -     amoxicillin-clavulanate (AUGMENTIN) 875-125 MG tablet; Take 1 tablet by mouth 2 (two) times daily.    Follow up: prn

## 2022-07-14 ENCOUNTER — Other Ambulatory Visit (HOSPITAL_COMMUNITY): Payer: Self-pay

## 2022-07-15 ENCOUNTER — Other Ambulatory Visit: Payer: Self-pay

## 2022-07-15 ENCOUNTER — Other Ambulatory Visit (HOSPITAL_COMMUNITY): Payer: Self-pay

## 2022-07-16 ENCOUNTER — Encounter (HOSPITAL_COMMUNITY): Payer: Self-pay | Admitting: Pharmacist

## 2022-07-16 ENCOUNTER — Other Ambulatory Visit (HOSPITAL_COMMUNITY): Payer: Self-pay

## 2022-07-17 DIAGNOSIS — M79672 Pain in left foot: Secondary | ICD-10-CM | POA: Diagnosis not present

## 2022-07-17 DIAGNOSIS — J3089 Other allergic rhinitis: Secondary | ICD-10-CM | POA: Diagnosis not present

## 2022-07-17 DIAGNOSIS — J3081 Allergic rhinitis due to animal (cat) (dog) hair and dander: Secondary | ICD-10-CM | POA: Diagnosis not present

## 2022-07-17 DIAGNOSIS — J301 Allergic rhinitis due to pollen: Secondary | ICD-10-CM | POA: Diagnosis not present

## 2022-07-20 ENCOUNTER — Other Ambulatory Visit: Payer: Self-pay

## 2022-07-21 DIAGNOSIS — G5601 Carpal tunnel syndrome, right upper limb: Secondary | ICD-10-CM | POA: Diagnosis not present

## 2022-07-21 DIAGNOSIS — G5602 Carpal tunnel syndrome, left upper limb: Secondary | ICD-10-CM | POA: Diagnosis not present

## 2022-07-21 DIAGNOSIS — G5603 Carpal tunnel syndrome, bilateral upper limbs: Secondary | ICD-10-CM | POA: Diagnosis not present

## 2022-07-23 ENCOUNTER — Other Ambulatory Visit: Payer: Self-pay

## 2022-07-23 ENCOUNTER — Other Ambulatory Visit (HOSPITAL_COMMUNITY): Payer: Self-pay

## 2022-07-23 DIAGNOSIS — J301 Allergic rhinitis due to pollen: Secondary | ICD-10-CM | POA: Diagnosis not present

## 2022-07-23 DIAGNOSIS — J3089 Other allergic rhinitis: Secondary | ICD-10-CM | POA: Diagnosis not present

## 2022-07-23 DIAGNOSIS — J3081 Allergic rhinitis due to animal (cat) (dog) hair and dander: Secondary | ICD-10-CM | POA: Diagnosis not present

## 2022-07-23 MED ORDER — AZELASTINE HCL 137 MCG/SPRAY NA SOLN
1.0000 | Freq: Two times a day (BID) | NASAL | 3 refills | Status: AC
  Filled 2022-07-23: qty 90, 75d supply, fill #0
  Filled 2022-10-02: qty 90, 75d supply, fill #1
  Filled 2023-03-18: qty 90, 75d supply, fill #2
  Filled 2023-06-01: qty 90, 75d supply, fill #3

## 2022-07-27 ENCOUNTER — Other Ambulatory Visit: Payer: Self-pay | Admitting: Medical

## 2022-07-27 DIAGNOSIS — N644 Mastodynia: Secondary | ICD-10-CM

## 2022-07-27 DIAGNOSIS — Z1231 Encounter for screening mammogram for malignant neoplasm of breast: Secondary | ICD-10-CM

## 2022-07-29 DIAGNOSIS — J3089 Other allergic rhinitis: Secondary | ICD-10-CM | POA: Diagnosis not present

## 2022-07-29 DIAGNOSIS — J301 Allergic rhinitis due to pollen: Secondary | ICD-10-CM | POA: Diagnosis not present

## 2022-07-29 DIAGNOSIS — J3081 Allergic rhinitis due to animal (cat) (dog) hair and dander: Secondary | ICD-10-CM | POA: Diagnosis not present

## 2022-07-30 ENCOUNTER — Ambulatory Visit
Admission: RE | Admit: 2022-07-30 | Discharge: 2022-07-30 | Disposition: A | Discharge status: Home / Self Care | Payer: 59 | Source: Ambulatory Visit | Attending: Medical | Admitting: Medical

## 2022-07-30 DIAGNOSIS — Z1231 Encounter for screening mammogram for malignant neoplasm of breast: Secondary | ICD-10-CM

## 2022-07-30 DIAGNOSIS — N644 Mastodynia: Secondary | ICD-10-CM

## 2022-07-30 DIAGNOSIS — R922 Inconclusive mammogram: Secondary | ICD-10-CM | POA: Diagnosis not present

## 2022-07-31 NOTE — Progress Notes (Signed)
Results sent through MyChart

## 2022-08-04 ENCOUNTER — Encounter (HOSPITAL_COMMUNITY): Payer: Self-pay

## 2022-08-04 ENCOUNTER — Other Ambulatory Visit: Payer: Self-pay

## 2022-08-04 ENCOUNTER — Emergency Department (HOSPITAL_COMMUNITY)
Admission: EM | Admit: 2022-08-04 | Discharge: 2022-08-05 | Disposition: A | Discharge status: Home / Self Care | Payer: 59 | Attending: Emergency Medicine | Admitting: Emergency Medicine

## 2022-08-04 ENCOUNTER — Emergency Department (HOSPITAL_COMMUNITY): Payer: 59

## 2022-08-04 DIAGNOSIS — R1084 Generalized abdominal pain: Secondary | ICD-10-CM | POA: Diagnosis not present

## 2022-08-04 DIAGNOSIS — K5900 Constipation, unspecified: Secondary | ICD-10-CM | POA: Insufficient documentation

## 2022-08-04 DIAGNOSIS — R109 Unspecified abdominal pain: Secondary | ICD-10-CM | POA: Diagnosis not present

## 2022-08-04 LAB — CBC WITH DIFFERENTIAL/PLATELET
Abs Immature Granulocytes: 0.06 10*3/uL (ref 0.00–0.07)
Basophils Absolute: 0 10*3/uL (ref 0.0–0.1)
Basophils Relative: 0 %
Eosinophils Absolute: 0 10*3/uL (ref 0.0–0.5)
Eosinophils Relative: 0 %
HCT: 34.2 % — ABNORMAL LOW (ref 36.0–46.0)
Hemoglobin: 11 g/dL — ABNORMAL LOW (ref 12.0–15.0)
Immature Granulocytes: 1 %
Lymphocytes Relative: 7 %
Lymphs Abs: 0.9 10*3/uL (ref 0.7–4.0)
MCH: 25 pg — ABNORMAL LOW (ref 26.0–34.0)
MCHC: 32.2 g/dL (ref 30.0–36.0)
MCV: 77.7 fL — ABNORMAL LOW (ref 80.0–100.0)
Monocytes Absolute: 0.7 10*3/uL (ref 0.1–1.0)
Monocytes Relative: 6 %
Neutro Abs: 10.8 10*3/uL — ABNORMAL HIGH (ref 1.7–7.7)
Neutrophils Relative %: 86 %
Platelets: 249 10*3/uL (ref 150–400)
RBC: 4.4 MIL/uL (ref 3.87–5.11)
RDW: 14.1 % (ref 11.5–15.5)
WBC: 12.5 10*3/uL — ABNORMAL HIGH (ref 4.0–10.5)
nRBC: 0 % (ref 0.0–0.2)

## 2022-08-04 LAB — COMPREHENSIVE METABOLIC PANEL
ALT: 19 U/L (ref 0–44)
AST: 27 U/L (ref 15–41)
Albumin: 4.1 g/dL (ref 3.5–5.0)
Alkaline Phosphatase: 65 U/L (ref 38–126)
Anion gap: 8 (ref 5–15)
BUN: 11 mg/dL (ref 6–20)
CO2: 21 mmol/L — ABNORMAL LOW (ref 22–32)
Calcium: 9 mg/dL (ref 8.9–10.3)
Chloride: 106 mmol/L (ref 98–111)
Creatinine, Ser: 0.87 mg/dL (ref 0.44–1.00)
GFR, Estimated: >60 mL/min (ref >60)
Glucose, Bld: 106 mg/dL — ABNORMAL HIGH (ref 70–99)
Potassium: 3.2 mmol/L — ABNORMAL LOW (ref 3.5–5.1)
Sodium: 135 mmol/L (ref 135–145)
Total Bilirubin: 0.8 mg/dL (ref 0.3–1.2)
Total Protein: 7.1 g/dL (ref 6.5–8.1)

## 2022-08-04 LAB — POC OCCULT BLOOD, ED: Fecal Occult Bld: POSITIVE — AB

## 2022-08-04 LAB — LIPASE, BLOOD: Lipase: 36 U/L (ref 11–51)

## 2022-08-04 MED ORDER — IOHEXOL 300 MG/ML  SOLN
100.0000 mL | Freq: Once | INTRAMUSCULAR | Status: AC | PRN
  Administered 2022-08-04: 100 mL via INTRAVENOUS

## 2022-08-04 NOTE — ED Provider Notes (Incomplete)
[place] EMERGENCY DEPARTMENT AT [facility] Provider Note   CSN: [id number] Arrival date & time: 08/04/22  2216     History  Chief Complaint  Patient presents with  . Constipation    Michelle Griffin is a 52 y.o. female who presents emergency department brought in by EMS for concerns for constipation x 1 week.  Notes history of similar symptoms.  Notes that she has had 3 small loose bowel movements within the past week.  She has tried home enemas, digital stimulation, MiraLAX, Colace.  Was given Zofran and fentanyl and route by EMS.  Has associated diffuse abdominal pain, bright red blood in stool, and nausea.  Denies vomiting, urinary symptoms, fever,***.   The history is provided by the patient. No language interpreter was used.       Home Medications Prior to Admission medications   Medication Sig Start Date End Date Taking? Authorizing Provider  albuterol (VENTOLIN HFA) 108 (90 Base) MCG/ACT inhaler Inhale 1-2 puffs into the lungs every 4-6 hours as needed for cough or wheeze 01/20/22   Tysinger, Camelia Eng, PA-C  amoxicillin-clavulanate (AUGMENTIN) 875-125 MG tablet Take 1 tablet by mouth 2 (two) times daily. 01/22/22   Brunetta Jeans, PA-C  amoxicillin-clavulanate (AUGMENTIN) 875-125 MG tablet Take 1 tablet by mouth 2 (two) times daily. 07/06/22   Tysinger, Camelia Eng, PA-C  azelastine (OPTIVAR) 0.05 % ophthalmic solution Instill 1 drop into the affected eye(s) twice daily as directed 12/09/21     Azelastine HCl 137 MCG/SPRAY SOLN Place 1-2 sprays into both nostrils 2 (two) times daily. 07/23/22     Blood Pressure Monitoring (OMRON 3 SERIES BP MONITOR) DEVI Use as directed 09/16/21   Belanger, Kellie Shropshire, [facility]  EPINEPHrine 0.3 mg/0.3 mL IJ SOAJ injection Inject as directed into the muscle as needed for severe allergic reaction 01/20/22   Tysinger, Camelia Eng, PA-C  Ergocalciferol (VITAMIN D2) 50 MCG (2000 UT) TABS Take 1 tablet by mouth daily. 11/06/20   Tysinger, Camelia Eng, PA-C   estradiol (VIVELLE-DOT) 0.1 MG/24HR patch APPLY 1 PATCH ONTO SKIN 2 TIMES A WEEK 01/06/22     fluticasone (FLONASE) 50 MCG/ACT nasal spray Use 1-2 sprays in each nostril once to twice daily as directed 12/09/21     hydrocortisone 2.5 % cream Apply to affected area 2 (two) times daily. 01/20/22   Tysinger, Camelia Eng, PA-C  levocetirizine (XYZAL) 5 MG tablet Take 1 tablet by mouth once daily in the evening 12/09/21     montelukast (SINGULAIR) 10 MG tablet Take 1 tablet by mouth every evening 12/09/21     ondansetron (ZOFRAN) 4 MG tablet Take 1 tablet by mouth every 8 hours as needed for nausea or vomiting. 04/30/22   Tysinger, Camelia Eng, PA-C  pantoprazole (PROTONIX) 40 MG tablet TAKE 1 TABLET BY MOUTH DAILY 01/21/22 01/21/23  Tysinger, Camelia Eng, PA-C  prednisoLONE acetate (PRED FORTE) 1 % ophthalmic suspension Place 1 drop into right eyes  4 times a day for 1 week, then twice a day for 1 week. 07/02/22     Probiotic Product (RESTORA) CAPS Take 1 capsule by mouth daily. 01/21/22   Tysinger, Camelia Eng, PA-C  rosuvastatin (CRESTOR) 10 MG tablet Take 1 tablet (10 mg total) by mouth daily. 04/30/22 04/30/23  Tysinger, Camelia Eng, PA-C  Semaglutide-Weight Management (WEGOVY) 1.7 MG/0.75ML SOAJ Inject 1.7 mg into the skin once a week. 06/17/22   Tysinger, Camelia Eng, PA-C  sertraline (ZOLOFT) 100 MG tablet Take 1 tablet (100 mg total)  by mouth daily. 06/03/22   Tysinger, Camelia Eng, PA-C  traZODone (DESYREL) 50 MG tablet Take 1/2-1 tablet (25-50 mg total) by mouth at bedtime as needed for sleep. 01/20/22   Tysinger, Camelia Eng, PA-C      Allergies    Sulfa antibiotics, Ambien [zolpidem], and Percocet [oxycodone-acetaminophen]    Review of Systems   Review of Systems  Gastrointestinal:  Positive for constipation.  All other systems reviewed and are negative.   Physical Exam Updated Vital Signs BP (!) 131/59   Pulse 85   Temp 98.2 F (36.8 C) (Oral)   Resp 18   Ht '5\' 10"'$  (1.778 m)   Wt 83.9 kg   LMP 03/16/2015   SpO2 100%    BMI 26.54 kg/m  Physical Exam Vitals and nursing note reviewed. Exam conducted with a chaperone present.  Constitutional:      General: She is not in acute distress.    Appearance: She is not diaphoretic.  HENT:     Head: Normocephalic and atraumatic.     Mouth/Throat:     Pharynx: No oropharyngeal exudate.  Eyes:     General: No scleral icterus.    Conjunctiva/sclera: Conjunctivae normal.  Cardiovascular:     Rate and Rhythm: Normal rate and regular rhythm.     Pulses: Normal pulses.     Heart sounds: Normal heart sounds.  Pulmonary:     Effort: Pulmonary effort is normal. No respiratory distress.     Breath sounds: Normal breath sounds. No wheezing.  Abdominal:     General: Bowel sounds are normal.     Palpations: Abdomen is soft. There is no mass.     Tenderness: There is no abdominal tenderness. There is no guarding or rebound.  Genitourinary:    Rectum: Tenderness present. No mass, anal fissure, external hemorrhoid or internal hemorrhoid. Normal anal tone.     Comments: RN chaperone Warehouse manager) present for exam. TTP noted to rectum. No appreciable external hemorrhoid, internal hemorrhoid, mass, anal fissure, abnormal anal tone.  Soft stool noted on digital exam in rectal vault. No gross blood noted to rectal vault. Musculoskeletal:        General: Normal range of motion.     Cervical back: Normal range of motion and neck supple.  Skin:    General: Skin is warm and dry.  Neurological:     Mental Status: She is alert.  Psychiatric:        Behavior: Behavior normal.     ED Results / Procedures / Treatments   Labs (all labs ordered are listed, but only abnormal results are displayed) Labs Reviewed  CBC WITH DIFFERENTIAL/PLATELET - Abnormal; Notable for the following components:      Result Value   WBC 12.5 (*)    Hemoglobin 11.0 (*)    HCT 34.2 (*)    MCV 77.7 (*)    MCH 25.0 (*)    Neutro Abs 10.8 (*)    All other components within normal limits   COMPREHENSIVE METABOLIC PANEL - Abnormal; Notable for the following components:   Potassium 3.2 (*)    CO2 21 (*)    Glucose, Bld 106 (*)    All other components within normal limits  POC OCCULT BLOOD, ED - Abnormal; Notable for the following components:   Fecal Occult Bld POSITIVE (*)    All other components within normal limits  LIPASE, BLOOD  URINALYSIS, ROUTINE W REFLEX MICROSCOPIC    EKG None  Radiology No results found.  Procedures Procedures    Medications Ordered in ED Medications - No data to display  ED Course/ Medical Decision Making/ A&P Clinical Course as of 08/04/22 2308  Tue Aug 04, 2022  2240 Discussed with patient in detail due to her inability to tolerate rectal digital exam that the likelihood of fecal disimpaction if the need arises.  The CT scan would be low due to patient's pain.  Patient received 100 mcg of fentanyl with EMS prior to arrival and prior to the rectal digital exam. [SB]  2247 Fecal Occult Blood, POC(!): POSITIVE [SB]    Clinical Course User Index [SB] Chalon Zobrist A, PA-C                             Medical Decision Making  Patient with constipation x 1 week. Notes that she has had three small bowel movement.  Tried digital stimulation, home enemas, miralax, colace. Last bowel movement ***.  On exam patient with diffuse abdominal TTP, otherwise no acute cardiovascular, respiratory, abdominal exam findings. Vital signs stable, pt afebrile.  Differential diagnosis includes constipation, UTI, medication-related, fecal impaction, or bowel obstruction.   Labs:  I ordered, and personally interpreted labs.  The pertinent results include:   Lipase *** CBC *** CMP *** Urinalysis ***  Imaging: I ordered imaging studies including CT abdomen pelvis I independently visualized and interpreted imaging which showed: *** I agree with the radiologist interpretation  Medications:  I ordered medication including *** for *** Reevaluation of the  patient after these medicines and interventions, I reevaluated the patient and found that they have {resolved/improved/worsened:23923::"improved"} I have reviewed the patients home medicines and have made adjustments as needed   {Consultations: I requested consultation with the {sabspecialists:27145}, and discussed lab and imaging findings as well as pertinent plan - they recommend: ***}   Disposition: {End of MDM here with the likely diagnosis}. Patient presentation likely due to constipation. *** After consideration of the diagnostic results and the patients response to treatment, I feel that the patient would benefit from {sabdispo:27146}. Will discharge home with prescription mag citrate and MiraLAX as needed for maintenance.  Encouraged to patient to continue with fluid intake and eat fiber rich foods.  Advised patient can follow-up with primary care provider next week if symptoms continuing.  Strict return precautions discussed with patient.  Supportive care measures discussed with patient.  Patient appears safe for discharge at this time.  Follow-up as indicated in discharge paperwork.  This chart was dictated using voice recognition software, Dragon. Despite the best efforts of this provider to proofread and correct errors, errors may still occur which can change documentation meaning.  Final Clinical Impression(s) / ED Diagnoses Final diagnoses:  None    Rx / DC Orders ED Discharge Orders     None

## 2022-08-04 NOTE — ED Triage Notes (Signed)
Pt comes from home by EMS for constipation x 1 week. Pt has history of same. She reports that she has had 3 very small loose BM's , but nothing significant. She has tried home enemas, digital stimulation, and Miralax and colace. Pt was given zofran and fentanyl 132mg enroute. Pt has diffuse abdominal pain that she describes as a "gassy feeling".

## 2022-08-04 NOTE — Discharge Instructions (Signed)
It was a pleasure taking care of you today!   Your CT scan was notable for moderate amount of stool...    .  It is important to maintain hydration and ensure to maintain fluid intake.  Ensure to eat fiber rich foods.  Take the magnesium citrate starting today as prescribed. Start the MiraLAX starting tomorrow as prescribed for 2 weeks.  You may follow-up with your primary care provider as needed.  Return to the emergency department if you are experiencing increasing/worsening constipation, unable to maintain fluid intake, fever, or worsening symptoms.

## 2022-08-04 NOTE — ED Notes (Signed)
Pt called out requesting pain medication. Provider aware

## 2022-08-04 NOTE — ED Provider Notes (Signed)
[facility] Provider Note   CSN: [id number] Arrival date & time: 08/04/22  2216     History  Chief Complaint  Patient presents with   Constipation    Michelle Griffin is a 52 y.o. female who presents emergency department brought in by EMS for concerns for constipation x 1 week.  Notes history of similar symptoms.  Notes that she has had 3 small loose bowel movements within the past week.  She has tried home enemas, digital stimulation, MiraLAX, Colace.  Was given Zofran and fentanyl and route by EMS.  Has associated diffuse abdominal pain, bright red blood in stool, and nausea.  Denies vomiting, urinary symptoms, fever.   The history is provided by the patient. No language interpreter was used.       Home Medications Prior to Admission medications   Medication Sig Start Date End Date Taking? Authorizing Provider  albuterol (VENTOLIN HFA) 108 (90 Base) MCG/ACT inhaler Inhale 1-2 puffs into the lungs every 4-6 hours as needed for cough or wheeze 01/20/22   Tysinger, Camelia Eng, PA-C  amoxicillin-clavulanate (AUGMENTIN) 875-125 MG tablet Take 1 tablet by mouth 2 (two) times daily. 01/22/22   Brunetta Jeans, PA-C  amoxicillin-clavulanate (AUGMENTIN) 875-125 MG tablet Take 1 tablet by mouth 2 (two) times daily. 07/06/22   Tysinger, Camelia Eng, PA-C  azelastine (OPTIVAR) 0.05 % ophthalmic solution Instill 1 drop into the affected eye(s) twice daily as directed 12/09/21     Azelastine HCl 137 MCG/SPRAY SOLN Place 1-2 sprays into both nostrils 2 (two) times daily. 07/23/22     Blood Pressure Monitoring (OMRON 3 SERIES BP MONITOR) DEVI Use as directed 09/16/21   Belanger, Kellie Shropshire, [facility]  EPINEPHrine 0.3 mg/0.3 mL IJ SOAJ injection Inject as directed into the muscle as needed for severe allergic reaction 01/20/22   Tysinger, Camelia Eng, PA-C  Ergocalciferol (VITAMIN D2) 50 MCG (2000 UT) TABS Take 1 tablet by mouth daily. 11/06/20   Tysinger, Camelia Eng, PA-C  estradiol  (VIVELLE-DOT) 0.1 MG/24HR patch APPLY 1 PATCH ONTO SKIN 2 TIMES A WEEK 01/06/22     fluticasone (FLONASE) 50 MCG/ACT nasal spray Use 1-2 sprays in each nostril once to twice daily as directed 12/09/21     hydrocortisone 2.5 % cream Apply to affected area 2 (two) times daily. 01/20/22   Tysinger, Camelia Eng, PA-C  levocetirizine (XYZAL) 5 MG tablet Take 1 tablet by mouth once daily in the evening 12/09/21     montelukast (SINGULAIR) 10 MG tablet Take 1 tablet by mouth every evening 12/09/21     ondansetron (ZOFRAN) 4 MG tablet Take 1 tablet by mouth every 8 hours as needed for nausea or vomiting. 04/30/22   Tysinger, Camelia Eng, PA-C  pantoprazole (PROTONIX) 40 MG tablet TAKE 1 TABLET BY MOUTH DAILY 01/21/22 01/21/23  Tysinger, Camelia Eng, PA-C  prednisoLONE acetate (PRED FORTE) 1 % ophthalmic suspension Place 1 drop into right eyes  4 times a day for 1 week, then twice a day for 1 week. 07/02/22     Probiotic Product (RESTORA) CAPS Take 1 capsule by mouth daily. 01/21/22   Tysinger, Camelia Eng, PA-C  rosuvastatin (CRESTOR) 10 MG tablet Take 1 tablet (10 mg total) by mouth daily. 04/30/22 04/30/23  Tysinger, Camelia Eng, PA-C  Semaglutide-Weight Management (WEGOVY) 1.7 MG/0.75ML SOAJ Inject 1.7 mg into the skin once a week. 06/17/22   Tysinger, Camelia Eng, PA-C  sertraline (ZOLOFT) 100 MG tablet Take 1 tablet (100 mg total)  by mouth daily. 06/03/22   Tysinger, Camelia Eng, PA-C  traZODone (DESYREL) 50 MG tablet Take 1/2-1 tablet (25-50 mg total) by mouth at bedtime as needed for sleep. 01/20/22   Tysinger, Camelia Eng, PA-C      Allergies    Sulfa antibiotics, Ambien [zolpidem], and Percocet [oxycodone-acetaminophen]    Review of Systems   Review of Systems  Gastrointestinal:  Positive for constipation.  All other systems reviewed and are negative.   Physical Exam Updated Vital Signs BP (!) 131/59   Pulse 85   Temp 98.2 F (36.8 C) (Oral)   Resp 18   Ht '5\' 10"'$  (1.778 m)   Wt 83.9 kg   LMP 03/16/2015   SpO2 100%   BMI 26.54  kg/m  Physical Exam Vitals and nursing note reviewed. Exam conducted with a chaperone present.  Constitutional:      General: She is not in acute distress.    Appearance: She is not diaphoretic.  HENT:     Head: Normocephalic and atraumatic.     Mouth/Throat:     Pharynx: No oropharyngeal exudate.  Eyes:     General: No scleral icterus.    Conjunctiva/sclera: Conjunctivae normal.  Cardiovascular:     Rate and Rhythm: Normal rate and regular rhythm.     Pulses: Normal pulses.     Heart sounds: Normal heart sounds.  Pulmonary:     Effort: Pulmonary effort is normal. No respiratory distress.     Breath sounds: Normal breath sounds. No wheezing.  Abdominal:     General: Bowel sounds are normal.     Palpations: Abdomen is soft. There is no mass.     Tenderness: There is no abdominal tenderness. There is no guarding or rebound.  Genitourinary:    Rectum: Tenderness present. No mass, anal fissure, external hemorrhoid or internal hemorrhoid. Normal anal tone.     Comments: RN chaperone Warehouse manager) present for exam. TTP noted to rectum. No appreciable external hemorrhoid, internal hemorrhoid, mass, anal fissure, abnormal anal tone.  Soft stool noted on digital exam in rectal vault. No gross blood noted to rectal vault. Musculoskeletal:        General: Normal range of motion.     Cervical back: Normal range of motion and neck supple.  Skin:    General: Skin is warm and dry.  Neurological:     Mental Status: She is alert.  Psychiatric:        Behavior: Behavior normal.     ED Results / Procedures / Treatments   Labs (all labs ordered are listed, but only abnormal results are displayed) Labs Reviewed  CBC WITH DIFFERENTIAL/PLATELET - Abnormal; Notable for the following components:      Result Value   WBC 12.5 (*)    Hemoglobin 11.0 (*)    HCT 34.2 (*)    MCV 77.7 (*)    MCH 25.0 (*)    Neutro Abs 10.8 (*)    All other components within normal limits  COMPREHENSIVE METABOLIC  PANEL - Abnormal; Notable for the following components:   Potassium 3.2 (*)    CO2 21 (*)    Glucose, Bld 106 (*)    All other components within normal limits  POC OCCULT BLOOD, ED - Abnormal; Notable for the following components:   Fecal Occult Bld POSITIVE (*)    All other components within normal limits  LIPASE, BLOOD  URINALYSIS, ROUTINE W REFLEX MICROSCOPIC    EKG None  Radiology CT ABDOMEN PELVIS W  CONTRAST  Result Date: 08/05/2022 CLINICAL DATA:  Abdominal pain, constipation EXAM: CT ABDOMEN AND PELVIS WITH CONTRAST TECHNIQUE: Multidetector CT imaging of the abdomen and pelvis was performed using the standard protocol following bolus administration of intravenous contrast. RADIATION DOSE REDUCTION: This exam was performed according to the departmental dose-optimization program which includes automated exposure control, adjustment of the mA and/or kV according to patient size and/or use of iterative reconstruction technique. CONTRAST:  136m OMNIPAQUE IOHEXOL 300 MG/ML  SOLN COMPARISON:  05/19/2020 FINDINGS: Lower chest: Lung bases are clear. Hepatobiliary: Liver is within normal limits. Status post cholecystectomy. No intrahepatic or extrahepatic duct dilatation. Pancreas: Within normal limits. Spleen: Within normal limits. Adrenals/Urinary Tract: Adrenal glands are within normal limits. Kidneys are within normal limits.  No hydronephrosis. Bladder is moderately distended. Stomach/Bowel: Stomach is within normal limits. No evidence of bowel obstruction. Normal appendix (series 2/image 62). No colonic wall thickening or inflammatory changes. Mild rectosigmoid colonic stool burden. Vascular/Lymphatic: No evidence of abdominal aortic aneurysm. No suspicious abdominopelvic lymphadenopathy. Reproductive: Status post hysterectomy. No adnexal masses. Other: No abdominopelvic ascites. Musculoskeletal: Visualized osseous structures are within normal limits. IMPRESSION: Mild rectosigmoid colonic stool  burden. Otherwise negative CT abdomen/pelvis. Prior cholecystectomy hysterectomy. Electronically Signed   By: SJulian HyM.D.   On: 08/05/2022 00:04    Procedures Procedures    Medications Ordered in ED Medications  potassium chloride (KLOR-CON M) CR tablet 30 mEq (has no administration in time range)  morphine (PF) 4 MG/ML injection 4 mg (has no administration in time range)  iohexol (OMNIPAQUE) 300 MG/ML solution 100 mL (100 mLs Intravenous Contrast Given 08/04/22 2341)    ED Course/ Medical Decision Making/ A&P Clinical Course as of 08/05/22 0336  Tue Aug 04, 2022  2240 Discussed with patient in detail due to her inability to tolerate rectal digital exam that the likelihood of fecal disimpaction if the need arises.  The CT scan would be low due to patient's pain.  Patient received 100 mcg of fentanyl with EMS prior to arrival and prior to the rectal digital exam. [SB]  2247 Fecal Occult Blood, POC(!): POSITIVE [SB]  Wed Aug 05, 2022  0021 Discussed with patient lab and imaging findings. Answered all available questions.  [SB]  [id number] by RN that patient requesting Gas-X at this time. Pt provided with Gas-X and discussed with patient discharge treatment plan. Answered all available questions. Pt appears safe for discharge at this time.  [SB]  0146 Discussed with patient UA results and PVR. Also discussed with patient that we will provide her with IVF due to her nausea/vomiting in the ED. Pt agreeable at this time.  [SB]  0200 Informed by RN that bladder scan showed large volume in bladder. Pt notes that she feels that she can urinate at this time.  [SB]  0219 PVR <30 mL [SB]  0224 Discussed with patient discharge treatment plan.  Discussed with patient that she will finish out her fluids and then be discharged home with Zofran.  Patient agreeable at this time. [SB]  0306 RN notified that patient feels better with treatment regimen prior to discharge.  [SB]    Clinical Course  User Index [SB] Keyan Folson A, PA-C                             Medical Decision Making Amount and/or Complexity of Data Reviewed Labs: ordered. Decision-making details documented in ED Course. Radiology: ordered.  Risk OTC drugs.  Prescription drug management.   Patient with constipation x 1 week. Notes that she has had three small bowel movement.  Tried digital stimulation, home enemas, miralax, colace. On exam patient with diffuse abdominal TTP, otherwise no acute cardiovascular, respiratory, abdominal exam findings. Vital signs stable, pt afebrile.  Differential diagnosis includes constipation, UTI, medication-related, fecal impaction, or bowel obstruction.   Labs:  I ordered, and personally interpreted labs.  The pertinent results include:   Lipase negative CBC with mildly elevated leukocytosis, hgb slightly decreased at 11 CMP with hypokalemia at 3.2, otherwise unremarkable Urinalysis notable for small hgb, trace of leukocytes, otherwise nitrite negative. Pt asymptomatic. Fecal occult positive  Imaging: I ordered imaging studies including CT abdomen pelvis I independently visualized and interpreted imaging which showed:  Mild rectosigmoid colonic stool burden.  Otherwise negative CT abdomen/pelvis.  Prior cholecystectomy hysterectomy.   I agree with the radiologist interpretation  Medications:  I ordered medication including Zofran, morphine, potassium, Gas-X for symptom management Reevaluation of the patient after these medicines and interventions, I reevaluated the patient and found that they have improved I have reviewed the patients home medicines and have made adjustments as needed   Disposition: Presentation notable for constipation, nausea, and vomiting. Positive fecal occult likely due to rectal manipulation by partner prior to arrival to ED. No appreciable external hemorrhoid noted on exam. Doubt concerns at this time for appendicitis, cholecystitis, acute  cystitis, diverticulitis. After consideration of the diagnostic results and the patients response to treatment, I feel that the patient would benefit from Discharge home. Due to small volume of stool noted on CT scan and patient with bowel movement in the ED, patient will be discharged home. Discussed that patient can take over the counter miralax PRN. Pt with large bladder volume noted on bladder scan, however, this resolved prior to discharge. Pt able to urinate without difficulty, PVR <30 mL. Encouraged to patient to continue with fluid intake and eat fiber rich foods.  Advised patient can follow-up with primary care provider if symptoms continuing.  Strict return precautions discussed with patient.  Supportive care measures discussed with patient.  Patient appears safe for discharge at this time.  Follow-up as indicated in discharge paperwork.  This chart was dictated using voice recognition software, Dragon. Despite the best efforts of this provider to proofread and correct errors, errors may still occur which can change documentation meaning.  Final Clinical Impression(s) / ED Diagnoses Final diagnoses:  Constipation, unspecified constipation type    Rx / DC Orders ED Discharge Orders     None         Norlan Rann A, PA-C 08/05/22 [place], [place], DO 08/05/22 1511

## 2022-08-04 NOTE — ED Notes (Signed)
Pt taken to CT.

## 2022-08-04 NOTE — ED Notes (Signed)
Provider at bedside

## 2022-08-05 ENCOUNTER — Other Ambulatory Visit: Payer: Self-pay

## 2022-08-05 ENCOUNTER — Other Ambulatory Visit (HOSPITAL_COMMUNITY): Payer: Self-pay

## 2022-08-05 ENCOUNTER — Other Ambulatory Visit: Payer: Self-pay | Admitting: Medical

## 2022-08-05 LAB — URINALYSIS, ROUTINE W REFLEX MICROSCOPIC
Bacteria, UA: NONE SEEN
Bilirubin Urine: NEGATIVE
Glucose, UA: NEGATIVE mg/dL
Ketones, ur: 5 mg/dL — AB
Nitrite: NEGATIVE
Protein, ur: NEGATIVE mg/dL
Specific Gravity, Urine: 1.017 (ref 1.005–1.030)
pH: 8 (ref 5.0–8.0)

## 2022-08-05 MED ORDER — METOCLOPRAMIDE HCL 5 MG/ML IJ SOLN
5.0000 mg | Freq: Once | INTRAMUSCULAR | Status: AC
  Administered 2022-08-05: 5 mg via INTRAVENOUS
  Filled 2022-08-05: qty 2

## 2022-08-05 MED ORDER — SODIUM CHLORIDE 0.9 % IV BOLUS
1000.0000 mL | Freq: Once | INTRAVENOUS | Status: AC
  Administered 2022-08-05: 1000 mL via INTRAVENOUS

## 2022-08-05 MED ORDER — ONDANSETRON HCL 4 MG PO TABS
4.0000 mg | ORAL_TABLET | Freq: Three times a day (TID) | ORAL | 0 refills | Status: DC | PRN
  Filled 2022-08-05: qty 10, 4d supply, fill #0

## 2022-08-05 MED ORDER — MORPHINE SULFATE (PF) 4 MG/ML IV SOLN
4.0000 mg | Freq: Once | INTRAVENOUS | Status: AC
  Administered 2022-08-05: 4 mg via INTRAVENOUS
  Filled 2022-08-05: qty 1

## 2022-08-05 MED ORDER — ONDANSETRON HCL 4 MG/2ML IJ SOLN
4.0000 mg | Freq: Once | INTRAMUSCULAR | Status: AC
  Administered 2022-08-05: 4 mg via INTRAVENOUS
  Filled 2022-08-05: qty 2

## 2022-08-05 MED ORDER — SIMETHICONE 80 MG PO CHEW
80.0000 mg | CHEWABLE_TABLET | Freq: Once | ORAL | Status: DC
  Filled 2022-08-05: qty 1

## 2022-08-05 MED ORDER — POTASSIUM CHLORIDE CRYS ER 20 MEQ PO TBCR
30.0000 meq | EXTENDED_RELEASE_TABLET | Freq: Once | ORAL | Status: AC
  Administered 2022-08-05: 30 meq via ORAL
  Filled 2022-08-05: qty 1

## 2022-08-05 NOTE — ED Notes (Signed)
Post void bladder scan volume < 70m

## 2022-08-05 NOTE — ED Notes (Signed)
Pt vomiting. Provider aware and meds ordered

## 2022-08-05 NOTE — ED Notes (Signed)
Pt given apple juice per pt request

## 2022-08-05 NOTE — ED Notes (Signed)
Pt reported that she may have had a small BM in her pants while she was vomiting earlier. Pt cleaned and changed into mesh underwear and paper scrubs. Pt assisted to bedside toilet to urinate and voided a large amount of urine and minimal stool. Pt assisted back to bed and post-void bladder scan performed.

## 2022-08-07 DIAGNOSIS — J3081 Allergic rhinitis due to animal (cat) (dog) hair and dander: Secondary | ICD-10-CM | POA: Diagnosis not present

## 2022-08-07 DIAGNOSIS — J3089 Other allergic rhinitis: Secondary | ICD-10-CM | POA: Diagnosis not present

## 2022-08-07 DIAGNOSIS — J301 Allergic rhinitis due to pollen: Secondary | ICD-10-CM | POA: Diagnosis not present

## 2022-08-12 DIAGNOSIS — Z1231 Encounter for screening mammogram for malignant neoplasm of breast: Secondary | ICD-10-CM

## 2022-08-18 DIAGNOSIS — J3089 Other allergic rhinitis: Secondary | ICD-10-CM | POA: Diagnosis not present

## 2022-08-18 DIAGNOSIS — J3081 Allergic rhinitis due to animal (cat) (dog) hair and dander: Secondary | ICD-10-CM | POA: Diagnosis not present

## 2022-08-18 DIAGNOSIS — J301 Allergic rhinitis due to pollen: Secondary | ICD-10-CM | POA: Diagnosis not present

## 2022-08-19 ENCOUNTER — Other Ambulatory Visit (HOSPITAL_COMMUNITY): Payer: Self-pay

## 2022-08-19 ENCOUNTER — Ambulatory Visit (INDEPENDENT_AMBULATORY_CARE_PROVIDER_SITE_OTHER): Payer: 59 | Admitting: Medical

## 2022-08-19 VITALS — BP 120/70 | HR 64 | Wt 190.4 lb

## 2022-08-19 DIAGNOSIS — F419 Anxiety disorder, unspecified: Secondary | ICD-10-CM | POA: Diagnosis not present

## 2022-08-19 DIAGNOSIS — Z79899 Other long term (current) drug therapy: Secondary | ICD-10-CM | POA: Diagnosis not present

## 2022-08-19 DIAGNOSIS — E669 Obesity, unspecified: Secondary | ICD-10-CM

## 2022-08-19 DIAGNOSIS — K5903 Drug induced constipation: Secondary | ICD-10-CM | POA: Diagnosis not present

## 2022-08-19 DIAGNOSIS — E785 Hyperlipidemia, unspecified: Secondary | ICD-10-CM | POA: Diagnosis not present

## 2022-08-19 DIAGNOSIS — N3281 Overactive bladder: Secondary | ICD-10-CM

## 2022-08-19 LAB — POCT URINALYSIS DIP (PROADVANTAGE DEVICE)
Bilirubin, UA: NEGATIVE
Blood, UA: NEGATIVE
Glucose, UA: NEGATIVE mg/dL
Ketones, POC UA: NEGATIVE mg/dL
Leukocytes, UA: NEGATIVE
Nitrite, UA: NEGATIVE
Protein Ur, POC: NEGATIVE mg/dL
Specific Gravity, Urine: 1.005
Urobilinogen, Ur: NEGATIVE
pH, UA: 7 (ref 5.0–8.0)

## 2022-08-19 MED ORDER — WEGOVY 1 MG/0.5ML ~~LOC~~ SOAJ
1.0000 mg | SUBCUTANEOUS | 0 refills | Status: DC
  Filled 2022-08-19: qty 2, 28d supply, fill #0
  Filled 2022-09-15 – 2022-09-17 (×3): qty 2, 28d supply, fill #1

## 2022-08-19 MED ORDER — BREXPIPRAZOLE 2 MG PO TABS
2.0000 mg | ORAL_TABLET | Freq: Every day | ORAL | 2 refills | Status: DC
  Filled 2022-08-19: qty 30, 30d supply, fill #0
  Filled 2022-09-15: qty 30, 30d supply, fill #1
  Filled 2022-10-15: qty 30, 30d supply, fill #2

## 2022-08-19 NOTE — Progress Notes (Signed)
Subjective:  Michelle Griffin is a 52 y.o. female who presents for Chief Complaint  Patient presents with   Follow-up    Follow-up from ED- went to ER for conspitation     Here for follow-up.  She went to emergency department 08/04/2022 for severe constipation.  She had tried several measures including self disimpaction, enema and other.  She finally had a resolution of her constipation that day at the emergency department  She had a similar issue a few months ago.  She now attributes both to her weight loss medicine as a side effect.  She does want to go down the dose on the [facility].  She did not have this problem Mounjaro but insurance forced her to change to [facility] and she has just learned that insurance will no longer cover any of these medicines after Matika 1  She is exercising regularly.  She is happy with the weight loss she has had with weight loss medicine.  She was taking Crestor but decided to stop this for now.  She wants to work on continuing lifestyle measures and recheck her cholesterol at her next physical.  She works in radiology and did get a free CT coronary screening when they were piloting the program.  She says her CT coronary showed no CAD at all  No constipation since her emergency department visit recently  She is having little bit of increased urination but she has a history of overactive bladder.  No burning with urination, no blood in urine, no odor in urine.  No fever body aches or chills.  No other aggravating or relieving factors.    No other c/o.  Past Medical History:  Diagnosis Date   Adenocarcinoma in situ of cervix    Allergy    sees [name]   Anemia    hx/o iron deficiency due to heavy periods   Asthma    exercise induced   Bursitis    Cancer ([place])    cervical   COVID-19 05/26/2019   fatigue, h/a, loss of taste and smell, fatige x 1 month, loss of taste x 5 months all symptoms rsolved   DDD (degenerative disc disease), lumbar    Fatty liver     GERD (gastroesophageal reflux disease)    [name]   H/O mammogram    Hiatal hernia    small per EGD, [name]   Hyperlipidemia 2012   was on pravastatin prior   Migraine    prior eval with neurologist, Dr. Melton Alar   Overactive bladder    PMDD (premenstrual dysphoric disorder)    Pyelonephritis    age 14yo   Uterine infection 2015   Vitamin D deficiency    Wears glasses    Current Outpatient Medications on File Prior to Visit  Medication Sig Dispense Refill   albuterol (VENTOLIN HFA) 108 (90 Base) MCG/ACT inhaler Inhale 1-2 puffs into the lungs every 4-6 hours as needed for cough or wheeze 6.7 g 2   azelastine (OPTIVAR) 0.05 % ophthalmic solution Instill 1 drop into the affected eye(s) twice daily as directed 6 mL 3   Azelastine HCl 137 MCG/SPRAY SOLN Place 1-2 sprays into both nostrils 2 (two) times daily. 90 mL 3   Blood Pressure Monitoring (OMRON 3 SERIES BP MONITOR) DEVI Use as directed 1 each 0   Ergocalciferol (VITAMIN D2) 50 MCG (2000 UT) TABS Take 1 tablet by mouth daily. 90 tablet 3   estradiol (VIVELLE-DOT) 0.1 MG/24HR patch APPLY 1 PATCH ONTO  SKIN 2 TIMES A WEEK 24 patch 4   fluticasone (FLONASE) 50 MCG/ACT nasal spray Use 1-2 sprays in each nostril once to twice daily as directed 48 g 2   levocetirizine (XYZAL) 5 MG tablet Take 1 tablet by mouth once daily in the evening 90 tablet 2   montelukast (SINGULAIR) 10 MG tablet Take 1 tablet by mouth every evening 90 tablet 2   ondansetron (ZOFRAN) 4 MG tablet Take 1 tablet by mouth every 8 hours as needed for nausea or vomiting. 10 tablet 0   pantoprazole (PROTONIX) 40 MG tablet TAKE 1 TABLET BY MOUTH DAILY 90 tablet 1   prednisoLONE acetate (PRED FORTE) 1 % ophthalmic suspension Place 1 drop into right eyes  4 times a day for 1 week, then twice a day for 1 week. 10 mL 0   Probiotic Product (RESTORA) CAPS Take 1 capsule by mouth daily. 90 capsule 3   sertraline (ZOLOFT) 100 MG tablet Take 1 tablet (100 mg total) by mouth  daily. 90 tablet 0   traZODone (DESYREL) 50 MG tablet Take 1/2-1 tablet (25-50 mg total) by mouth at bedtime as needed for sleep. 90 tablet 3   EPINEPHrine 0.3 mg/0.3 mL IJ SOAJ injection Inject as directed into the muscle as needed for severe allergic reaction 1 each 1   No current facility-administered medications on file prior to visit.     The following portions of the patient's history were reviewed and updated as appropriate: allergies, current medications, past family history, past medical history, past social history, past surgical history and problem list.  ROS Otherwise as in subjective above  Objective: BP 120/70   Pulse 64   Wt 190 lb 6.4 oz (86.4 kg)   LMP 03/16/2015   BMI 27.32 kg/m   General appearance: alert, no distress, well developed, well nourished    Assessment: Encounter Diagnoses  Name Primary?   Drug-induced constipation Yes   High risk medication use    OAB (overactive bladder)    Obesity with serious comorbidity, unspecified classification, unspecified obesity type    Hyperlipidemia, unspecified hyperlipidemia type    Anxiety      Plan: Constipation-resolved at this point.  We will step down on the dosing of the [facility] since she is has significant constipation.  Continue to hydrate well with water and continue MiraLAX for now  Although she has had significant weight loss and success with Mounjaro, she did not do as well on Wegovy but had to change it is per insurance.  Nevertheless her insurance no longer covering this medicine in the next month so she will phase out the [facility] by using 1 dose every 2 to 3 weeks.  Continue healthy lifestyle that she has been doing  Hyperlipidemia-she is going to go without medicine for now and recheck labs at her next physical  Anxiety-she continues on Zoloft 100 mg daily but we recently added samples of resulted which really helped so she wants to continue this as well  Anet was seen today for  follow-up.  Diagnoses and all orders for this visit:  Drug-induced constipation  High risk medication use  OAB (overactive bladder) -     POCT Urinalysis DIP (Proadvantage Device)  Obesity with serious comorbidity, unspecified classification, unspecified obesity type  Hyperlipidemia, unspecified hyperlipidemia type  Anxiety  Other orders -     Semaglutide-Weight Management (WEGOVY) 1 MG/0.5ML SOAJ; Inject 1 mg into the skin once a week. -     brexpiprazole (REXULTI) 2 MG TABS  tablet; Take 1 tablet (2 mg total) by mouth daily.    Follow up: 65mo

## 2022-08-20 ENCOUNTER — Other Ambulatory Visit: Payer: Self-pay

## 2022-08-25 ENCOUNTER — Other Ambulatory Visit: Payer: Self-pay

## 2022-08-25 ENCOUNTER — Other Ambulatory Visit (HOSPITAL_COMMUNITY): Payer: Self-pay

## 2022-08-25 ENCOUNTER — Other Ambulatory Visit: Payer: Self-pay | Admitting: Medical

## 2022-08-25 MED ORDER — ONDANSETRON HCL 4 MG PO TABS
4.0000 mg | ORAL_TABLET | Freq: Three times a day (TID) | ORAL | 0 refills | Status: DC | PRN
  Filled 2022-08-25: qty 10, 4d supply, fill #0

## 2022-08-25 NOTE — Telephone Encounter (Signed)
Uses this for nausea with wegovy.

## 2022-08-26 ENCOUNTER — Other Ambulatory Visit (HOSPITAL_COMMUNITY): Payer: Self-pay

## 2022-08-27 ENCOUNTER — Telehealth: Payer: Self-pay | Admitting: Internal Medicine

## 2022-08-27 NOTE — Telephone Encounter (Signed)
Pt had TD in 2020 but will be around a new born soon and needs to know if she needs to come back to get TDAP. Please advise

## 2022-08-31 ENCOUNTER — Other Ambulatory Visit: Payer: Self-pay

## 2022-08-31 ENCOUNTER — Other Ambulatory Visit: Payer: Self-pay | Admitting: Medical

## 2022-08-31 MED ORDER — PANTOPRAZOLE SODIUM 40 MG PO TBEC
40.0000 mg | DELAYED_RELEASE_TABLET | Freq: Every day | ORAL | 1 refills | Status: DC
  Filled 2022-08-31: qty 90, 90d supply, fill #0
  Filled 2022-11-27: qty 90, 90d supply, fill #1

## 2022-08-31 MED ORDER — SERTRALINE HCL 100 MG PO TABS
100.0000 mg | ORAL_TABLET | Freq: Every day | ORAL | 0 refills | Status: DC
  Filled 2022-08-31: qty 90, 90d supply, fill #0

## 2022-09-01 DIAGNOSIS — J3089 Other allergic rhinitis: Secondary | ICD-10-CM | POA: Diagnosis not present

## 2022-09-01 DIAGNOSIS — J3081 Allergic rhinitis due to animal (cat) (dog) hair and dander: Secondary | ICD-10-CM | POA: Diagnosis not present

## 2022-09-01 DIAGNOSIS — J301 Allergic rhinitis due to pollen: Secondary | ICD-10-CM | POA: Diagnosis not present

## 2022-09-02 NOTE — Telephone Encounter (Signed)
Pt is coming in Friday for TDAP.  Advised pt that not sure if insurance will pay due to having TD in 2020 but she could get one or call insurance

## 2022-09-03 ENCOUNTER — Other Ambulatory Visit (HOSPITAL_COMMUNITY): Payer: Self-pay

## 2022-09-04 ENCOUNTER — Other Ambulatory Visit (INDEPENDENT_AMBULATORY_CARE_PROVIDER_SITE_OTHER): Payer: 59

## 2022-09-04 ENCOUNTER — Other Ambulatory Visit (HOSPITAL_COMMUNITY): Payer: Self-pay

## 2022-09-04 DIAGNOSIS — Z23 Encounter for immunization: Secondary | ICD-10-CM

## 2022-09-15 ENCOUNTER — Other Ambulatory Visit: Payer: Self-pay

## 2022-09-15 ENCOUNTER — Other Ambulatory Visit (HOSPITAL_COMMUNITY): Payer: Self-pay

## 2022-09-15 ENCOUNTER — Other Ambulatory Visit: Payer: Self-pay | Admitting: Medical

## 2022-09-15 MED ORDER — LEVOCETIRIZINE DIHYDROCHLORIDE 5 MG PO TABS
5.0000 mg | ORAL_TABLET | Freq: Every evening | ORAL | 1 refills | Status: DC
  Filled 2022-09-15: qty 90, 90d supply, fill #0
  Filled 2022-12-16: qty 90, 90d supply, fill #1

## 2022-09-15 MED ORDER — ONDANSETRON HCL 4 MG PO TABS
4.0000 mg | ORAL_TABLET | Freq: Three times a day (TID) | ORAL | 0 refills | Status: DC | PRN
  Filled 2022-09-15: qty 30, 10d supply, fill #0

## 2022-09-16 ENCOUNTER — Other Ambulatory Visit: Payer: Self-pay

## 2022-09-17 DIAGNOSIS — J3081 Allergic rhinitis due to animal (cat) (dog) hair and dander: Secondary | ICD-10-CM | POA: Diagnosis not present

## 2022-09-17 DIAGNOSIS — J301 Allergic rhinitis due to pollen: Secondary | ICD-10-CM | POA: Diagnosis not present

## 2022-09-17 DIAGNOSIS — J3089 Other allergic rhinitis: Secondary | ICD-10-CM | POA: Diagnosis not present

## 2022-09-18 ENCOUNTER — Other Ambulatory Visit: Payer: Self-pay

## 2022-09-22 ENCOUNTER — Other Ambulatory Visit: Payer: Self-pay | Admitting: Medical

## 2022-09-22 ENCOUNTER — Other Ambulatory Visit: Payer: Self-pay

## 2022-09-22 MED ORDER — ROSUVASTATIN CALCIUM 10 MG PO TABS
10.0000 mg | ORAL_TABLET | Freq: Every day | ORAL | 0 refills | Status: DC
  Filled 2022-09-22: qty 90, 90d supply, fill #0

## 2022-09-23 DIAGNOSIS — G5602 Carpal tunnel syndrome, left upper limb: Secondary | ICD-10-CM | POA: Diagnosis not present

## 2022-09-23 DIAGNOSIS — G5601 Carpal tunnel syndrome, right upper limb: Secondary | ICD-10-CM | POA: Diagnosis not present

## 2022-09-23 DIAGNOSIS — J301 Allergic rhinitis due to pollen: Secondary | ICD-10-CM | POA: Diagnosis not present

## 2022-09-23 DIAGNOSIS — J3081 Allergic rhinitis due to animal (cat) (dog) hair and dander: Secondary | ICD-10-CM | POA: Diagnosis not present

## 2022-09-23 DIAGNOSIS — J3089 Other allergic rhinitis: Secondary | ICD-10-CM | POA: Diagnosis not present

## 2022-09-23 DIAGNOSIS — G5603 Carpal tunnel syndrome, bilateral upper limbs: Secondary | ICD-10-CM | POA: Diagnosis not present

## 2022-10-02 ENCOUNTER — Other Ambulatory Visit: Payer: Self-pay

## 2022-10-02 ENCOUNTER — Other Ambulatory Visit (HOSPITAL_COMMUNITY): Payer: Self-pay

## 2022-10-02 ENCOUNTER — Other Ambulatory Visit (HOSPITAL_BASED_OUTPATIENT_CLINIC_OR_DEPARTMENT_OTHER): Payer: Self-pay

## 2022-10-02 MED ORDER — FLUTICASONE PROPIONATE 50 MCG/ACT NA SUSP
1.0000 | Freq: Two times a day (BID) | NASAL | 2 refills | Status: DC
  Filled 2022-10-02 – 2022-10-03 (×2): qty 48, 90d supply, fill #0
  Filled 2023-03-18: qty 48, 90d supply, fill #1
  Filled 2023-06-11: qty 48, 90d supply, fill #2

## 2022-10-05 ENCOUNTER — Other Ambulatory Visit: Payer: Self-pay

## 2022-10-08 ENCOUNTER — Other Ambulatory Visit: Payer: Self-pay

## 2022-10-08 ENCOUNTER — Other Ambulatory Visit (HOSPITAL_COMMUNITY): Payer: Self-pay

## 2022-10-08 DIAGNOSIS — G5601 Carpal tunnel syndrome, right upper limb: Secondary | ICD-10-CM | POA: Diagnosis not present

## 2022-10-08 DIAGNOSIS — J301 Allergic rhinitis due to pollen: Secondary | ICD-10-CM | POA: Diagnosis not present

## 2022-10-08 DIAGNOSIS — M65341 Trigger finger, right ring finger: Secondary | ICD-10-CM | POA: Diagnosis not present

## 2022-10-08 DIAGNOSIS — J3089 Other allergic rhinitis: Secondary | ICD-10-CM | POA: Diagnosis not present

## 2022-10-08 DIAGNOSIS — J3081 Allergic rhinitis due to animal (cat) (dog) hair and dander: Secondary | ICD-10-CM | POA: Diagnosis not present

## 2022-10-08 MED ORDER — EPINEPHRINE 0.3 MG/0.3ML IJ SOAJ
INTRAMUSCULAR | 1 refills | Status: AC
  Filled 2022-10-08: qty 2, 30d supply, fill #0

## 2022-10-14 DIAGNOSIS — J3089 Other allergic rhinitis: Secondary | ICD-10-CM | POA: Diagnosis not present

## 2022-10-14 DIAGNOSIS — G5601 Carpal tunnel syndrome, right upper limb: Secondary | ICD-10-CM | POA: Diagnosis not present

## 2022-10-14 DIAGNOSIS — J3081 Allergic rhinitis due to animal (cat) (dog) hair and dander: Secondary | ICD-10-CM | POA: Diagnosis not present

## 2022-10-14 DIAGNOSIS — J301 Allergic rhinitis due to pollen: Secondary | ICD-10-CM | POA: Diagnosis not present

## 2022-10-15 ENCOUNTER — Other Ambulatory Visit: Payer: Self-pay

## 2022-10-15 ENCOUNTER — Other Ambulatory Visit (HOSPITAL_COMMUNITY): Payer: Self-pay

## 2022-10-15 MED ORDER — MONTELUKAST SODIUM 10 MG PO TABS
10.0000 mg | ORAL_TABLET | Freq: Every evening | ORAL | 20 refills | Status: DC
  Filled 2022-10-15: qty 90, 90d supply, fill #0
  Filled 2023-01-20: qty 90, 90d supply, fill #1
  Filled 2023-04-15 – 2023-04-19 (×2): qty 90, 90d supply, fill #2
  Filled 2023-07-14: qty 90, 90d supply, fill #3
  Filled 2023-10-14: qty 90, 90d supply, fill #4

## 2022-10-23 ENCOUNTER — Other Ambulatory Visit: Payer: Self-pay | Admitting: Oncology

## 2022-10-23 DIAGNOSIS — Z1379 Encounter for other screening for genetic and chromosomal anomalies: Secondary | ICD-10-CM

## 2022-10-29 DIAGNOSIS — J301 Allergic rhinitis due to pollen: Secondary | ICD-10-CM | POA: Diagnosis not present

## 2022-10-29 DIAGNOSIS — J3081 Allergic rhinitis due to animal (cat) (dog) hair and dander: Secondary | ICD-10-CM | POA: Diagnosis not present

## 2022-10-29 DIAGNOSIS — J3089 Other allergic rhinitis: Secondary | ICD-10-CM | POA: Diagnosis not present

## 2022-11-16 ENCOUNTER — Other Ambulatory Visit: Payer: Self-pay

## 2022-11-16 ENCOUNTER — Other Ambulatory Visit (HOSPITAL_COMMUNITY): Payer: Self-pay

## 2022-11-16 ENCOUNTER — Other Ambulatory Visit: Payer: Self-pay | Admitting: Medical

## 2022-11-16 MED ORDER — ONDANSETRON HCL 4 MG PO TABS
4.0000 mg | ORAL_TABLET | Freq: Three times a day (TID) | ORAL | 0 refills | Status: DC | PRN
  Filled 2022-11-16: qty 30, 10d supply, fill #0

## 2022-11-16 MED ORDER — BREXPIPRAZOLE 2 MG PO TABS
2.0000 mg | ORAL_TABLET | Freq: Every day | ORAL | 2 refills | Status: DC
  Filled 2022-11-16: qty 30, 30d supply, fill #0
  Filled 2022-12-16: qty 30, 30d supply, fill #1
  Filled 2023-01-20: qty 30, 30d supply, fill #2

## 2022-11-19 DIAGNOSIS — J301 Allergic rhinitis due to pollen: Secondary | ICD-10-CM | POA: Diagnosis not present

## 2022-11-19 DIAGNOSIS — J3089 Other allergic rhinitis: Secondary | ICD-10-CM | POA: Diagnosis not present

## 2022-11-19 DIAGNOSIS — J3081 Allergic rhinitis due to animal (cat) (dog) hair and dander: Secondary | ICD-10-CM | POA: Diagnosis not present

## 2022-11-24 ENCOUNTER — Other Ambulatory Visit: Payer: Self-pay

## 2022-11-24 ENCOUNTER — Other Ambulatory Visit (HOSPITAL_COMMUNITY): Payer: Self-pay

## 2022-11-24 DIAGNOSIS — N951 Menopausal and female climacteric states: Secondary | ICD-10-CM | POA: Diagnosis not present

## 2022-11-24 DIAGNOSIS — Z1389 Encounter for screening for other disorder: Secondary | ICD-10-CM | POA: Diagnosis not present

## 2022-11-24 DIAGNOSIS — Z1151 Encounter for screening for human papillomavirus (HPV): Secondary | ICD-10-CM | POA: Diagnosis not present

## 2022-11-24 DIAGNOSIS — Z124 Encounter for screening for malignant neoplasm of cervix: Secondary | ICD-10-CM | POA: Diagnosis not present

## 2022-11-24 DIAGNOSIS — Z01419 Encounter for gynecological examination (general) (routine) without abnormal findings: Secondary | ICD-10-CM | POA: Diagnosis not present

## 2022-11-24 MED ORDER — ESTRADIOL 0.1 MG/24HR TD PTTW
1.0000 | MEDICATED_PATCH | TRANSDERMAL | 4 refills | Status: DC
  Filled 2022-11-24 – 2023-02-19 (×3): qty 24, 84d supply, fill #0
  Filled 2023-05-16: qty 24, 84d supply, fill #1
  Filled 2023-07-20: qty 24, 84d supply, fill #2
  Filled 2023-10-27: qty 24, 84d supply, fill #3

## 2022-11-26 DIAGNOSIS — J301 Allergic rhinitis due to pollen: Secondary | ICD-10-CM | POA: Diagnosis not present

## 2022-11-26 DIAGNOSIS — J3081 Allergic rhinitis due to animal (cat) (dog) hair and dander: Secondary | ICD-10-CM | POA: Diagnosis not present

## 2022-11-26 DIAGNOSIS — J3089 Other allergic rhinitis: Secondary | ICD-10-CM | POA: Diagnosis not present

## 2022-11-27 ENCOUNTER — Encounter: Payer: Self-pay | Admitting: Internal Medicine

## 2022-11-28 ENCOUNTER — Other Ambulatory Visit (HOSPITAL_COMMUNITY): Payer: Self-pay

## 2022-11-30 ENCOUNTER — Other Ambulatory Visit: Payer: Self-pay

## 2022-12-01 ENCOUNTER — Telehealth: Payer: Self-pay | Admitting: Medical

## 2022-12-01 NOTE — Telephone Encounter (Signed)
Medical Records received from [facility]

## 2022-12-02 DIAGNOSIS — J301 Allergic rhinitis due to pollen: Secondary | ICD-10-CM | POA: Diagnosis not present

## 2022-12-02 DIAGNOSIS — J3089 Other allergic rhinitis: Secondary | ICD-10-CM | POA: Diagnosis not present

## 2022-12-02 DIAGNOSIS — J3081 Allergic rhinitis due to animal (cat) (dog) hair and dander: Secondary | ICD-10-CM | POA: Diagnosis not present

## 2022-12-02 NOTE — Progress Notes (Signed)
[facility] - Clinic Note  12/15/2022     CHIEF COMPLAINT Patient presents for Retina Follow Up   HISTORY OF PRESENT ILLNESS: Michelle Griffin is a 52 y.o. female who presents to the clinic today for:   HPI     Retina Follow Up   Patient presents with  Other.  In left eye.  This started 9 months ago.  I, the attending physician,  performed the HPI with the patient and updated documentation appropriately.        Comments   Patient here for 9 months retina follow up for retina Hem OS. Patient states vision doing fine. No eye pain. Uses allergy drops prn Astelin.      Last edited by Rennis Chris, MD on 12/15/2022 11:20 AM.     Pt states she is not having any problems with her vision, she checks her BP at home and it is always normal, she is not on any BP meds   Referring physician: Sinda Du, MD [address] [place],  [place] [address]  HISTORICAL INFORMATION:   Selected notes from the MEDICAL RECORD NUMBER Referred by Dr. Cathey Endow for retinal hemorrhages OS LEE:  Ocular Hx- PMH-    CURRENT MEDICATIONS: Current Outpatient Medications (Ophthalmic Drugs)  Medication Sig   azelastine (OPTIVAR) 0.05 % ophthalmic solution Instill 1 drop into the affected eye(s) twice daily as directed   prednisoLONE acetate (PRED FORTE) 1 % ophthalmic suspension Place 1 drop into right eyes  4 times a day for 1 week, then twice a day for 1 week. (Patient not taking: Reported on 12/15/2022)   No current facility-administered medications for this visit. (Ophthalmic Drugs)   Current Outpatient Medications (Other)  Medication Sig   albuterol (VENTOLIN HFA) 108 (90 Base) MCG/ACT inhaler Inhale 1-2 puffs into the lungs every 4-6 hours as needed for cough or wheeze   Azelastine HCl 137 MCG/SPRAY SOLN Place 1-2 sprays into both nostrils 2 (two) times daily.   Blood Pressure Monitoring (OMRON 3 SERIES BP MONITOR) DEVI Use as directed   brexpiprazole (REXULTI) 2 MG TABS tablet Take  1 tablet (2 mg total) by mouth daily.   EPINEPHrine 0.3 mg/0.3 mL IJ SOAJ injection Inject as directed into the muscle as needed for severe allergic reaction   EPINEPHrine 0.3 mg/0.3 mL IJ SOAJ injection Inject as directed as needed for systemic reaction   Ergocalciferol (VITAMIN D2) 50 MCG (2000 UT) TABS Take 1 tablet by mouth daily.   estradiol (VIVELLE-DOT) 0.1 MG/24HR patch APPLY 1 PATCH ONTO SKIN 2 TIMES A WEEK   estradiol (VIVELLE-DOT) 0.1 MG/24HR patch Place 1 patch (0.1 mg total) onto the skin 2 (two) times a week.   fluticasone (FLONASE) 50 MCG/ACT nasal spray Place 1-2 sprays into both nostrils 1 (one) to 2 (two) times daily, as directed.   levocetirizine (XYZAL) 5 MG tablet Take 1 tablet (5 mg total) by mouth every evening.   montelukast (SINGULAIR) 10 MG tablet Take 1 tablet by mouth every evening   ondansetron (ZOFRAN) 4 MG tablet Take 1 tablet (4 mg total) by mouth every 8 (eight) hours as needed for nausea or vomiting.   pantoprazole (PROTONIX) 40 MG tablet Take 1 tablet (40 mg total) by mouth daily.   Probiotic Product (RESTORA) CAPS Take 1 capsule by mouth daily.   rosuvastatin (CRESTOR) 10 MG tablet Take 1 tablet (10 mg total) by mouth daily.   Semaglutide-Weight Management (WEGOVY) 1 MG/0.5ML SOAJ Inject 1 mg into the  skin once a week.   sertraline (ZOLOFT) 100 MG tablet Take 1 tablet (100 mg total) by mouth daily.   traZODone (DESYREL) 50 MG tablet Take 1/2-1 tablet (25-50 mg total) by mouth at bedtime as needed for sleep.   No current facility-administered medications for this visit. (Other)   REVIEW OF SYSTEMS: ROS   Positive for: Cardiovascular, Eyes Negative for: Constitutional, Gastrointestinal, Neurological, Skin, Genitourinary, Musculoskeletal, HENT, Endocrine, Respiratory, Psychiatric, Allergic/Imm, Heme/Lymph Last edited by Laddie Aquas, COA on 12/15/2022  7:44 AM.       ALLERGIES Allergies  Allergen Reactions   Sulfa Antibiotics Hives   Ambien  [Zolpidem]     Nightmares/vivid dreams   Percocet [Oxycodone-Acetaminophen] Itching    Oxycodone " makes me climb the wall, itching"   PAST MEDICAL HISTORY Past Medical History:  Diagnosis Date   Adenocarcinoma in situ of cervix    Allergy    sees Dr. Pickens Callas   Anemia    hx/o iron deficiency due to heavy periods   Asthma    exercise induced   Bursitis    Cancer ([facility])    cervical   COVID-19 05/26/2019   fatigue, h/a, loss of taste and smell, fatige x 1 month, loss of taste x 5 months all symptoms rsolved   DDD (degenerative disc disease), lumbar    Fatty liver    GERD (gastroesophageal reflux disease)    Dr. Ewing Schlein   H/O mammogram    Hiatal hernia    small per EGD, Dr. Ewing Schlein   Hyperlipidemia 2012   was on pravastatin prior   Migraine    prior eval with neurologist, Dr. Vela Prose   Overactive bladder    PMDD (premenstrual dysphoric disorder)    Pyelonephritis    age 31yo   Uterine infection 2015   Vitamin D deficiency    Wears glasses    Past Surgical History:  Procedure Laterality Date   CERVICAL CONIZATION W/BX N/A 03/05/2020   Procedure: CONIZATION CERVIX WITH BIOPSY;  Surgeon: Huel Cote, MD;  Location: [facility];  Service: Gynecology;  Laterality: N/A;   CHOLECYSTECTOMY  2011   [facility]   COLONOSCOPY  2014   repeat 5 years; Dr. Ewing Schlein, baseline   CYSTOSCOPY N/A 03/20/2020   Procedure: CYSTOSCOPY;  Surgeon: Huel Cote, MD;  Location: [facility];  Service: Gynecology;  Laterality: N/A;   ESOPHAGOGASTRODUODENOSCOPY  2014   Dr. Ewing Schlein   FOOT SURGERY Left 2011   morton's neuroma   LAPAROSCOPIC VAGINAL HYSTERECTOMY WITH SALPINGECTOMY Bilateral 03/20/2020   Procedure: LAPAROSCOPIC ASSISTED VAGINAL HYSTERECTOMY WITH SALPINGECTOMY;  Surgeon: Huel Cote, MD;  Location: [facility];  Service: Gynecology;  Laterality: Bilateral;   LASIK  yrs ago   Dr. Renato Battles   LUMBAR DISC SURGERY  07/2013, [facility]   due to herniated disc, Dr. Ova Freshwater ABLATION  2015   WISDOM TOOTH EXTRACTION  age 26   FAMILY HISTORY Family History  Problem Relation Age of Onset   Cataracts Mother    Lupus Mother    Hypertension Mother    Hyperlipidemia Mother    Migraines Mother    Kidney disease Mother    Hypertension Father    Diabetes Father    Hyperlipidemia Father    Depression Father    Anxiety disorder Father    Dementia Father    Obesity Sister    Hypertension Sister    Diabetes Sister    Ovarian cysts Daughter  Heart disease Paternal Uncle        CABG   Hyperlipidemia Paternal Uncle    Cancer Paternal Uncle        multiple with colon cancer   Aplastic anemia Maternal Grandfather    AAA (abdominal aortic aneurysm) Maternal Grandfather    Cancer Cousin        breast   Cancer Cousin        ovarian   Stroke Neg Hx    SOCIAL HISTORY Social History   Tobacco Use   Smoking status: Never   Smokeless tobacco: Never  Vaping Use   Vaping status: Never Used  Substance Use Topics   Alcohol use: Yes    Alcohol/week: 3.0 standard drinks of alcohol    Types: 3 Cans of beer per week    Comment: socially   Drug use: No       OPHTHALMIC EXAM:  Base Eye Exam     Visual Acuity (Snellen - Linear)       Right Left   Dist [place] 20/20 20/20         Tonometry (Tonopen, 7:41 AM)       Right Left   Pressure 17 16         Pupils       Dark Light Shape React APD   Right 3 2 Round Brisk None   Left 3 2 Round Brisk None         Visual Fields (Counting fingers)       Left Right    Full Full         Extraocular Movement       Right Left    Full, Ortho Full, Ortho         Neuro/Psych     Oriented x3: Yes   Mood/Affect: Normal         Dilation     Both eyes: 1.0% Mydriacyl, 2.5% Phenylephrine @ 7:41 AM           Slit Lamp and Fundus Exam     Slit Lamp Exam       Right Left   Lids/Lashes Dermatochalasis - upper lid Dermatochalasis -  upper lid   Conjunctiva/Sclera White and quiet White and quiet   Cornea trace PEE, well healed lasik flap well healed lasik flap, trace tear film debris   Anterior Chamber deep and clear deep and clear   Iris Round and dilated Round and dilated   Lens Clear Clear   Anterior Vitreous clear mild syneresis         Fundus Exam       Right Left   Disc Pink and Sharp Pink and Sharp, mild PPP   C/D Ratio 0.3 0.3   Macula Flat, Good foveal reflex, No heme or edema Flat, Good foveal reflex, RPE mottling, No heme or edema   Vessels mild attenuation, mild copper wiring, mild tortuosity mild attenuation, mild copper wiring, mild tortuosity, mild AV crossing changes, Telangiectasia off ST venules in superior quad, blot heme at 0200   Periphery Attached, No heme Attached, attenuated and tortuous venules / +telangectasia just outside ST arcades with interval improvement in DBH, isolated blot heme at 0200           IMAGING AND PROCEDURES  Imaging and Procedures for 12/15/2022  OCT, Retina - OU - Both Eyes       Right Eye Quality was good. Central Foveal Thickness: 271. Progression has been stable. Findings include normal foveal contour,  no IRF, no SRF, vitreomacular adhesion .   Left Eye Quality was good. Central Foveal Thickness: 270. Progression has been stable. Findings include normal foveal contour, no IRF, no SRF, vitreomacular adhesion .   Notes *Images captured and stored on drive  Diagnosis / Impression:  NFP, no IRF/SRF OU  Clinical management:  See below  Abbreviations: NFP - Normal foveal profile. CME - cystoid macular edema. PED - pigment epithelial detachment. IRF - intraretinal fluid. SRF - subretinal fluid. EZ - ellipsoid zone. ERM - epiretinal membrane. ORA - outer retinal atrophy. ORT - outer retinal tubulation. SRHM - subretinal hyper-reflective material. IRHM - intraretinal hyper-reflective material      Color Fundus Photography Optos - OU - Both Eyes        Right Eye Progression has been stable. Disc findings include normal observations. Macula : normal observations. Vessels : normal observations. Periphery : normal observations.   Left Eye Progression has improved. Disc findings include normal observations. Macula : normal observations. Vessels : tortuous vessels (Focal attenuation, tortuosity and telangectasias superior midzone just outside ST arcades). Periphery : hemorrhage (Interval improvement in DBH, isolated blot heme at 0200).   Notes **Images stored on drive**  Impression: OS:   Focal cluster of MA DBH + vascular attenuation, tortuosity and telangectasias superior midzone just outside ST arcades, Interval improvement in DBH--just isolated blot heme at 0200           ASSESSMENT/PLAN:    ICD-10-CM   1. Retinal hemorrhage of left eye  H35.62 OCT, Retina - OU - Both Eyes    Color Fundus Photography Optos - OU - Both Eyes    2. Hypertensive retinopathy of both eyes  H35.033     3. Hx of LASIK  Z98.890      1. Retinal hemorrhages OS  - mild MA DBH superior periphery -- improved - incidental finding on dilated exam (4.14.23) w/ Dr. Cathey Endow  - today, BCVA 20/20 OU and pt asymptomatic  - exam shows attenuated and tortuous venules just outside ST arcades amidst the retinal hemorrhages -- appearance suggestive of mild remote BRVO  - pt denies history of HTN, DM2, blood clots  - is on [facility] for weight loss and estradiol patch for hormone replacement post hysterectomy (hx of cervical ca)  - OCT w/o retinal edema  - FA 4.18.23 without leakage or vasculitis  - repeat Optos color and FAF images obtained today -- show interval improvement in peripheral DBH  - no retinal or ophthalmic interventions indicated or recommended  - monitor  - f/u in 1 year, sooner prn -- DFE/OCT, repeat optos colors / FAF, possible FA  2. Hypertensive retinopathy OU  - pt does not carry formal diagnosis of HTN, and is not on any BP meds - discussed  importance of tight BP control - recommend home monitoring of BP w/ home cuff - continue to monitor  3. History of LASIK OU  - stable  Ophthalmic Meds Ordered this visit:  No orders of the defined types were placed in this encounter.    Return in about 1 year (around 12/15/2023) for f/u retinal hemes OS, DFE, OCT, OPTOS colors / FAF, ossible FA.  There are no Patient Instructions on file for this visit.   Explained the diagnoses, plan, and follow up with the patient and they expressed understanding.  Patient expressed understanding of the importance of proper follow up care.   This document serves as a record of services personally performed by Karie Chimera,  MD, PhD. It was created on their behalf by Gerilyn Nestle, COT an ophthalmic technician. The creation of this record is the provider's dictation and/or activities during the visit.    Electronically signed by:  Charlette Caffey, COT  12/15/22 11:21 AM  This document serves as a record of services personally performed by Karie Chimera, MD, PhD. It was created on their behalf by Glee Arvin. Manson Passey, OA an ophthalmic technician. The creation of this record is the provider's dictation and/or activities during the visit.    Electronically signed by: Glee Arvin. Manson Passey, OA 12/15/22 11:21 AM  Karie Chimera, M.D., Ph.D. Diseases & Surgery of the Retina and Vitreous [facility]  I have reviewed the above documentation for accuracy and completeness, and I agree with the above. Karie Chimera, M.D., Ph.D. 12/15/22 11:23 AM  Abbreviations: M myopia (nearsighted); A astigmatism; H hyperopia (farsighted); P presbyopia; Mrx spectacle prescription;  CTL contact lenses; OD right eye; OS left eye; OU both eyes  XT exotropia; ET esotropia; PEK punctate epithelial keratitis; PEE punctate epithelial erosions; DES dry eye syndrome; MGD meibomian gland dysfunction; ATs artificial tears; PFAT's preservative free artificial  tears; NSC nuclear sclerotic cataract; PSC posterior subcapsular cataract; ERM epi-retinal membrane; PVD posterior vitreous detachment; RD retinal detachment; DM diabetes mellitus; DR diabetic retinopathy; NPDR non-proliferative diabetic retinopathy; PDR proliferative diabetic retinopathy; CSME clinically significant macular edema; DME diabetic macular edema; dbh dot blot hemorrhages; CWS cotton wool spot; POAG primary open angle glaucoma; C/D cup-to-disc ratio; HVF humphrey visual field; GVF goldmann visual field; OCT optical coherence tomography; IOP intraocular pressure; BRVO Branch retinal vein occlusion; CRVO central retinal vein occlusion; CRAO central retinal artery occlusion; BRAO branch retinal artery occlusion; RT retinal tear; SB scleral buckle; PPV pars plana vitrectomy; VH Vitreous hemorrhage; PRP panretinal laser photocoagulation; IVK intravitreal kenalog; VMT vitreomacular traction; MH Macular hole;  NVD neovascularization of the disc; NVE neovascularization elsewhere; AREDS age related eye disease study; ARMD age related macular degeneration; POAG primary open angle glaucoma; EBMD epithelial/anterior basement membrane dystrophy; ACIOL anterior chamber intraocular lens; IOL intraocular lens; PCIOL posterior chamber intraocular lens; Phaco/IOL phacoemulsification with intraocular lens placement; PRK photorefractive keratectomy; LASIK laser assisted in situ keratomileusis; HTN hypertension; DM diabetes mellitus; COPD chronic obstructive pulmonary disease

## 2022-12-08 ENCOUNTER — Other Ambulatory Visit (HOSPITAL_COMMUNITY): Payer: Self-pay

## 2022-12-08 ENCOUNTER — Other Ambulatory Visit: Payer: Self-pay

## 2022-12-08 ENCOUNTER — Other Ambulatory Visit: Payer: Self-pay | Admitting: Medical

## 2022-12-08 DIAGNOSIS — J301 Allergic rhinitis due to pollen: Secondary | ICD-10-CM | POA: Diagnosis not present

## 2022-12-08 MED ORDER — SERTRALINE HCL 100 MG PO TABS
100.0000 mg | ORAL_TABLET | Freq: Every day | ORAL | 1 refills | Status: DC
  Filled 2022-12-08: qty 90, 90d supply, fill #0
  Filled 2023-03-10: qty 90, 90d supply, fill #1

## 2022-12-09 DIAGNOSIS — J3089 Other allergic rhinitis: Secondary | ICD-10-CM | POA: Diagnosis not present

## 2022-12-09 DIAGNOSIS — J3081 Allergic rhinitis due to animal (cat) (dog) hair and dander: Secondary | ICD-10-CM | POA: Diagnosis not present

## 2022-12-10 ENCOUNTER — Telehealth: Payer: Self-pay | Admitting: Internal Medicine

## 2022-12-10 NOTE — Telephone Encounter (Signed)
Pt wants to know if you can send in contrave and see if this will be covered by insurance since wegovy isn't

## 2022-12-11 ENCOUNTER — Encounter (INDEPENDENT_AMBULATORY_CARE_PROVIDER_SITE_OTHER): Payer: 59 | Admitting: Ophthalmology

## 2022-12-11 DIAGNOSIS — J3081 Allergic rhinitis due to animal (cat) (dog) hair and dander: Secondary | ICD-10-CM | POA: Diagnosis not present

## 2022-12-11 DIAGNOSIS — J3089 Other allergic rhinitis: Secondary | ICD-10-CM | POA: Diagnosis not present

## 2022-12-11 DIAGNOSIS — J4520 Mild intermittent asthma, uncomplicated: Secondary | ICD-10-CM | POA: Diagnosis not present

## 2022-12-11 DIAGNOSIS — H1045 Other chronic allergic conjunctivitis: Secondary | ICD-10-CM | POA: Diagnosis not present

## 2022-12-11 DIAGNOSIS — J301 Allergic rhinitis due to pollen: Secondary | ICD-10-CM | POA: Diagnosis not present

## 2022-12-11 NOTE — Telephone Encounter (Signed)
Pt wants to know is there anything else she can take for weight loss

## 2022-12-15 ENCOUNTER — Encounter (INDEPENDENT_AMBULATORY_CARE_PROVIDER_SITE_OTHER): Payer: Self-pay | Admitting: Ophthalmology

## 2022-12-15 ENCOUNTER — Ambulatory Visit (INDEPENDENT_AMBULATORY_CARE_PROVIDER_SITE_OTHER): Payer: 59 | Admitting: Ophthalmology

## 2022-12-15 DIAGNOSIS — J3081 Allergic rhinitis due to animal (cat) (dog) hair and dander: Secondary | ICD-10-CM | POA: Diagnosis not present

## 2022-12-15 DIAGNOSIS — H35033 Hypertensive retinopathy, bilateral: Secondary | ICD-10-CM

## 2022-12-15 DIAGNOSIS — H3562 Retinal hemorrhage, left eye: Secondary | ICD-10-CM | POA: Diagnosis not present

## 2022-12-15 DIAGNOSIS — J301 Allergic rhinitis due to pollen: Secondary | ICD-10-CM | POA: Diagnosis not present

## 2022-12-15 DIAGNOSIS — I10 Essential (primary) hypertension: Secondary | ICD-10-CM

## 2022-12-15 DIAGNOSIS — Z9889 Other specified postprocedural states: Secondary | ICD-10-CM

## 2022-12-15 DIAGNOSIS — J3089 Other allergic rhinitis: Secondary | ICD-10-CM | POA: Diagnosis not present

## 2022-12-15 NOTE — Telephone Encounter (Signed)
Pt states that she is put back on 20 pounds and will go on whatever you think will work best

## 2022-12-16 ENCOUNTER — Other Ambulatory Visit (HOSPITAL_COMMUNITY): Payer: Self-pay

## 2022-12-16 ENCOUNTER — Other Ambulatory Visit: Payer: Self-pay | Admitting: Medical

## 2022-12-16 ENCOUNTER — Other Ambulatory Visit: Payer: Self-pay

## 2022-12-16 MED ORDER — QSYMIA 7.5-46 MG PO CP24
1.0000 | ORAL_CAPSULE | ORAL | 0 refills | Status: DC
  Filled 2022-12-16 – 2022-12-18 (×2): qty 30, 30d supply, fill #0

## 2022-12-16 MED ORDER — ROSUVASTATIN CALCIUM 10 MG PO TABS
10.0000 mg | ORAL_TABLET | Freq: Every day | ORAL | 0 refills | Status: DC
  Filled 2022-12-16: qty 90, 90d supply, fill #0

## 2022-12-16 NOTE — Telephone Encounter (Signed)
Pt was notified.  

## 2022-12-17 ENCOUNTER — Other Ambulatory Visit: Payer: Self-pay

## 2022-12-17 ENCOUNTER — Other Ambulatory Visit (HOSPITAL_COMMUNITY): Payer: Self-pay

## 2022-12-18 ENCOUNTER — Other Ambulatory Visit (HOSPITAL_COMMUNITY): Payer: Self-pay

## 2022-12-18 ENCOUNTER — Other Ambulatory Visit: Payer: Self-pay | Admitting: Medical

## 2022-12-18 MED ORDER — TOPIRAMATE 25 MG PO TABS
25.0000 mg | ORAL_TABLET | Freq: Every day | ORAL | 1 refills | Status: DC
  Filled 2022-12-18: qty 30, 30d supply, fill #0
  Filled 2023-02-22: qty 30, 30d supply, fill #1

## 2022-12-18 MED ORDER — PHENTERMINE HCL 37.5 MG PO TABS
ORAL_TABLET | ORAL | 1 refills | Status: DC
  Filled 2022-12-18: qty 30, 30d supply, fill #0
  Filled 2023-02-16: qty 30, 30d supply, fill #1

## 2022-12-18 NOTE — Telephone Encounter (Signed)
Pt was notified.  

## 2022-12-18 NOTE — Telephone Encounter (Signed)
Per Elizaveta - pharmacy said to split the rx into 2 rx to be cheaper if not qysmia will need a preauth and is $100 a month.

## 2022-12-19 ENCOUNTER — Other Ambulatory Visit (HOSPITAL_COMMUNITY): Payer: Self-pay

## 2022-12-21 ENCOUNTER — Other Ambulatory Visit (HOSPITAL_COMMUNITY): Payer: Self-pay

## 2022-12-21 ENCOUNTER — Other Ambulatory Visit: Payer: Self-pay

## 2022-12-22 ENCOUNTER — Ambulatory Visit: Payer: 59 | Admitting: Medical

## 2022-12-22 ENCOUNTER — Other Ambulatory Visit (HOSPITAL_COMMUNITY): Payer: Self-pay

## 2022-12-22 VITALS — BP 120/78 | HR 100 | Temp 98.0°F | Wt 211.0 lb

## 2022-12-22 DIAGNOSIS — H9202 Otalgia, left ear: Secondary | ICD-10-CM

## 2022-12-22 DIAGNOSIS — J309 Allergic rhinitis, unspecified: Secondary | ICD-10-CM

## 2022-12-22 DIAGNOSIS — J301 Allergic rhinitis due to pollen: Secondary | ICD-10-CM | POA: Diagnosis not present

## 2022-12-22 DIAGNOSIS — J3081 Allergic rhinitis due to animal (cat) (dog) hair and dander: Secondary | ICD-10-CM | POA: Diagnosis not present

## 2022-12-22 DIAGNOSIS — J3089 Other allergic rhinitis: Secondary | ICD-10-CM | POA: Diagnosis not present

## 2022-12-22 DIAGNOSIS — H9312 Tinnitus, left ear: Secondary | ICD-10-CM

## 2022-12-22 MED ORDER — AMOXICILLIN 875 MG PO TABS
875.0000 mg | ORAL_TABLET | Freq: Two times a day (BID) | ORAL | 0 refills | Status: AC
  Filled 2022-12-22 (×2): qty 20, 10d supply, fill #0

## 2022-12-22 NOTE — Progress Notes (Signed)
Subjective:  Michelle Griffin is a 52 y.o. female who presents for Chief Complaint  Patient presents with   Ear Pain    Ear pain and ringing in ear- x couples. Waking her up at night.      Here for pain and ringing in left ear for a few weeks, constant dull toothache like pain and behind ear.   No fever, no cough, no other new URI symptoms other than her typical sneezing and congestion from allergies.  She already takes her regular allergy medication, both oral, nasal and allergy shots weekly.  No recent vertigo or dizziness  No recent swimming.   No loud noise exposure. No recent trauma to face or ear.  Ringing is intermittent, and not as bad since pain started.  No warmth or redness. No recent neck pain, neck tension, or wry neck.   No other aggravating or relieving factors.    No other c/o.  Past Medical History:  Diagnosis Date   Adenocarcinoma in situ of cervix    Allergy    sees Dr. [place] Callas   Anemia    hx/o iron deficiency due to heavy periods   Asthma    exercise induced   Bursitis    Cancer (HCC)    cervical   COVID-19 05/26/2019   fatigue, h/a, loss of taste and smell, fatige x 1 month, loss of taste x 5 months all symptoms rsolved   DDD (degenerative disc disease), lumbar    Fatty liver    GERD (gastroesophageal reflux disease)    Dr. Ewing Schlein   H/O mammogram    Hiatal hernia    small per EGD, Dr. Ewing Schlein   Hyperlipidemia 2012   was on pravastatin prior   Migraine    prior eval with neurologist, Dr. Vela Prose   Overactive bladder    PMDD (premenstrual dysphoric disorder)    Pyelonephritis    age 34yo   Uterine infection 2015   Vitamin D deficiency    Wears glasses    Current Outpatient Medications on File Prior to Visit  Medication Sig Dispense Refill   albuterol (VENTOLIN HFA) 108 (90 Base) MCG/ACT inhaler Inhale 1-2 puffs into the lungs every 4-6 hours as needed for cough or wheeze 6.7 g 2   azelastine (OPTIVAR) 0.05 % ophthalmic solution Instill 1 drop into the  affected eye(s) twice daily as directed 6 mL 3   Azelastine HCl 137 MCG/SPRAY SOLN Place 1-2 sprays into both nostrils 2 (two) times daily. 90 mL 3   Blood Pressure Monitoring (OMRON 3 SERIES BP MONITOR) DEVI Use as directed 1 each 0   brexpiprazole (REXULTI) 2 MG TABS tablet Take 1 tablet (2 mg total) by mouth daily. 30 tablet 2   EPINEPHrine 0.3 mg/0.3 mL IJ SOAJ injection Inject as directed as needed for systemic reaction 2 each 1   Ergocalciferol (VITAMIN D2) 50 MCG (2000 UT) TABS Take 1 tablet by mouth daily. 90 tablet 3   estradiol (VIVELLE-DOT) 0.1 MG/24HR patch Place 1 patch (0.1 mg total) onto the skin 2 (two) times a week. 24 patch 4   fluticasone (FLONASE) 50 MCG/ACT nasal spray Place 1-2 sprays into both nostrils 1 (one) to 2 (two) times daily, as directed. 48 g 2   levocetirizine (XYZAL) 5 MG tablet Take 1 tablet (5 mg total) by mouth every evening. 90 tablet 1   montelukast (SINGULAIR) 10 MG tablet Take 1 tablet by mouth every evening 90 tablet 20   ondansetron (ZOFRAN) 4 MG tablet Take  1 tablet (4 mg total) by mouth every 8 (eight) hours as needed for nausea or vomiting. 30 tablet 0   pantoprazole (PROTONIX) 40 MG tablet Take 1 tablet (40 mg total) by mouth daily. 90 tablet 1   prednisoLONE acetate (PRED FORTE) 1 % ophthalmic suspension Place 1 drop into right eyes  4 times a day for 1 week, then twice a day for 1 week. 10 mL 0   Probiotic Product (RESTORA) CAPS Take 1 capsule by mouth daily. 90 capsule 3   rosuvastatin (CRESTOR) 10 MG tablet Take 1 tablet (10 mg total) by mouth daily. 90 tablet 0   sertraline (ZOLOFT) 100 MG tablet Take 1 tablet (100 mg total) by mouth daily. 90 tablet 1   traZODone (DESYREL) 50 MG tablet Take 1/2-1 tablet (25-50 mg total) by mouth at bedtime as needed for sleep. 90 tablet 3   phentermine (ADIPEX-P) 37.5 MG tablet Take 1/2-1 tablet by mouth daily (Patient not taking: Reported on 12/22/2022) 30 tablet 1   topiramate (TOPAMAX) 25 MG tablet Take 1  tablet (25 mg total) by mouth at bedtime. (Patient not taking: Reported on 12/22/2022) 30 tablet 1   No current facility-administered medications on file prior to visit.     The following portions of the patient's history were reviewed and updated as appropriate: allergies, current medications, past family history, past medical history, past social history, past surgical history and problem list.  ROS Otherwise as in subjective above  Objective: BP 120/78   Pulse 100   Temp 98 F (36.7 C)   Wt 211 lb (95.7 kg)   LMP 03/16/2015   BMI 30.28 kg/m   General appearance: alert, no distress, well developed, well nourished HEENT: normocephalic, sclerae anicteric, conjunctiva pink and moist, TMs pearly, nares patent, tender somewhat over left mastoid, otherwise no discharge or erythema, pharynx normal Oral cavity: MMM, no lesions Neck: supple, nontender, normal ROM, no lymphadenopathy, no thyromegaly, no masses    Assessment: Encounter Diagnoses  Name Primary?   Left ear pain Yes   Mastoid pain, left    Chronic allergic rhinitis    Tinnitus of left ear      Plan: We discussed possible causes.  She is mildly tender over the mastoid region but no obvious specific etiology.  Begin Aleve once or twice a day for a few days.  If that does not seem to be helping along with heat and stretching, then begin amoxicillin and the chance there is eustachian tube dysfunction or mastoid infection.  Continue routine allergy medication.  Consider short-term Sudafed for a few days for decongestion as well.  We discussed remedies to help with tinnitus aggravation.  Hearing test normal today.   [name] was seen today for ear pain.  Diagnoses and all orders for this visit:  Left ear pain  Mastoid pain, left  Chronic allergic rhinitis  Tinnitus of left ear  Other orders -     amoxicillin (AMOXIL) 875 MG tablet; Take 1 tablet (875 mg total) by mouth 2 (two) times daily for 10 days.    Follow  up: prn

## 2023-01-12 DIAGNOSIS — J3089 Other allergic rhinitis: Secondary | ICD-10-CM | POA: Diagnosis not present

## 2023-01-12 DIAGNOSIS — J3081 Allergic rhinitis due to animal (cat) (dog) hair and dander: Secondary | ICD-10-CM | POA: Diagnosis not present

## 2023-01-12 DIAGNOSIS — J301 Allergic rhinitis due to pollen: Secondary | ICD-10-CM | POA: Diagnosis not present

## 2023-01-20 ENCOUNTER — Other Ambulatory Visit (HOSPITAL_COMMUNITY): Payer: Self-pay

## 2023-01-22 ENCOUNTER — Encounter: Payer: Self-pay | Admitting: Medical

## 2023-01-22 ENCOUNTER — Ambulatory Visit (INDEPENDENT_AMBULATORY_CARE_PROVIDER_SITE_OTHER): Payer: 59 | Admitting: Medical

## 2023-01-22 ENCOUNTER — Other Ambulatory Visit: Payer: Self-pay

## 2023-01-22 VITALS — BP 120/70 | HR 94 | Ht 70.0 in | Wt 218.6 lb

## 2023-01-22 DIAGNOSIS — G4700 Insomnia, unspecified: Secondary | ICD-10-CM

## 2023-01-22 DIAGNOSIS — F419 Anxiety disorder, unspecified: Secondary | ICD-10-CM

## 2023-01-22 DIAGNOSIS — Z23 Encounter for immunization: Secondary | ICD-10-CM | POA: Diagnosis not present

## 2023-01-22 DIAGNOSIS — Z7185 Encounter for immunization safety counseling: Secondary | ICD-10-CM | POA: Diagnosis not present

## 2023-01-22 DIAGNOSIS — Z0000 Encounter for general adult medical examination without abnormal findings: Secondary | ICD-10-CM

## 2023-01-22 DIAGNOSIS — J4530 Mild persistent asthma, uncomplicated: Secondary | ICD-10-CM

## 2023-01-22 DIAGNOSIS — E785 Hyperlipidemia, unspecified: Secondary | ICD-10-CM

## 2023-01-22 DIAGNOSIS — Z131 Encounter for screening for diabetes mellitus: Secondary | ICD-10-CM | POA: Diagnosis not present

## 2023-01-22 DIAGNOSIS — R7301 Impaired fasting glucose: Secondary | ICD-10-CM | POA: Diagnosis not present

## 2023-01-22 DIAGNOSIS — J301 Allergic rhinitis due to pollen: Secondary | ICD-10-CM | POA: Diagnosis not present

## 2023-01-22 DIAGNOSIS — J3081 Allergic rhinitis due to animal (cat) (dog) hair and dander: Secondary | ICD-10-CM | POA: Diagnosis not present

## 2023-01-22 DIAGNOSIS — Z79899 Other long term (current) drug therapy: Secondary | ICD-10-CM

## 2023-01-22 DIAGNOSIS — K219 Gastro-esophageal reflux disease without esophagitis: Secondary | ICD-10-CM

## 2023-01-22 DIAGNOSIS — E559 Vitamin D deficiency, unspecified: Secondary | ICD-10-CM

## 2023-01-22 DIAGNOSIS — Z90710 Acquired absence of both cervix and uterus: Secondary | ICD-10-CM | POA: Diagnosis not present

## 2023-01-22 DIAGNOSIS — Z13220 Encounter for screening for lipoid disorders: Secondary | ICD-10-CM

## 2023-01-22 DIAGNOSIS — J3089 Other allergic rhinitis: Secondary | ICD-10-CM | POA: Diagnosis not present

## 2023-01-22 LAB — COMPREHENSIVE METABOLIC PANEL: Calcium: 9.9 mg/dL (ref 8.7–10.2)

## 2023-01-22 MED ORDER — CYCLOBENZAPRINE HCL 5 MG PO TABS: 5.0000 mg | ORAL_TABLET | Freq: Every evening | ORAL | 0 refills | Status: DC | PRN

## 2023-01-22 MED ORDER — PREDNISONE 10 MG PO TABS
ORAL_TABLET | ORAL | 0 refills | Status: DC
  Filled 2023-01-22: qty 21, fill #0

## 2023-01-22 MED ORDER — CYCLOBENZAPRINE HCL 5 MG PO TABS
5.0000 mg | ORAL_TABLET | Freq: Every evening | ORAL | 0 refills | Status: DC | PRN
  Filled 2023-01-22: qty 12, 12d supply, fill #0

## 2023-01-22 MED ORDER — PREDNISONE 10 MG PO TABS: ORAL_TABLET | ORAL | 0 refills | Status: DC

## 2023-01-22 NOTE — Progress Notes (Signed)
Subjective:   HPI  Michelle Griffin is a 52 y.o. female who presents for Chief Complaint  Patient presents with   Annual Exam    Fasting cpe, neck pain- would like a steriod dose pack and muscle relaxer    Patient Care Team: Kobe Ofallon, Cleda Mccreedy as PCP - [facility], [facility] Sees dentist Sees eye doctor Dr. Vida Rigger, GI Dr. Huel Cote, [facility] Dr. Sidney Ace, allergist   Concerns: I saw her recently for mastoid pain, neck pain and headache.  She still has some similar symptoms and thinks is more neck pain at this point.  She feels tension in the neck, tightness in her upper back and neck.  Feels like she carries stress in an area.  Compliant with medicines in general.  Still taking medication to help with weight management  Reviewed their medical, surgical, family, social, medication, and allergy history and updated chart as appropriate.   Past Medical History:  Diagnosis Date   Adenocarcinoma in situ of cervix    Allergy    sees Dr. [place] Callas   Anemia    hx/o iron deficiency due to heavy periods   Asthma    exercise induced   Bursitis    Cancer (HCC)    cervical   COVID-19 05/26/2019   fatigue, h/a, loss of taste and smell, fatige x 1 month, loss of taste x 5 months all symptoms rsolved   DDD (degenerative disc disease), lumbar    Fatty liver    GERD (gastroesophageal reflux disease)    Dr. Ewing Schlein   H/O mammogram    Hiatal hernia    small per EGD, Dr. Ewing Schlein   Hyperlipidemia 2012   was on pravastatin prior   Migraine    prior eval with neurologist, Dr. Vela Prose   Overactive bladder    PMDD (premenstrual dysphoric disorder)    Pyelonephritis    age 75yo   Uterine infection 2015   Vitamin D deficiency    Wears glasses     Family History  Problem Relation Age of Onset   Cataracts Mother    Lupus Mother    Hypertension Mother    Hyperlipidemia Mother    Migraines Mother    Kidney disease Mother     Hypertension Father    Diabetes Father    Hyperlipidemia Father    Depression Father    Anxiety disorder Father    Dementia Father    Obesity Sister    Hypertension Sister    Diabetes Sister    Ovarian cysts Daughter    Heart disease Paternal Uncle        CABG   Hyperlipidemia Paternal Uncle    Cancer Paternal Uncle        multiple with colon cancer   Aplastic anemia Maternal Grandfather    AAA (abdominal aortic aneurysm) Maternal Grandfather    Cancer Cousin        breast   Cancer Cousin        ovarian   Stroke Neg Hx      Current Outpatient Medications:    albuterol (VENTOLIN HFA) 108 (90 Base) MCG/ACT inhaler, Inhale 1-2 puffs into the lungs every 4-6 hours as needed for cough or wheeze, Disp: 6.7 g, Rfl: 2   azelastine (OPTIVAR) 0.05 % ophthalmic solution, Instill 1 drop into the affected eye(s) twice daily as directed, Disp: 6 mL, Rfl: 3   Azelastine HCl 137 MCG/SPRAY SOLN, Place 1-2 sprays into both nostrils 2 (two)  times daily., Disp: 90 mL, Rfl: 3   brexpiprazole (REXULTI) 2 MG TABS tablet, Take 1 tablet (2 mg total) by mouth daily., Disp: 30 tablet, Rfl: 2   EPINEPHrine 0.3 mg/0.3 mL IJ SOAJ injection, Inject as directed as needed for systemic reaction, Disp: 2 each, Rfl: 1   Ergocalciferol (VITAMIN D2) 50 MCG (2000 UT) TABS, Take 1 tablet by mouth daily., Disp: 90 tablet, Rfl: 3   estradiol (VIVELLE-DOT) 0.1 MG/24HR patch, Place 1 patch (0.1 mg total) onto the skin 2 (two) times a week., Disp: 24 patch, Rfl: 4   fluticasone (FLONASE) 50 MCG/ACT nasal spray, Place 1-2 sprays into both nostrils 1 (one) to 2 (two) times daily, as directed., Disp: 48 g, Rfl: 2   levocetirizine (XYZAL) 5 MG tablet, Take 1 tablet (5 mg total) by mouth every evening., Disp: 90 tablet, Rfl: 1   montelukast (SINGULAIR) 10 MG tablet, Take 1 tablet by mouth every evening, Disp: 90 tablet, Rfl: 20   ondansetron (ZOFRAN) 4 MG tablet, Take 1 tablet (4 mg total) by mouth every 8 (eight) hours as needed  for nausea or vomiting., Disp: 30 tablet, Rfl: 0   pantoprazole (PROTONIX) 40 MG tablet, Take 1 tablet (40 mg total) by mouth daily., Disp: 90 tablet, Rfl: 1   phentermine (ADIPEX-P) 37.5 MG tablet, Take 1/2-1 tablet by mouth daily, Disp: 30 tablet, Rfl: 1   Probiotic Product (RESTORA) CAPS, Take 1 capsule by mouth daily., Disp: 90 capsule, Rfl: 3   rosuvastatin (CRESTOR) 10 MG tablet, Take 1 tablet (10 mg total) by mouth daily., Disp: 90 tablet, Rfl: 0   sertraline (ZOLOFT) 100 MG tablet, Take 1 tablet (100 mg total) by mouth daily., Disp: 90 tablet, Rfl: 1   topiramate (TOPAMAX) 25 MG tablet, Take 1 tablet (25 mg total) by mouth at bedtime., Disp: 30 tablet, Rfl: 1   traZODone (DESYREL) 50 MG tablet, Take 1/2-1 tablet (25-50 mg total) by mouth at bedtime as needed for sleep., Disp: 90 tablet, Rfl: 3   Blood Pressure Monitoring (OMRON 3 SERIES BP MONITOR) DEVI, Use as directed, Disp: 1 each, Rfl: 0   cyclobenzaprine (FLEXERIL) 5 MG tablet, Take 1 tablet (5 mg total) by mouth at bedtime as needed for muscle spasms., Disp: 12 tablet, Rfl: 0   predniSONE (DELTASONE) 10 MG tablet, 6 tablets all together day 1, 5 tablets day 2, 4 tablets day 3, 3 tablets day 4, 2 tablets day 5, 1 tablet day 6., Disp: 21 tablet, Rfl: 0  Allergies  Allergen Reactions   Sulfa Antibiotics Hives   Ambien [Zolpidem]     Nightmares/vivid dreams   Percocet [Oxycodone-Acetaminophen] Itching    Oxycodone " makes me climb the wall, itching"     Review of Systems  Constitutional:  Negative for chills, fever, malaise/fatigue and weight loss.  HENT:  Negative for congestion, ear pain, hearing loss, sore throat and tinnitus.   Eyes:  Negative for blurred vision, pain and redness.  Respiratory:  Negative for cough, hemoptysis and shortness of breath.   Cardiovascular:  Negative for chest pain, palpitations, orthopnea, claudication and leg swelling.  Gastrointestinal:  Negative for abdominal pain, blood in stool,  constipation, diarrhea, nausea and vomiting.  Genitourinary:  Negative for dysuria, flank pain, frequency, hematuria and urgency.  Musculoskeletal:  Positive for back pain and neck pain. Negative for falls, joint pain and myalgias.  Skin:  Negative for itching and rash.  Neurological:  Negative for dizziness, tingling, speech change, weakness and headaches.  Endo/Heme/Allergies:  Negative for polydipsia. Does not bruise/bleed easily.  Psychiatric/Behavioral:  Negative for depression and memory loss. The patient is not nervous/anxious and does not have insomnia.         07/06/2022   10:59 AM 01/20/2022   10:25 AM 08/28/2021    8:25 AM 04/30/2021    9:08 AM 01/15/2021    8:57 AM  Depression screen PHQ 2/9  Decreased Interest 0 0 0 0 0  Down, Depressed, Hopeless 0 0 0 0 0  PHQ - 2 Score 0 0 0 0 0       Objective:  BP 120/70   Pulse 94   Ht 5\' 10"  (1.778 m)   Wt 218 lb 9.6 oz (99.2 kg)   LMP 03/16/2015   BMI 31.37 kg/m    BP Readings from Last 3 Encounters:  01/22/23 120/70  12/22/22 120/78  08/19/22 120/70   Wt Readings from Last 3 Encounters:  01/22/23 218 lb 9.6 oz (99.2 kg)  12/22/22 211 lb (95.7 kg)  08/19/22 190 lb 6.4 oz (86.4 kg)     General appearance: alert, no distress, WD/WN, Caucasian female Skin: no worrisome lesions, tattoo left medial foot along the sole of the foot HEENT: normocephalic, conjunctiva/corneas normal, sclerae anicteric, PERRLA, EOMi, nares patent, no discharge or erythema, pharynx normal Oral cavity: MMM, tongue normal, teeth normal Neck: Mild tenderness to left lateral neck, but normal range of motion, otherwise supple, no lymphadenopathy, no thyromegaly, no masses, no bruits Chest: non tender, normal shape and expansion Heart: RRR, normal S1, S2, no murmurs Lungs: CTA bilaterally, no wheezes, rhonchi, or rales Abdomen: +bs, soft, non tender, non distended, no masses, no hepatomegaly, no splenomegaly, no bruits Back: normal ROM, no  scoliosis Musculoskeletal: Tender left upper back paraspinal and supraspinatus region, otherwise upper extremities non tender, no obvious deformity, normal ROM throughout, lower extremities non tender, no obvious deformity, normal ROM throughout Extremities: no edema, no cyanosis, no clubbing Pulses: 2+ symmetric, upper and lower extremities, normal cap refill Neurological: alert, oriented x 3, CN2-12 intact, strength normal upper extremities and lower extremities, sensation normal throughout, DTRs 2+ throughout, no cerebellar signs, gait normal Psychiatric: normal affect, behavior normal, pleasant  Breast/gyn - deferred to gynecology     Assessment and Plan :   Encounter Diagnoses  Name Primary?   Encounter for health maintenance examination in adult Yes   Vitamin D deficiency    Vaccine counseling    Screening for lipid disorders    Screening for diabetes mellitus    S/P hysterectomy    Insomnia, unspecified type    Impaired fasting blood sugar    Hyperlipidemia, unspecified hyperlipidemia type    High risk medication use    Gastroesophageal reflux disease, unspecified whether esophagitis present    Mild persistent asthma without complication    Anxiety    Need for shingles vaccine      This visit was a preventative care visit, also known as wellness visit or routine physical.   Topics typically include healthy lifestyle, diet, exercise, preventative care, vaccinations, sick and well care, proper use of emergency dept and after hours care, as well as other concerns.     Recommendations: Continue to return yearly for your annual wellness and preventative care visits.  This gives [name] a chance to discuss healthy lifestyle, exercise, vaccinations, review your chart record, and perform screenings where appropriate.  I recommend you see your eye doctor yearly for routine vision care.  I recommend you see your dentist yearly for routine  dental care including hygiene visits twice  yearly.  See your gynecologist yearly for routine gynecological care.   Vaccination recommendations were reviewed Immunization History  Administered Date(s) Administered   Hepatitis B 12/05/1973, 01/14/1994, 06/30/1994   Influenza Split 03/11/2015, 03/21/2021   Influenza, High Dose Seasonal PF 03/27/2019, 03/26/2020   Influenza-Unspecified 02/10/2004, 02/03/2013, 03/11/2015, 02/29/2016, 03/10/2016, 03/11/2017, 03/12/2017, 03/21/2018   MMR 09/16/1973, 06/11/2006   PFIZER(Purple Top)SARS-COV-2 Vaccination 07/28/2019, 08/18/2019, 03/15/2020, 03/26/2020   Pneumococcal Polysaccharide-23 02/17/2016, 11/05/2020   Rubella 12/05/1993, 12/05/1993   Td 06/01/1990, 05/12/2001, 06/16/2018   Tdap 06/14/2007, 09/04/2022   Tetanus 06/01/2008   Zoster Recombinant(Shingrix) 01/22/2023   I recommend a yearly flu shot in the fall.  Counseled on the Shingrix vaccine.  Vaccine information sheet given. Shingrix #1 vaccine given after consent obtained.   Return in 2 months for Shingrix #2.   Screening for cancer: Colon cancer screening: I reviewed your colonoscopy on file that is up to date from 09/2020  Breast cancer screening: You should perform a self breast exam monthly.   We reviewed recommendations for regular mammograms and breast cancer screening.  Cervical cancer screening: We reviewed recommendations for pap smear screening.  S/p hysterectomy  Skin cancer screening: Check your skin regularly for new changes, growing lesions, or other lesions of concern Come in for evaluation if you have skin lesions of concern.  Lung cancer screening: If you have a greater than 20 pack year history of tobacco use, then you may qualify for lung cancer screening with a chest CT scan.   Please call your insurance company to inquire about coverage for this test.  We currently don't have screenings for other cancers besides breast, cervical, colon, and lung cancers.  If you have a strong family history of  cancer or have other cancer screening concerns, please let me know.    Bone health: Get at least 150 minutes of aerobic exercise weekly Get weight bearing exercise at least once weekly Bone density test:  A bone density test is an imaging test that uses a type of X-ray to measure the amount of calcium and other minerals in your bones. The test may be used to diagnose or screen you for a condition that causes weak or thin bones (osteoporosis), predict your risk for a broken bone (fracture), or determine how well your osteoporosis treatment is working. The bone density test is recommended for females 65 and older, or females or males <65 if certain risk factors such as thyroid disease, long term use of steroids such as for asthma or rheumatological issues, vitamin D deficiency, estrogen deficiency, family history of osteoporosis, self or family history of fragility fracture in first degree relative.  Advised baseline scan at 60.  However since she is on some high risk medication such as PPI and Topamax we may consider doing this sooner.    Heart health: Get at least 150 minutes of aerobic exercise weekly Limit alcohol It is important to maintain a healthy blood pressure and healthy cholesterol numbers  Heart disease screening: Screening for heart disease includes screening for blood pressure, fasting lipids, glucose/diabetes screening, BMI height to weight ratio, reviewed of smoking status, physical activity, and diet.    Goals include blood pressure 120/80 or less, maintaining a healthy lipid/cholesterol profile, preventing diabetes or keeping diabetes numbers under good control, not smoking or using tobacco products, exercising most days per week or at least 150 minutes per week of exercise, and eating healthy variety of fruits and vegetables, healthy oils, and  avoiding unhealthy food choices like fried food, fast food, high sugar and high cholesterol foods.    She reports a normal CT  coronary test back in 2020.  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  Back in 2020 she was a test patient when they were up starting the CT coronary test program.  She does not have a physical copy of the report and there is no dictated report since she was not officially seen as a patient but she notes the test was normal.  This was per Dr. Delton See, cardiology   Medical care options: I recommend you continue to seek care here first for routine care.  We try really hard to have available appointments Monday through Friday daytime hours for sick visits, acute visits, and physicals.  Urgent care should be used for after hours and weekends for significant issues that cannot wait till the next day.  The emergency department should be used for significant potentially life-threatening emergencies.  The emergency department is expensive, can often have long wait times for less significant concerns, so try to utilize primary care, urgent care, or telemedicine when possible to avoid unnecessary trips to the emergency department.  Virtual visits and telemedicine have been introduced since the pandemic started in 2020, and can be convenient ways to receive medical care.  We offer virtual appointments as well to assist you in a variety of options to seek medical care.    Separate significant issues discussed: Obesity-discussed need to continue efforts to lose weight through healthy diet and exercise, currently on Topamax and phentermine to help with weight management since insurance no longer covered GLP-1 medication which was working well for her.  Fatty liver disease- continue efforts with weight loss and low-fat low-cholesterol diet.    Hyperlipidemia- c/t statin Crestor 10 mg daily,  Labs today  Asthma-no recent problems  Vitamin D deficiency-continue supplement, labs today  Impaired glucose - labs today  Anxiety and depression-she continues on Rexulti, Zoloft  Chronic  insomnia-does fine on trazodone  GERD-on Protonix.  Discussed risk and benefits of medication.  Chronic allergies-she continues her allergy medicine as usual  Michelle Griffin was seen today for annual exam.  Diagnoses and all orders for this visit:  Encounter for health maintenance examination in adult -     Comprehensive metabolic panel -     CBC -     Lipid panel -     Hemoglobin A1c -     TSH -     VITAMIN D 25 Hydroxy (Vit-D Deficiency, Fractures)  Vitamin D deficiency -     VITAMIN D 25 Hydroxy (Vit-D Deficiency, Fractures)  Vaccine counseling  Screening for lipid disorders -     Lipid panel  Screening for diabetes mellitus -     Hemoglobin A1c  S/P hysterectomy  Insomnia, unspecified type  Impaired fasting blood sugar  Hyperlipidemia, unspecified hyperlipidemia type -     Lipid panel  High risk medication use  Gastroesophageal reflux disease, unspecified whether esophagitis present  Mild persistent asthma without complication  Anxiety  Need for shingles vaccine -     Zoster Recombinant (Shingrix )  Other orders -     Discontinue: cyclobenzaprine (FLEXERIL) 5 MG tablet; Take 1 tablet (5 mg total) by mouth at bedtime as needed for muscle spasms. -     Discontinue: predniSONE (DELTASONE) 10 MG tablet; 6 tablets all together day 1, 5 tablets day 2, 4 tablets day 3, 3 tablets day  4, 2 tablets day 5, 1 tablet day 6. -     predniSONE (DELTASONE) 10 MG tablet; 6 tablets all together day 1, 5 tablets day 2, 4 tablets day 3, 3 tablets day 4, 2 tablets day 5, 1 tablet day 6. -     cyclobenzaprine (FLEXERIL) 5 MG tablet; Take 1 tablet (5 mg total) by mouth at bedtime as needed for muscle spasms.    Follow-up pending labs, yearly for physical

## 2023-01-23 LAB — COMPREHENSIVE METABOLIC PANEL
ALT: 19 IU/L (ref 0–32)
AST: 28 IU/L (ref 0–40)
Albumin: 4.7 g/dL (ref 3.9–4.9)
Alkaline Phosphatase: 92 IU/L (ref 44–121)
BUN/Creatinine Ratio: 20 (ref 9–23)
BUN: 16 mg/dL (ref 6–24)
Bilirubin Total: 0.3 mg/dL (ref 0.0–1.2)
CO2: 25 mmol/L (ref 20–29)
Chloride: 104 mmol/L (ref 96–106)
Creatinine, Ser: 0.8 mg/dL (ref 0.57–1.00)
Globulin, Total: 2.7 g/dL (ref 1.5–4.5)
Glucose: 94 mg/dL (ref 70–99)
Potassium: 4.5 mmol/L (ref 3.5–5.2)
Sodium: 140 mmol/L (ref 134–144)
Total Protein: 7.4 g/dL (ref 6.0–8.5)
eGFR: 90 mL/min/{1.73_m2} (ref >59)

## 2023-01-23 LAB — LIPID PANEL
Chol/HDL Ratio: 1.9 ratio (ref 0.0–4.4)
Cholesterol, Total: 198 mg/dL (ref 100–199)
HDL: 105 mg/dL (ref >39)
LDL Chol Calc (NIH): 78 mg/dL (ref 0–99)
Triglycerides: 86 mg/dL (ref 0–149)
VLDL Cholesterol Cal: 15 mg/dL (ref 5–40)

## 2023-01-23 LAB — CBC
Hematocrit: 38.8 % (ref 34.0–46.6)
Hemoglobin: 11.7 g/dL (ref 11.1–15.9)
MCH: 22.5 pg — ABNORMAL LOW (ref 26.6–33.0)
MCHC: 30.2 g/dL — ABNORMAL LOW (ref 31.5–35.7)
MCV: 75 fL — ABNORMAL LOW (ref 79–97)
Platelets: 278 10*3/uL (ref 150–450)
RBC: 5.21 x10E6/uL (ref 3.77–5.28)
RDW: 16.9 % — ABNORMAL HIGH (ref 11.7–15.4)
WBC: 4.8 10*3/uL (ref 3.4–10.8)

## 2023-01-23 LAB — TSH: TSH: 1.62 u[IU]/mL (ref 0.450–4.500)

## 2023-01-23 LAB — VITAMIN D 25 HYDROXY (VIT D DEFICIENCY, FRACTURES): Vit D, 25-Hydroxy: 57.9 ng/mL (ref 30.0–100.0)

## 2023-01-23 LAB — HEMOGLOBIN A1C
Est. average glucose Bld gHb Est-mCnc: 120 mg/dL
Hgb A1c MFr Bld: 5.8 % — ABNORMAL HIGH (ref 4.8–5.6)

## 2023-01-24 NOTE — Progress Notes (Signed)
Results sent through MyChart

## 2023-02-08 ENCOUNTER — Other Ambulatory Visit (HOSPITAL_COMMUNITY): Payer: Self-pay

## 2023-02-08 DIAGNOSIS — J3089 Other allergic rhinitis: Secondary | ICD-10-CM | POA: Diagnosis not present

## 2023-02-08 DIAGNOSIS — J301 Allergic rhinitis due to pollen: Secondary | ICD-10-CM | POA: Diagnosis not present

## 2023-02-08 DIAGNOSIS — J3081 Allergic rhinitis due to animal (cat) (dog) hair and dander: Secondary | ICD-10-CM | POA: Diagnosis not present

## 2023-02-08 MED ORDER — FLULAVAL 0.5 ML IM SUSY
0.5000 mL | PREFILLED_SYRINGE | Freq: Once | INTRAMUSCULAR | 0 refills | Status: AC
  Filled 2023-02-08: qty 0.5, 1d supply, fill #0

## 2023-02-16 ENCOUNTER — Other Ambulatory Visit: Payer: Self-pay | Admitting: Medical

## 2023-02-16 ENCOUNTER — Other Ambulatory Visit (HOSPITAL_COMMUNITY): Payer: Self-pay

## 2023-02-16 MED ORDER — BREXPIPRAZOLE 2 MG PO TABS
2.0000 mg | ORAL_TABLET | Freq: Every day | ORAL | 5 refills | Status: DC
  Filled 2023-02-16: qty 30, 30d supply, fill #0
  Filled 2023-03-18: qty 30, 30d supply, fill #1
  Filled 2023-04-19: qty 30, 30d supply, fill #2
  Filled 2023-05-16: qty 30, 30d supply, fill #3
  Filled 2023-06-25: qty 30, 30d supply, fill #4
  Filled 2023-07-20: qty 30, 30d supply, fill #5

## 2023-02-17 ENCOUNTER — Other Ambulatory Visit (HOSPITAL_COMMUNITY): Payer: Self-pay

## 2023-02-17 ENCOUNTER — Other Ambulatory Visit: Payer: Self-pay

## 2023-02-19 ENCOUNTER — Other Ambulatory Visit (HOSPITAL_COMMUNITY): Payer: Self-pay

## 2023-02-19 DIAGNOSIS — J3081 Allergic rhinitis due to animal (cat) (dog) hair and dander: Secondary | ICD-10-CM | POA: Diagnosis not present

## 2023-02-19 DIAGNOSIS — J301 Allergic rhinitis due to pollen: Secondary | ICD-10-CM | POA: Diagnosis not present

## 2023-02-19 DIAGNOSIS — J3089 Other allergic rhinitis: Secondary | ICD-10-CM | POA: Diagnosis not present

## 2023-02-22 ENCOUNTER — Other Ambulatory Visit: Payer: Self-pay

## 2023-02-24 DIAGNOSIS — J3081 Allergic rhinitis due to animal (cat) (dog) hair and dander: Secondary | ICD-10-CM | POA: Diagnosis not present

## 2023-02-24 DIAGNOSIS — J3089 Other allergic rhinitis: Secondary | ICD-10-CM | POA: Diagnosis not present

## 2023-02-24 DIAGNOSIS — J301 Allergic rhinitis due to pollen: Secondary | ICD-10-CM | POA: Diagnosis not present

## 2023-02-25 ENCOUNTER — Other Ambulatory Visit: Payer: Self-pay

## 2023-02-25 ENCOUNTER — Ambulatory Visit: Payer: 59 | Admitting: Orthopedic Surgery

## 2023-02-25 DIAGNOSIS — M6701 Short Achilles tendon (acquired), right ankle: Secondary | ICD-10-CM | POA: Diagnosis not present

## 2023-02-25 DIAGNOSIS — M722 Plantar fascial fibromatosis: Secondary | ICD-10-CM

## 2023-02-25 DIAGNOSIS — M79671 Pain in right foot: Secondary | ICD-10-CM

## 2023-02-27 ENCOUNTER — Encounter: Payer: Self-pay | Admitting: Orthopedic Surgery

## 2023-02-27 NOTE — Progress Notes (Signed)
Office Visit Note   Patient: Michelle Griffin           Date of Birth: Jul 03, 1970           MRN: [id number] Visit Date: 02/25/2023              Requested by: Jac Canavan, PA-C [address] [place],  [place] [address] PCP: Jac Canavan, PA-C  Chief Complaint  Patient presents with   Right Foot - Pain      HPI: Patient is a 52 year old woman who is seen for initial evaluation for right heel pain.  Patient complains of pain with start up pain progressing over the past 2 to 4 weeks.  Patient states she has had a history of stress fractures in the past.  Assessment & Plan: Visit Diagnoses:  1. Pain of right heel   2. Contracture of right Achilles tendon   3. Plantar fasciitis of right foot     Plan: Patient was given instructions and demonstrated Achilles stretching.  Recommended a stiff soled sneaker with a cork arch support.  Patient may also use Voltaren gel.  Discussed that if she fails this treatment she could follow-up with Dr. Shon Baton for shockwave therapy evaluation.  Follow-Up Instructions: No follow-ups on file.   Ortho Exam  Patient is alert, oriented, no adenopathy, well-dressed, normal affect, normal respiratory effort. Examination patient has a palpable pulse with her knee extended she has dorsiflexion 20 degrees short of neutral with significant Achilles contracture there is tenderness to palpation of the origin of the plantar fascia.  Lateral compression of the calcaneus is nontender.  Tarsal tunnel is nontender.  Imaging: No results found. No images are attached to the encounter.  Labs: Lab Results  Component Value Date   HGBA1C 5.8 (H) 01/22/2023   HGBA1C 5.6 01/20/2022   HGBA1C 5.5 04/30/2021   REPTSTATUS 08/01/2015 FINAL 07/27/2015   CULT  07/27/2015    NO GROWTH 5 DAYS Performed at [facility]    LABORGA NO GROWTH 02/17/2016     Lab Results  Component Value Date   ALBUMIN 4.7 01/22/2023   ALBUMIN 4.1 08/04/2022   ALBUMIN  4.8 01/20/2022    No results found for: "MG" Lab Results  Component Value Date   VD25OH 57.9 01/22/2023   VD25OH 40.1 01/20/2022   VD25OH 30.1 11/05/2020    No results found for: "PREALBUMIN"    Latest Ref Rng & Units 01/22/2023    9:34 AM 08/04/2022   10:50 PM 01/20/2022   10:54 AM  CBC EXTENDED  WBC 3.4 - 10.8 x10E3/uL 4.8  12.5  8.4   RBC 3.77 - 5.28 x10E6/uL 5.21  4.40  4.87   Hemoglobin 11.1 - 15.9 g/dL 62.1  30.8  65.7   HCT 34.0 - 46.6 % 38.8  34.2  40.6   Platelets 150 - 450 x10E3/uL 278  249  244   NEUT# 1.7 - 7.7 K/uL  10.8    Lymph# 0.7 - 4.0 K/uL  0.9       There is no height or weight on file to calculate BMI.  Orders:  Orders Placed This Encounter  Procedures   XR Os Calcis Right   No orders of the defined types were placed in this encounter.    Procedures: No procedures performed  Clinical Data: No additional findings.  ROS:  All other systems negative, except as noted in the HPI. Review of Systems  Objective: Vital Signs: LMP 03/16/2015   Specialty  Comments:  No specialty comments available.  PMFS History: Patient Active Problem List   Diagnosis Date Noted   High risk medication use 04/30/2021   Anxiety 04/30/2021   Encounter for health maintenance examination in adult 11/05/2020   Vaccine counseling 11/05/2020   Need for pneumococcal vaccination 11/05/2020   Screening for diabetes mellitus 11/05/2020   Gastroesophageal reflux disease 05/29/2020   S/P hysterectomy 05/29/2020   S/P laparoscopic assisted vaginal hysterectomy (LAVH) 03/20/2020   Insomnia 09/08/2019   Need for Td vaccine 02/17/2016   Obesity with serious comorbidity 02/17/2016   Fatty liver 02/13/2015   Vitamin D deficiency 02/13/2015   PMDD (premenstrual dysphoric disorder) 02/13/2015   Hyperlipidemia 02/13/2015   OAB (overactive bladder) 02/13/2015   Rhinitis, allergic 02/13/2015   Asthma, mild persistent 02/13/2015   Screening for lipid disorders 02/13/2015    Impaired fasting blood sugar 02/13/2015   DEGENERATIVE DISC DISEASE, LUMBAR SPINE 11/13/2008   Past Medical History:  Diagnosis Date   Adenocarcinoma in situ of cervix    Allergy    sees Dr. [place] Callas   Anemia    hx/o iron deficiency due to heavy periods   Asthma    exercise induced   Bursitis    Cancer (HCC)    cervical   COVID-19 05/26/2019   fatigue, h/a, loss of taste and smell, fatige x 1 month, loss of taste x 5 months all symptoms rsolved   DDD (degenerative disc disease), lumbar    Fatty liver    GERD (gastroesophageal reflux disease)    Dr. Ewing Schlein   H/O mammogram    Hiatal hernia    small per EGD, Dr. Ewing Schlein   Hyperlipidemia 2012   was on pravastatin prior   Migraine    prior eval with neurologist, Dr. Vela Prose   Overactive bladder    PMDD (premenstrual dysphoric disorder)    Pyelonephritis    age 81yo   Uterine infection 2015   Vitamin D deficiency    Wears glasses     Family History  Problem Relation Age of Onset   Cataracts Mother    Lupus Mother    Hypertension Mother    Hyperlipidemia Mother    Migraines Mother    Kidney disease Mother    Hypertension Father    Diabetes Father    Hyperlipidemia Father    Depression Father    Anxiety disorder Father    Dementia Father    Obesity Sister    Hypertension Sister    Diabetes Sister    Ovarian cysts Daughter    Heart disease Paternal Uncle        CABG   Hyperlipidemia Paternal Uncle    Cancer Paternal Uncle        multiple with colon cancer   Aplastic anemia Maternal Grandfather    AAA (abdominal aortic aneurysm) Maternal Grandfather    Cancer Cousin        breast   Cancer Cousin        ovarian   Stroke Neg Hx     Past Surgical History:  Procedure Laterality Date   CERVICAL CONIZATION W/BX N/A 03/05/2020   Procedure: CONIZATION CERVIX WITH BIOPSY;  Surgeon: Huel Cote, MD;  Location: [facility];  Service: Gynecology;  Laterality: N/A;   CHOLECYSTECTOMY  2011   [facility]apa    COLONOSCOPY  2014   repeat 5 years; Dr. Ewing Schlein, baseline   CYSTOSCOPY N/A 03/20/2020   Procedure: CYSTOSCOPY;  Surgeon: Huel Cote, MD;  Location: [facility];  Service: Gynecology;  Laterality: N/A;   ESOPHAGOGASTRODUODENOSCOPY  2014   Dr. Ewing Schlein   FOOT SURGERY Left 2011   morton's neuroma   LAPAROSCOPIC VAGINAL HYSTERECTOMY WITH SALPINGECTOMY Bilateral 03/20/2020   Procedure: LAPAROSCOPIC ASSISTED VAGINAL HYSTERECTOMY WITH SALPINGECTOMY;  Surgeon: Huel Cote, MD;  Location: [facility];  Service: Gynecology;  Laterality: Bilateral;   LASIK  yrs ago   Dr. Renato Battles   LUMBAR DISC SURGERY  07/2013, [facility]   due to herniated disc, Dr. Ova Freshwater ABLATION  2015   WISDOM TOOTH EXTRACTION  age 4   Social History   Occupational History   Not on file  Tobacco Use   Smoking status: Never   Smokeless tobacco: Never  Vaping Use   Vaping status: Never Used  Substance and Sexual Activity   Alcohol use: Yes    Alcohol/week: 3.0 standard drinks of alcohol    Types: 3 Cans of beer per week    Comment: socially   Drug use: No   Sexual activity: Not Currently    Partners: Male    Comment: partner had vasectomy

## 2023-03-10 ENCOUNTER — Other Ambulatory Visit (HOSPITAL_COMMUNITY): Payer: Self-pay

## 2023-03-10 ENCOUNTER — Other Ambulatory Visit: Payer: Self-pay

## 2023-03-10 ENCOUNTER — Other Ambulatory Visit: Payer: Self-pay | Admitting: Medical

## 2023-03-10 MED ORDER — PANTOPRAZOLE SODIUM 40 MG PO TBEC
40.0000 mg | DELAYED_RELEASE_TABLET | Freq: Every day | ORAL | 1 refills | Status: DC
  Filled 2023-03-10: qty 90, 90d supply, fill #0
  Filled 2023-06-07: qty 90, 90d supply, fill #1

## 2023-03-12 DIAGNOSIS — J3089 Other allergic rhinitis: Secondary | ICD-10-CM | POA: Diagnosis not present

## 2023-03-12 DIAGNOSIS — J301 Allergic rhinitis due to pollen: Secondary | ICD-10-CM | POA: Diagnosis not present

## 2023-03-12 DIAGNOSIS — J3081 Allergic rhinitis due to animal (cat) (dog) hair and dander: Secondary | ICD-10-CM | POA: Diagnosis not present

## 2023-03-18 ENCOUNTER — Other Ambulatory Visit: Payer: Self-pay | Admitting: Medical

## 2023-03-18 ENCOUNTER — Other Ambulatory Visit: Payer: Self-pay

## 2023-03-18 ENCOUNTER — Other Ambulatory Visit (HOSPITAL_COMMUNITY): Payer: Self-pay

## 2023-03-18 MED ORDER — TOPIRAMATE 25 MG PO TABS
25.0000 mg | ORAL_TABLET | Freq: Every day | ORAL | 1 refills | Status: DC
  Filled 2023-03-18: qty 90, 90d supply, fill #0

## 2023-03-18 MED ORDER — ROSUVASTATIN CALCIUM 10 MG PO TABS
10.0000 mg | ORAL_TABLET | Freq: Every day | ORAL | 1 refills | Status: DC
  Filled 2023-03-18: qty 90, 90d supply, fill #0
  Filled 2023-06-23: qty 90, 90d supply, fill #1

## 2023-03-18 MED ORDER — LEVOCETIRIZINE DIHYDROCHLORIDE 5 MG PO TABS
5.0000 mg | ORAL_TABLET | Freq: Every evening | ORAL | 3 refills | Status: DC
  Filled 2023-03-18: qty 90, 90d supply, fill #0
  Filled 2023-06-11: qty 90, 90d supply, fill #1
  Filled 2023-09-14: qty 90, 90d supply, fill #2
  Filled 2023-12-14: qty 90, 90d supply, fill #3

## 2023-03-24 DIAGNOSIS — J3089 Other allergic rhinitis: Secondary | ICD-10-CM | POA: Diagnosis not present

## 2023-03-24 DIAGNOSIS — J301 Allergic rhinitis due to pollen: Secondary | ICD-10-CM | POA: Diagnosis not present

## 2023-03-24 DIAGNOSIS — J3081 Allergic rhinitis due to animal (cat) (dog) hair and dander: Secondary | ICD-10-CM | POA: Diagnosis not present

## 2023-04-01 ENCOUNTER — Telehealth (HOSPITAL_COMMUNITY): Payer: Self-pay | Admitting: *Deleted

## 2023-04-01 NOTE — Telephone Encounter (Signed)
Attempted to f/u with patient regarding discussion with MD to determine how to proceed with OP MBS order. Left VM with request for callback. AHARRIS

## 2023-04-02 ENCOUNTER — Other Ambulatory Visit
Admission: RE | Admit: 2023-04-02 | Discharge: 2023-04-02 | Disposition: A | Discharge status: Home / Self Care | Payer: 59 | Source: Ambulatory Visit | Attending: Oncology | Admitting: Oncology

## 2023-04-02 DIAGNOSIS — Z1379 Encounter for other screening for genetic and chromosomal anomalies: Secondary | ICD-10-CM | POA: Insufficient documentation

## 2023-04-07 DIAGNOSIS — J3089 Other allergic rhinitis: Secondary | ICD-10-CM | POA: Diagnosis not present

## 2023-04-07 DIAGNOSIS — J301 Allergic rhinitis due to pollen: Secondary | ICD-10-CM | POA: Diagnosis not present

## 2023-04-07 DIAGNOSIS — J3081 Allergic rhinitis due to animal (cat) (dog) hair and dander: Secondary | ICD-10-CM | POA: Diagnosis not present

## 2023-04-11 ENCOUNTER — Other Ambulatory Visit: Payer: 59

## 2023-04-11 ENCOUNTER — Other Ambulatory Visit: Payer: 59 | Attending: Oncology

## 2023-04-14 LAB — HELIX MOLECULAR SCREEN: Genetic Analysis Overall Interpretation: NEGATIVE

## 2023-04-14 LAB — GENECONNECT MOLECULAR SCREEN

## 2023-04-15 ENCOUNTER — Other Ambulatory Visit (HOSPITAL_COMMUNITY): Payer: Self-pay

## 2023-04-15 ENCOUNTER — Other Ambulatory Visit: Payer: Self-pay | Admitting: Medical

## 2023-04-15 ENCOUNTER — Other Ambulatory Visit: Payer: Self-pay

## 2023-04-15 MED ORDER — PHENTERMINE HCL 37.5 MG PO TABS
ORAL_TABLET | ORAL | 1 refills | Status: DC
  Filled 2023-04-15: qty 30, 30d supply, fill #0

## 2023-04-19 ENCOUNTER — Other Ambulatory Visit (HOSPITAL_COMMUNITY): Payer: Self-pay

## 2023-04-19 ENCOUNTER — Other Ambulatory Visit: Payer: Self-pay

## 2023-05-06 ENCOUNTER — Telehealth: Payer: 59 | Admitting: Family Medicine

## 2023-05-06 DIAGNOSIS — L03119 Cellulitis of unspecified part of limb: Secondary | ICD-10-CM

## 2023-05-06 MED ORDER — CEPHALEXIN 500 MG PO CAPS: 500.0000 mg | ORAL_CAPSULE | Freq: Three times a day (TID) | ORAL | 0 refills | Status: AC

## 2023-05-06 MED ORDER — FLUCONAZOLE 150 MG PO TABS: 150.0000 mg | ORAL_TABLET | Freq: Once | ORAL | 0 refills | Status: AC

## 2023-05-06 NOTE — Progress Notes (Signed)
Virtual Visit Consent   Michelle Griffin, you are scheduled for a virtual visit with a [facility] provider today. Just as with appointments in the office, your consent must be obtained to participate. Your consent will be active for this visit and any virtual visit you may have with one of our providers in the next 365 days. If you have a MyChart account, a copy of this consent can be sent to you electronically.  As this is a virtual visit, video technology does not allow for your provider to perform a traditional examination. This may limit your provider's ability to fully assess your condition. If your provider identifies any concerns that need to be evaluated in person or the need to arrange testing (such as labs, EKG, etc.), we will make arrangements to do so. Although advances in technology are sophisticated, we cannot ensure that it will always work on either your end or our end. If the connection with a video visit is poor, the visit may have to be switched to a telephone visit. With either a video or telephone visit, we are not always able to ensure that we have a secure connection.  By engaging in this virtual visit, you consent to the provision of healthcare and authorize for your insurance to be billed (if applicable) for the services provided during this visit. Depending on your insurance coverage, you may receive a charge related to this service.  I need to obtain your verbal consent now. Are you willing to proceed with your visit today? Michelle Griffin has provided verbal consent on 05/06/2023 for a virtual visit (video or telephone). Georgana Curio, FNP  Date: 05/06/2023 6:36 PM  Virtual Visit via Video Note   I, Georgana Curio, connected with  Michelle Griffin  ([id number], Nov 26, 1970) on 05/06/23 at  6:30 PM EST by a video-enabled telemedicine application and verified that I am speaking with the correct person using two identifiers.  Location: Patient: Home Provider: Virtual Visit Location Provider:  Home Office   I discussed the limitations of evaluation and management by telemedicine and the availability of in person appointments. The patient expressed understanding and agreed to proceed.    History of Present Illness: Michelle Griffin is a 52 y.o. who identifies as a female who was assigned female at birth, and is being seen today for .  HPI: HPI  Problems:  Patient Active Problem List   Diagnosis Date Noted   High risk medication use 04/30/2021   Anxiety 04/30/2021   Encounter for health maintenance examination in adult 11/05/2020   Vaccine counseling 11/05/2020   Need for pneumococcal vaccination 11/05/2020   Screening for diabetes mellitus 11/05/2020   Gastroesophageal reflux disease 05/29/2020   S/P hysterectomy 05/29/2020   S/P laparoscopic assisted vaginal hysterectomy (LAVH) 03/20/2020   Insomnia 09/08/2019   Need for Td vaccine 02/17/2016   Obesity with serious comorbidity 02/17/2016   Fatty liver 02/13/2015   Vitamin D deficiency 02/13/2015   PMDD (premenstrual dysphoric disorder) 02/13/2015   Hyperlipidemia 02/13/2015   OAB (overactive bladder) 02/13/2015   Rhinitis, allergic 02/13/2015   Asthma, mild persistent 02/13/2015   Screening for lipid disorders 02/13/2015   Impaired fasting blood sugar 02/13/2015   DEGENERATIVE DISC DISEASE, LUMBAR SPINE 11/13/2008    Allergies:  Allergies  Allergen Reactions   Sulfa Antibiotics Hives   Ambien [Zolpidem]     Nightmares/vivid dreams   Percocet [Oxycodone-Acetaminophen] Itching    Oxycodone " makes me climb the wall, itching"   Medications:  Current Outpatient Medications:    cephALEXin (KEFLEX) 500 MG capsule, Take 1 capsule (500 mg total) by mouth 3 (three) times daily for 7 days., Disp: 21 capsule, Rfl: 0   albuterol (VENTOLIN HFA) 108 (90 Base) MCG/ACT inhaler, Inhale 1-2 puffs into the lungs every 4-6 hours as needed for cough or wheeze, Disp: 6.7 g, Rfl: 2   azelastine (OPTIVAR) 0.05 % ophthalmic solution,  Instill 1 drop into the affected eye(s) twice daily as directed, Disp: 6 mL, Rfl: 3   Azelastine HCl 137 MCG/SPRAY SOLN, Place 1-2 sprays into both nostrils 2 (two) times daily., Disp: 90 mL, Rfl: 3   Blood Pressure Monitoring (OMRON 3 SERIES BP MONITOR) DEVI, Use as directed, Disp: 1 each, Rfl: 0   brexpiprazole (REXULTI) 2 MG TABS tablet, Take 1 tablet (2 mg total) by mouth daily., Disp: 30 tablet, Rfl: 5   cyclobenzaprine (FLEXERIL) 5 MG tablet, Take 1 tablet (5 mg total) by mouth at bedtime as needed for muscle spasms., Disp: 12 tablet, Rfl: 0   EPINEPHrine 0.3 mg/0.3 mL IJ SOAJ injection, Inject as directed as needed for systemic reaction, Disp: 2 each, Rfl: 1   Ergocalciferol (VITAMIN D2) 50 MCG (2000 UT) TABS, Take 1 tablet by mouth daily., Disp: 90 tablet, Rfl: 3   estradiol (VIVELLE-DOT) 0.1 MG/24HR patch, Place 1 patch (0.1 mg total) onto the skin 2 (two) times a week., Disp: 24 patch, Rfl: 4   fluticasone (FLONASE) 50 MCG/ACT nasal spray, Place 1-2 sprays into both nostrils 1 (one) to 2 (two) times daily, as directed., Disp: 48 g, Rfl: 2   levocetirizine (XYZAL) 5 MG tablet, Take 1 tablet (5 mg total) by mouth every evening., Disp: 90 tablet, Rfl: 3   montelukast (SINGULAIR) 10 MG tablet, Take 1 tablet by mouth every evening, Disp: 90 tablet, Rfl: 20   ondansetron (ZOFRAN) 4 MG tablet, Take 1 tablet (4 mg total) by mouth every 8 (eight) hours as needed for nausea or vomiting., Disp: 30 tablet, Rfl: 0   pantoprazole (PROTONIX) 40 MG tablet, Take 1 tablet (40 mg total) by mouth daily., Disp: 90 tablet, Rfl: 1   phentermine (ADIPEX-P) 37.5 MG tablet, Take 1/2-1 tablet by mouth daily, Disp: 30 tablet, Rfl: 1   predniSONE (DELTASONE) 10 MG tablet, 6 tablets all together day 1, 5 tablets day 2, 4 tablets day 3, 3 tablets day 4, 2 tablets day 5, 1 tablet day 6., Disp: 21 tablet, Rfl: 0   Probiotic Product (RESTORA) CAPS, Take 1 capsule by mouth daily., Disp: 90 capsule, Rfl: 3   rosuvastatin  (CRESTOR) 10 MG tablet, Take 1 tablet (10 mg total) by mouth daily., Disp: 90 tablet, Rfl: 1   sertraline (ZOLOFT) 100 MG tablet, Take 1 tablet (100 mg total) by mouth daily., Disp: 90 tablet, Rfl: 1   topiramate (TOPAMAX) 25 MG tablet, Take 1 tablet (25 mg total) by mouth at bedtime., Disp: 90 tablet, Rfl: 1   traZODone (DESYREL) 50 MG tablet, Take 1/2-1 tablet (25-50 mg total) by mouth at bedtime as needed for sleep., Disp: 90 tablet, Rfl: 3  Observations/Objective: Patient is well-developed, well-nourished in no acute distress.  Resting comfortably  at home.  Head is normocephalic, atraumatic.  No labored breathing.  Speech is clear and coherent with logical content.  Patient is alert and oriented at baseline.  Scabbed area posterior left knee with erythema surrounding with warmth.   Assessment and Plan: 1. Cellulitis of lower extremity, unspecified laterality  Keep wound clean and dry.  UC if sx persist or worsen.   Follow Up Instructions: I discussed the assessment and treatment plan with the patient. The patient was provided an opportunity to ask questions and all were answered. The patient agreed with the plan and demonstrated an understanding of the instructions.  A copy of instructions were sent to the patient via MyChart unless otherwise noted below.     The patient was advised to call back or seek an in-person evaluation if the symptoms worsen or if the condition fails to improve as anticipated.    Georgana Curio, FNP

## 2023-05-06 NOTE — Patient Instructions (Signed)

## 2023-05-17 ENCOUNTER — Other Ambulatory Visit: Payer: Self-pay

## 2023-05-17 DIAGNOSIS — M79671 Pain in right foot: Secondary | ICD-10-CM

## 2023-05-19 DIAGNOSIS — G5602 Carpal tunnel syndrome, left upper limb: Secondary | ICD-10-CM | POA: Diagnosis not present

## 2023-06-01 ENCOUNTER — Other Ambulatory Visit: Payer: Self-pay | Admitting: Medical

## 2023-06-01 ENCOUNTER — Other Ambulatory Visit: Payer: Self-pay

## 2023-06-03 ENCOUNTER — Other Ambulatory Visit: Payer: Self-pay

## 2023-06-03 ENCOUNTER — Encounter: Payer: Self-pay | Admitting: Sports Medicine

## 2023-06-03 ENCOUNTER — Ambulatory Visit: Payer: 59 | Admitting: Sports Medicine

## 2023-06-03 DIAGNOSIS — M722 Plantar fascial fibromatosis: Secondary | ICD-10-CM

## 2023-06-03 DIAGNOSIS — Q6671 Congenital pes cavus, right foot: Secondary | ICD-10-CM | POA: Diagnosis not present

## 2023-06-03 MED ORDER — SERTRALINE HCL 100 MG PO TABS
100.0000 mg | ORAL_TABLET | Freq: Every day | ORAL | 0 refills | Status: DC
  Filled 2023-06-03: qty 90, 90d supply, fill #0

## 2023-06-03 MED ORDER — TRAZODONE HCL 50 MG PO TABS
25.0000 mg | ORAL_TABLET | Freq: Every evening | ORAL | 0 refills | Status: DC | PRN
  Filled 2023-06-03: qty 90, 90d supply, fill #0

## 2023-06-03 MED ORDER — ONDANSETRON HCL 4 MG PO TABS
4.0000 mg | ORAL_TABLET | Freq: Three times a day (TID) | ORAL | 0 refills | Status: DC | PRN
  Filled 2023-06-03: qty 90, 30d supply, fill #0

## 2023-06-03 NOTE — Progress Notes (Signed)
 Patient says that she is having pain in the arch of her right foot. She says that she has had injections for plantar fasciitis in the past but does not want to do those again. She is going to [place]  in March and is hoping her foot will feel better as she will be walking a lot while there. She says she does home exercises/stretches and rolls her foot on a frozen water bottle. She has had a flare up of this pain beginning again about 6 months ago.

## 2023-06-03 NOTE — Progress Notes (Signed)
 Michelle Griffin - 53 y.o. female MRN [id number]  Date of birth: March 30, 1971  Office Visit Note: Visit Date: 06/03/2023 PCP: Bulah Alm RAMAN, PA-C Referred by: Harden Jerona GAILS, MD  Subjective: Chief Complaint  Patient presents with   Right Foot - Pain   HPI: Michelle Griffin is a pleasant 53 y.o. female who presents today for right foot pain and plantar fascia pain.  Lane has been having pain in the arch of the foot near the plantar fascia for the past 5-6 months.  She has had plantar fascia release in the past and has done better with steroid injections, but she is hoping to avoid this at this time.  She Has Been Doing Achilles Stretches and Some HEP as well as frozen water bottle with only mild relief.  Does use NSAIDs as needed but not much relief.   Pertinent ROS were reviewed with the patient and found to be negative unless otherwise specified above in HPI.   Assessment & Plan: Visit Diagnoses:  1. Plantar fasciitis of right foot   2. Pes cavus of right foot    Plan: Impression is exacerbation of chronic right foot plan fasciitis in the setting of pes cavus foot shape.  She is performing stretching and home rehab, I did add the 'towel scrunch' exercise to her regimen and she will begin at home for plantar fascia strengthening/stability.  We did fit her for orthotic insoles to help support her longitudinal arch, she found this to be very comfortable.  We did proceed with a trial of extracorporeal shockwave therapy to the plantar fascia.  I would like to perform at least 2-3 sessions and see what sort of cumulative benefit she has going forward.  She will follow-up in the next 1-2 weeks for repeat evaluation and additional shockwave.  May continue her ice as well as over-the-counter anti-inflammatories only as needed for pain control.  Follow-up: Return in about 10 days (around 06/13/2023) for f/u b/w 1-2 weeks for R-PF (reg visit).   Meds & Orders: No orders of the defined types were placed  in this encounter.  No orders of the defined types were placed in this encounter.    Procedures: Procedure: ECSWT Indications: Plantar fasciitis   Procedure Details Consent: Risks of procedure as well as the alternatives and risks of each were explained to the patient.  Verbal consent for procedure obtained. Time Out: Verified patient identification, verified procedure, site was marked, verified correct patient position. The area was cleaned with alcohol swab.     The right plantar fascia was targeted for Extracorporeal shockwave therapy.    Preset: Plantar fasciitis Power Level: 90 mJ Frequency: 10 Hz Impulse/cycles: 3000 Head size: Regular   Patient tolerated procedure well without immediate complications.         Clinical History: No specialty comments available.  She reports that she has never smoked. She has never used smokeless tobacco.  Recent Labs    01/22/23 0934  HGBA1C 5.8*    Objective:   Vital Signs: LMP 03/16/2015   Physical Exam  Gen: Well-appearing, in no acute distress; non-toxic CV: Well-perfused. Warm.  Resp: Breathing unlabored on room air; no wheezing. Psych: Fluid speech in conversation; appropriate affect; normal thought process  Ortho Exam - Right foot: + TTP over the medial aspect of the calcaneus near the plantar fascia insertion that extends mildly into the arch of the foot.  Negative calcaneal heel squeeze.  No redness swelling or effusion here.  - Bilateral  feet/gait: There is notable pes cavus of the feet, the right is preserved but the left does have mild to moderate loss of longitudinal arch upon standing.   Imaging:  - 02/25/23: 2 views of the right calcaneus were independently reviewed interpreted by myself including AP and lateral film.  There is calcaneal spurring with a small Haglund as well as enthesophytes near the insertional Achilles.  Past Medical/Family/Surgical/Social History: Medications & Allergies reviewed per EMR, new  medications updated. Patient Active Problem List   Diagnosis Date Noted   High risk medication use 04/30/2021   Anxiety 04/30/2021   Encounter for health maintenance examination in adult 11/05/2020   Vaccine counseling 11/05/2020   Need for pneumococcal vaccination 11/05/2020   Screening for diabetes mellitus 11/05/2020   Gastroesophageal reflux disease 05/29/2020   S/P hysterectomy 05/29/2020   S/P laparoscopic assisted vaginal hysterectomy (LAVH) 03/20/2020   Insomnia 09/08/2019   Need for Td vaccine 02/17/2016   Obesity with serious comorbidity 02/17/2016   Fatty liver 02/13/2015   Vitamin D  deficiency 02/13/2015   PMDD (premenstrual dysphoric disorder) 02/13/2015   Hyperlipidemia 02/13/2015   OAB (overactive bladder) 02/13/2015   Rhinitis, allergic 02/13/2015   Asthma, mild persistent 02/13/2015   Screening for lipid disorders 02/13/2015   Impaired fasting blood sugar 02/13/2015   DEGENERATIVE DISC DISEASE, LUMBAR SPINE 11/13/2008   Past Medical History:  Diagnosis Date   Adenocarcinoma in situ of cervix    Allergy    sees Dr. Frutoso   Anemia    hx/o iron  deficiency due to heavy periods   Asthma    exercise induced   Bursitis    Cancer (HCC)    cervical   COVID-19 05/26/2019   fatigue, h/a, loss of taste and smell, fatige x 1 month, loss of taste x 5 months all symptoms rsolved   DDD (degenerative disc disease), lumbar    Fatty liver    GERD (gastroesophageal reflux disease)    Dr. Rosalie   H/O mammogram    Hiatal hernia    small per EGD, Dr. Rosalie   Hyperlipidemia 2012   was on pravastatin  prior   Migraine    prior eval with neurologist, Dr. Lindy   Overactive bladder    PMDD (premenstrual dysphoric disorder)    Pyelonephritis    age 11yo   Uterine infection 2015   Vitamin D  deficiency    Wears glasses    Family History  Problem Relation Age of Onset   Cataracts Mother    Lupus Mother    Hypertension Mother    Hyperlipidemia Mother    Migraines  Mother    Kidney disease Mother    Hypertension Father    Diabetes Father    Hyperlipidemia Father    Depression Father    Anxiety disorder Father    Dementia Father    Obesity Sister    Hypertension Sister    Diabetes Sister    Ovarian cysts Daughter    Heart disease Paternal Uncle        CABG   Hyperlipidemia Paternal Uncle    Cancer Paternal Uncle        multiple with colon cancer   Aplastic anemia Maternal Grandfather    AAA (abdominal aortic aneurysm) Maternal Grandfather    Cancer Cousin        breast   Cancer Cousin        ovarian   Stroke Neg Hx    Past Surgical History:  Procedure Laterality Date   CERVICAL  CONIZATION W/BX N/A 03/05/2020   Procedure: CONIZATION CERVIX WITH BIOPSY;  Surgeon: Estelle Service, MD;  Location: [facility];  Service: Gynecology;  Laterality: N/A;   CHOLECYSTECTOMY  2011   [facility]   COLONOSCOPY  2014   repeat 5 years; Dr. Rosalie, baseline   CYSTOSCOPY N/A 03/20/2020   Procedure: CYSTOSCOPY;  Surgeon: Estelle Service, MD;  Location: [facility];  Service: Gynecology;  Laterality: N/A;   ESOPHAGOGASTRODUODENOSCOPY  2014   Dr. Rosalie   FOOT SURGERY Left 2011   morton's neuroma   LAPAROSCOPIC VAGINAL HYSTERECTOMY WITH SALPINGECTOMY Bilateral 03/20/2020   Procedure: LAPAROSCOPIC ASSISTED VAGINAL HYSTERECTOMY WITH SALPINGECTOMY;  Surgeon: Estelle Service, MD;  Location: [facility];  Service: Gynecology;  Laterality: Bilateral;   LASIK  yrs ago   Dr. Osvaldo   LUMBAR DISC SURGERY  07/2013, [facility]   due to herniated disc, Dr. Beuford PUFFER ABLATION  2015   WISDOM TOOTH EXTRACTION  age 34   Social History   Occupational History   Not on file  Tobacco Use   Smoking status: Never   Smokeless tobacco: Never  Vaping Use   Vaping status: Never Used  Substance and Sexual Activity   Alcohol use: Yes    Alcohol/week: 3.0 standard drinks of alcohol    Types: 3 Cans of  beer per week    Comment: socially   Drug use: No   Sexual activity: Not Currently    Partners: Male    Comment: partner had vasectomy

## 2023-06-07 ENCOUNTER — Other Ambulatory Visit: Payer: Self-pay

## 2023-06-09 DIAGNOSIS — J3089 Other allergic rhinitis: Secondary | ICD-10-CM | POA: Diagnosis not present

## 2023-06-09 DIAGNOSIS — J301 Allergic rhinitis due to pollen: Secondary | ICD-10-CM | POA: Diagnosis not present

## 2023-06-09 DIAGNOSIS — J3081 Allergic rhinitis due to animal (cat) (dog) hair and dander: Secondary | ICD-10-CM | POA: Diagnosis not present

## 2023-06-10 ENCOUNTER — Other Ambulatory Visit (INDEPENDENT_AMBULATORY_CARE_PROVIDER_SITE_OTHER): Payer: 59

## 2023-06-10 DIAGNOSIS — Z6834 Body mass index (BMI) 34.0-34.9, adult: Secondary | ICD-10-CM | POA: Diagnosis not present

## 2023-06-10 DIAGNOSIS — Z23 Encounter for immunization: Secondary | ICD-10-CM | POA: Diagnosis not present

## 2023-06-10 DIAGNOSIS — D508 Other iron deficiency anemias: Secondary | ICD-10-CM | POA: Diagnosis not present

## 2023-06-10 DIAGNOSIS — Z131 Encounter for screening for diabetes mellitus: Secondary | ICD-10-CM | POA: Diagnosis not present

## 2023-06-10 DIAGNOSIS — K219 Gastro-esophageal reflux disease without esophagitis: Secondary | ICD-10-CM | POA: Diagnosis not present

## 2023-06-10 DIAGNOSIS — E7849 Other hyperlipidemia: Secondary | ICD-10-CM | POA: Diagnosis not present

## 2023-06-10 DIAGNOSIS — N951 Menopausal and female climacteric states: Secondary | ICD-10-CM | POA: Diagnosis not present

## 2023-06-10 DIAGNOSIS — F3342 Major depressive disorder, recurrent, in full remission: Secondary | ICD-10-CM | POA: Diagnosis not present

## 2023-06-10 LAB — IRON,TIBC AND FERRITIN PANEL
Ferritin: 23
Iron: 26

## 2023-06-10 LAB — BASIC METABOLIC PANEL
CO2: 25 — AB (ref 13–22)
Chloride: 105 (ref 99–108)
Creatinine: 0.9 (ref 0.5–1.1)
Glucose: 88
Potassium: 4.1 meq/L (ref 3.5–5.1)
Sodium: 138 (ref 137–147)

## 2023-06-10 LAB — TESTOSTERONE: Testosterone: 41

## 2023-06-10 LAB — COMPREHENSIVE METABOLIC PANEL
Calcium: 9.5 (ref 8.7–10.7)
eGFR: 74

## 2023-06-10 LAB — TSH: TSH: 1.5 (ref 0.41–5.90)

## 2023-06-10 LAB — CBC AND DIFFERENTIAL
HCT: 37 (ref 36–46)
Hemoglobin: 11.3 — AB (ref 12.0–16.0)
WBC: 6

## 2023-06-10 LAB — HEPATIC FUNCTION PANEL
ALT: 27 U/L (ref 7–35)
AST: 34 (ref 13–35)
Alkaline Phosphatase: 116 (ref 25–125)
Bilirubin, Total: 0.4

## 2023-06-10 LAB — LIPID PANEL
Cholesterol: 201 — AB (ref 0–200)
HDL: 86 — AB (ref 35–70)
LDL Cholesterol: 98
Triglycerides: 85 (ref 40–160)

## 2023-06-10 LAB — VITAMIN D 25 HYDROXY (VIT D DEFICIENCY, FRACTURES): Vit D, 25-Hydroxy: 60.6

## 2023-06-10 LAB — CBC: RBC: 4.98 (ref 3.87–5.11)

## 2023-06-11 ENCOUNTER — Other Ambulatory Visit (HOSPITAL_COMMUNITY): Payer: Self-pay

## 2023-06-11 LAB — HEMOGLOBIN A1C: Hemoglobin A1C: 5.6

## 2023-06-15 ENCOUNTER — Encounter: Payer: Self-pay | Admitting: Sports Medicine

## 2023-06-15 ENCOUNTER — Other Ambulatory Visit (HOSPITAL_BASED_OUTPATIENT_CLINIC_OR_DEPARTMENT_OTHER): Payer: Self-pay

## 2023-06-15 ENCOUNTER — Other Ambulatory Visit: Payer: Self-pay

## 2023-06-15 ENCOUNTER — Ambulatory Visit: Payer: 59 | Admitting: Sports Medicine

## 2023-06-15 DIAGNOSIS — J301 Allergic rhinitis due to pollen: Secondary | ICD-10-CM | POA: Diagnosis not present

## 2023-06-15 DIAGNOSIS — K219 Gastro-esophageal reflux disease without esophagitis: Secondary | ICD-10-CM | POA: Diagnosis not present

## 2023-06-15 DIAGNOSIS — Z6835 Body mass index (BMI) 35.0-35.9, adult: Secondary | ICD-10-CM | POA: Diagnosis not present

## 2023-06-15 DIAGNOSIS — M722 Plantar fascial fibromatosis: Secondary | ICD-10-CM | POA: Diagnosis not present

## 2023-06-15 DIAGNOSIS — Q6671 Congenital pes cavus, right foot: Secondary | ICD-10-CM

## 2023-06-15 DIAGNOSIS — Z1339 Encounter for screening examination for other mental health and behavioral disorders: Secondary | ICD-10-CM | POA: Diagnosis not present

## 2023-06-15 DIAGNOSIS — N951 Menopausal and female climacteric states: Secondary | ICD-10-CM | POA: Diagnosis not present

## 2023-06-15 DIAGNOSIS — J3081 Allergic rhinitis due to animal (cat) (dog) hair and dander: Secondary | ICD-10-CM | POA: Diagnosis not present

## 2023-06-15 DIAGNOSIS — Z1331 Encounter for screening for depression: Secondary | ICD-10-CM | POA: Diagnosis not present

## 2023-06-15 DIAGNOSIS — J3089 Other allergic rhinitis: Secondary | ICD-10-CM | POA: Diagnosis not present

## 2023-06-15 DIAGNOSIS — D508 Other iron deficiency anemias: Secondary | ICD-10-CM | POA: Diagnosis not present

## 2023-06-15 DIAGNOSIS — E7849 Other hyperlipidemia: Secondary | ICD-10-CM | POA: Diagnosis not present

## 2023-06-15 DIAGNOSIS — R635 Abnormal weight gain: Secondary | ICD-10-CM | POA: Diagnosis not present

## 2023-06-15 MED ORDER — PROGESTERONE MICRONIZED 100 MG PO CAPS
100.0000 mg | ORAL_CAPSULE | Freq: Every day | ORAL | 2 refills | Status: DC
  Filled 2023-06-15 (×2): qty 30, 30d supply, fill #0
  Filled 2023-07-10: qty 30, 30d supply, fill #1
  Filled 2023-08-25: qty 30, 30d supply, fill #2

## 2023-06-15 NOTE — Progress Notes (Signed)
 Michelle Griffin - 53 y.o. female MRN [id number]  Date of birth: Nov 20, 1970  Office Visit Note: Visit Date: 06/15/2023 PCP: Bulah Alm RAMAN, PA-C Referred by: Bulah Alm RAMAN, PA-C  Subjective: Chief Complaint  Patient presents with   Right Foot - Pain, Follow-up   HPI: Michelle Griffin is a pleasant 53 y.o. female who presents today for right foot pain and plantar fasciitis.  Back 1.5 weeks ago we did proceed with a trial of extracorporeal shockwave therapy.  She did note a positive response to this and has found this beneficial as well as her orthotics.  She unfortunately has not been able to pick these in some of her lower profile shoes.  She has continue her home stretching and rehab daily.  Pertinent ROS were reviewed with the patient and found to be negative unless otherwise specified above in HPI.   Assessment & Plan: Visit Diagnoses:  1. Plantar fasciitis of right foot   2. Pes cavus of right foot    Plan: Impression is right foot plan fasciitis which did have a positive response to our first treatment of extracorporeal shockwave therapy.  We did repeat a second treatment today. She will continue her stretching and home rehab exercises.  She will work on wearing tennis shoes that are able to fit her orthotic insoles as I do think this is helping support her pes cavus.  Will have her follow-up over the next 1-1.5 weeks for repeat evaluation and consideration of a third shockwave treatment.  If she is continuing to find benefit we will proceed with additional treatments.  If her improvement plateaus could consider a plantar fascia injection or a low-dose of oral prednisone  along with her ESWT.  Follow-up: Return for f/u in 7-10 days for R-PF (foot).   Meds & Orders: No orders of the defined types were placed in this encounter.  No orders of the defined types were placed in this encounter.    Procedures: Procedure: ECSWT Indications: Plantar fasciitis   Procedure  Details Consent: Risks of procedure as well as the alternatives and risks of each were explained to the patient.  Verbal consent for procedure obtained. Time Out: Verified patient identification, verified procedure, site was marked, verified correct patient position. The area was cleaned with alcohol swab.     The right plantar fascia was targeted for Extracorporeal shockwave therapy.    Preset: Plantar fasciitis Power Level: 90 mJ  Frequency: 10 Hz Impulse/cycles: 2800 Head size: Regular   Patient tolerated procedure well without immediate complications      Clinical History: No specialty comments available.  She reports that she has never smoked. She has never used smokeless tobacco.  Recent Labs    01/22/23 0934  HGBA1C 5.8*    Objective:   Vital Signs: LMP 03/16/2015   Physical Exam  Gen: Well-appearing, in no acute distress; non-toxic CV: Well-perfused. Warm.  Resp: Breathing unlabored on room air; no wheezing. Psych: Fluid speech in conversation; appropriate affect; normal thought process  Ortho Exam - Right foot: + TTP over the medial aspect of the calcaneus near the insertion of the plantar fascia.  Negative heel squeeze.  No redness swelling or effusion of the ankle or foot.  Imaging: No results found.  Past Medical/Family/Surgical/Social History: Medications & Allergies reviewed per EMR, new medications updated. Patient Active Problem List   Diagnosis Date Noted   High risk medication use 04/30/2021   Anxiety 04/30/2021   Encounter for health maintenance examination in adult 11/05/2020  Vaccine counseling 11/05/2020   Need for pneumococcal vaccination 11/05/2020   Screening for diabetes mellitus 11/05/2020   Gastroesophageal reflux disease 05/29/2020   S/P hysterectomy 05/29/2020   S/P laparoscopic assisted vaginal hysterectomy (LAVH) 03/20/2020   Insomnia 09/08/2019   Need for Td vaccine 02/17/2016   Obesity with serious comorbidity 02/17/2016    Fatty liver 02/13/2015   Vitamin D  deficiency 02/13/2015   PMDD (premenstrual dysphoric disorder) 02/13/2015   Hyperlipidemia 02/13/2015   OAB (overactive bladder) 02/13/2015   Rhinitis, allergic 02/13/2015   Asthma, mild persistent 02/13/2015   Screening for lipid disorders 02/13/2015   Impaired fasting blood sugar 02/13/2015   DEGENERATIVE DISC DISEASE, LUMBAR SPINE 11/13/2008   Past Medical History:  Diagnosis Date   Adenocarcinoma in situ of cervix    Allergy    sees Dr. Frutoso   Anemia    hx/o iron  deficiency due to heavy periods   Asthma    exercise induced   Bursitis    Cancer (HCC)    cervical   COVID-19 05/26/2019   fatigue, h/a, loss of taste and smell, fatige x 1 month, loss of taste x 5 months all symptoms rsolved   DDD (degenerative disc disease), lumbar    Fatty liver    GERD (gastroesophageal reflux disease)    Dr. Rosalie   H/O mammogram    Hiatal hernia    small per EGD, Dr. Rosalie   Hyperlipidemia 2012   was on pravastatin  prior   Migraine    prior eval with neurologist, Dr. Lindy   Overactive bladder    PMDD (premenstrual dysphoric disorder)    Pyelonephritis    age 31yo   Uterine infection 2015   Vitamin D  deficiency    Wears glasses    Family History  Problem Relation Age of Onset   Cataracts Mother    Lupus Mother    Hypertension Mother    Hyperlipidemia Mother    Migraines Mother    Kidney disease Mother    Hypertension Father    Diabetes Father    Hyperlipidemia Father    Depression Father    Anxiety disorder Father    Dementia Father    Obesity Sister    Hypertension Sister    Diabetes Sister    Ovarian cysts Daughter    Heart disease Paternal Uncle        CABG   Hyperlipidemia Paternal Uncle    Cancer Paternal Uncle        multiple with colon cancer   Aplastic anemia Maternal Grandfather    AAA (abdominal aortic aneurysm) Maternal Grandfather    Cancer Cousin        breast   Cancer Cousin        ovarian   Stroke Neg Hx     Past Surgical History:  Procedure Laterality Date   CERVICAL CONIZATION W/BX N/A 03/05/2020   Procedure: CONIZATION CERVIX WITH BIOPSY;  Surgeon: Estelle Service, MD;  Location: [facility];  Service: Gynecology;  Laterality: N/A;   CHOLECYSTECTOMY  2011   [facility]   COLONOSCOPY  2014   repeat 5 years; Dr. Rosalie, baseline   CYSTOSCOPY N/A 03/20/2020   Procedure: CYSTOSCOPY;  Surgeon: Estelle Service, MD;  Location: [facility];  Service: Gynecology;  Laterality: N/A;   ESOPHAGOGASTRODUODENOSCOPY  2014   Dr. Rosalie   FOOT SURGERY Left 2011   morton's neuroma   LAPAROSCOPIC VAGINAL HYSTERECTOMY WITH SALPINGECTOMY Bilateral 03/20/2020   Procedure: LAPAROSCOPIC ASSISTED VAGINAL HYSTERECTOMY WITH SALPINGECTOMY;  Surgeon: Estelle Service, MD;  Location: [facility];  Service: Gynecology;  Laterality: Bilateral;   LASIK  yrs ago   Dr. Osvaldo   LUMBAR DISC SURGERY  07/2013, [facility]   due to herniated disc, Dr. Beuford PUFFER ABLATION  2015   WISDOM TOOTH EXTRACTION  age 13   Social History   Occupational History   Not on file  Tobacco Use   Smoking status: Never   Smokeless tobacco: Never  Vaping Use   Vaping status: Never Used  Substance and Sexual Activity   Alcohol use: Yes    Alcohol/week: 3.0 standard drinks of alcohol    Types: 3 Cans of beer per week    Comment: socially   Drug use: No   Sexual activity: Not Currently    Partners: Male    Comment: partner had vasectomy

## 2023-06-15 NOTE — Progress Notes (Signed)
 Patient says that she does still have pain but she is feeling noticeably better - about 15%. She still does her home exercises and says that those feel okay. She wears her insoles in some shoes but says that they are too long for others.

## 2023-06-16 ENCOUNTER — Telehealth: Payer: Commercial Managed Care - PPO | Admitting: Medical

## 2023-06-16 DIAGNOSIS — E611 Iron deficiency: Secondary | ICD-10-CM

## 2023-06-16 DIAGNOSIS — R718 Other abnormality of red blood cells: Secondary | ICD-10-CM | POA: Diagnosis not present

## 2023-06-16 DIAGNOSIS — Z1211 Encounter for screening for malignant neoplasm of colon: Secondary | ICD-10-CM

## 2023-06-16 NOTE — Addendum Note (Signed)
 Addended by: Charliene Conte A on: 06/16/2023 10:55 AM   Modules accepted: Orders

## 2023-06-16 NOTE — Progress Notes (Signed)
 Subjective:     Patient ID: Michelle Griffin, female   DOB: 05/16/1971, 53 y.o.   MRN: [id number]  This visit type was conducted due to national recommendations for restrictions regarding the COVID-19 Pandemic (e.g. social distancing) in an effort to limit this patient's exposure and mitigate transmission in our community.  Due to their co-morbid illnesses, this patient is at least at moderate risk for complications without adequate follow up.  This format is felt to be most appropriate for this patient at this time.    Documentation for virtual audio and video telecommunications through [place] encounter:  The patient was located at home. The provider was located in the office. The patient did consent to this visit and is aware of possible charges through their insurance for this visit.  The other persons participating in this telemedicine service were none. Time spent on call was 20 minutes and in review of previous records 20 minutes total.  This virtual service is not related to other E/M service within previous 7 days.   HPI Chief Complaint  Patient presents with   Consult    Discuss lab results. Discuss Possible Iron  infusion. Patient goes to [facility] in [place] for her weight loss . I have abstracted labs into chart from her visit with them on 06/10/23   Virtual consult.  Has been seeing [facility] weight loss.  Not seeing improve with phentermine  and Topamax .  Did see improvement on GLP1 medicaiton but insurance wouldn't cover this last year.     Had recent labs with [facility], has concerns about anemia, iron  deficiency, and the other clinic recommended iron  infusion.     She has ordered a brand of iron  called Klair iron  chelate.  She has not started yet but is coming in the mail  She denies any blood in the stool or any other bleeding or bruising.  She is not getting periods   Past Medical History:  Diagnosis Date   Adenocarcinoma in situ of cervix    Allergy    sees Dr.  Almeda Jacobs   Anemia    hx/o iron  deficiency due to heavy periods   Asthma    exercise induced   Bursitis    Cancer (HCC)    cervical   COVID-19 05/26/2019   fatigue, h/a, loss of taste and smell, fatige x 1 month, loss of taste x 5 months all symptoms rsolved   DDD (degenerative disc disease), lumbar    Fatty liver    GERD (gastroesophageal reflux disease)    Dr. Lavaughn [place]   H/O mammogram    Hiatal hernia    small per EGD, Dr. Lavaughn [place]   Hyperlipidemia 2012   was on pravastatin  prior   Migraine    prior eval with neurologist, Dr. Lamar Pillar   Overactive bladder    PMDD (premenstrual dysphoric disorder)    Pyelonephritis    age 65yo   Uterine infection 2015   Vitamin D  deficiency    Wears glasses    Current Outpatient Medications on File Prior to Visit  Medication Sig Dispense Refill   albuterol  (VENTOLIN  HFA) 108 (90 Base) MCG/ACT inhaler Inhale 1-2 puffs into the lungs every 4-6 hours as needed for cough or wheeze 6.7 g 2   azelastine  (OPTIVAR ) 0.05 % ophthalmic solution Instill 1 drop into the affected eye(s) twice daily as directed 6 mL 3   Azelastine  HCl 137 MCG/SPRAY SOLN Place 1-2 sprays into both nostrils 2 (two) times daily. 90 mL 3   brexpiprazole  (REXULTI )  2 MG TABS tablet Take 1 tablet (2 mg total) by mouth daily. 30 tablet 5   Ergocalciferol  (VITAMIN D2) 50 MCG (2000 UT) TABS Take 1 tablet by mouth daily. 90 tablet 3   estradiol  (VIVELLE -DOT) 0.1 MG/24HR patch Place 1 patch (0.1 mg total) onto the skin 2 (two) times a week. 24 patch 4   fluticasone  (FLONASE ) 50 MCG/ACT nasal spray Place 1-2 sprays into both nostrils 1 (one) to 2 (two) times daily, as directed. 48 g 2   levocetirizine (XYZAL ) 5 MG tablet Take 1 tablet (5 mg total) by mouth every evening. 90 tablet 3   montelukast  (SINGULAIR ) 10 MG tablet Take 1 tablet by mouth every evening 90 tablet 20   pantoprazole  (PROTONIX ) 40 MG tablet Take 1 tablet (40 mg total) by mouth daily. 90 tablet 1   phentermine  (ADIPEX-P ) 37.5 MG  tablet Take 1/2-1 tablet by mouth daily 30 tablet 1   Probiotic Product (RESTORA) CAPS Take 1 capsule by mouth daily. 90 capsule 3   progesterone  (PROMETRIUM ) 100 MG capsule Take 1 capsule (100 mg total) by mouth at bedtime. 30 capsule 2   rosuvastatin  (CRESTOR ) 10 MG tablet Take 1 tablet (10 mg total) by mouth daily. 90 tablet 1   sertraline  (ZOLOFT ) 100 MG tablet Take 1 tablet (100 mg total) by mouth daily. 90 tablet 0   traZODone  (DESYREL ) 50 MG tablet Take 0.5-1 tablets (25-50 mg total) by mouth at bedtime as needed for sleep. 90 tablet 0   Blood Pressure Monitoring (OMRON 3 SERIES BP MONITOR) DEVI Use as directed 1 each 0   EPINEPHrine  0.3 mg/0.3 mL IJ SOAJ injection Inject as directed as needed for systemic reaction 2 each 1   ondansetron  (ZOFRAN ) 4 MG tablet Take 1 tablet (4 mg total) by mouth every 8 (eight) hours as needed for nausea or vomiting. (Patient not taking: Reported on 06/16/2023) 90 tablet 0   No current facility-administered medications on file prior to visit.   Past Surgical History:  Procedure Laterality Date   CERVICAL CONIZATION W/BX N/A 03/05/2020   Procedure: CONIZATION CERVIX WITH BIOPSY;  Surgeon: Rogene Claude, MD;  Location: [facility];  Service: Gynecology;  Laterality: N/A;   CHOLECYSTECTOMY  2011   [facility]   COLONOSCOPY  2014   repeat 5 years; Dr. Lavaughn [place], baseline   CYSTOSCOPY N/A 03/20/2020   Procedure: CYSTOSCOPY;  Surgeon: Rogene Claude, MD;  Location: [facility];  Service: Gynecology;  Laterality: N/A;   ESOPHAGOGASTRODUODENOSCOPY  2014   Dr. Lavaughn [place]   FOOT SURGERY Left 2011   morton's neuroma   LAPAROSCOPIC VAGINAL HYSTERECTOMY WITH SALPINGECTOMY Bilateral 03/20/2020   Procedure: LAPAROSCOPIC ASSISTED VAGINAL HYSTERECTOMY WITH SALPINGECTOMY;  Surgeon: Rogene Claude, MD;  Location: [facility];  Service: Gynecology;  Laterality: Bilateral;   LASIK  yrs ago   Dr. Eulogio Hidalgo   LUMBAR DISC SURGERY   07/2013, [facility]   due to herniated disc, Dr. Margrette Shield ABLATION  2015   WISDOM TOOTH EXTRACTION  age 48     Review of Systems As in subjective    Objective:   Physical Exam Due to coronavirus pandemic stay at home measures, patient visit was virtual and they were not examined in person.   LMP 03/16/2015   Gen: wd, wn, nad      Assessment:     Encounter Diagnoses  Name Primary?   Iron  deficiency Yes   Low mean corpuscular volume (MCV)  Plan:     We reviewed her recent labs as well as a couple decades of lab results.  She fluctuates between normal iron  and low iron  and fluctuates between mild anemia and normal  Recent hemoglobin was within normal range but the iron  was 26.  She probably does not absorb iron  very well.  I recommend she continue to take an oral iron  preparation lifelong.  There is no real indication for an iron  infusion at this time.  We discussed doing types of iron  such as ferrous gluconate, polysaccharide iron .  She will start her iron  supplement soon.  She will plan to come back in for repeat lab in 3 months  We will do a fit test stool blood test for additional evaluation in case there is some occult blood  Nashae was seen today for consult.  Diagnoses and all orders for this visit:  Iron  deficiency -     Iron , TIBC and Ferritin Panel; Future -     CBC with Differential/Platelet; Future  Low mean corpuscular volume (MCV) -     Iron , TIBC and Ferritin Panel; Future -     CBC with Differential/Platelet; Future    F/u 26mo

## 2023-06-16 NOTE — Progress Notes (Signed)
 I have ordered and will mail this to her

## 2023-06-23 ENCOUNTER — Other Ambulatory Visit (HOSPITAL_COMMUNITY): Payer: Self-pay

## 2023-06-23 DIAGNOSIS — Z6835 Body mass index (BMI) 35.0-35.9, adult: Secondary | ICD-10-CM | POA: Diagnosis not present

## 2023-06-23 DIAGNOSIS — D508 Other iron deficiency anemias: Secondary | ICD-10-CM | POA: Diagnosis not present

## 2023-06-25 ENCOUNTER — Other Ambulatory Visit (HOSPITAL_COMMUNITY): Payer: Self-pay

## 2023-06-25 ENCOUNTER — Ambulatory Visit (INDEPENDENT_AMBULATORY_CARE_PROVIDER_SITE_OTHER): Payer: Commercial Managed Care - PPO | Admitting: Sports Medicine

## 2023-06-25 ENCOUNTER — Encounter: Payer: Self-pay | Admitting: Sports Medicine

## 2023-06-25 DIAGNOSIS — J3081 Allergic rhinitis due to animal (cat) (dog) hair and dander: Secondary | ICD-10-CM | POA: Diagnosis not present

## 2023-06-25 DIAGNOSIS — M722 Plantar fascial fibromatosis: Secondary | ICD-10-CM

## 2023-06-25 DIAGNOSIS — Q6671 Congenital pes cavus, right foot: Secondary | ICD-10-CM

## 2023-06-25 DIAGNOSIS — J301 Allergic rhinitis due to pollen: Secondary | ICD-10-CM | POA: Diagnosis not present

## 2023-06-25 DIAGNOSIS — J3089 Other allergic rhinitis: Secondary | ICD-10-CM | POA: Diagnosis not present

## 2023-06-25 NOTE — Progress Notes (Signed)
[name] - 53 y.o. female MRN [id number]  Date of birth: 1970-11-07  Office Visit Note: Visit Date: 06/25/2023 PCP: Michelle Canavan, PA-C Referred by: Michelle Canavan, PA-C  Subjective: Chief Complaint  Patient presents with   Right Foot - Follow-up   HPI: Michelle Griffin is a pleasant 53 y.o. female who presents today for f/u right foot pain and plantar fasciitis.   We have performed 2 treatments of extracorporeal shockwave therapy and she has noticed good improvement with these treatments as well as her home exercises we provided for her.  She feels like she is about 50% improved at this point.  Requiring any medications.  Is using her orthotics and finding them helpful.  Pertinent ROS were reviewed with the patient and found to be negative unless otherwise specified above in HPI.   Assessment & Plan: Visit Diagnoses:  1. Plantar fasciitis of right foot   2. Pes cavus of right foot    Plan: Impression is improving plantar fasciitis of the right foot .  She is receiving benefit from extracorporeal shockwave therapy for stretching/home rehab exercises and orthotic fitting.  We did repeat ESWT today which she tolerated well.  She will continue her HEP and we will proceed with additional treatments and reevaluation about 1 week from now.  Given her improvement, we will hold from any discussion on injection or oral medication at this time.  Follow-up in 1 week.  Follow-up: Return in about 1 week (around 07/02/2023) for R-Plantar fascia.   Meds & Orders: No orders of the defined types were placed in this encounter.  No orders of the defined types were placed in this encounter.    Procedures: Procedure: ECSWT Indications: Plantar fasciitis   Procedure Details Consent: Risks of procedure as well as the alternatives and risks of each were explained to the patient.  Verbal consent for procedure obtained. Time Out: Verified patient identification, verified procedure, site was  marked, verified correct patient position. The area was cleaned with alcohol swab.     The right plantar fascia was targeted for Extracorporeal shockwave therapy.    Preset: Plantar fasciitis Power Level: 100 mJ   Frequency: 11 Hz Impulse/cycles: 2800 Head size: Regular   Patient tolerated procedure well without immediate complications       Clinical History: No specialty comments available.  She reports that she has never smoked. She has never used smokeless tobacco.  Recent Labs    01/22/23 0934 06/10/23 0000  HGBA1C 5.8* 5.6    Objective:   Vital Signs: LMP 03/16/2015   Physical Exam  Gen: Well-appearing, in no acute distress; non-toxic CV: Well-perfused. Warm.  Resp: Breathing unlabored on room air; no wheezing. Psych: Fluid speech in conversation; appropriate affect; normal thought process  Ortho Exam - Right foot: There is mild TTP of the medial aspect of the plantar fascia just proximal to the calcaneus but is less tender than previous visits.  There is high arch of the foot noted.  No redness swelling of the ankle or foot.  Imaging: No results found.  Past Medical/Family/Surgical/Social History: Medications & Allergies reviewed per EMR, new medications updated. Patient Active Problem List   Diagnosis Date Noted   High risk medication use 04/30/2021   Anxiety 04/30/2021   Encounter for health maintenance examination in adult 11/05/2020   Vaccine counseling 11/05/2020   Need for pneumococcal vaccination 11/05/2020   Screening for diabetes mellitus 11/05/2020   Gastroesophageal reflux disease 05/29/2020   S/P hysterectomy  05/29/2020   S/P laparoscopic assisted vaginal hysterectomy (LAVH) 03/20/2020   Insomnia 09/08/2019   Need for Td vaccine 02/17/2016   Obesity with serious comorbidity 02/17/2016   Fatty liver 02/13/2015   Vitamin D deficiency 02/13/2015   PMDD (premenstrual dysphoric disorder) 02/13/2015   Hyperlipidemia 02/13/2015   OAB (overactive  bladder) 02/13/2015   Rhinitis, allergic 02/13/2015   Asthma, mild persistent 02/13/2015   Screening for lipid disorders 02/13/2015   Impaired fasting blood sugar 02/13/2015   DEGENERATIVE DISC DISEASE, LUMBAR SPINE 11/13/2008   Past Medical History:  Diagnosis Date   Adenocarcinoma in situ of cervix    Allergy    sees Dr. Raymond Callas   Anemia    hx/o iron deficiency due to heavy periods   Asthma    exercise induced   Bursitis    Cancer (HCC)    cervical   COVID-19 05/26/2019   fatigue, h/a, loss of taste and smell, fatige x 1 month, loss of taste x 5 months all symptoms rsolved   DDD (degenerative disc disease), lumbar    Fatty liver    GERD (gastroesophageal reflux disease)    Dr. Ewing Schlein   H/O mammogram    Hiatal hernia    small per EGD, Dr. Ewing Schlein   Hyperlipidemia 2012   was on pravastatin prior   Migraine    prior eval with neurologist, Dr. Vela Prose   Overactive bladder    PMDD (premenstrual dysphoric disorder)    Pyelonephritis    age 80yo   Uterine infection 2015   Vitamin D deficiency    Wears glasses    Family History  Problem Relation Age of Onset   Cataracts Mother    Lupus Mother    Hypertension Mother    Hyperlipidemia Mother    Migraines Mother    Kidney disease Mother    Hypertension Father    Diabetes Father    Hyperlipidemia Father    Depression Father    Anxiety disorder Father    Dementia Father    Obesity Sister    Hypertension Sister    Diabetes Sister    Ovarian cysts Daughter    Heart disease Paternal Uncle        CABG   Hyperlipidemia Paternal Uncle    Cancer Paternal Uncle        multiple with colon cancer   Aplastic anemia Maternal Grandfather    AAA (abdominal aortic aneurysm) Maternal Grandfather    Cancer Cousin        breast   Cancer Cousin        ovarian   Stroke Neg Hx    Past Surgical History:  Procedure Laterality Date   CERVICAL CONIZATION W/BX N/A 03/05/2020   Procedure: CONIZATION CERVIX WITH BIOPSY;  Surgeon:  Huel Cote, MD;  Location: [facility];  Service: Gynecology;  Laterality: N/A;   CHOLECYSTECTOMY  2011   [facility]   COLONOSCOPY  2014   repeat 5 years; Dr. Ewing Schlein, baseline   CYSTOSCOPY N/A 03/20/2020   Procedure: CYSTOSCOPY;  Surgeon: Huel Cote, MD;  Location: [facility];  Service: Gynecology;  Laterality: N/A;   ESOPHAGOGASTRODUODENOSCOPY  2014   Dr. Ewing Schlein   FOOT SURGERY Left 2011   morton's neuroma   LAPAROSCOPIC VAGINAL HYSTERECTOMY WITH SALPINGECTOMY Bilateral 03/20/2020   Procedure: LAPAROSCOPIC ASSISTED VAGINAL HYSTERECTOMY WITH SALPINGECTOMY;  Surgeon: Huel Cote, MD;  Location: [facility];  Service: Gynecology;  Laterality: Bilateral;   LASIK  yrs ago   Dr. Renato Battles  LUMBAR DISC SURGERY  07/2013, [facility]   due to herniated disc, Dr. Ova Freshwater ABLATION  2015   WISDOM TOOTH EXTRACTION  age 89   Social History   Occupational History   Not on file  Tobacco Use   Smoking status: Never   Smokeless tobacco: Never  Vaping Use   Vaping status: Never Used  Substance and Sexual Activity   Alcohol use: Yes    Alcohol/week: 3.0 standard drinks of alcohol    Types: 3 Cans of beer per week    Comment: socially   Drug use: No   Sexual activity: Not Currently    Partners: Male    Comment: partner had vasectomy

## 2023-06-25 NOTE — Progress Notes (Signed)
Patient says that she is doing overall better. She says that she notices that she has pain in the heel less often now. She has not had to take any pain medication.

## 2023-06-28 ENCOUNTER — Other Ambulatory Visit (HOSPITAL_BASED_OUTPATIENT_CLINIC_OR_DEPARTMENT_OTHER): Payer: Self-pay

## 2023-06-28 ENCOUNTER — Other Ambulatory Visit (HOSPITAL_COMMUNITY): Payer: Self-pay

## 2023-06-29 ENCOUNTER — Other Ambulatory Visit: Payer: Self-pay

## 2023-06-29 ENCOUNTER — Other Ambulatory Visit (HOSPITAL_COMMUNITY): Payer: Self-pay

## 2023-06-30 ENCOUNTER — Other Ambulatory Visit: Payer: Self-pay

## 2023-06-30 ENCOUNTER — Other Ambulatory Visit: Payer: Self-pay | Admitting: Medical

## 2023-06-30 DIAGNOSIS — Z1231 Encounter for screening mammogram for malignant neoplasm of breast: Secondary | ICD-10-CM

## 2023-07-02 ENCOUNTER — Ambulatory Visit: Payer: Commercial Managed Care - PPO | Admitting: Sports Medicine

## 2023-07-02 ENCOUNTER — Encounter: Payer: Self-pay | Admitting: Sports Medicine

## 2023-07-02 ENCOUNTER — Other Ambulatory Visit: Payer: Self-pay | Admitting: Medical

## 2023-07-02 DIAGNOSIS — E7849 Other hyperlipidemia: Secondary | ICD-10-CM | POA: Diagnosis not present

## 2023-07-02 DIAGNOSIS — Z6835 Body mass index (BMI) 35.0-35.9, adult: Secondary | ICD-10-CM | POA: Diagnosis not present

## 2023-07-02 DIAGNOSIS — J301 Allergic rhinitis due to pollen: Secondary | ICD-10-CM | POA: Diagnosis not present

## 2023-07-02 DIAGNOSIS — M722 Plantar fascial fibromatosis: Secondary | ICD-10-CM

## 2023-07-02 DIAGNOSIS — J3081 Allergic rhinitis due to animal (cat) (dog) hair and dander: Secondary | ICD-10-CM | POA: Diagnosis not present

## 2023-07-02 DIAGNOSIS — J3089 Other allergic rhinitis: Secondary | ICD-10-CM | POA: Diagnosis not present

## 2023-07-02 DIAGNOSIS — M79671 Pain in right foot: Secondary | ICD-10-CM | POA: Diagnosis not present

## 2023-07-02 MED ORDER — PREDNISONE 20 MG PO TABS
20.0000 mg | ORAL_TABLET | Freq: Every day | ORAL | 0 refills | Status: AC
Start: 2023-07-02 — End: 2023-07-09

## 2023-07-02 MED ORDER — OSELTAMIVIR PHOSPHATE 75 MG PO CAPS: 75.0000 mg | ORAL_CAPSULE | Freq: Every day | ORAL | 0 refills | Status: DC

## 2023-07-02 NOTE — Progress Notes (Signed)
Patient says that she was feeling much better until she was on her feet for about 4 hours for work last night. She says that she has the same dull pain as well as some stiffness this morning.

## 2023-07-02 NOTE — Progress Notes (Signed)
Michelle Griffin - 53 y.o. female MRN [id number]  Date of birth: 06/20/1970  Office Visit Note: Visit Date: 07/02/2023 PCP: Jac Canavan, PA-C Referred by: Jac Canavan, PA-C  Subjective: Chief Complaint  Patient presents with   Right Foot - Follow-up   HPI: Michelle Griffin is a pleasant 53 y.o. female who presents today for f/u right foot pain and plantar fasciitis.   We have performed 3 treatments of extracorporeal shockwave therapy thus far and she had been making good progress, feels like she was 80% better or more.  However, unfortunately she had a flareup of her pain as she was on her feet for long periods of time working at [employer].  She notes some increased pain and soft swelling in the area.  Pertinent ROS were reviewed with the patient and found to be negative unless otherwise specified above in HPI.   Assessment & Plan: Visit Diagnoses:  1. Plantar fasciitis of right foot   2. Pain of right heel    Plan: Impression is chronic right heel pain with plantar fasciitis which has been making improvement to extracorporeal shockwave therapy.  She currently is experiencing a mild exacerbation given her prolonged standing with work.  We discussed treatment options but decided to move forward with an additional treatment of extracorporeal shockwave therapy given her improvement.  For her acute inflammatory state, we will place her on a short course of prednisone 20 mg x 7 days.  She will continue her frozen water bottle, relative rest and continue her plantar fascia stretching/exercises.  I would like to see her back in about 1 month and if she continues doing well we may release her with continued HEP or consider a few additional shockwave treatments.  If by that time she is not significantly improved, we may consider ultrasound-guided plantar fascia injection.  Follow-up: Return in about 1 month (around 07/30/2023) for R-plantar fascia (30-min for poss inj).   Meds & Orders:   Meds ordered this encounter  Medications   predniSONE (DELTASONE) 20 MG tablet    Sig: Take 1 tablet (20 mg total) by mouth daily with breakfast for 7 days.    Dispense:  7 tablet    Refill:  0   No orders of the defined types were placed in this encounter.    Procedures: Procedure: ECSWT Indications: Plantar fasciitis   Procedure Details Consent: Risks of procedure as well as the alternatives and risks of each were explained to the patient.  Verbal consent for procedure obtained. Time Out: Verified patient identification, verified procedure, site was marked, verified correct patient position. The area was cleaned with alcohol swab.     The right plantar fascia was targeted for Extracorporeal shockwave therapy.    Preset: Plantar fasciitis Power Level: 100-110 mJ Frequency: 11 Hz Impulse/cycles: 2800 Head size: Regular   Patient tolerated procedure well without immediate complications      Clinical History: No specialty comments available.  She reports that she has never smoked. She has never used smokeless tobacco.  Recent Labs    01/22/23 0934 06/10/23 0000  HGBA1C 5.8* 5.6    Objective:   Vital Signs: LMP 03/16/2015   Physical Exam  Gen: Well-appearing, in no acute distress; non-toxic CV: Well-perfused. Warm.  Resp: Breathing unlabored on room air; no wheezing. Psych: Fluid speech in conversation; appropriate affect; normal thought process  Ortho Exam - Right foot: + TTP of the medial band of the insertional plantar fascia.  There is  a small degree of soft tissue swelling here as well.  Full range of motion about the ankle joint itself.  Imaging: No results found.  Past Medical/Family/Surgical/Social History: Medications & Allergies reviewed per EMR, new medications updated. Patient Active Problem List   Diagnosis Date Noted   High risk medication use 04/30/2021   Anxiety 04/30/2021   Encounter for health maintenance examination in adult 11/05/2020    Vaccine counseling 11/05/2020   Need for pneumococcal vaccination 11/05/2020   Screening for diabetes mellitus 11/05/2020   Gastroesophageal reflux disease 05/29/2020   S/P hysterectomy 05/29/2020   S/P laparoscopic assisted vaginal hysterectomy (LAVH) 03/20/2020   Insomnia 09/08/2019   Need for Td vaccine 02/17/2016   Obesity with serious comorbidity 02/17/2016   Fatty liver 02/13/2015   Vitamin D deficiency 02/13/2015   PMDD (premenstrual dysphoric disorder) 02/13/2015   Hyperlipidemia 02/13/2015   OAB (overactive bladder) 02/13/2015   Rhinitis, allergic 02/13/2015   Asthma, mild persistent 02/13/2015   Screening for lipid disorders 02/13/2015   Impaired fasting blood sugar 02/13/2015   DEGENERATIVE DISC DISEASE, LUMBAR SPINE 11/13/2008   Past Medical History:  Diagnosis Date   Adenocarcinoma in situ of cervix    Allergy    sees Dr. [place] Callas   Anemia    hx/o iron deficiency due to heavy periods   Asthma    exercise induced   Bursitis    Cancer (HCC)    cervical   COVID-19 05/26/2019   fatigue, h/a, loss of taste and smell, fatige x 1 month, loss of taste x 5 months all symptoms rsolved   DDD (degenerative disc disease), lumbar    Fatty liver    GERD (gastroesophageal reflux disease)    Dr. Ewing Schlein   H/O mammogram    Hiatal hernia    small per EGD, Dr. Ewing Schlein   Hyperlipidemia 2012   was on pravastatin prior   Migraine    prior eval with neurologist, Dr. Vela Prose   Overactive bladder    PMDD (premenstrual dysphoric disorder)    Pyelonephritis    age 55yo   Uterine infection 2015   Vitamin D deficiency    Wears glasses    Family History  Problem Relation Age of Onset   Cataracts Mother    Lupus Mother    Hypertension Mother    Hyperlipidemia Mother    Migraines Mother    Kidney disease Mother    Hypertension Father    Diabetes Father    Hyperlipidemia Father    Depression Father    Anxiety disorder Father    Dementia Father    Obesity Sister    Hypertension  Sister    Diabetes Sister    Ovarian cysts Daughter    Heart disease Paternal Uncle        CABG   Hyperlipidemia Paternal Uncle    Cancer Paternal Uncle        multiple with colon cancer   Aplastic anemia Maternal Grandfather    AAA (abdominal aortic aneurysm) Maternal Grandfather    Cancer Cousin        breast   Cancer Cousin        ovarian   Stroke Neg Hx    Past Surgical History:  Procedure Laterality Date   CERVICAL CONIZATION W/BX N/A 03/05/2020   Procedure: CONIZATION CERVIX WITH BIOPSY;  Surgeon: Huel Cote, MD;  Location: [facility];  Service: Gynecology;  Laterality: N/A;   CHOLECYSTECTOMY  2011   [facility]   COLONOSCOPY  2014  repeat 5 years; Dr. Ewing Schlein, baseline   CYSTOSCOPY N/A 03/20/2020   Procedure: CYSTOSCOPY;  Surgeon: Huel Cote, MD;  Location: [facility];  Service: Gynecology;  Laterality: N/A;   ESOPHAGOGASTRODUODENOSCOPY  2014   Dr. Ewing Schlein   FOOT SURGERY Left 2011   morton's neuroma   LAPAROSCOPIC VAGINAL HYSTERECTOMY WITH SALPINGECTOMY Bilateral 03/20/2020   Procedure: LAPAROSCOPIC ASSISTED VAGINAL HYSTERECTOMY WITH SALPINGECTOMY;  Surgeon: Huel Cote, MD;  Location: [facility];  Service: Gynecology;  Laterality: Bilateral;   LASIK  yrs ago   Dr. Renato Battles   LUMBAR DISC SURGERY  07/2013, [facility]   due to herniated disc, Dr. Ova Freshwater ABLATION  2015   WISDOM TOOTH EXTRACTION  age 45   Social History   Occupational History   Not on file  Tobacco Use   Smoking status: Never   Smokeless tobacco: Never  Vaping Use   Vaping status: Never Used  Substance and Sexual Activity   Alcohol use: Yes    Alcohol/week: 3.0 standard drinks of alcohol    Types: 3 Cans of beer per week    Comment: socially   Drug use: No   Sexual activity: Not Currently    Partners: Male    Comment: partner had vasectomy

## 2023-07-06 ENCOUNTER — Ambulatory Visit: Payer: Commercial Managed Care - PPO | Admitting: Sports Medicine

## 2023-07-08 DIAGNOSIS — J3081 Allergic rhinitis due to animal (cat) (dog) hair and dander: Secondary | ICD-10-CM | POA: Diagnosis not present

## 2023-07-08 DIAGNOSIS — J3089 Other allergic rhinitis: Secondary | ICD-10-CM | POA: Diagnosis not present

## 2023-07-08 DIAGNOSIS — Z6835 Body mass index (BMI) 35.0-35.9, adult: Secondary | ICD-10-CM | POA: Diagnosis not present

## 2023-07-08 DIAGNOSIS — J301 Allergic rhinitis due to pollen: Secondary | ICD-10-CM | POA: Diagnosis not present

## 2023-07-08 DIAGNOSIS — F3342 Major depressive disorder, recurrent, in full remission: Secondary | ICD-10-CM | POA: Diagnosis not present

## 2023-07-10 ENCOUNTER — Other Ambulatory Visit (HOSPITAL_COMMUNITY): Payer: Self-pay

## 2023-07-14 ENCOUNTER — Other Ambulatory Visit (HOSPITAL_COMMUNITY): Payer: Self-pay

## 2023-07-15 DIAGNOSIS — J3081 Allergic rhinitis due to animal (cat) (dog) hair and dander: Secondary | ICD-10-CM | POA: Diagnosis not present

## 2023-07-15 DIAGNOSIS — J3089 Other allergic rhinitis: Secondary | ICD-10-CM | POA: Diagnosis not present

## 2023-07-15 DIAGNOSIS — J301 Allergic rhinitis due to pollen: Secondary | ICD-10-CM | POA: Diagnosis not present

## 2023-07-16 ENCOUNTER — Other Ambulatory Visit (HOSPITAL_COMMUNITY): Payer: Self-pay

## 2023-07-16 DIAGNOSIS — Z6834 Body mass index (BMI) 34.0-34.9, adult: Secondary | ICD-10-CM | POA: Diagnosis not present

## 2023-07-16 DIAGNOSIS — K219 Gastro-esophageal reflux disease without esophagitis: Secondary | ICD-10-CM | POA: Diagnosis not present

## 2023-07-20 ENCOUNTER — Other Ambulatory Visit (HOSPITAL_COMMUNITY): Payer: Self-pay

## 2023-07-22 ENCOUNTER — Ambulatory Visit: Payer: Commercial Managed Care - PPO | Admitting: Sports Medicine

## 2023-07-23 ENCOUNTER — Other Ambulatory Visit: Payer: Self-pay | Admitting: Medical

## 2023-07-23 ENCOUNTER — Other Ambulatory Visit (HOSPITAL_COMMUNITY): Payer: Self-pay

## 2023-07-23 DIAGNOSIS — J3089 Other allergic rhinitis: Secondary | ICD-10-CM | POA: Diagnosis not present

## 2023-07-23 DIAGNOSIS — E7849 Other hyperlipidemia: Secondary | ICD-10-CM | POA: Diagnosis not present

## 2023-07-23 DIAGNOSIS — Z6834 Body mass index (BMI) 34.0-34.9, adult: Secondary | ICD-10-CM | POA: Diagnosis not present

## 2023-07-23 DIAGNOSIS — J301 Allergic rhinitis due to pollen: Secondary | ICD-10-CM | POA: Diagnosis not present

## 2023-07-23 DIAGNOSIS — J3081 Allergic rhinitis due to animal (cat) (dog) hair and dander: Secondary | ICD-10-CM | POA: Diagnosis not present

## 2023-07-23 MED ORDER — OSELTAMIVIR PHOSPHATE 75 MG PO CAPS
75.0000 mg | ORAL_CAPSULE | Freq: Every day | ORAL | 0 refills | Status: DC
  Filled 2023-07-23: qty 10, 10d supply, fill #0

## 2023-07-23 MED ORDER — OSELTAMIVIR PHOSPHATE 75 MG PO CAPS: 75.0000 mg | ORAL_CAPSULE | Freq: Every day | ORAL | 0 refills | Status: DC

## 2023-07-29 ENCOUNTER — Ambulatory Visit: Payer: Commercial Managed Care - PPO | Admitting: Sports Medicine

## 2023-07-29 ENCOUNTER — Other Ambulatory Visit: Payer: Self-pay

## 2023-07-29 ENCOUNTER — Encounter: Payer: Self-pay | Admitting: Sports Medicine

## 2023-07-29 ENCOUNTER — Other Ambulatory Visit (INDEPENDENT_AMBULATORY_CARE_PROVIDER_SITE_OTHER): Payer: Self-pay

## 2023-07-29 DIAGNOSIS — M722 Plantar fascial fibromatosis: Secondary | ICD-10-CM

## 2023-07-29 DIAGNOSIS — Z6835 Body mass index (BMI) 35.0-35.9, adult: Secondary | ICD-10-CM | POA: Diagnosis not present

## 2023-07-29 DIAGNOSIS — M79671 Pain in right foot: Secondary | ICD-10-CM

## 2023-07-29 DIAGNOSIS — J3089 Other allergic rhinitis: Secondary | ICD-10-CM | POA: Diagnosis not present

## 2023-07-29 DIAGNOSIS — J3081 Allergic rhinitis due to animal (cat) (dog) hair and dander: Secondary | ICD-10-CM | POA: Diagnosis not present

## 2023-07-29 DIAGNOSIS — D508 Other iron deficiency anemias: Secondary | ICD-10-CM | POA: Diagnosis not present

## 2023-07-29 DIAGNOSIS — J301 Allergic rhinitis due to pollen: Secondary | ICD-10-CM | POA: Diagnosis not present

## 2023-07-29 MED ORDER — BUPIVACAINE HCL 0.25 % IJ SOLN
2.0000 mL | INTRAMUSCULAR | Status: AC | PRN
  Administered 2023-07-29: 2 mL

## 2023-07-29 MED ORDER — BETAMETHASONE SOD PHOS & ACET 6 (3-3) MG/ML IJ SUSP
6.0000 mg | INTRAMUSCULAR | Status: AC | PRN
  Administered 2023-07-29: 6 mg via INTRA_ARTICULAR

## 2023-07-29 NOTE — Progress Notes (Signed)
 Patient says that she was doing pretty well and had nearly no pain, but worked again at [employer] on Sunday and her pain flared back up. She says that it has gotten better since Sunday but is still bothersome. She goes on a 2.5 week trip to [place] in about 1 week and is concerned about being on her feet everyday with her pain.

## 2023-07-29 NOTE — Progress Notes (Signed)
 Michelle Griffin - 53 y.o. female MRN [id number]  Date of birth: 01-28-1971  Office Visit Note: Visit Date: 07/29/2023 PCP: Jac Canavan, PA-C Referred by: Jac Canavan, PA-C  Subjective: Chief Complaint  Patient presents with   Right Foot - Follow-up   HPI: Michelle Griffin is a pleasant 53 y.o. female who presents today for f/u of right foot pain with plantar fasciitis.  Michelle Griffin has made excellent progress with about 4 treatments of extracorporeal shockwave therapy for the plantar fascia in the arch of the foot.  Had been doing well with essentially no pain although when she works long hours at the [employer] her pain flares back up.  She is working this week and then is taking a 2.5-week trip to [place] on a cruise and would like to be feeling well for this.  Pertinent ROS were reviewed with the patient and found to be negative unless otherwise specified above in HPI.   Assessment & Plan: Visit Diagnoses:  1. Plantar fasciitis of right foot   2. Pain of right heel    Plan: Impression is improving but still residual left foot pain at the heel with plantar fasciitis in the arch of the foot.  He has made good strides with her orthotic modification, extracorporeal shockwave therapy but when she has to stand on her feet for long peers of time for work this is exacerbated.  Given this, we will try to resolve her pain with an ultrasound-guided plantar fascia injection, this was performed today and she tolerated well.  She will use ice as well as over-the-counter anti-inflammatories and modified rest/activity for the next 48 hours.  After this she will continue her stretching/home rehab exercises.  Recommended continuing wearing orthotics and good supportive shoe wear. She will message me if she needs a short course of Mobic/Celebrex prior to her trip although hopefully the injection takes care of her pain.  Follow-up: Return if symptoms worsen or fail to improve.   Meds & Orders: No orders of  the defined types were placed in this encounter.   Orders Placed This Encounter  Procedures   Foot Inj: right plantar fascia   US Guided Needle Placement - No Linked Charges   [name] Extrem Low Left Ltd     Procedures: Foot Inj: right plantar fascia  Date/Time: 07/29/2023 9:08 AM  Performed by: Madelyn Brunner, DO Authorized by: Madelyn Brunner, DO   Consent Given by:  Patient Site marked: the procedure site was marked   Timeout: prior to procedure the correct patient, procedure, and site was verified   Indications:  Fasciitis and pain Condition: Plantar Fasciitis   Location: right plantar fascia muscle   Prep: patient was prepped and draped in usual sterile fashion   Needle Size:  22 G and 25 G Medications:  2 mL bupivacaine 0.25 %; 6 mg betamethasone acetate-betamethasone sodium phosphate 6 (3-3) MG/ML Patient Tolerance:  Patient tolerated the procedure well with no immediate complications  *Procedurally-successful US-guided plantar fascia injection, right foot       Clinical History: No specialty comments available.  She reports that she has never smoked. She has never used smokeless tobacco.  Recent Labs    01/22/23 0934 06/11/23 0000  HGBA1C 5.8* 5.6    Objective:   Vital Signs: LMP 03/16/2015   Physical Exam  Gen: Well-appearing, in no acute distress; non-toxic CV: Well-perfused. Warm.  Resp: Breathing unlabored on room air; no wheezing. Psych: Fluid speech in conversation; appropriate affect; normal thought  process  Ortho Exam - Right foot: + Mild TTP over the medial aspect of the calcaneus near the medial band of plantar fascia, there is also some tenderness within the arch of the foot. No redness swelling or effusion.  Imaging: [name] Extrem Low Left Ltd Result Date: 07/29/2023 Limited musculoskeletal ultrasound of the *RIGHT* heel was performed today.  This was incorrectly ordered as left, but was performed on the right heel.  The calcaneus was seen without cortical  regularity.  There is small enthesophyte spurring on the superior aspect.  Plantar fascia was identified at 0.5 cm in nature, which is slightly thickened. *Procedurally successful ultrasound-guided right plantar fascia injection.   Past Medical/Family/Surgical/Social History: Medications & Allergies reviewed per EMR, new medications updated. Patient Active Problem List   Diagnosis Date Noted   High risk medication use 04/30/2021   Anxiety 04/30/2021   Encounter for health maintenance examination in adult 11/05/2020   Vaccine counseling 11/05/2020   Need for pneumococcal vaccination 11/05/2020   Screening for diabetes mellitus 11/05/2020   Gastroesophageal reflux disease 05/29/2020   S/P hysterectomy 05/29/2020   S/P laparoscopic assisted vaginal hysterectomy (LAVH) 03/20/2020   Insomnia 09/08/2019   Need for Td vaccine 02/17/2016   Obesity with serious comorbidity 02/17/2016   Fatty liver 02/13/2015   Vitamin D deficiency 02/13/2015   PMDD (premenstrual dysphoric disorder) 02/13/2015   Hyperlipidemia 02/13/2015   OAB (overactive bladder) 02/13/2015   Rhinitis, allergic 02/13/2015   Asthma, mild persistent 02/13/2015   Screening for lipid disorders 02/13/2015   Impaired fasting blood sugar 02/13/2015   DEGENERATIVE DISC DISEASE, LUMBAR SPINE 11/13/2008   Past Medical History:  Diagnosis Date   Adenocarcinoma in situ of cervix    Allergy    sees Dr. Elma Callas   Anemia    hx/o iron deficiency due to heavy periods   Asthma    exercise induced   Bursitis    Cancer (HCC)    cervical   COVID-19 05/26/2019   fatigue, h/a, loss of taste and smell, fatige x 1 month, loss of taste x 5 months all symptoms rsolved   DDD (degenerative disc disease), lumbar    Fatty liver    GERD (gastroesophageal reflux disease)    Dr. Ewing Schlein   H/O mammogram    Hiatal hernia    small per EGD, Dr. Ewing Schlein   Hyperlipidemia 2012   was on pravastatin prior   Migraine    prior eval with neurologist, Dr.  Vela Prose   Overactive bladder    PMDD (premenstrual dysphoric disorder)    Pyelonephritis    age 56yo   Uterine infection 2015   Vitamin D deficiency    Wears glasses    Family History  Problem Relation Age of Onset   Cataracts Mother    Lupus Mother    Hypertension Mother    Hyperlipidemia Mother    Migraines Mother    Kidney disease Mother    Hypertension Father    Diabetes Father    Hyperlipidemia Father    Depression Father    Anxiety disorder Father    Dementia Father    Obesity Sister    Hypertension Sister    Diabetes Sister    Ovarian cysts Daughter    Heart disease Paternal Uncle        CABG   Hyperlipidemia Paternal Uncle    Cancer Paternal Uncle        multiple with colon cancer   Aplastic anemia Maternal Grandfather    AAA (abdominal  aortic aneurysm) Maternal Grandfather    Cancer Cousin        breast   Cancer Cousin        ovarian   Stroke Neg Hx    Past Surgical History:  Procedure Laterality Date   CERVICAL CONIZATION W/BX N/A 03/05/2020   Procedure: CONIZATION CERVIX WITH BIOPSY;  Surgeon: Huel Cote, MD;  Location: [facility];  Service: Gynecology;  Laterality: N/A;   CHOLECYSTECTOMY  2011   [facility]   COLONOSCOPY  2014   repeat 5 years; Dr. Ewing Schlein, baseline   CYSTOSCOPY N/A 03/20/2020   Procedure: CYSTOSCOPY;  Surgeon: Huel Cote, MD;  Location: [facility];  Service: Gynecology;  Laterality: N/A;   ESOPHAGOGASTRODUODENOSCOPY  2014   Dr. Ewing Schlein   FOOT SURGERY Left 2011   morton's neuroma   LAPAROSCOPIC VAGINAL HYSTERECTOMY WITH SALPINGECTOMY Bilateral 03/20/2020   Procedure: LAPAROSCOPIC ASSISTED VAGINAL HYSTERECTOMY WITH SALPINGECTOMY;  Surgeon: Huel Cote, MD;  Location: [facility];  Service: Gynecology;  Laterality: Bilateral;   LASIK  yrs ago   Dr. Renato Battles   LUMBAR DISC SURGERY  07/2013, [facility]   due to herniated disc, Dr. Ova Freshwater ABLATION  2015    WISDOM TOOTH EXTRACTION  age 53   Social History   Occupational History   Not on file  Tobacco Use   Smoking status: Never   Smokeless tobacco: Never  Vaping Use   Vaping status: Never Used  Substance and Sexual Activity   Alcohol use: Yes    Alcohol/week: 3.0 standard drinks of alcohol    Types: 3 Cans of beer per week    Comment: socially   Drug use: No   Sexual activity: Not Currently    Partners: Male    Comment: partner had vasectomy

## 2023-08-02 ENCOUNTER — Ambulatory Visit
Admission: RE | Admit: 2023-08-02 | Discharge: 2023-08-02 | Disposition: A | Discharge status: Home / Self Care | Payer: 59 | Source: Ambulatory Visit

## 2023-08-02 DIAGNOSIS — Z1231 Encounter for screening mammogram for malignant neoplasm of breast: Secondary | ICD-10-CM | POA: Diagnosis not present

## 2023-08-04 NOTE — Progress Notes (Signed)
 Results sent through MyChart

## 2023-08-06 DIAGNOSIS — J3089 Other allergic rhinitis: Secondary | ICD-10-CM | POA: Diagnosis not present

## 2023-08-06 DIAGNOSIS — J3081 Allergic rhinitis due to animal (cat) (dog) hair and dander: Secondary | ICD-10-CM | POA: Diagnosis not present

## 2023-08-06 DIAGNOSIS — Z6834 Body mass index (BMI) 34.0-34.9, adult: Secondary | ICD-10-CM | POA: Diagnosis not present

## 2023-08-06 DIAGNOSIS — J301 Allergic rhinitis due to pollen: Secondary | ICD-10-CM | POA: Diagnosis not present

## 2023-08-06 DIAGNOSIS — E7849 Other hyperlipidemia: Secondary | ICD-10-CM | POA: Diagnosis not present

## 2023-08-25 ENCOUNTER — Other Ambulatory Visit: Payer: Self-pay | Admitting: Medical

## 2023-08-25 ENCOUNTER — Other Ambulatory Visit: Payer: Self-pay

## 2023-08-25 ENCOUNTER — Other Ambulatory Visit (HOSPITAL_COMMUNITY): Payer: Self-pay

## 2023-08-25 MED ORDER — BREXPIPRAZOLE 2 MG PO TABS
2.0000 mg | ORAL_TABLET | Freq: Every day | ORAL | 0 refills | Status: DC
  Filled 2023-08-25: qty 90, 90d supply, fill #0

## 2023-08-25 MED ORDER — WEGOVY 0.25 MG/0.5ML ~~LOC~~ SOAJ
0.2500 mg | SUBCUTANEOUS | 0 refills | Status: DC
  Filled 2023-08-25 – 2023-08-27 (×2): qty 2, 28d supply, fill #0

## 2023-08-26 ENCOUNTER — Other Ambulatory Visit: Payer: Self-pay

## 2023-08-26 ENCOUNTER — Other Ambulatory Visit (HOSPITAL_COMMUNITY): Payer: Self-pay

## 2023-08-27 ENCOUNTER — Other Ambulatory Visit (HOSPITAL_COMMUNITY): Payer: Self-pay

## 2023-08-27 ENCOUNTER — Ambulatory Visit: Payer: Self-pay

## 2023-08-27 ENCOUNTER — Encounter: Payer: Self-pay | Admitting: *Deleted

## 2023-08-27 ENCOUNTER — Other Ambulatory Visit: Payer: Self-pay

## 2023-08-27 NOTE — Telephone Encounter (Addendum)
 Copied from CRM [id number]. Topic: Clinical - Red Word Triage >> Aug 27, 2023  8:53 AM Michelle Griffin wrote: Red Word that prompted transfer to Nurse Triage: Patient is experiencing swelling on her ankles and feet for about two weeks, with some redness on the area.   Chief Complaint: Swelling to feet and ankles, gets better after elevating.Requests VV. Symptoms: Above Frequency: 1 week Pertinent Negatives: Patient denies  Disposition: [] ED /[] Urgent Care (no appt availability in office) / [x] Appointment(In office/virtual)/ []  [facility]/ [] Home Care/ [] Refused Recommended Disposition /[] [facility]/ []  Follow-up with PCP Additional Notes: Agrees with appointment.  Reason for Disposition  [1] MILD swelling of both ankles (i.e., pedal edema) AND [2] new-onset or worsening  Answer Assessment - Initial Assessment Questions 1. ONSET: "When did the swelling start?" (e.g., minutes, hours, days)     1 week 2. LOCATION: "What part of the leg is swollen?"  "Are both legs swollen or just one leg?"     Both 3. SEVERITY: "How bad is the swelling?" (e.g., localized; mild, moderate, severe)   - Localized: Small area of swelling localized to one leg.   - MILD pedal edema: Swelling limited to foot and ankle, pitting edema < 1/4 inch (6 mm) deep, rest and elevation eliminate most or all swelling.   - MODERATE edema: Swelling of lower leg to knee, pitting edema > 1/4 inch (6 mm) deep, rest and elevation only partially reduce swelling.   - SEVERE edema: Swelling extends above knee, facial or hand swelling present.      Mild 4. REDNESS: "Does the swelling look red or infected?"     No 5. PAIN: "Is the swelling painful to touch?" If Yes, ask: "How painful is it?"   (Scale 1-10; mild, moderate or severe)     No 6. FEVER: "Do you have a fever?" If Yes, ask: "What is it, how was it measured, and when did it start?"      No 7. CAUSE: "What do you think is causing the leg swelling?"      Unsure 8. MEDICAL HISTORY: "Do you have a history of blood clots (e.g., DVT), cancer, heart failure, kidney disease, or liver failure?"     No 9. RECURRENT SYMPTOM: "Have you had leg swelling before?" If Yes, ask: "When was the last time?" "What happened that time?"     No 10. OTHER SYMPTOMS: "Do you have any other symptoms?" (e.g., chest pain, difficulty breathing)       No 11. PREGNANCY: "Is there any chance you are pregnant?" "When was your last menstrual period?"       No  Protocols used: Leg Swelling and Edema-A-AH

## 2023-08-28 ENCOUNTER — Other Ambulatory Visit (HOSPITAL_BASED_OUTPATIENT_CLINIC_OR_DEPARTMENT_OTHER): Payer: Self-pay

## 2023-08-29 NOTE — Progress Notes (Unsigned)
 No chief complaint on file.  Patient presents with complaint of 1-2 weeks of swelling in feet and ankles. Swelling improves with elevation.      PMH, PSH, SH reviewed  Asthma and allergies Postmenopausal HLD, GERD Anxiety and depression   ROS:    PHYSICAL EXAM:  LMP 03/16/2015   Wt Readings from Last 3 Encounters:  01/22/23 218 lb 9.6 oz (99.2 kg)  12/22/22 211 lb (95.7 kg)  08/19/22 190 lb 6.4 oz (86.4 kg)        ASSESSMENT/PLAN:   Ok to release lab orders from [facility], scheduled for 4/4 (and can cancel lab visit)

## 2023-08-30 ENCOUNTER — Other Ambulatory Visit: Payer: Self-pay

## 2023-08-30 ENCOUNTER — Encounter: Payer: Self-pay | Admitting: Family Medicine

## 2023-08-30 ENCOUNTER — Ambulatory Visit: Admitting: Family Medicine

## 2023-08-30 ENCOUNTER — Other Ambulatory Visit: Payer: Self-pay | Admitting: Medical

## 2023-08-30 VITALS — BP 122/74 | HR 80 | Ht 70.0 in | Wt 253.0 lb

## 2023-08-30 DIAGNOSIS — J301 Allergic rhinitis due to pollen: Secondary | ICD-10-CM | POA: Diagnosis not present

## 2023-08-30 DIAGNOSIS — J3081 Allergic rhinitis due to animal (cat) (dog) hair and dander: Secondary | ICD-10-CM | POA: Diagnosis not present

## 2023-08-30 DIAGNOSIS — J3089 Other allergic rhinitis: Secondary | ICD-10-CM | POA: Diagnosis not present

## 2023-08-30 DIAGNOSIS — R718 Other abnormality of red blood cells: Secondary | ICD-10-CM | POA: Diagnosis not present

## 2023-08-30 DIAGNOSIS — R600 Localized edema: Secondary | ICD-10-CM | POA: Diagnosis not present

## 2023-08-30 DIAGNOSIS — E611 Iron deficiency: Secondary | ICD-10-CM | POA: Diagnosis not present

## 2023-08-30 MED ORDER — PANTOPRAZOLE SODIUM 40 MG PO TBEC
40.0000 mg | DELAYED_RELEASE_TABLET | Freq: Every day | ORAL | 1 refills | Status: DC
  Filled 2023-08-30: qty 90, 90d supply, fill #0
  Filled 2023-11-30: qty 90, 90d supply, fill #1

## 2023-08-30 MED ORDER — SERTRALINE HCL 100 MG PO TABS
100.0000 mg | ORAL_TABLET | Freq: Every day | ORAL | 1 refills | Status: DC
  Filled 2023-08-30: qty 90, 90d supply, fill #0
  Filled 2023-11-30: qty 90, 90d supply, fill #1

## 2023-08-30 NOTE — Patient Instructions (Signed)
 Your swelling is likely related to venous insufficiency, and related to travel and increased dietary sodium.  Your exam was normal.   Be sure to limit the sodium in your diet (from processed foods, canned foods, pickles/olives, soy sauce). Wear compression socks with prolonged sitting or standing, especially with travel (where you have less control over your diet).

## 2023-08-31 LAB — CBC WITH DIFFERENTIAL/PLATELET
Basophils Absolute: 0 10*3/uL (ref 0.0–0.2)
Basos: 1 %
EOS (ABSOLUTE): 0 10*3/uL (ref 0.0–0.4)
Eos: 1 %
Hematocrit: 43 % (ref 34.0–46.6)
Hemoglobin: 13.8 g/dL (ref 11.1–15.9)
Immature Grans (Abs): 0 10*3/uL (ref 0.0–0.1)
Immature Granulocytes: 0 %
Lymphocytes Absolute: 1.3 10*3/uL (ref 0.7–3.1)
Lymphs: 20 %
MCH: 26.4 pg — ABNORMAL LOW (ref 26.6–33.0)
MCHC: 32.1 g/dL (ref 31.5–35.7)
MCV: 82 fL (ref 79–97)
Monocytes Absolute: 0.7 10*3/uL (ref 0.1–0.9)
Monocytes: 11 %
Neutrophils Absolute: 4.4 10*3/uL (ref 1.4–7.0)
Neutrophils: 67 %
Platelets: 270 10*3/uL (ref 150–450)
RBC: 5.22 x10E6/uL (ref 3.77–5.28)
RDW: 20.4 % — ABNORMAL HIGH (ref 11.7–15.4)
WBC: 6.4 10*3/uL (ref 3.4–10.8)

## 2023-08-31 LAB — IRON,TIBC AND FERRITIN PANEL
Ferritin: 47 ng/mL (ref 15–150)
Iron Saturation: 42 % (ref 15–55)
Iron: 152 ug/dL (ref 27–159)
Total Iron Binding Capacity: 362 ug/dL (ref 250–450)
UIBC: 210 ug/dL (ref 131–425)

## 2023-08-31 NOTE — Progress Notes (Signed)
 Results sent through MyChart

## 2023-09-03 ENCOUNTER — Other Ambulatory Visit: Payer: Commercial Managed Care - PPO

## 2023-09-08 ENCOUNTER — Telehealth: Admitting: Physician Assistant

## 2023-09-08 DIAGNOSIS — B9689 Other specified bacterial agents as the cause of diseases classified elsewhere: Secondary | ICD-10-CM | POA: Diagnosis not present

## 2023-09-08 DIAGNOSIS — J019 Acute sinusitis, unspecified: Secondary | ICD-10-CM | POA: Diagnosis not present

## 2023-09-08 MED ORDER — AMOXICILLIN-POT CLAVULANATE 875-125 MG PO TABS
1.0000 | ORAL_TABLET | Freq: Two times a day (BID) | ORAL | 0 refills | Status: DC
Start: 2023-09-08 — End: 2024-01-24

## 2023-09-08 NOTE — Progress Notes (Signed)

## 2023-09-14 ENCOUNTER — Other Ambulatory Visit: Payer: Self-pay

## 2023-09-14 ENCOUNTER — Other Ambulatory Visit (HOSPITAL_COMMUNITY): Payer: Self-pay

## 2023-09-14 ENCOUNTER — Other Ambulatory Visit: Payer: Self-pay | Admitting: Medical

## 2023-09-14 MED ORDER — ROSUVASTATIN CALCIUM 10 MG PO TABS
10.0000 mg | ORAL_TABLET | Freq: Every day | ORAL | 1 refills | Status: DC
Start: 2023-09-14 — End: 2024-01-25
  Filled 2023-09-14: qty 90, 90d supply, fill #0
  Filled 2023-12-14: qty 90, 90d supply, fill #1

## 2023-09-14 MED ORDER — FLUTICASONE PROPIONATE 50 MCG/ACT NA SUSP
1.0000 | Freq: Two times a day (BID) | NASAL | 3 refills | Status: AC
Start: 2023-09-14 — End: ?
  Filled 2023-09-14: qty 48, 90d supply, fill #0
  Filled 2024-02-14: qty 48, 90d supply, fill #1

## 2023-09-15 ENCOUNTER — Other Ambulatory Visit: Payer: Self-pay

## 2023-09-15 ENCOUNTER — Other Ambulatory Visit (HOSPITAL_COMMUNITY): Payer: Self-pay

## 2023-09-20 ENCOUNTER — Other Ambulatory Visit: Payer: Self-pay | Admitting: Medical

## 2023-09-20 MED ORDER — WEGOVY 1 MG/0.5ML ~~LOC~~ SOAJ
1.0000 mg | SUBCUTANEOUS | 0 refills | Status: DC
  Filled 2023-09-20: qty 2, 28d supply, fill #0

## 2023-09-21 ENCOUNTER — Other Ambulatory Visit (HOSPITAL_COMMUNITY): Payer: Self-pay

## 2023-09-24 ENCOUNTER — Other Ambulatory Visit: Payer: Self-pay

## 2023-09-24 ENCOUNTER — Other Ambulatory Visit (HOSPITAL_COMMUNITY): Payer: Self-pay

## 2023-10-05 ENCOUNTER — Other Ambulatory Visit: Payer: Self-pay | Admitting: Medical

## 2023-10-05 MED ORDER — ONDANSETRON HCL 4 MG PO TABS
4.0000 mg | ORAL_TABLET | Freq: Three times a day (TID) | ORAL | 0 refills | Status: DC | PRN
  Filled 2023-10-05: qty 90, 30d supply, fill #0

## 2023-10-06 ENCOUNTER — Other Ambulatory Visit (HOSPITAL_COMMUNITY): Payer: Self-pay

## 2023-10-14 ENCOUNTER — Other Ambulatory Visit (HOSPITAL_COMMUNITY): Payer: Self-pay

## 2023-10-21 ENCOUNTER — Encounter: Payer: Self-pay | Admitting: Medical

## 2023-10-21 ENCOUNTER — Other Ambulatory Visit (HOSPITAL_COMMUNITY): Payer: Self-pay

## 2023-10-21 ENCOUNTER — Ambulatory Visit: Admitting: Medical

## 2023-10-21 VITALS — BP 112/68 | HR 86 | Wt 246.8 lb

## 2023-10-21 DIAGNOSIS — R7301 Impaired fasting glucose: Secondary | ICD-10-CM | POA: Diagnosis not present

## 2023-10-21 DIAGNOSIS — E785 Hyperlipidemia, unspecified: Secondary | ICD-10-CM | POA: Diagnosis not present

## 2023-10-21 DIAGNOSIS — E559 Vitamin D deficiency, unspecified: Secondary | ICD-10-CM | POA: Diagnosis not present

## 2023-10-21 DIAGNOSIS — K760 Fatty (change of) liver, not elsewhere classified: Secondary | ICD-10-CM | POA: Diagnosis not present

## 2023-10-21 DIAGNOSIS — M7061 Trochanteric bursitis, right hip: Secondary | ICD-10-CM | POA: Diagnosis not present

## 2023-10-21 DIAGNOSIS — K219 Gastro-esophageal reflux disease without esophagitis: Secondary | ICD-10-CM

## 2023-10-21 LAB — POCT GLYCOSYLATED HEMOGLOBIN (HGB A1C): Hemoglobin A1C: 5.8 % — AB (ref 4.0–5.6)

## 2023-10-21 MED ORDER — MELOXICAM 15 MG PO TABS
15.0000 mg | ORAL_TABLET | Freq: Every day | ORAL | 0 refills | Status: AC
  Filled 2023-10-21 (×2): qty 30, 30d supply, fill #0

## 2023-10-21 MED ORDER — WEGOVY 1.7 MG/0.75ML ~~LOC~~ SOAJ
1.7000 mg | SUBCUTANEOUS | 2 refills | Status: DC
  Filled 2023-10-21 (×2): qty 3, 28d supply, fill #0
  Filled 2023-11-17: qty 3, 28d supply, fill #1

## 2023-10-21 NOTE — Progress Notes (Signed)
 Subjective:  Michelle Griffin is a 53 y.o. female who presents for Chief Complaint  Patient presents with   Medication Refill    Medication check and bursitis     Here for med check  He has been out of pocket for over daily.  She just finished a month of the 1 mg.  She did really well in this last year and got down to the 164 pounds.  She is unfortunately gained some weight back.  She does get quite a bit of nausea with the medication.  She will take up to the next dose 1.7 mg  She is exercising some with walking.  She is compliant with her other medications.  History of impaired glucose, wants to recheck hemoglobin A1c today  She is compliant with her medication for cholesterol Crestor  10 mg daily  She is compliant with the vitamin D  supplement  She does have about 2 weeks of bursitis in the right leg.  She was on vacation recently did use a lot of stairs and not currently.  No other injury or trauma.  She has had a steroid shot before would like this.  No other aggravating or relieving factors.    No other c/o.  Past Medical History:  Diagnosis Date   Adenocarcinoma in situ of cervix    Allergy    sees Dr. Almeda Jacobs   Anemia    hx/o iron  deficiency due to heavy periods   Asthma    exercise induced   Bursitis    Cancer (HCC)    cervical   COVID-19 05/26/2019   fatigue, h/a, loss of taste and smell, fatige x 1 month, loss of taste x 5 months all symptoms rsolved   DDD (degenerative disc disease), lumbar    Fatty liver    GERD (gastroesophageal reflux disease)    Dr. Magod   H/O mammogram    Hiatal hernia    small per EGD, Dr. Lavaughn [place]   Hyperlipidemia 2012   was on pravastatin  prior   Migraine    prior eval with neurologist, Dr. Lamar Pillar   Overactive bladder    PMDD (premenstrual dysphoric disorder)    Pyelonephritis    age 18yo   Uterine infection 2015   Vitamin D  deficiency    Wears glasses    Current Outpatient Medications on File Prior to Visit  Medication Sig  Dispense Refill   albuterol  (VENTOLIN  HFA) 108 (90 Base) MCG/ACT inhaler Inhale 1-2 puffs into the lungs every 4-6 hours as needed for cough or wheeze 6.7 g 2   azelastine  (OPTIVAR ) 0.05 % ophthalmic solution Instill 1 drop into the affected eye(s) twice daily as directed 6 mL 3   Azelastine  HCl 137 MCG/SPRAY SOLN Place 1-2 sprays into both nostrils 2 (two) times daily. 90 mL 3   Blood Pressure Monitoring (OMRON 3 SERIES BP MONITOR) DEVI Use as directed 1 each 0   EPINEPHrine  0.3 mg/0.3 mL IJ SOAJ injection Inject as directed as needed for systemic reaction 2 each 1   Ergocalciferol  (VITAMIN D2) 50 MCG (2000 UT) TABS Take 1 tablet by mouth daily. 90 tablet 3   estradiol  (VIVELLE -DOT) 0.1 MG/24HR patch Place 1 patch (0.1 mg total) onto the skin 2 (two) times a week. 24 patch 4   fluticasone  (FLONASE ) 50 MCG/ACT nasal spray Place 1-2 sprays into both nostrils 1 (one) to 2 (two) times daily as directed. 48 g 3   levocetirizine (XYZAL ) 5 MG tablet Take 1 tablet (5 mg total) by mouth  every evening. 90 tablet 3   montelukast  (SINGULAIR ) 10 MG tablet Take 1 tablet by mouth every evening 90 tablet 20   ondansetron  (ZOFRAN ) 4 MG tablet Take 1 tablet (4 mg total) by mouth every 8 (eight) hours as needed for nausea or vomiting. 90 tablet 0   pantoprazole  (PROTONIX ) 40 MG tablet Take 1 tablet (40 mg total) by mouth daily. 90 tablet 1   Probiotic Product (RESTORA) CAPS Take 1 capsule by mouth daily. 90 capsule 3   rosuvastatin  (CRESTOR ) 10 MG tablet Take 1 tablet (10 mg total) by mouth daily. 90 tablet 1   sertraline  (ZOLOFT ) 100 MG tablet Take 1 tablet (100 mg total) by mouth daily. 90 tablet 1   traZODone  (DESYREL ) 50 MG tablet Take 0.5-1 tablets (25-50 mg total) by mouth at bedtime as needed for sleep. 90 tablet 0   amoxicillin -clavulanate (AUGMENTIN ) 875-125 MG tablet Take 1 tablet by mouth 2 (two) times daily. (Patient not taking: Reported on 10/21/2023) 14 tablet 0   brexpiprazole  (REXULTI ) 2 MG TABS  tablet Take 1 tablet (2 mg total) by mouth daily. (Patient not taking: Reported on 10/21/2023) 90 tablet 0   progesterone  (PROMETRIUM ) 100 MG capsule Take 1 capsule (100 mg total) by mouth at bedtime. (Patient not taking: Reported on 10/21/2023) 30 capsule 2   No current facility-administered medications on file prior to visit.     The following portions of the patient's history were reviewed and updated as appropriate: allergies, current medications, past family history, past medical history, past social history, past surgical history and problem list.  ROS Otherwise as in subjective above      Objective: BP 112/68   Pulse 86   Wt 246 lb 12.8 oz (111.9 kg)   LMP 03/16/2015   SpO2 94%   BMI 35.41 kg/m   BP Readings from Last 3 Encounters:  10/21/23 112/68  08/30/23 122/74  01/22/23 120/70   Wt Readings from Last 3 Encounters:  10/21/23 246 lb 12.8 oz (111.9 kg)  08/30/23 253 lb (114.8 kg)  01/22/23 218 lb 9.6 oz (99.2 kg)    General appearance: alert, no distress, well developed, well nourished Tender over the right trochanteric bursa, otherwise leg nontender, buttock sciatic notch mildly tender, hip range of motion slightly reduced internal range of motion, rest of legs unremarkable Legs neurovascularly intact    Assessment: Encounter Diagnoses  Name Primary?   Trochanteric bursitis of right hip Yes   Impaired fasting blood sugar    Fatty liver    Vitamin D  deficiency    Gastroesophageal reflux disease, unspecified whether esophagitis present    Hyperlipidemia, unspecified hyperlipidemia type      Plan: Trochanteric bursitis of right hip-given that she is trying to lose weight and has impaired glucose I recommend against steroids.  Will begin meloxicam  trial which she has done well with before.  Advise cold therapy, rest, and stretches at home as discussed and per handout  Impaired glucose-update hemoglobin A1c today.  Continue efforts with healthy lifestyle,  regular exercise and healthy diet  Obesity, BMI 35-continue Wegovy , increase to 1.7 mg weekly dose.  Use Zofran  as needed for nausea.  Encouraged more aggressive efforts with exercise  Vitamin D  deficiency-continue vitamin D  supplements  Hyperlipidemia-continue rosuvastatin  Crestor  10 mg daily  GERD-continue Protonix .  Continue efforts to lose weight   Delayne was seen today for medication refill.  Diagnoses and all orders for this visit:  Trochanteric bursitis of right hip  Impaired fasting blood sugar -  HgB A1c  Fatty liver  Vitamin D  deficiency  Gastroesophageal reflux disease, unspecified whether esophagitis present  Hyperlipidemia, unspecified hyperlipidemia type  Other orders -     Semaglutide -Weight Management (WEGOVY ) 1.7 MG/0.75ML SOAJ; Inject 1.7 mg into the skin once a week. -     meloxicam  (MOBIC ) 15 MG tablet; Take 1 tablet (15 mg total) by mouth daily.    Follow up: 2mo

## 2023-10-21 NOTE — Patient Instructions (Addendum)
 Hip Bursitis Rehab Begin meloxicam  anti-inflammatory 1/2 to 1 tablet daily for the next 5 days then as needed Use cold therapy such as bag of frozen peas or ice water to the area 20 minutes twice a day.  Use a cloth between your skin and the ice. Ask your health care provider which exercises are safe for you. Do exercises exactly as told by your health care provider and adjust them as directed. It is normal to feel mild stretching, pulling, tightness, or discomfort as you do these exercises. Stop right away if you feel sudden pain or your pain gets worse. Do not begin these exercises until told by your health care provider. Stretching exercise This exercise warms up your muscles and joints and improves the movement and flexibility of your hip. This exercise also helps to relieve pain and stiffness. Iliotibial band stretch An iliotibial band is a strong band of muscle tissue that runs from the outer side of your hip to the outer side of your thigh and knee. Lie on your side with your left / right leg in the top position. Bend your left / right knee and grab your ankle. Stretch out your bottom arm to help you balance. Slowly bring your knee back so your thigh is slightly behind your body. Slowly lower your knee toward the floor until you feel a gentle stretch on the outside of your left / right thigh. If you do not feel a stretch and your knee will not lower more toward the floor, place the heel of your other foot on top of your knee and pull your knee down toward the floor with your foot. Hold this position for 8 seconds. Slowly return to the starting position. Repeat 3 times. Complete this exercise 2 times a day. Strengthening exercises These exercises build strength and endurance in your hip and pelvis. Endurance is the ability to use your muscles for a long time, even after they get tired. Bridge This exercise strengthens the muscles that move your thigh backward (hip extensors). Lie on your  back on a firm surface with your knees bent and your feet flat on the floor. Tighten your buttocks muscles and lift your buttocks off the floor until your trunk is level with your thighs. Do not arch your back. You should feel the muscles working in your buttocks and the back of your thighs. If you do not feel these muscles, slide your feet 1-2 inches (2.5-5 cm) farther away from your buttocks. If this exercise is too easy, try doing it with your arms crossed over your chest. Hold this position for 8 seconds. Slowly return to the starting position. Repeat 3 times. Complete this exercise 2 times a day. Squats This exercise strengthens the muscles in front of your thigh and knee (quadriceps). Stand in front of a table, with your feet and knees pointing straight ahead. You may rest your hands on the table for balance but not for support. Slowly bend your knees and lower your hips like you are going to sit in a chair. Keep your weight over your heels, not over your toes. Keep your lower legs upright so they are parallel with the table legs. Do not let your hips go lower than your knees. Do not bend lower than told by your health care provider. If your hip pain increases, do not bend as low. Hold this position for 8 seconds. Slowly return to the starting position. Repeat 3 times. Complete this exercise 2 times a day. Hip  hike  Stand sideways on a bottom step. Stand on your left / right leg with your other foot unsupported next to the step. You can hold on to the railing or wall for balance if needed. Keep your knees straight and your torso square. Then lift your left / right hip up toward the ceiling. Hold this position for 8 seconds. Slowly return to the starting position. Repeat 3 times. Complete this exercise 2 times a day. Single leg stand This exercise increases your balance. Without shoes, stand near a railing or in a doorway. You may hold on to the railing or door frame as needed for  balance. Squeeze your left / right buttock muscles, then lift up your other foot. Do not let your left / right hip push out to the side. It is helpful to stand in front of a mirror for this exercise so you can watch your hip. Hold this position for 8 seconds. Slowly return to the starting position. Repeat 3 times. Complete this exercise 2 times a day. This information is not intended to replace advice given to you by your health care provider. Make sure you discuss any questions you have with your health care provider. Document Revised: 04/30/2021 Document Reviewed: 04/30/2021 Elsevier Patient Education  2024 [employer].

## 2023-10-27 ENCOUNTER — Other Ambulatory Visit (HOSPITAL_COMMUNITY): Payer: Self-pay

## 2023-11-08 DIAGNOSIS — L821 Other seborrheic keratosis: Secondary | ICD-10-CM | POA: Diagnosis not present

## 2023-11-08 DIAGNOSIS — L814 Other melanin hyperpigmentation: Secondary | ICD-10-CM | POA: Diagnosis not present

## 2023-11-08 DIAGNOSIS — L570 Actinic keratosis: Secondary | ICD-10-CM | POA: Diagnosis not present

## 2023-11-08 DIAGNOSIS — D3617 Benign neoplasm of peripheral nerves and autonomic nervous system of trunk, unspecified: Secondary | ICD-10-CM | POA: Diagnosis not present

## 2023-11-08 DIAGNOSIS — D485 Neoplasm of uncertain behavior of skin: Secondary | ICD-10-CM | POA: Diagnosis not present

## 2023-11-17 ENCOUNTER — Other Ambulatory Visit (HOSPITAL_COMMUNITY): Payer: Self-pay

## 2023-11-17 ENCOUNTER — Other Ambulatory Visit: Payer: Self-pay

## 2023-11-22 ENCOUNTER — Other Ambulatory Visit: Payer: Self-pay | Admitting: Medical

## 2023-11-22 MED ORDER — BREXPIPRAZOLE 2 MG PO TABS
2.0000 mg | ORAL_TABLET | Freq: Every day | ORAL | 0 refills | Status: DC
  Filled 2023-11-22: qty 90, 90d supply, fill #0

## 2023-11-23 ENCOUNTER — Other Ambulatory Visit: Payer: Self-pay

## 2023-11-23 ENCOUNTER — Other Ambulatory Visit (HOSPITAL_COMMUNITY): Payer: Self-pay

## 2023-11-30 ENCOUNTER — Other Ambulatory Visit: Payer: Self-pay

## 2023-11-30 ENCOUNTER — Other Ambulatory Visit (INDEPENDENT_AMBULATORY_CARE_PROVIDER_SITE_OTHER): Payer: Self-pay

## 2023-11-30 DIAGNOSIS — H35033 Hypertensive retinopathy, bilateral: Secondary | ICD-10-CM

## 2023-11-30 DIAGNOSIS — H3562 Retinal hemorrhage, left eye: Secondary | ICD-10-CM

## 2023-11-30 NOTE — Progress Notes (Signed)
 [facility] - Clinic Note  11/30/2023     CHIEF COMPLAINT Patient presents for No chief complaint on file.   HISTORY OF PRESENT ILLNESS: Michelle Griffin is a 53 y.o. female who presents to the clinic today for:       Referring physician: Waylan Cain, MD [address] [place],  [place] [address]  HISTORICAL INFORMATION:   Selected notes from the MEDICAL RECORD NUMBER Referred by Dr. Waylan for retinal hemorrhages OS LEE:  Ocular Hx- PMH-    CURRENT MEDICATIONS: Current Outpatient Medications (Ophthalmic Drugs)  Medication Sig   azelastine  (OPTIVAR ) 0.05 % ophthalmic solution Instill 1 drop into the affected eye(s) twice daily as directed   No current facility-administered medications for this visit. (Ophthalmic Drugs)   Current Outpatient Medications (Other)  Medication Sig   albuterol  (VENTOLIN  HFA) 108 (90 Base) MCG/ACT inhaler Inhale 1-2 puffs into the lungs every 4-6 hours as needed for cough or wheeze   amoxicillin -clavulanate (AUGMENTIN ) 875-125 MG tablet Take 1 tablet by mouth 2 (two) times daily. (Patient not taking: Reported on 10/21/2023)   Azelastine  HCl 137 MCG/SPRAY SOLN Place 1-2 sprays into both nostrils 2 (two) times daily.   Blood Pressure Monitoring (OMRON 3 SERIES BP MONITOR) DEVI Use as directed   brexpiprazole  (REXULTI ) 2 MG TABS tablet Take 1 tablet (2 mg total) by mouth daily.   EPINEPHrine  0.3 mg/0.3 mL IJ SOAJ injection Inject as directed as needed for systemic reaction   Ergocalciferol  (VITAMIN D2) 50 MCG (2000 UT) TABS Take 1 tablet by mouth daily.   estradiol  (VIVELLE -DOT) 0.1 MG/24HR patch Place 1 patch (0.1 mg total) onto the skin 2 (two) times a week.   fluticasone  (FLONASE ) 50 MCG/ACT nasal spray Place 1-2 sprays into both nostrils 1 (one) to 2 (two) times daily as directed.   levocetirizine (XYZAL ) 5 MG tablet Take 1 tablet (5 mg total) by mouth every evening.   meloxicam  (MOBIC ) 15 MG tablet Take 1 tablet (15 mg total) by  mouth daily.   montelukast  (SINGULAIR ) 10 MG tablet Take 1 tablet by mouth every evening   ondansetron  (ZOFRAN ) 4 MG tablet Take 1 tablet (4 mg total) by mouth every 8 (eight) hours as needed for nausea or vomiting.   pantoprazole  (PROTONIX ) 40 MG tablet Take 1 tablet (40 mg total) by mouth daily.   Probiotic Product (RESTORA) CAPS Take 1 capsule by mouth daily.   progesterone  (PROMETRIUM ) 100 MG capsule Take 1 capsule (100 mg total) by mouth at bedtime. (Patient not taking: Reported on 10/21/2023)   rosuvastatin  (CRESTOR ) 10 MG tablet Take 1 tablet (10 mg total) by mouth daily.   Semaglutide -Weight Management (WEGOVY ) 1.7 MG/0.75ML SOAJ Inject 1.7 mg into the skin once a week.   sertraline  (ZOLOFT ) 100 MG tablet Take 1 tablet (100 mg total) by mouth daily.   traZODone  (DESYREL ) 50 MG tablet Take 0.5-1 tablets (25-50 mg total) by mouth at bedtime as needed for sleep.   No current facility-administered medications for this visit. (Other)   REVIEW OF SYSTEMS:     ALLERGIES Allergies  Allergen Reactions   Sulfa Antibiotics Hives   Ambien  [Zolpidem ]     Nightmares/vivid dreams   Percocet [Oxycodone -Acetaminophen ] Itching    Oxycodone   makes me climb the wall, itching   PAST MEDICAL HISTORY Past Medical History:  Diagnosis Date   Adenocarcinoma in situ of cervix    Allergy    sees Dr. Frutoso   Anemia    hx/o iron  deficiency  due to heavy periods   Asthma    exercise induced   Bursitis    Cancer (HCC)    cervical   COVID-19 05/26/2019   fatigue, h/a, loss of taste and smell, fatige x 1 month, loss of taste x 5 months all symptoms rsolved   DDD (degenerative disc disease), lumbar    Fatty liver    GERD (gastroesophageal reflux disease)    Dr. Rosalie   H/O mammogram    Hiatal hernia    small per EGD, Dr. Rosalie   Hyperlipidemia 2012   was on pravastatin  prior   Migraine    prior eval with neurologist, Dr. Lindy   Overactive bladder    PMDD (premenstrual dysphoric  disorder)    Pyelonephritis    age 73yo   Uterine infection 2015   Vitamin D  deficiency    Wears glasses    Past Surgical History:  Procedure Laterality Date   CERVICAL CONIZATION W/BX N/A 03/05/2020   Procedure: CONIZATION CERVIX WITH BIOPSY;  Surgeon: Estelle Service, MD;  Location: [facility];  Service: Gynecology;  Laterality: N/A;   CHOLECYSTECTOMY  2011   [facility]   COLONOSCOPY  2014   repeat 5 years; Dr. Rosalie, baseline   CYSTOSCOPY N/A 03/20/2020   Procedure: CYSTOSCOPY;  Surgeon: Estelle Service, MD;  Location: [facility];  Service: Gynecology;  Laterality: N/A;   ESOPHAGOGASTRODUODENOSCOPY  2014   Dr. Rosalie   FOOT SURGERY Left 2011   morton's neuroma   LAPAROSCOPIC VAGINAL HYSTERECTOMY WITH SALPINGECTOMY Bilateral 03/20/2020   Procedure: LAPAROSCOPIC ASSISTED VAGINAL HYSTERECTOMY WITH SALPINGECTOMY;  Surgeon: Estelle Service, MD;  Location[facility] ;  Service: Gynecology;  Laterality: Bilateral;   LASIK  yrs ago   Dr. Osvaldo   LUMBAR DISC SURGERY  07/2013, [facility]   due to herniated disc, Dr. Beuford PUFFER ABLATION  2015   WISDOM TOOTH EXTRACTION  age 38   FAMILY HISTORY Family History  Problem Relation Age of Onset   Cataracts Mother    Lupus Mother    Hypertension Mother    Hyperlipidemia Mother    Migraines Mother    Kidney disease Mother    Hypertension Father    Diabetes Father    Hyperlipidemia Father    Depression Father    Anxiety disorder Father    Dementia Father    Obesity Sister    Hypertension Sister    Diabetes Sister    Ovarian cysts Daughter    Heart disease Paternal Uncle        CABG   Hyperlipidemia Paternal Uncle    Cancer Paternal Uncle        multiple with colon cancer   Aplastic anemia Maternal Grandfather    AAA (abdominal aortic aneurysm) Maternal Grandfather    Cancer Cousin        breast   Cancer Cousin        ovarian   Stroke Neg Hx    SOCIAL  HISTORY Social History   Tobacco Use   Smoking status: Never   Smokeless tobacco: Never  Vaping Use   Vaping status: Never Used  Substance Use Topics   Alcohol use: Yes    Alcohol/week: 3.0 standard drinks of alcohol    Types: 3 Cans of beer per week    Comment: socially   Drug use: No       OPHTHALMIC EXAM:  Not recorded    IMAGING AND PROCEDURES  Imaging and Procedures  for 11/30/2023          ASSESSMENT/PLAN:  No diagnosis found.  1. Retinal hemorrhages OS  - mild MA DBH superior periphery -- improved - incidental finding on dilated exam (4.14.23) w/ Dr. Waylan  - today, BCVA 20/20 OU and pt asymptomatic  - exam shows attenuated and tortuous venules just outside ST arcades amidst the retinal hemorrhages -- appearance suggestive of mild remote BRVO  - pt denies history of HTN, DM2, blood clots  - is on Wegovy  for weight loss and estradiol  patch for hormone replacement post hysterectomy (hx of cervical ca)  - OCT w/o retinal edema  - FA 4.18.23 without leakage or vasculitis  - repeat Optos color and FAF images obtained today -- show interval improvement in peripheral DBH  - no retinal or ophthalmic interventions indicated or recommended  - monitor  - f/u in 1 year, sooner prn -- DFE/OCT, repeat optos colors / FAF, possible FA  2. Hypertensive retinopathy OU  - pt does not carry formal diagnosis of HTN, and is not on any BP meds - discussed importance of tight BP control - recommend home monitoring of BP w/ home cuff - continue to monitor  3. History of LASIK OU  - stable  Ophthalmic Meds Ordered this visit:  No orders of the defined types were placed in this encounter.    No follow-ups on file.  There are no Patient Instructions on file for this visit.   Explained the diagnoses, plan, and follow up with the patient and they expressed understanding.  Patient expressed understanding of the importance of proper follow up care.   This document serves as a  record of services personally performed by Redell JUDITHANN Hans, MD, PhD. It was created on their behalf by Almetta Pesa, an ophthalmic technician. The creation of this record is the provider's dictation and/or activities during the visit.    Electronically signed by: Almetta Pesa, OA, 11/30/23  10:05 AM   Redell JUDITHANN Hans, M.D., Ph.D. Diseases & Surgery of the Retina and Vitreous [facility]   Abbreviations: M myopia (nearsighted); A astigmatism; H hyperopia (farsighted); P presbyopia; Mrx spectacle prescription;  CTL contact lenses; OD right eye; OS left eye; OU both eyes  XT exotropia; ET esotropia; PEK punctate epithelial keratitis; PEE punctate epithelial erosions; DES dry eye syndrome; MGD meibomian gland dysfunction; ATs artificial tears; PFAT's preservative free artificial tears; NSC nuclear sclerotic cataract; PSC posterior subcapsular cataract; ERM epi-retinal membrane; PVD posterior vitreous detachment; RD retinal detachment; DM diabetes mellitus; DR diabetic retinopathy; NPDR non-proliferative diabetic retinopathy; PDR proliferative diabetic retinopathy; CSME clinically significant macular edema; DME diabetic macular edema; dbh dot blot hemorrhages; CWS cotton wool spot; POAG primary open angle glaucoma; C/D cup-to-disc ratio; HVF humphrey visual field; GVF goldmann visual field; OCT optical coherence tomography; IOP intraocular pressure; BRVO Branch retinal vein occlusion; CRVO central retinal vein occlusion; CRAO central retinal artery occlusion; BRAO branch retinal artery occlusion; RT retinal tear; SB scleral buckle; PPV pars plana vitrectomy; VH Vitreous hemorrhage; PRP panretinal laser photocoagulation; IVK intravitreal kenalog ; VMT vitreomacular traction; MH Macular hole;  NVD neovascularization of the disc; NVE neovascularization elsewhere; AREDS age related eye disease study; ARMD age related macular degeneration; POAG primary open angle glaucoma; EBMD  epithelial/anterior basement membrane dystrophy; ACIOL anterior chamber intraocular lens; IOL intraocular lens; PCIOL posterior chamber intraocular lens; Phaco/IOL phacoemulsification with intraocular lens placement; PRK photorefractive keratectomy; LASIK laser assisted in situ keratomileusis; HTN hypertension; DM diabetes mellitus; COPD chronic obstructive  pulmonary disease

## 2023-12-09 NOTE — Progress Notes (Signed)
 [facility] - Clinic Note  12/15/2023     CHIEF COMPLAINT Patient presents for Retina Follow Up   HISTORY OF PRESENT ILLNESS: Michelle Griffin is a 53 y.o. female who presents to the clinic today for:   HPI     Retina Follow Up   Patient presents with  Other.  In left eye.  This started 1 year ago.  I, the attending physician,  performed the HPI with the patient and updated documentation appropriately.        Comments   Patient here for 1 year retina follow up for retinal hemes OS. Patient states vision sometimes can't focus. There is like a not a pain bit like irritation.       Last edited by Valdemar Rogue, MD on 12/19/2023  1:55 AM.    Pt states she has a hard time focusing with her left eye sometimes    Referring physician: Waylan Cain, MD [address] [place],  [place] [address]  HISTORICAL INFORMATION:   Selected notes from the MEDICAL RECORD NUMBER Referred by Dr. Waylan for retinal hemorrhages OS LEE:  Ocular Hx- PMH-    CURRENT MEDICATIONS: Current Outpatient Medications (Ophthalmic Drugs)  Medication Sig   azelastine  (OPTIVAR ) 0.05 % ophthalmic solution Instill 1 drop into the affected eye(s) twice daily as directed   No current facility-administered medications for this visit. (Ophthalmic Drugs)   Current Outpatient Medications (Other)  Medication Sig   albuterol  (VENTOLIN  HFA) 108 (90 Base) MCG/ACT inhaler Inhale 1-2 puffs into the lungs every 4-6 hours as needed for cough or wheeze   Azelastine  HCl 137 MCG/SPRAY SOLN Place 1-2 sprays into both nostrils 2 (two) times daily.   Blood Pressure Monitoring (OMRON 3 SERIES BP MONITOR) DEVI Use as directed   brexpiprazole  (REXULTI ) 2 MG TABS tablet Take 1 tablet (2 mg total) by mouth daily.   EPINEPHrine  0.3 mg/0.3 mL IJ SOAJ injection Inject as directed as needed for systemic reaction   Ergocalciferol  (VITAMIN D2) 50 MCG (2000 UT) TABS Take 1 tablet by mouth daily.   estradiol  (VIVELLE -DOT)  0.1 MG/24HR patch Place 1 patch (0.1 mg total) onto the skin 2 (two) times a week.   fluticasone  (FLONASE ) 50 MCG/ACT nasal spray Place 1-2 sprays into both nostrils 1 (one) to 2 (two) times daily as directed.   levocetirizine (XYZAL ) 5 MG tablet Take 1 tablet (5 mg total) by mouth every evening.   meloxicam  (MOBIC ) 15 MG tablet Take 1 tablet (15 mg total) by mouth daily.   montelukast  (SINGULAIR ) 10 MG tablet Take 1 tablet by mouth every evening   ondansetron  (ZOFRAN ) 4 MG tablet Take 1 tablet (4 mg total) by mouth every 8 (eight) hours as needed for nausea or vomiting.   pantoprazole  (PROTONIX ) 40 MG tablet Take 1 tablet (40 mg total) by mouth daily.   Probiotic Product (RESTORA) CAPS Take 1 capsule by mouth daily.   rosuvastatin  (CRESTOR ) 10 MG tablet Take 1 tablet (10 mg total) by mouth daily.   Semaglutide -Weight Management (WEGOVY ) 1.7 MG/0.75ML SOAJ Inject 1.7 mg into the skin once a week.   sertraline  (ZOLOFT ) 100 MG tablet Take 1 tablet (100 mg total) by mouth daily.   traZODone  (DESYREL ) 50 MG tablet Take 0.5-1 tablets (25-50 mg total) by mouth at bedtime as needed for sleep.   amoxicillin -clavulanate (AUGMENTIN ) 875-125 MG tablet Take 1 tablet by mouth 2 (two) times daily. (Patient not taking: Reported on 12/15/2023)   progesterone  (PROMETRIUM ) 100 MG  capsule Take 1 capsule (100 mg total) by mouth at bedtime. (Patient not taking: Reported on 12/15/2023)   No current facility-administered medications for this visit. (Other)   REVIEW OF SYSTEMS: ROS   Positive for: Cardiovascular, Eyes Negative for: Constitutional, Gastrointestinal, Neurological, Skin, Genitourinary, Musculoskeletal, HENT, Endocrine, Respiratory, Psychiatric, Allergic/Imm, Heme/Lymph Last edited by Orval Asberry RAMAN, COA on 12/15/2023  7:52 AM.        ALLERGIES Allergies  Allergen Reactions   Sulfa Antibiotics Hives   Ambien  [Zolpidem ]     Nightmares/vivid dreams   Percocet [Oxycodone -Acetaminophen ] Itching     Oxycodone   makes me climb the wall, itching   PAST MEDICAL HISTORY Past Medical History:  Diagnosis Date   Adenocarcinoma in situ of cervix    Allergy    sees Dr. Frutoso   Anemia    hx/o iron  deficiency due to heavy periods   Asthma    exercise induced   Bursitis    Cancer (HCC)    cervical   COVID-19 05/26/2019   fatigue, h/a, loss of taste and smell, fatige x 1 month, loss of taste x 5 months all symptoms rsolved   DDD (degenerative disc disease), lumbar    Fatty liver    GERD (gastroesophageal reflux disease)    Dr. Rosalie   H/O mammogram    Hiatal hernia    small per EGD, Dr. Rosalie   Hyperlipidemia 2012   was on pravastatin  prior   Migraine    prior eval with neurologist, Dr. Lindy   Overactive bladder    PMDD (premenstrual dysphoric disorder)    Pyelonephritis    age 36yo   Uterine infection 2015   Vitamin D  deficiency    Wears glasses    Past Surgical History:  Procedure Laterality Date   CERVICAL CONIZATION W/BX N/A 03/05/2020   Procedure: CONIZATION CERVIX WITH BIOPSY;  Surgeon: Estelle Service, MD;  Location: [facility] [place];  Service: Gynecology;  Laterality: N/A;   CHOLECYSTECTOMY  2011   lapa   COLONOSCOPY  2014   repeat 5 years; Dr. Rosalie, baseline   CYSTOSCOPY N/A 03/20/2020   Procedure: CYSTOSCOPY;  Surgeon: Estelle Service, MD;  Location: [facility];  Service: Gynecology;  Laterality: N/A;   ESOPHAGOGASTRODUODENOSCOPY  2014   Dr. Rosalie   FOOT SURGERY Left 2011   morton's neuroma   LAPAROSCOPIC VAGINAL HYSTERECTOMY WITH SALPINGECTOMY Bilateral 03/20/2020   Procedure: LAPAROSCOPIC ASSISTED VAGINAL HYSTERECTOMY WITH SALPINGECTOMY;  Surgeon: Estelle Service, MD;  Location: [facility] [place];  Service: Gynecology;  Laterality: Bilateral;   LASIK  yrs ago   Dr. Osvaldo   LUMBAR DISC SURGERY  07/2013, [facility]   due to herniated disc, Dr. Beuford PUFFER ABLATION  2015   WISDOM TOOTH  EXTRACTION  age 53   FAMILY HISTORY Family History  Problem Relation Age of Onset   Cataracts Mother    Lupus Mother    Hypertension Mother    Hyperlipidemia Mother    Migraines Mother    Kidney disease Mother    Hypertension Father    Diabetes Father    Hyperlipidemia Father    Depression Father    Anxiety disorder Father    Dementia Father    Obesity Sister    Hypertension Sister    Diabetes Sister    Ovarian cysts Daughter    Heart disease Paternal Uncle        CABG   Hyperlipidemia Paternal Uncle    Cancer Paternal Uncle  multiple with colon cancer   Aplastic anemia Maternal Grandfather    AAA (abdominal aortic aneurysm) Maternal Grandfather    Cancer Cousin        breast   Cancer Cousin        ovarian   Stroke Neg Hx    SOCIAL HISTORY Social History   Tobacco Use   Smoking status: Never   Smokeless tobacco: Never  Vaping Use   Vaping status: Never Used  Substance Use Topics   Alcohol use: Yes    Alcohol/week: 3.0 standard drinks of alcohol    Types: 3 Cans of beer per week    Comment: socially   Drug use: No       OPHTHALMIC EXAM:  Base Eye Exam     Visual Acuity (Snellen - Linear)       Right Left   Dist [place] 20/20 20/20 -1         Tonometry (Tonopen, 7:50 AM)       Right Left   Pressure 22 20         Pupils       Dark Light Shape React APD   Right 3 2 Round Brisk None   Left 3 2 Round Brisk None         Visual Fields (Counting fingers)       Left Right    Full Full         Extraocular Movement       Right Left    Full, Ortho Full, Ortho         Neuro/Psych     Oriented x3: Yes   Mood/Affect: Normal         Dilation     Both eyes: 1.0% Mydriacyl, 2.5% Phenylephrine  @ 7:50 AM           Slit Lamp and Fundus Exam     Slit Lamp Exam       Right Left   Lids/Lashes Normal Dermatochalasis - upper lid   Conjunctiva/Sclera White and quiet White and quiet   Cornea well healed lasik flap, mild tear  film debris well healed lasik flap, fine tear film debris   Anterior Chamber deep and clear deep and clear   Iris Round and dilated Round and dilated   Lens Clear Clear   Anterior Vitreous clear mild syneresis         Fundus Exam       Right Left   Disc Pink and Sharp Pink and Sharp, mild PPP   C/D Ratio 0.3 0.3   Macula Flat, Good foveal reflex, No heme or edema Flat, Good foveal reflex, RPE mottling, No heme or edema   Vessels mild attenuation, Tortuous mild attenuation, mild copper wiring, mild tortuosity, mild AV crossing changes, Telangiectasia off ST venules in superior quad, blot heme at 0200 -- improved from prior   Periphery Attached, No heme Attached, attenuated and tortuous venules / +telangectasia just outside ST arcades with interval improvement in DBH, isolated blot heme at 0200 -- improved           Refraction     Wearing Rx       Sphere Cylinder Axis   Right +0.50 +0.75 154   Left +0.50 +0.25 066           IMAGING AND PROCEDURES  Imaging and Procedures for 12/15/2023  OCT, Retina - OU - Both Eyes       Right Eye Quality was good. Central Foveal Thickness:  270. Progression has been stable. Findings include normal foveal contour, no IRF, no SRF, vitreomacular adhesion .   Left Eye Quality was good. Central Foveal Thickness: 271. Progression has been stable. Findings include normal foveal contour, no IRF, no SRF, vitreomacular adhesion .   Notes *Images captured and stored on drive  Diagnosis / Impression:  NFP, no IRF/SRF OU  Clinical management:  See below  Abbreviations: NFP - Normal foveal profile. CME - cystoid macular edema. PED - pigment epithelial detachment. IRF - intraretinal fluid. SRF - subretinal fluid. EZ - ellipsoid zone. ERM - epiretinal membrane. ORA - outer retinal atrophy. ORT - outer retinal tubulation. SRHM - subretinal hyper-reflective material. IRHM - intraretinal hyper-reflective material      Color Fundus Photography  Optos - OU - Both Eyes       Right Eye Progression has been stable. Disc findings include normal observations. Macula : normal observations. Vessels : normal observations. Periphery : normal observations.   Left Eye Progression has improved. Disc findings include normal observations. Macula : normal observations. Vessels : tortuous vessels (Focal attenuation, tortuosity and telangectasias superior midzone just outside ST arcades). Periphery : hemorrhage (stable improvement in DBH, persistent blot heme at 0200 -- improved).   Notes **Images stored on drive**  Impression: OS:   Focal cluster of MA DBH + vascular attenuation, tortuosity and telangectasias superior midzone just outside ST arcades, stable improvement in DBH, persistent blot heme at 0200 -- improved           ASSESSMENT/PLAN:    ICD-10-CM   1. Retinal hemorrhage of left eye  H35.62 OCT, Retina - OU - Both Eyes    Color Fundus Photography Optos - OU - Both Eyes    2. Hypertensive retinopathy of both eyes  H35.033     3. Hx of LASIK  Z98.890       1. Retinal hemorrhages OS  - mild MA DBH superior periphery -- improved - incidental finding on dilated exam (4.14.23) w/ Dr. Waylan  - today, BCVA 20/20 OU and pt asymptomatic  - exam shows attenuated and tortuous venules just outside ST arcades amidst the retinal hemorrhages -- appearance suggestive of mild remote BRVO  - pt denies history of HTN, DM2, blood clots  - is on Wegovy  for weight loss and estradiol  patch for hormone replacement post hysterectomy (hx of cervical ca)  - OCT w/o retinal edema  - FA 4.18.23 without leakage or vasculitis  - repeat Optos color and FAF images obtained today -- show interval improvement in peripheral DBH  - no retinal or ophthalmic interventions indicated or recommended  - monitor  - f/u in 1 year, sooner prn -- DFE/OCT, repeat optos colors / FAF, possible FA  2. Hypertensive retinopathy OU  - pt does not carry formal diagnosis of  HTN, and is not on any BP meds - discussed importance of tight BP control - recommend home monitoring of BP w/ home cuff - continue to monitor  3. History of LASIK OU  - stable  Ophthalmic Meds Ordered this visit:  No orders of the defined types were placed in this encounter.    Return in about 1 year (around 12/14/2024) for f/u retinal hemorrhages OS, DFE, OCT, OPTOS color images, possible FA.  There are no Patient Instructions on file for this visit.   Explained the diagnoses, plan, and follow up with the patient and they expressed understanding.  Patient expressed understanding of the importance of proper follow up care.  This document serves as a record of services personally performed by Redell JUDITHANN Hans, MD, PhD. It was created on their behalf by Almetta Pesa, an ophthalmic technician. The creation of this record is the provider's dictation and/or activities during the visit.    Electronically signed by: Almetta Pesa, OA, 12/19/23  1:56 AM  This document serves as a record of services personally performed by Redell JUDITHANN Hans, MD, PhD. It was created on their behalf by Alan PARAS. Delores, OA an ophthalmic technician. The creation of this record is the provider's dictation and/or activities during the visit.    Electronically signed by: Alan PARAS. Delores, OA 12/19/23 1:56 AM   Redell JUDITHANN Hans, M.D., Ph.D. Diseases & Surgery of the Retina and Vitreous [facility]  I have reviewed the above documentation for accuracy and completeness, and I agree with the above. Redell JUDITHANN Hans, M.D., Ph.D. 12/19/23 1:57 AM   Abbreviations: M myopia (nearsighted); A astigmatism; H hyperopia (farsighted); P presbyopia; Mrx spectacle prescription;  CTL contact lenses; OD right eye; OS left eye; OU both eyes  XT exotropia; ET esotropia; PEK punctate epithelial keratitis; PEE punctate epithelial erosions; DES dry eye syndrome; MGD meibomian gland dysfunction; ATs artificial  tears; PFAT's preservative free artificial tears; NSC nuclear sclerotic cataract; PSC posterior subcapsular cataract; ERM epi-retinal membrane; PVD posterior vitreous detachment; RD retinal detachment; DM diabetes mellitus; DR diabetic retinopathy; NPDR non-proliferative diabetic retinopathy; PDR proliferative diabetic retinopathy; CSME clinically significant macular edema; DME diabetic macular edema; dbh dot blot hemorrhages; CWS cotton wool spot; POAG primary open angle glaucoma; C/D cup-to-disc ratio; HVF humphrey visual field; GVF goldmann visual field; OCT optical coherence tomography; IOP intraocular pressure; BRVO Branch retinal vein occlusion; CRVO central retinal vein occlusion; CRAO central retinal artery occlusion; BRAO branch retinal artery occlusion; RT retinal tear; SB scleral buckle; PPV pars plana vitrectomy; VH Vitreous hemorrhage; PRP panretinal laser photocoagulation; IVK intravitreal kenalog ; VMT vitreomacular traction; MH Macular hole;  NVD neovascularization of the disc; NVE neovascularization elsewhere; AREDS age related eye disease study; ARMD age related macular degeneration; POAG primary open angle glaucoma; EBMD epithelial/anterior basement membrane dystrophy; ACIOL anterior chamber intraocular lens; IOL intraocular lens; PCIOL posterior chamber intraocular lens; Phaco/IOL phacoemulsification with intraocular lens placement; PRK photorefractive keratectomy; LASIK laser assisted in situ keratomileusis; HTN hypertension; DM diabetes mellitus; COPD chronic obstructive pulmonary disease

## 2023-12-13 ENCOUNTER — Other Ambulatory Visit (HOSPITAL_COMMUNITY): Payer: Self-pay

## 2023-12-13 DIAGNOSIS — E2839 Other primary ovarian failure: Secondary | ICD-10-CM | POA: Diagnosis not present

## 2023-12-13 DIAGNOSIS — E8941 Symptomatic postprocedural ovarian failure: Secondary | ICD-10-CM | POA: Diagnosis not present

## 2023-12-13 DIAGNOSIS — Z1389 Encounter for screening for other disorder: Secondary | ICD-10-CM | POA: Diagnosis not present

## 2023-12-13 DIAGNOSIS — Z8541 Personal history of malignant neoplasm of cervix uteri: Secondary | ICD-10-CM | POA: Diagnosis not present

## 2023-12-13 DIAGNOSIS — Z01419 Encounter for gynecological examination (general) (routine) without abnormal findings: Secondary | ICD-10-CM | POA: Diagnosis not present

## 2023-12-13 MED ORDER — ESTRADIOL 0.1 MG/24HR TD PTTW
1.0000 | MEDICATED_PATCH | TRANSDERMAL | 4 refills | Status: AC
  Filled 2023-12-13 – 2024-01-14 (×2): qty 24, 84d supply, fill #0
  Filled 2024-04-07: qty 24, 84d supply, fill #1
  Filled 2024-07-05: qty 24, 84d supply, fill #2

## 2023-12-14 ENCOUNTER — Other Ambulatory Visit (HOSPITAL_COMMUNITY): Payer: Self-pay

## 2023-12-14 ENCOUNTER — Other Ambulatory Visit (HOSPITAL_BASED_OUTPATIENT_CLINIC_OR_DEPARTMENT_OTHER): Payer: Self-pay | Admitting: Obstetrics and Gynecology

## 2023-12-14 DIAGNOSIS — E2839 Other primary ovarian failure: Secondary | ICD-10-CM

## 2023-12-15 ENCOUNTER — Encounter (INDEPENDENT_AMBULATORY_CARE_PROVIDER_SITE_OTHER): Payer: Self-pay | Admitting: Ophthalmology

## 2023-12-15 ENCOUNTER — Other Ambulatory Visit (HOSPITAL_COMMUNITY): Payer: Self-pay

## 2023-12-15 ENCOUNTER — Ambulatory Visit (INDEPENDENT_AMBULATORY_CARE_PROVIDER_SITE_OTHER): Payer: 59 | Admitting: Ophthalmology

## 2023-12-15 DIAGNOSIS — H35033 Hypertensive retinopathy, bilateral: Secondary | ICD-10-CM

## 2023-12-15 DIAGNOSIS — H3562 Retinal hemorrhage, left eye: Secondary | ICD-10-CM | POA: Diagnosis not present

## 2023-12-15 DIAGNOSIS — I10 Essential (primary) hypertension: Secondary | ICD-10-CM

## 2023-12-15 DIAGNOSIS — Z9889 Other specified postprocedural states: Secondary | ICD-10-CM

## 2023-12-19 ENCOUNTER — Encounter (INDEPENDENT_AMBULATORY_CARE_PROVIDER_SITE_OTHER): Payer: Self-pay | Admitting: Ophthalmology

## 2023-12-24 ENCOUNTER — Ambulatory Visit (HOSPITAL_BASED_OUTPATIENT_CLINIC_OR_DEPARTMENT_OTHER)
Admission: RE | Admit: 2023-12-24 | Discharge: 2023-12-24 | Disposition: A | Discharge status: Home / Self Care | Source: Ambulatory Visit | Attending: Obstetrics and Gynecology | Admitting: Obstetrics and Gynecology

## 2023-12-24 DIAGNOSIS — Z780 Asymptomatic menopausal state: Secondary | ICD-10-CM | POA: Diagnosis not present

## 2023-12-24 DIAGNOSIS — E2839 Other primary ovarian failure: Secondary | ICD-10-CM | POA: Diagnosis not present

## 2024-01-02 ENCOUNTER — Other Ambulatory Visit: Payer: Self-pay | Admitting: Medical

## 2024-01-03 ENCOUNTER — Other Ambulatory Visit (HOSPITAL_COMMUNITY): Payer: Self-pay

## 2024-01-03 ENCOUNTER — Other Ambulatory Visit (HOSPITAL_BASED_OUTPATIENT_CLINIC_OR_DEPARTMENT_OTHER): Payer: Self-pay

## 2024-01-03 MED ORDER — ONDANSETRON HCL 4 MG PO TABS
4.0000 mg | ORAL_TABLET | Freq: Three times a day (TID) | ORAL | 0 refills | Status: DC | PRN
  Filled 2024-01-03: qty 90, 30d supply, fill #0

## 2024-01-14 ENCOUNTER — Other Ambulatory Visit: Payer: Self-pay

## 2024-01-14 ENCOUNTER — Other Ambulatory Visit: Payer: Self-pay | Admitting: Medical

## 2024-01-14 ENCOUNTER — Other Ambulatory Visit (HOSPITAL_COMMUNITY): Payer: Self-pay

## 2024-01-14 MED ORDER — MONTELUKAST SODIUM 10 MG PO TABS
10.0000 mg | ORAL_TABLET | Freq: Every evening | ORAL | 2 refills | Status: AC
  Filled 2024-01-14 – 2024-01-20 (×2): qty 90, 90d supply, fill #0
  Filled 2024-04-20: qty 90, 90d supply, fill #1

## 2024-01-14 MED ORDER — TRAZODONE HCL 50 MG PO TABS
25.0000 mg | ORAL_TABLET | Freq: Every evening | ORAL | 0 refills | Status: AC | PRN
  Filled 2024-01-14: qty 90, 90d supply, fill #0

## 2024-01-17 ENCOUNTER — Other Ambulatory Visit: Payer: Self-pay

## 2024-01-17 ENCOUNTER — Encounter: Payer: Self-pay | Admitting: Pharmacist

## 2024-01-20 ENCOUNTER — Other Ambulatory Visit: Payer: Self-pay

## 2024-01-24 ENCOUNTER — Ambulatory Visit (INDEPENDENT_AMBULATORY_CARE_PROVIDER_SITE_OTHER): Admitting: Medical

## 2024-01-24 ENCOUNTER — Other Ambulatory Visit (HOSPITAL_COMMUNITY): Payer: Self-pay

## 2024-01-24 ENCOUNTER — Other Ambulatory Visit: Payer: Self-pay

## 2024-01-24 VITALS — BP 110/70 | HR 73 | Temp 98.0°F | Ht 69.25 in | Wt 239.4 lb

## 2024-01-24 DIAGNOSIS — R7301 Impaired fasting glucose: Secondary | ICD-10-CM | POA: Diagnosis not present

## 2024-01-24 DIAGNOSIS — U071 COVID-19: Secondary | ICD-10-CM

## 2024-01-24 DIAGNOSIS — Z131 Encounter for screening for diabetes mellitus: Secondary | ICD-10-CM

## 2024-01-24 DIAGNOSIS — J4530 Mild persistent asthma, uncomplicated: Secondary | ICD-10-CM | POA: Diagnosis not present

## 2024-01-24 DIAGNOSIS — K760 Fatty (change of) liver, not elsewhere classified: Secondary | ICD-10-CM | POA: Diagnosis not present

## 2024-01-24 DIAGNOSIS — K219 Gastro-esophageal reflux disease without esophagitis: Secondary | ICD-10-CM

## 2024-01-24 DIAGNOSIS — E785 Hyperlipidemia, unspecified: Secondary | ICD-10-CM | POA: Diagnosis not present

## 2024-01-24 DIAGNOSIS — E559 Vitamin D deficiency, unspecified: Secondary | ICD-10-CM | POA: Diagnosis not present

## 2024-01-24 DIAGNOSIS — F419 Anxiety disorder, unspecified: Secondary | ICD-10-CM

## 2024-01-24 DIAGNOSIS — Z0000 Encounter for general adult medical examination without abnormal findings: Secondary | ICD-10-CM | POA: Diagnosis not present

## 2024-01-24 LAB — POCT INFLUENZA A/B
Influenza A, POC: NEGATIVE
Influenza B, POC: NEGATIVE

## 2024-01-24 LAB — CBC WITH DIFFERENTIAL/PLATELET

## 2024-01-24 LAB — POC COVID19 BINAXNOW: SARS Coronavirus 2 Ag: POSITIVE — AB

## 2024-01-24 MED ORDER — PAXLOVID (300/100) 20 X 150 MG & 10 X 100MG PO TBPK
3.0000 | ORAL_TABLET | Freq: Two times a day (BID) | ORAL | 0 refills | Status: AC
  Filled 2024-01-24: qty 30, 5d supply, fill #0

## 2024-01-24 MED ORDER — PAXLOVID (300/100) 20 X 150 MG & 10 X 100MG PO TBPK: 3.0000 | ORAL_TABLET | Freq: Two times a day (BID) | ORAL | 0 refills | Status: DC

## 2024-01-24 NOTE — Progress Notes (Signed)
 Subjective:   HPI  Michelle Griffin is a 53 y.o. female who presents for Chief Complaint  Patient presents with   Annual Exam    Fasting cpe, also sick-sore throat, sneezing, body aches- symptoms started Friday    Patient Care Team: Cordarius Benning, Alm RAMAN, PA-C as PCP - [facility] Sees dentist Sees eye doctor Dr. Oliva Boots, GI Dr. Nathanel Bunker, [facility] OB/Gyn Dr. Signe Pottier, allergist   Concerns: Here for a physical today but she also has an acute illness symptoms.  She notes 3-day history of sneezing, sore throat, headache, body aches, chills, does not feel well.  No nausea vomiting diarrhea.  No shortness of breath or wheezing.  Using nothing for symptoms so far.  Her son and his wife were sick last week and around her.  She reports compliance with her medications in general  She is compliant with her cholesterol medication.  She is compliant with her vitamin D  supplement  She is compliant with her Zoloft  and Rexulti  for mood  She is using Wegovy  every 2 weeks on average because it is too expensive to afford otherwise   Reviewed their medical, surgical, family, social, medication, and allergy history and updated chart as appropriate.   Past Medical History:  Diagnosis Date   Adenocarcinoma in situ of cervix    Allergy    sees Dr. Pottier   Anemia    hx/o iron  deficiency due to heavy periods   Asthma    exercise induced   Bursitis    Cancer (HCC)    cervical   COVID-19 05/26/2019   fatigue, h/a, loss of taste and smell, fatige x 1 month, loss of taste x 5 months all symptoms rsolved   DDD (degenerative disc disease), lumbar    Fatty liver    GERD (gastroesophageal reflux disease)    Dr. Boots   H/O mammogram    Hiatal hernia    small per EGD, Dr. Boots   Hyperlipidemia 2012   was on pravastatin  prior   Migraine    prior eval with neurologist, Dr. Lindy   Overactive bladder    PMDD (premenstrual dysphoric  disorder)    Pyelonephritis    age 83yo   Uterine infection 2015   Vitamin D  deficiency    Wears glasses     Family History  Problem Relation Age of Onset   Cataracts Mother    Lupus Mother    Hypertension Mother    Hyperlipidemia Mother    Migraines Mother    Kidney disease Mother    Hypertension Father    Diabetes Father    Hyperlipidemia Father    Depression Father    Anxiety disorder Father    Dementia Father    Obesity Sister    Hypertension Sister    Diabetes Sister    Ovarian cysts Daughter    Heart disease Paternal Uncle        CABG   Hyperlipidemia Paternal Uncle    Cancer Paternal Uncle        multiple with colon cancer   Aplastic anemia Maternal Grandfather    AAA (abdominal aortic aneurysm) Maternal Grandfather    Cancer Cousin        breast   Cancer Cousin        ovarian   Stroke Neg Hx      Current Outpatient Medications:    albuterol  (VENTOLIN  HFA) 108 (90 Base) MCG/ACT inhaler, Inhale 1-2 puffs into the lungs every 4-6  hours as needed for cough or wheeze, Disp: 6.7 g, Rfl: 2   azelastine  (OPTIVAR ) 0.05 % ophthalmic solution, Instill 1 drop into the affected eye(s) twice daily as directed, Disp: 6 mL, Rfl: 3   Azelastine  HCl 137 MCG/SPRAY SOLN, Place 1-2 sprays into both nostrils 2 (two) times daily., Disp: 90 mL, Rfl: 3   brexpiprazole  (REXULTI ) 2 MG TABS tablet, Take 1 tablet (2 mg total) by mouth daily., Disp: 90 tablet, Rfl: 0   EPINEPHrine  0.3 mg/0.3 mL IJ SOAJ injection, Inject as directed as needed for systemic reaction, Disp: 2 each, Rfl: 1   Ergocalciferol  (VITAMIN D2) 50 MCG (2000 UT) TABS, Take 1 tablet by mouth daily., Disp: 90 tablet, Rfl: 3   estradiol  (VIVELLE -DOT) 0.1 MG/24HR patch, Place 1 patch (0.1 mg total) onto the skin 2 (two) times a week., Disp: 24 patch, Rfl: 4   fluticasone  (FLONASE ) 50 MCG/ACT nasal spray, Place 1-2 sprays into both nostrils 1 (one) to 2 (two) times daily as directed., Disp: 48 g, Rfl: 3   levocetirizine  (XYZAL ) 5 MG tablet, Take 1 tablet (5 mg total) by mouth every evening., Disp: 90 tablet, Rfl: 3   meloxicam  (MOBIC ) 15 MG tablet, Take 1 tablet (15 mg total) by mouth daily., Disp: 30 tablet, Rfl: 0   montelukast  (SINGULAIR ) 10 MG tablet, Take 1 tablet (10 mg total) by mouth every evening., Disp: 90 tablet, Rfl: 2   nirmatrelvir /ritonavir  (PAXLOVID , 300/100,) 20 x 150 MG & 10 x 100MG  TBPK, Take 3 tablets by mouth in the morning and at bedtime for 5 days., Disp: 30 tablet, Rfl: 0   ondansetron  (ZOFRAN ) 4 MG tablet, Take 1 tablet (4 mg total) by mouth every 8 (eight) hours as needed for nausea or vomiting., Disp: 90 tablet, Rfl: 0   pantoprazole  (PROTONIX ) 40 MG tablet, Take 1 tablet (40 mg total) by mouth daily., Disp: 90 tablet, Rfl: 1   Probiotic Product (RESTORA) CAPS, Take 1 capsule by mouth daily., Disp: 90 capsule, Rfl: 3   rosuvastatin  (CRESTOR ) 10 MG tablet, Take 1 tablet (10 mg total) by mouth daily., Disp: 90 tablet, Rfl: 1   Semaglutide -Weight Management (WEGOVY ) 1.7 MG/0.75ML SOAJ, Inject 1.7 mg into the skin once a week., Disp: 3 mL, Rfl: 2   sertraline  (ZOLOFT ) 100 MG tablet, Take 1 tablet (100 mg total) by mouth daily., Disp: 90 tablet, Rfl: 1   traZODone  (DESYREL ) 50 MG tablet, Take 0.5-1 tablets (25-50 mg total) by mouth at bedtime as needed for sleep., Disp: 90 tablet, Rfl: 0   Blood Pressure Monitoring (OMRON 3 SERIES BP MONITOR) DEVI, Use as directed, Disp: 1 each, Rfl: 0  Allergies  Allergen Reactions   Sulfa Antibiotics Hives   Ambien  [Zolpidem ]     Nightmares/vivid dreams   Percocet [Oxycodone -Acetaminophen ] Itching    Oxycodone   makes me climb the wall, itching     Review of Systems  Constitutional:  Positive for chills and malaise/fatigue. Negative for fever and weight loss.  HENT:  Positive for congestion. Negative for ear pain, hearing loss, sore throat and tinnitus.   Eyes:  Negative for blurred vision, pain and redness.  Respiratory:  Negative for cough,  hemoptysis and shortness of breath.   Cardiovascular:  Negative for chest pain, palpitations, orthopnea, claudication and leg swelling.  Gastrointestinal:  Negative for abdominal pain, blood in stool, constipation, diarrhea, nausea and vomiting.  Genitourinary:  Negative for dysuria, flank pain, frequency, hematuria and urgency.  Musculoskeletal:  Negative for back pain, falls, joint pain,  myalgias and neck pain.  Skin:  Negative for itching and rash.  Neurological:  Negative for dizziness, tingling, speech change, weakness and headaches.  Endo/Heme/Allergies:  Negative for polydipsia. Does not bruise/bleed easily.  Psychiatric/Behavioral:  Negative for depression and memory loss. The patient is not nervous/anxious and does not have insomnia.         01/24/2024    9:11 AM 07/06/2022   10:59 AM 01/20/2022   10:25 AM 08/28/2021    8:25 AM 04/30/2021    9:08 AM  Depression screen PHQ 2/9  Decreased Interest 0 0 0 0 0  Down, Depressed, Hopeless 0 0 0 0 0  PHQ - 2 Score 0 0 0 0 0       Objective:  BP 110/70   Pulse 73   Temp 98 F (36.7 C)   Ht 5' 9.25 (1.759 m)   Wt 239 lb 6.4 oz (108.6 kg)   LMP 03/16/2015   SpO2 98%   BMI 35.10 kg/m    BP Readings from Last 3 Encounters:  01/24/24 110/70  10/21/23 112/68  08/30/23 122/74   Wt Readings from Last 3 Encounters:  01/24/24 239 lb 6.4 oz (108.6 kg)  10/21/23 246 lb 12.8 oz (111.9 kg)  08/30/23 253 lb (114.8 kg)    General appearance: alert, no distress, WD/WN, Caucasian female, somewhat ill appearing Skin: no worrisome lesions, tattoo left medial foot along the sole of the foot HEENT: normocephalic, conjunctiva/corneas normal, sclerae anicteric, PERRLA, EOMi, nares patent, no discharge or erythema, pharynx with mild erytehma Oral cavity: MMM, tongue normal, teeth normal Neck: supple, no lymphadenopathy, no thyromegaly, no masses, no bruits Chest: non tender, normal shape and expansion Heart: RRR, normal S1, S2, no  murmurs Lungs: CTA bilaterally, no wheezes, rhonchi, or rales Abdomen: +bs, soft, non tender, non distended, no masses, no hepatomegaly, no splenomegaly, no bruits Back: normal ROM, no scoliosis Musculoskeletal: nontender and normal ROM,  UE and LE, limited exam  Extremities: no edema, no cyanosis, no clubbing Pulses: 2+ symmetric, upper and lower extremities, normal cap refill Neurological: alert, oriented x 3, CN2-12 intact, strength normal upper extremities and lower extremities, sensation normal throughout, DTRs 2+ throughout, no cerebellar signs, gait normal Psychiatric: normal affect, behavior normal, pleasant  Breast/gyn - deferred to gynecology     Assessment and Plan :   Encounter Diagnoses  Name Primary?   Encounter for health maintenance examination in adult Yes   COVID-19 virus infection    Screening for diabetes mellitus    Vitamin D  deficiency    Impaired fasting blood sugar    Hyperlipidemia, unspecified hyperlipidemia type    Fatty liver    Mild persistent asthma without complication    Gastroesophageal reflux disease, unspecified whether esophagitis present    Anxiety      This visit was a preventative care visit, also known as wellness visit or routine physical.   Topics typically include healthy lifestyle, diet, exercise, preventative care, vaccinations, sick and well care, proper use of emergency dept and after hours care, as well as other concerns.     Recommendations: Continue to return yearly for your annual wellness and preventative care visits.  This gives us  a chance to discuss healthy lifestyle, exercise, vaccinations, review your chart record, and perform screenings where appropriate.  I recommend you see your eye doctor yearly for routine vision care.  I recommend you see your dentist yearly for routine dental care including hygiene visits twice yearly.  See your gynecologist yearly for routine  gynecological care.   Vaccination recommendations  were reviewed Immunization History  Administered Date(s) Administered   Hepatitis B 12/05/1973, 01/14/1994, 06/30/1994   INFLUENZA, HIGH DOSE SEASONAL PF 03/27/2019, 03/26/2020   Influenza Split 03/11/2015, 03/21/2021   Influenza, Seasonal, Injecte, Preservative Fre 02/08/2023   Influenza-Unspecified 02/10/2004, 02/03/2013, 03/11/2015, 02/29/2016, 03/10/2016, 03/11/2017, 03/12/2017, 03/21/2018   MMR 09/16/1973, 06/11/2006   PFIZER(Purple Top)SARS-COV-2 Vaccination 07/28/2019, 08/18/2019, 03/15/2020, 03/26/2020   Pneumococcal Polysaccharide-23 02/17/2016, 11/05/2020   Rubella 12/05/1993, 12/05/1993   Td 06/01/1990, 05/12/2001, 06/16/2018   Tdap 06/14/2007, 09/04/2022   Tetanus 06/01/2008   Zoster Recombinant(Shingrix ) 01/22/2023, 06/10/2023   I recommend a yearly flu shot in the fall.  Screening for cancer: Colon cancer screening: I reviewed your colonoscopy on file that is up to date from 09/2020. Repeat in 2027.  Breast cancer screening: You should perform a self breast exam monthly.   We reviewed recommendations for regular mammograms and breast cancer screening.  Cervical cancer screening: We reviewed recommendations for pap smear screening.  S/p hysterectomy  Skin cancer screening: Check your skin regularly for new changes, growing lesions, or other lesions of concern Come in for evaluation if you have skin lesions of concern.  Lung cancer screening: If you have a greater than 20 pack year history of tobacco use, then you may qualify for lung cancer screening with a chest CT scan.   Please call your insurance company to inquire about coverage for this test.  We currently don't have screenings for other cancers besides breast, cervical, colon, and lung cancers.  If you have a strong family history of cancer or have other cancer screening concerns, please let me know.    Bone health: Get at least 150 minutes of aerobic exercise weekly Get weight bearing exercise at least  once weekly Bone density test:  A bone density test is an imaging test that uses a type of X-ray to measure the amount of calcium  and other minerals in your bones. The test may be used to diagnose or screen you for a condition that causes weak or thin bones (osteoporosis), predict your risk for a broken bone (fracture), or determine how well your osteoporosis treatment is working. The bone density test is recommended for females 65 and older, or females or males <65 if certain risk factors such as thyroid  disease, long term use of steroids such as for asthma or rheumatological issues, vitamin D  deficiency, estrogen deficiency, family history of osteoporosis, self or family history of fragility fracture in first degree relative.  Bone density test 11/2023 normal   Heart health: Get at least 150 minutes of aerobic exercise weekly Limit alcohol It is important to maintain a healthy blood pressure and healthy cholesterol numbers  Heart disease screening: Screening for heart disease includes screening for blood pressure, fasting lipids, glucose/diabetes screening, BMI height to weight ratio, reviewed of smoking status, physical activity, and diet.    Goals include blood pressure 120/80 or less, maintaining a healthy lipid/cholesterol profile, preventing diabetes or keeping diabetes numbers under good control, not smoking or using tobacco products, exercising most days per week or at least 150 minutes per week of exercise, and eating healthy variety of fruits and vegetables, healthy oils, and avoiding unhealthy food choices like fried food, fast food, high sugar and high cholesterol foods.    She reports a normal CT coronary test back in 2020.  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  Back in 2020 she was a test patient when  they were up starting the CT coronary test program.  She does not have a physical copy of the report and there is no dictated report since she was not  officially seen as a patient but she notes the test was normal.  This was per Dr. Maranda, cardiology   Medical care options: I recommend you continue to seek care here first for routine care.  We try really hard to have available appointments Monday through Friday daytime hours for sick visits, acute visits, and physicals.  Urgent care should be used for after hours and weekends for significant issues that cannot wait till the next day.  The emergency department should be used for significant potentially life-threatening emergencies.  The emergency department is expensive, can often have long wait times for less significant concerns, so try to utilize primary care, urgent care, or telemedicine when possible to avoid unnecessary trips to the emergency department.  Virtual visits and telemedicine have been introduced since the pandemic started in 2020, and can be convenient ways to receive medical care.  We offer virtual appointments as well to assist you in a variety of options to seek medical care.    Separate significant issues discussed: Obesity- continue efforts to lose weight through healthy diet and exercise, continue Wegovy  1.7mg   dose for now.  Using this every 2 weeks as she can't afford the weekly dosing  Fatty liver disease- continue efforts with weight loss and low-fat low-cholesterol diet.    Hyperlipidemia- c/t statin Crestor  10 mg daily,  Labs today  Asthma-no recent problems, continue albuterol  prn  Vitamin D  deficiency-continue supplement  Impaired glucose - labs today  Anxiety and depression-she continues on Rexulti , Zoloft   Chronic insomnia-does fine on trazodone   GERD-on Protonix .  Discussed risk and benefits of medication.  Chronic allergies-she continues her allergy medicine as usual   Covid infection, covid illness general recommendations:  I recommend you rest, hydrate well with water and clear fluids throughout the day.  If you feel dry in the mouth, tongue or  feel that you are urinating as much as usual, then increase hydration.  You urine should be like yellow to clear, not dark yellow or darker.     Pain, body aches, or fever: You can use Tylenol  /Acetaminophen  325mg  over the counter for pain or fever, every 4-6 hours  You can use Meloxicam  15mg  you have at home for pain or fever.     Cough and congestion: You can use over the counter Mucinex  DM or Dayquil/Nyquil combo for cough and congestion   Shortness of breath or wheezing: Use your albuterol  inhaler today to help with these symptoms.  You can use 1-2 puffs every 4 hours as needed for wheezing, shortness of breath, chest tightness, or coughing fits.  Caution as this medication can cause jitteriness/shakes.   Nausea:  You can use prescription Zofran  as needed every 4-6 hours for nausea   Antiviral medication: Begin medication Paxlovid  to help reduce your risk of hospitalization or severe illness.  If medicaiton is not available, too expensive or not covered by insurance, then call us  back.   Other supportive measures: I recommend extra vitamins to help your body fight the illness.   Consider over the counter vitamin pack such as EmergenC Immune Plus which contains extra vitamin C, vitamin D  and zinc.     In the next few days, if you are having trouble breathing, if you are very weak, have high fever 103 or higher consistently despite Tylenol , or uncontrollable  nausea and vomiting, then call or go to the emergency department.    If you have other questions or have other symptoms or questions you are concerned about then please make a virtual visit   Covid symptoms such as fatigue and cough can linger over 2 weeks, even after the initial fever, aches, chills, and other initial symptoms.   Self Quarantine: The CDC, Centers for Disease Control has recommended a self quarantine of 5 days from the start of your illness until you are symptom-free including at least 24 hours of no  symptoms including no fever, no shortness of breath, and no body aches and chills, by day 5 before returning to work or general contact with the public.  What does self quarantine mean: avoiding contact with people as much as possible.   Particularly in your house, isolate your self from others in a separate room, wear a mask when possible in the room, particularly if coughing a lot.   Have others bring food, water, medications, etc., to your door, but avoid direct contact with your household contacts during this time to avoid spreading the infection to them.   If you have a separate bathroom and living quarters during the next 2 weeks away from others, that would be preferable.    If you can't completely isolate, then wear a mask, wash hands frequently with soap and water for at least 15 seconds, minimize close contact with others, and have a friend or family member check regularly from a distance to make sure you are not getting seriously worse.     You should not be going out in public, should not be going to stores, to work or other public places until all your symptoms have resolved and at least 5 days + 24 hours of no symptoms at all have transpired.   Ideally you should avoid contact with others for a full 5 days if possible.  One of the goals is to limit spread to high risk people; people that are older and elderly, people with multiple health issues like diabetes, heart disease, lung disease, and anybody that has weakened immune systems such as people with cancer or on immunosuppressive therapy.    Adalene was seen today for annual exam.  Diagnoses and all orders for this visit:  Encounter for health maintenance examination in adult -     CBC with Differential/Platelet -     Comprehensive metabolic panel with GFR -     Lipid panel -     Hemoglobin A1c  COVID-19 virus infection -     POC COVID-19 -     POCT Influenza A/B  Screening for diabetes mellitus -     Hemoglobin A1c  Vitamin D   deficiency  Impaired fasting blood sugar -     Hemoglobin A1c  Hyperlipidemia, unspecified hyperlipidemia type -     Lipid panel  Fatty liver  Mild persistent asthma without complication  Gastroesophageal reflux disease, unspecified whether esophagitis present  Anxiety  Other orders -     nirmatrelvir /ritonavir  (PAXLOVID , 300/100,) 20 x 150 MG & 10 x 100MG  TBPK; Take 3 tablets by mouth in the morning and at bedtime for 5 days.    Follow-up pending labs, yearly for physical

## 2024-01-24 NOTE — Patient Instructions (Signed)
 Covid infection, covid illness general recommendations:  I recommend you rest, hydrate well with water and clear fluids throughout the day.  If you feel dry in the mouth, tongue or feel that you are urinating as much as usual, then increase hydration.  You urine should be like yellow to clear, not dark yellow or darker.     Pain, body aches, or fever: You can use Tylenol  /Acetaminophen  325mg  over the counter for pain or fever, every 4-6 hours  You can use Meloxicam  15mg  you have at home for pain or fever.     Cough and congestion: You can use over the counter Mucinex  DM or Dayquil/Nyquil combo for cough and congestion   Shortness of breath or wheezing: Use your albuterol  inhaler today to help with these symptoms.  You can use 1-2 puffs every 4 hours as needed for wheezing, shortness of breath, chest tightness, or coughing fits.  Caution as this medication can cause jitteriness/shakes.   Nausea:  You can use prescription Zofran  as needed every 4-6 hours for nausea   Antiviral medication: Begin medication Paxlovid  to help reduce your risk of hospitalization or severe illness.  If medicaiton is not available, too expensive or not covered by insurance, then call us  back.   Other supportive measures: I recommend extra vitamins to help your body fight the illness.   Consider over the counter vitamin pack such as EmergenC Immune Plus which contains extra vitamin C, vitamin D  and zinc.     In the next few days, if you are having trouble breathing, if you are very weak, have high fever 103 or higher consistently despite Tylenol , or uncontrollable nausea and vomiting, then call or go to the emergency department.    If you have other questions or have other symptoms or questions you are concerned about then please make a virtual visit   Covid symptoms such as fatigue and cough can linger over 2 weeks, even after the initial fever, aches, chills, and other initial symptoms.   Self  Quarantine: The CDC, Centers for Disease Control has recommended a self quarantine of 5 days from the start of your illness until you are symptom-free including at least 24 hours of no symptoms including no fever, no shortness of breath, and no body aches and chills, by day 5 before returning to work or general contact with the public.  What does self quarantine mean: avoiding contact with people as much as possible.   Particularly in your house, isolate your self from others in a separate room, wear a mask when possible in the room, particularly if coughing a lot.   Have others bring food, water, medications, etc., to your door, but avoid direct contact with your household contacts during this time to avoid spreading the infection to them.   If you have a separate bathroom and living quarters during the next 2 weeks away from others, that would be preferable.    If you can't completely isolate, then wear a mask, wash hands frequently with soap and water for at least 15 seconds, minimize close contact with others, and have a friend or family member check regularly from a distance to make sure you are not getting seriously worse.     You should not be going out in public, should not be going to stores, to work or other public places until all your symptoms have resolved and at least 5 days + 24 hours of no symptoms at all have transpired.   Ideally you  should avoid contact with others for a full 5 days if possible.  One of the goals is to limit spread to high risk people; people that are older and elderly, people with multiple health issues like diabetes, heart disease, lung disease, and anybody that has weakened immune systems such as people with cancer or on immunosuppressive therapy.

## 2024-01-25 ENCOUNTER — Other Ambulatory Visit: Payer: Self-pay | Admitting: Medical

## 2024-01-25 ENCOUNTER — Ambulatory Visit: Payer: Self-pay | Admitting: Medical

## 2024-01-25 ENCOUNTER — Other Ambulatory Visit (HOSPITAL_COMMUNITY): Payer: Self-pay

## 2024-01-25 ENCOUNTER — Other Ambulatory Visit: Payer: Self-pay

## 2024-01-25 LAB — CBC WITH DIFFERENTIAL/PLATELET
Basos: 1
EOS (ABSOLUTE): 0 x10E3/uL (ref 0.0–0.2)
Eos: 0
Hematocrit: 42.8 (ref 34.0–46.6)
Hemoglobin: 14.4 g/dL (ref 11.1–15.9)
Immature Granulocytes: 0
Immature Granulocytes: 0 x10E3/uL (ref 0.0–0.1)
Lymphs: 31
MCH: 30.4 pg (ref 26.6–33.0)
MCHC: 33.6 g/dL (ref 31.5–35.7)
MCV: 90 fL (ref 79–97)
Monocytes Absolute: 0 x10E3/uL (ref 0.0–0.4)
Monocytes Absolute: 0.7 x10E3/uL (ref 0.1–0.9)
Monocytes: 17
Neutrophils Absolute: 1.2 x10E3/uL (ref 0.7–3.1)
Neutrophils Absolute: 2 x10E3/uL (ref 1.4–7.0)
Neutrophils: 51
Platelets: 255 x10E3/uL (ref 150–450)
RBC: 4.74 x10E6/uL (ref 3.77–5.28)
RDW: 13.1 (ref 11.7–15.4)
WBC: 3.9 x10E3/uL (ref 3.4–10.8)

## 2024-01-25 LAB — COMPREHENSIVE METABOLIC PANEL WITH GFR
ALT: 66 IU/L — ABNORMAL HIGH (ref 0–32)
AST: 94 IU/L — ABNORMAL HIGH (ref 0–40)
Albumin: 4.7 g/dL (ref 3.8–4.9)
Alkaline Phosphatase: 146 IU/L — ABNORMAL HIGH (ref 44–121)
BUN/Creatinine Ratio: 11 (ref 9–23)
BUN: 10 mg/dL (ref 6–24)
Bilirubin Total: 0.3 mg/dL (ref 0.0–1.2)
CO2: 21 mmol/L (ref 20–29)
Calcium: 9.8 mg/dL (ref 8.7–10.2)
Chloride: 101 mmol/L (ref 96–106)
Creatinine, Ser: 0.92 mg/dL (ref 0.57–1.00)
Globulin, Total: 2.8 g/dL (ref 1.5–4.5)
Glucose: 94 mg/dL (ref 70–99)
Potassium: 4.1 mmol/L (ref 3.5–5.2)
Sodium: 141 mmol/L (ref 134–144)
Total Protein: 7.5 g/dL (ref 6.0–8.5)
eGFR: 75 mL/min/1.73 (ref >59)

## 2024-01-25 LAB — HEMOGLOBIN A1C
Est. average glucose Bld gHb Est-mCnc: 108 mg/dL
Hgb A1c MFr Bld: 5.4 % (ref 4.8–5.6)

## 2024-01-25 LAB — LIPID PANEL
Chol/HDL Ratio: 2.1 ratio (ref 0.0–4.4)
Cholesterol, Total: 160 mg/dL (ref 100–199)
HDL: 75 mg/dL (ref >39)
LDL Chol Calc (NIH): 68 mg/dL (ref 0–99)
Triglycerides: 91 mg/dL (ref 0–149)
VLDL Cholesterol Cal: 17 mg/dL (ref 5–40)

## 2024-01-25 MED ORDER — BREXPIPRAZOLE 2 MG PO TABS
2.0000 mg | ORAL_TABLET | Freq: Every day | ORAL | 0 refills | Status: AC
  Filled 2024-01-25 – 2024-05-17 (×2): qty 90, 90d supply, fill #0

## 2024-01-25 MED ORDER — WEGOVY 1.7 MG/0.75ML ~~LOC~~ SOAJ
1.7000 mg | SUBCUTANEOUS | 2 refills | Status: DC
  Filled 2024-01-25 – 2024-01-27 (×2): qty 3, 28d supply, fill #0
  Filled 2024-04-04 – 2024-04-06 (×2): qty 3, 28d supply, fill #1
  Filled 2024-06-01 – 2024-06-02 (×2): qty 3, 28d supply, fill #2

## 2024-01-25 MED ORDER — PANTOPRAZOLE SODIUM 40 MG PO TBEC
40.0000 mg | DELAYED_RELEASE_TABLET | Freq: Every day | ORAL | 1 refills | Status: AC
  Filled 2024-01-25 – 2024-03-02 (×2): qty 90, 90d supply, fill #0
  Filled 2024-06-01: qty 90, 90d supply, fill #1

## 2024-01-25 MED ORDER — VITAMIN D2 50 MCG (2000 UT) PO TABS
1.0000 | ORAL_TABLET | Freq: Every day | ORAL | 3 refills | Status: AC
  Filled 2024-01-25 – 2024-06-01 (×2): qty 90, fill #0

## 2024-01-25 MED ORDER — ROSUVASTATIN CALCIUM 10 MG PO TABS
10.0000 mg | ORAL_TABLET | Freq: Every day | ORAL | 1 refills | Status: AC
  Filled 2024-01-25 – 2024-03-13 (×2): qty 90, 90d supply, fill #0
  Filled 2024-06-07: qty 90, 90d supply, fill #1

## 2024-01-25 MED ORDER — SERTRALINE HCL 100 MG PO TABS
100.0000 mg | ORAL_TABLET | Freq: Every day | ORAL | 1 refills | Status: AC
  Filled 2024-01-25 – 2024-03-02 (×2): qty 90, 90d supply, fill #0
  Filled 2024-06-01: qty 90, 90d supply, fill #1

## 2024-01-25 NOTE — Progress Notes (Signed)
 Results through my chart

## 2024-01-27 ENCOUNTER — Other Ambulatory Visit: Payer: Self-pay

## 2024-01-28 ENCOUNTER — Telehealth: Payer: Self-pay | Admitting: Internal Medicine

## 2024-01-28 ENCOUNTER — Encounter: Payer: 59 | Admitting: Medical

## 2024-01-28 NOTE — Telephone Encounter (Signed)
 Copied from CRM [id number]. Topic: Appointments - Appointment Scheduling >> Jan 28, 2024  1:34 PM Debby BROCKS wrote: Patient was advised to schedule lab work within 1 month by PCP. Could not find a lab collect for blood work ordered by Dr. Bulah.

## 2024-01-30 ENCOUNTER — Other Ambulatory Visit: Payer: Self-pay | Admitting: Medical

## 2024-01-30 DIAGNOSIS — R7989 Other specified abnormal findings of blood chemistry: Secondary | ICD-10-CM

## 2024-02-14 ENCOUNTER — Other Ambulatory Visit (HOSPITAL_COMMUNITY): Payer: Self-pay

## 2024-02-18 ENCOUNTER — Other Ambulatory Visit: Payer: Self-pay | Admitting: Medical

## 2024-02-18 ENCOUNTER — Other Ambulatory Visit (HOSPITAL_COMMUNITY): Payer: Self-pay

## 2024-02-18 ENCOUNTER — Other Ambulatory Visit: Payer: Self-pay

## 2024-02-18 MED ORDER — BREXPIPRAZOLE 2 MG PO TABS
2.0000 mg | ORAL_TABLET | Freq: Every day | ORAL | 0 refills | Status: AC
  Filled 2024-02-18: qty 90, 90d supply, fill #0

## 2024-02-21 DIAGNOSIS — J4520 Mild intermittent asthma, uncomplicated: Secondary | ICD-10-CM | POA: Diagnosis not present

## 2024-02-21 DIAGNOSIS — J3089 Other allergic rhinitis: Secondary | ICD-10-CM | POA: Diagnosis not present

## 2024-02-21 DIAGNOSIS — J301 Allergic rhinitis due to pollen: Secondary | ICD-10-CM | POA: Diagnosis not present

## 2024-02-21 DIAGNOSIS — H1045 Other chronic allergic conjunctivitis: Secondary | ICD-10-CM | POA: Diagnosis not present

## 2024-03-02 ENCOUNTER — Other Ambulatory Visit (HOSPITAL_COMMUNITY): Payer: Self-pay

## 2024-03-02 ENCOUNTER — Other Ambulatory Visit: Payer: Self-pay

## 2024-03-09 ENCOUNTER — Encounter: Payer: Self-pay | Admitting: Sports Medicine

## 2024-03-09 ENCOUNTER — Other Ambulatory Visit: Payer: Self-pay

## 2024-03-09 ENCOUNTER — Ambulatory Visit: Admitting: Sports Medicine

## 2024-03-09 ENCOUNTER — Other Ambulatory Visit (HOSPITAL_COMMUNITY): Payer: Self-pay

## 2024-03-09 DIAGNOSIS — Q6671 Congenital pes cavus, right foot: Secondary | ICD-10-CM | POA: Diagnosis not present

## 2024-03-09 DIAGNOSIS — M722 Plantar fascial fibromatosis: Secondary | ICD-10-CM | POA: Diagnosis not present

## 2024-03-09 DIAGNOSIS — M9262 Juvenile osteochondrosis of tarsus, left ankle: Secondary | ICD-10-CM

## 2024-03-09 MED ORDER — FLUZONE 0.5 ML IM SUSY
0.5000 mL | PREFILLED_SYRINGE | INTRAMUSCULAR | 0 refills | Status: AC
  Filled 2024-03-09: qty 0.5, 1d supply, fill #0

## 2024-03-09 MED ORDER — BETAMETHASONE SOD PHOS & ACET 6 (3-3) MG/ML IJ SUSP
6.0000 mg | INTRAMUSCULAR | Status: AC | PRN
  Administered 2024-03-09: 6 mg via INTRA_ARTICULAR

## 2024-03-09 MED ORDER — LIDOCAINE HCL 1 % IJ SOLN
2.0000 mL | INTRAMUSCULAR | Status: AC | PRN
  Administered 2024-03-09: 2 mL

## 2024-03-09 NOTE — Progress Notes (Signed)
 Patient says that she had good relief from her injection in February, and would like another injection today. She says that after her trip to [place]  in March, her pain began gradually returning. She says that it now feels about the same as it did prior to her injection. She occasionally takes Ibuprofen , but otherwise does not do anything to treat her pain at home. She says that she has not been doing her home exercises. She has another trip planned at the end of the month.

## 2024-03-09 NOTE — Progress Notes (Signed)
 Michelle Griffin - 53 y.o. female MRN [id number]  Date of birth: 07/24/1970  Office Visit Note: Visit Date: 03/09/2024 PCP: Bulah Alm RAMAN, PA-C Referred by: Bulah Alm RAMAN, PA-C  Subjective: Chief Complaint  Patient presents with   Right Foot - Pain   HPI: Michelle Griffin is a pleasant 53 y.o. female who presents today for acute on chronic right foot/PF pain.  Back in February of this year, we did perform ultrasound-guided plantar fascia injection which gave her very good relief and she was able to walk and hike on her trip to [place] .  Eventually over the last few months her pain is gradually started returning.  She is now almost to the point of pain prior to her injection.  She is using ibuprofen  as needed.  She has not been doing her stretches/plantar fascia exercises recently.  She is undergoing another trip at the end of the month and would like to be feeling well for this.   Pertinent ROS were reviewed with the patient and found to be negative unless otherwise specified above in HPI.   Assessment & Plan: Visit Diagnoses:  1. Plantar fasciitis of right foot   2. Haglund deformity of left heel   3. Pes cavus of right foot    Plan: Impression is acute on chronic left foot plantar fasciitis which received essentially 100% pain relief for a number of months after injection back in [place].  She had not had injections prior to that one in that location.  After her pain relief, she does admit she has not been as adherent to her home exercises and has not been doing these at all recently.  Given her reexacerbation, she is interested in repeat injection.  I did discuss limiting cumulative injections in the long run to prevent fat pad atrophy, she is agreeable and understanding to this.  Through shared decision making, we did proceed with ultrasound-guided plantar fascia injection, patient tolerated well.  Advised on postinjection protocol, may use ice as well as Tylenol  in the short-term.   Starting next week after period of modified rest, I would like her to resume her plantar fascia strengthening, stretching and stability exercises about the ankle at least 2-3 times weekly more so for maintenance and prevention.  She will wear supportive shoe/footwear to help support her pes cavus.  She does have a Haglund deformity both on exam and her previous x-rays that is bothering her less so.  She may see myself or Dr. Harden for this going forward.  Follow-up PRN.  I did discuss that in the future, she did respond positively to extracorporeal shockwave therapy, we could consider this going forward if she starts having reaccumulation of her pain.  Follow-up: Return if symptoms worsen or fail to improve.   Meds & Orders: No orders of the defined types were placed in this encounter.   Orders Placed This Encounter  Procedures   Foot Inj   US  Guided Needle Placement - No Linked Charges     Procedures: Foot Inj: left plantar fascia  Date/Time: 03/09/2024 3:54 PM  Performed by: Burnetta Brunet, DO Authorized by: Burnetta Brunet, DO   Consent Given by:  Patient Site marked: the procedure site was marked   Timeout: prior to procedure the correct patient, procedure, and site was verified   Indications:  Fasciitis and pain Condition: Plantar Fasciitis   Location: left plantar fascia muscle   Prep: patient was prepped and draped in usual sterile fashion   Needle  Size:  22 G Medications:  2 mL lidocaine  1 %; 6 mg betamethasone  acetate-betamethasone  sodium phosphate  6 (3-3) MG/ML Patient Tolerance:  Patient tolerated the procedure well with no immediate complications  Procedure: Plantar fasciitis injection, Left After discussion on R/B/I and informed verbal consent was obtained, a timeout was performed. Patient was lying prone on exam table. Medial aspect of inferior calcaneus was identified with point of maximum tenderness and area was cleaned with chloraprep and alcohol swabs . Then utilizing  ultrasound guidance via an in-plane approach, the patient's plantar fasica of left foot was injected with 2:1 lidocaine :celestone  with multiple needle fenestrations from a medial to lateral in-plane approach. Patient tolerated procedure well without immediate complications.          Clinical History: No specialty comments available.  She reports that she has never smoked. She has never used smokeless tobacco.  Recent Labs    06/11/23 0000 10/21/23 0910 01/24/24 1033  HGBA1C 5.6 5.8* 5.4    Objective:   Vital Signs: LMP 03/16/2015   Physical Exam  Gen: Well-appearing, in no acute distress; non-toxic CV: Well-perfused. Warm.  Resp: Breathing unlabored on room air; no wheezing. Psych: Fluid speech in conversation; appropriate affect; normal thought process  Ortho Exam - Left foot/ankle: + TTP over the medial band of the insertional plantar fascia.  There is also a degree of pain palpating over the superior calcaneus with Haglund deformity noted.  No specific retrocalcaneal bursitis.  There is pes cavus noted of bilateral feet.  Imaging: No results found.  Past Medical/Family/Surgical/Social History: Medications & Allergies reviewed per EMR, new medications updated. Patient Active Problem List   Diagnosis Date Noted   Trochanteric bursitis of right hip 10/21/2023   High risk medication use 04/30/2021   Anxiety 04/30/2021   Encounter for health maintenance examination in adult 11/05/2020   Vaccine counseling 11/05/2020   Need for pneumococcal vaccination 11/05/2020   Screening for diabetes mellitus 11/05/2020   Gastroesophageal reflux disease 05/29/2020   S/P hysterectomy 05/29/2020   S/P laparoscopic assisted vaginal hysterectomy (LAVH) 03/20/2020   Insomnia 09/08/2019   Need for Td vaccine 02/17/2016   Obesity with serious comorbidity 02/17/2016   Fatty liver 02/13/2015   Vitamin D  deficiency 02/13/2015   PMDD (premenstrual dysphoric disorder) 02/13/2015    Hyperlipidemia 02/13/2015   OAB (overactive bladder) 02/13/2015   Rhinitis, allergic 02/13/2015   Asthma, mild persistent 02/13/2015   Screening for lipid disorders 02/13/2015   Impaired fasting blood sugar 02/13/2015   DEGENERATIVE DISC DISEASE, LUMBAR SPINE 11/13/2008   Past Medical History:  Diagnosis Date   Adenocarcinoma in situ of cervix    Allergy    sees Dr. Frutoso   Anemia    hx/o iron  deficiency due to heavy periods   Asthma    exercise induced   Bursitis    Cancer (HCC)    cervical   COVID-19 05/26/2019   fatigue, h/a, loss of taste and smell, fatige x 1 month, loss of taste x 5 months all symptoms rsolved   DDD (degenerative disc disease), lumbar    Fatty liver    GERD (gastroesophageal reflux disease)    Dr. Rosalie   H/O mammogram    Hiatal hernia    small per EGD, Dr. Rosalie   Hyperlipidemia 2012   was on pravastatin  prior   Migraine    prior eval with neurologist, Dr. Lindy   Overactive bladder    PMDD (premenstrual dysphoric disorder)    Pyelonephritis    age 55yo  Uterine infection 2015   Vitamin D  deficiency    Wears glasses    Family History  Problem Relation Age of Onset   Cataracts Mother    Lupus Mother    Hypertension Mother    Hyperlipidemia Mother    Migraines Mother    Kidney disease Mother    Hypertension Father    Diabetes Father    Hyperlipidemia Father    Depression Father    Anxiety disorder Father    Dementia Father    Obesity Sister    Hypertension Sister    Diabetes Sister    Ovarian cysts Daughter    Heart disease Paternal Uncle        CABG   Hyperlipidemia Paternal Uncle    Cancer Paternal Uncle        multiple with colon cancer   Aplastic anemia Maternal Grandfather    AAA (abdominal aortic aneurysm) Maternal Grandfather    Cancer Cousin        breast   Cancer Cousin        ovarian   Stroke Neg Hx    Past Surgical History:  Procedure Laterality Date   CERVICAL CONIZATION W/BX N/A 03/05/2020   Procedure:  CONIZATION CERVIX WITH BIOPSY;  Surgeon: Estelle Service, MD;  Location: [facility];  Service: Gynecology;  Laterality: N/A;   CHOLECYSTECTOMY  2011   [facility]   COLONOSCOPY  2014   repeat 5 years; Dr. Rosalie, baseline   CYSTOSCOPY N/A 03/20/2020   Procedure: CYSTOSCOPY;  Surgeon: Estelle Service, MD;  Location: [facility];  Service: Gynecology;  Laterality: N/A;   ESOPHAGOGASTRODUODENOSCOPY  2014   Dr. Rosalie   FOOT SURGERY Left 2011   morton's neuroma   LAPAROSCOPIC VAGINAL HYSTERECTOMY WITH SALPINGECTOMY Bilateral 03/20/2020   Procedure: LAPAROSCOPIC ASSISTED VAGINAL HYSTERECTOMY WITH SALPINGECTOMY;  Surgeon: Estelle Service, MD;  Location: [facility];  Service: Gynecology;  Laterality: Bilateral;   LASIK  yrs ago   Dr. Osvaldo   LUMBAR DISC SURGERY  07/2013, [facility]   due to herniated disc, Dr. Beuford PUFFER ABLATION  2015   WISDOM TOOTH EXTRACTION  age 6   Social History   Occupational History   Not on file  Tobacco Use   Smoking status: Never   Smokeless tobacco: Never  Vaping Use   Vaping status: Never Used  Substance and Sexual Activity   Alcohol use: Yes    Alcohol/week: 3.0 standard drinks of alcohol    Types: 3 Cans of beer per week    Comment: socially   Drug use: No   Sexual activity: Not Currently    Partners: Male    Comment: partner had vasectomy

## 2024-03-13 ENCOUNTER — Other Ambulatory Visit: Payer: Self-pay | Admitting: Medical

## 2024-03-13 ENCOUNTER — Other Ambulatory Visit: Payer: Self-pay

## 2024-03-13 ENCOUNTER — Other Ambulatory Visit (HOSPITAL_COMMUNITY): Payer: Self-pay

## 2024-03-13 MED ORDER — LEVOCETIRIZINE DIHYDROCHLORIDE 5 MG PO TABS
5.0000 mg | ORAL_TABLET | Freq: Every evening | ORAL | 3 refills | Status: AC
  Filled 2024-03-13: qty 90, 90d supply, fill #0
  Filled 2024-06-07: qty 90, 90d supply, fill #1

## 2024-03-13 MED ORDER — ONDANSETRON HCL 4 MG PO TABS
4.0000 mg | ORAL_TABLET | Freq: Three times a day (TID) | ORAL | 0 refills | Status: AC | PRN
  Filled 2024-03-13: qty 90, 30d supply, fill #0

## 2024-03-14 ENCOUNTER — Other Ambulatory Visit: Payer: Self-pay

## 2024-04-03 ENCOUNTER — Encounter: Payer: Self-pay | Admitting: Radiology

## 2024-04-04 ENCOUNTER — Other Ambulatory Visit (HOSPITAL_COMMUNITY): Payer: Self-pay

## 2024-04-05 ENCOUNTER — Other Ambulatory Visit: Payer: Self-pay

## 2024-04-05 ENCOUNTER — Other Ambulatory Visit (HOSPITAL_COMMUNITY): Payer: Self-pay

## 2024-04-06 ENCOUNTER — Other Ambulatory Visit (HOSPITAL_BASED_OUTPATIENT_CLINIC_OR_DEPARTMENT_OTHER): Payer: Self-pay

## 2024-04-06 ENCOUNTER — Other Ambulatory Visit (HOSPITAL_COMMUNITY): Payer: Self-pay

## 2024-04-06 ENCOUNTER — Other Ambulatory Visit: Payer: Self-pay

## 2024-04-07 ENCOUNTER — Other Ambulatory Visit (HOSPITAL_COMMUNITY): Payer: Self-pay

## 2024-04-07 ENCOUNTER — Other Ambulatory Visit: Payer: Self-pay

## 2024-04-10 ENCOUNTER — Other Ambulatory Visit: Payer: Self-pay

## 2024-04-12 ENCOUNTER — Other Ambulatory Visit: Payer: Self-pay

## 2024-04-20 ENCOUNTER — Other Ambulatory Visit (HOSPITAL_COMMUNITY): Payer: Self-pay

## 2024-05-04 ENCOUNTER — Other Ambulatory Visit (HOSPITAL_COMMUNITY): Payer: Self-pay

## 2024-05-04 ENCOUNTER — Other Ambulatory Visit: Payer: Self-pay | Admitting: Medical

## 2024-05-04 ENCOUNTER — Other Ambulatory Visit: Payer: Self-pay

## 2024-05-04 MED ORDER — OSELTAMIVIR PHOSPHATE 75 MG PO CAPS
75.0000 mg | ORAL_CAPSULE | Freq: Every day | ORAL | 0 refills | Status: DC
  Filled 2024-05-04: qty 10, 10d supply, fill #0

## 2024-05-04 MED ORDER — OSELTAMIVIR PHOSPHATE 75 MG PO CAPS: 75.0000 mg | ORAL_CAPSULE | Freq: Every day | ORAL | 0 refills | Status: AC

## 2024-05-17 ENCOUNTER — Other Ambulatory Visit: Payer: Self-pay

## 2024-05-30 ENCOUNTER — Other Ambulatory Visit (HOSPITAL_COMMUNITY): Payer: Self-pay

## 2024-06-02 ENCOUNTER — Other Ambulatory Visit: Payer: Self-pay

## 2024-06-02 ENCOUNTER — Other Ambulatory Visit (HOSPITAL_COMMUNITY): Payer: Self-pay

## 2024-06-07 ENCOUNTER — Other Ambulatory Visit: Payer: Self-pay

## 2024-06-12 ENCOUNTER — Other Ambulatory Visit: Payer: Self-pay

## 2024-06-12 ENCOUNTER — Other Ambulatory Visit: Payer: Self-pay | Admitting: Medical

## 2024-06-12 MED ORDER — WEGOVY 1.7 MG/0.75ML ~~LOC~~ SOAJ
1.7000 mg | SUBCUTANEOUS | 0 refills | Status: AC
  Filled 2024-06-12 – 2024-07-05 (×2): qty 3, 28d supply, fill #0

## 2024-06-13 ENCOUNTER — Other Ambulatory Visit (HOSPITAL_COMMUNITY): Payer: Self-pay

## 2024-07-05 ENCOUNTER — Other Ambulatory Visit (HOSPITAL_COMMUNITY): Payer: Self-pay

## 2024-07-06 ENCOUNTER — Other Ambulatory Visit: Payer: Self-pay

## 2024-11-02 ENCOUNTER — Encounter

## 2024-11-02 DIAGNOSIS — Z1231 Encounter for screening mammogram for malignant neoplasm of breast: Secondary | ICD-10-CM

## 2024-12-15 ENCOUNTER — Encounter (INDEPENDENT_AMBULATORY_CARE_PROVIDER_SITE_OTHER): Admitting: Ophthalmology

## 2025-02-16 ENCOUNTER — Encounter: Admitting: Medical
